# Patient Record
Sex: Male | Born: 1945 | Race: White | Hispanic: No | Marital: Single | Smoking: Former smoker
Health system: Southern US, Community
[De-identification: ages and names within clinical notes are randomized; demographics above are authoritative.]

## PROBLEM LIST (undated history)

## (undated) DIAGNOSIS — J449 Chronic obstructive pulmonary disease, unspecified: Secondary | ICD-10-CM

## (undated) DIAGNOSIS — Z9889 Other specified postprocedural states: Secondary | ICD-10-CM

## (undated) DIAGNOSIS — N183 Chronic kidney disease, stage 3 unspecified: Secondary | ICD-10-CM

## (undated) DIAGNOSIS — M1990 Unspecified osteoarthritis, unspecified site: Secondary | ICD-10-CM

## (undated) DIAGNOSIS — E0590 Thyrotoxicosis, unspecified without thyrotoxic crisis or storm: Secondary | ICD-10-CM

## (undated) DIAGNOSIS — T8859XA Other complications of anesthesia, initial encounter: Secondary | ICD-10-CM

## (undated) DIAGNOSIS — N400 Enlarged prostate without lower urinary tract symptoms: Secondary | ICD-10-CM

## (undated) DIAGNOSIS — K219 Gastro-esophageal reflux disease without esophagitis: Secondary | ICD-10-CM

## (undated) DIAGNOSIS — T7840XA Allergy, unspecified, initial encounter: Secondary | ICD-10-CM

## (undated) DIAGNOSIS — D649 Anemia, unspecified: Secondary | ICD-10-CM

## (undated) DIAGNOSIS — E785 Hyperlipidemia, unspecified: Secondary | ICD-10-CM

## (undated) DIAGNOSIS — R42 Dizziness and giddiness: Secondary | ICD-10-CM

## (undated) DIAGNOSIS — R112 Nausea with vomiting, unspecified: Secondary | ICD-10-CM

## (undated) DIAGNOSIS — Z87442 Personal history of urinary calculi: Secondary | ICD-10-CM

## (undated) DIAGNOSIS — I2510 Atherosclerotic heart disease of native coronary artery without angina pectoris: Secondary | ICD-10-CM

## (undated) DIAGNOSIS — I119 Hypertensive heart disease without heart failure: Secondary | ICD-10-CM

## (undated) DIAGNOSIS — T4145XA Adverse effect of unspecified anesthetic, initial encounter: Secondary | ICD-10-CM

## (undated) DIAGNOSIS — G629 Polyneuropathy, unspecified: Secondary | ICD-10-CM

## (undated) DIAGNOSIS — R011 Cardiac murmur, unspecified: Secondary | ICD-10-CM

## (undated) DIAGNOSIS — Z9289 Personal history of other medical treatment: Secondary | ICD-10-CM

## (undated) DIAGNOSIS — G4733 Obstructive sleep apnea (adult) (pediatric): Secondary | ICD-10-CM

## (undated) DIAGNOSIS — E119 Type 2 diabetes mellitus without complications: Secondary | ICD-10-CM

## (undated) HISTORY — PX: CARDIAC CATHETERIZATION: SHX172

## (undated) HISTORY — PX: CHOLECYSTECTOMY: SHX55

## (undated) HISTORY — PX: COLONOSCOPY: SHX174

## (undated) HISTORY — DX: Allergy, unspecified, initial encounter: T78.40XA

## (undated) HISTORY — DX: Polyneuropathy, unspecified: G62.9

## (undated) HISTORY — PX: OTHER SURGICAL HISTORY: SHX169

## (undated) HISTORY — DX: Type 2 diabetes mellitus without complications: E11.9

## (undated) HISTORY — DX: Anemia, unspecified: D64.9

---

## 1998-03-18 ENCOUNTER — Emergency Department (HOSPITAL_COMMUNITY): Admission: EM | Admit: 1998-03-18 | Discharge: 1998-03-18 | Payer: Self-pay | Admitting: Emergency Medicine

## 1998-05-07 ENCOUNTER — Ambulatory Visit (HOSPITAL_COMMUNITY): Admission: RE | Admit: 1998-05-07 | Discharge: 1998-05-07 | Payer: Self-pay | Admitting: *Deleted

## 1999-11-19 ENCOUNTER — Emergency Department (HOSPITAL_COMMUNITY): Admission: EM | Admit: 1999-11-19 | Discharge: 1999-11-19 | Payer: Self-pay | Admitting: Emergency Medicine

## 1999-11-19 ENCOUNTER — Encounter: Payer: Self-pay | Admitting: Emergency Medicine

## 1999-11-29 ENCOUNTER — Emergency Department (HOSPITAL_COMMUNITY): Admission: EM | Admit: 1999-11-29 | Discharge: 1999-11-29 | Payer: Self-pay | Admitting: Emergency Medicine

## 2000-12-20 ENCOUNTER — Encounter: Payer: Self-pay | Admitting: Family Medicine

## 2000-12-20 ENCOUNTER — Encounter: Admission: RE | Admit: 2000-12-20 | Discharge: 2000-12-20 | Payer: Self-pay | Admitting: Family Medicine

## 2001-01-10 ENCOUNTER — Encounter: Admission: RE | Admit: 2001-01-10 | Discharge: 2001-01-10 | Payer: Self-pay | Admitting: Family Medicine

## 2001-01-10 ENCOUNTER — Encounter: Payer: Self-pay | Admitting: Family Medicine

## 2001-01-14 ENCOUNTER — Encounter: Payer: Self-pay | Admitting: Family Medicine

## 2001-01-14 ENCOUNTER — Encounter: Admission: RE | Admit: 2001-01-14 | Discharge: 2001-01-14 | Payer: Self-pay | Admitting: Family Medicine

## 2001-02-08 ENCOUNTER — Encounter: Payer: Self-pay | Admitting: Family Medicine

## 2001-02-08 ENCOUNTER — Encounter: Admission: RE | Admit: 2001-02-08 | Discharge: 2001-02-08 | Payer: Self-pay | Admitting: Family Medicine

## 2001-05-03 ENCOUNTER — Encounter: Payer: Self-pay | Admitting: Family Medicine

## 2001-05-03 ENCOUNTER — Encounter: Admission: RE | Admit: 2001-05-03 | Discharge: 2001-05-03 | Payer: Self-pay | Admitting: Family Medicine

## 2002-02-22 ENCOUNTER — Encounter: Admission: RE | Admit: 2002-02-22 | Discharge: 2002-02-22 | Payer: Self-pay | Admitting: Family Medicine

## 2002-02-22 ENCOUNTER — Encounter: Payer: Self-pay | Admitting: Family Medicine

## 2003-02-05 ENCOUNTER — Encounter: Payer: Self-pay | Admitting: Neurology

## 2003-02-05 ENCOUNTER — Ambulatory Visit (HOSPITAL_COMMUNITY): Admission: RE | Admit: 2003-02-05 | Discharge: 2003-02-05 | Payer: Self-pay | Admitting: Neurology

## 2003-08-31 ENCOUNTER — Encounter: Admission: RE | Admit: 2003-08-31 | Discharge: 2003-08-31 | Payer: Self-pay | Admitting: Family Medicine

## 2005-02-10 ENCOUNTER — Ambulatory Visit (HOSPITAL_BASED_OUTPATIENT_CLINIC_OR_DEPARTMENT_OTHER): Admission: RE | Admit: 2005-02-10 | Discharge: 2005-02-10 | Payer: Self-pay | Admitting: *Deleted

## 2005-02-15 ENCOUNTER — Ambulatory Visit: Payer: Self-pay | Admitting: Internal Medicine

## 2005-05-07 ENCOUNTER — Emergency Department (HOSPITAL_COMMUNITY): Admission: EM | Admit: 2005-05-07 | Discharge: 2005-05-07 | Payer: Self-pay | Admitting: Emergency Medicine

## 2005-06-22 ENCOUNTER — Ambulatory Visit: Admission: RE | Admit: 2005-06-22 | Discharge: 2005-06-22 | Payer: Self-pay | Admitting: Family Medicine

## 2005-07-03 ENCOUNTER — Ambulatory Visit (HOSPITAL_COMMUNITY): Admission: RE | Admit: 2005-07-03 | Discharge: 2005-07-04 | Payer: Self-pay | Admitting: Surgery

## 2005-07-03 ENCOUNTER — Encounter (INDEPENDENT_AMBULATORY_CARE_PROVIDER_SITE_OTHER): Payer: Self-pay | Admitting: Specialist

## 2009-02-28 ENCOUNTER — Encounter: Admission: RE | Admit: 2009-02-28 | Discharge: 2009-02-28 | Payer: Self-pay | Admitting: Family Medicine

## 2009-08-01 ENCOUNTER — Emergency Department (HOSPITAL_COMMUNITY): Admission: EM | Admit: 2009-08-01 | Discharge: 2009-08-01 | Payer: Self-pay | Admitting: Emergency Medicine

## 2010-08-09 ENCOUNTER — Encounter: Payer: Self-pay | Admitting: Family Medicine

## 2010-10-05 LAB — CBC
HCT: 38.6 % — ABNORMAL LOW (ref 39.0–52.0)
Hemoglobin: 12.7 g/dL — ABNORMAL LOW (ref 13.0–17.0)
MCHC: 33 g/dL (ref 30.0–36.0)
MCV: 86.6 fL (ref 78.0–100.0)
RBC: 4.45 MIL/uL (ref 4.22–5.81)

## 2010-10-05 LAB — BASIC METABOLIC PANEL
CO2: 30 mEq/L (ref 19–32)
Chloride: 98 mEq/L (ref 96–112)
GFR calc Af Amer: >60 mL/min (ref >60)
Potassium: 4 mEq/L (ref 3.5–5.1)
Sodium: 140 mEq/L (ref 135–145)

## 2010-12-05 NOTE — Procedures (Signed)
NAME:  Shane Watson, Shane Watson NO.:  [id number]   MEDICAL RECORD NO.:  [id number]          PATIENT TYPE:  OUT   LOCATION:  SLEEP CENTER                 FACILITY:  [facility]   PHYSICIAN:  Clinton D. Annamaria Boots, M.D. DATE OF BIRTH:  May 09, 1946   DATE OF STUDY:  02/10/2005                              NOCTURNAL POLYSOMNOGRAM   REFERRING PHYSICIAN:  Nelson Chimes, MD.   INDICATION FOR STUDY:  Hypersomnia with sleep apnea.  Epworth Sleepiness  Score 21/24, BMI 39, weight 290 pounds.   SLEEP ARCHITECTURE:  Total sleep time 334 minutes with sleep efficiency 80%.  Stage I was 6%, stage II 82%, stages III and IV absent, REM 13% of total  sleep time.  Sleep latency 15 minutes, REM latency 107 minutes.  Awake after  sleep onset 17 minutes.  Arousal index 18.   RESPIRATORY DATA:  Respiratory disturbance index (RDI, AHI) 22 obstructive  events per hour indicating moderate obstructive sleep apnea/hypopnea  syndrome before CPAP.  This included 29 hypopneas before CPAP.  All events  were recorded while sleeping supine.  REM RDI 20.  CPAP was titrated to 12  CWP, RDI 2.8 per hour, using a medium Comfort Gel mask with heated  humidifier.   OXYGEN DATA:  Very loud snoring with oxygen desaturation to a nadir of 60%  before CPAP.  After CPAP control, oxygen saturation held 95 to 98% on room  air.   CARDIAC DATA:  Sinus bradycardia at 49 to 66 beats per minute.   MOVEMENT/PARASOMNIA:  A total of 209 limb jerks were recorded of which 22  were associated with arousal or awakening for a periodic limb movement with  arousal index of 3.9 per hour which was increased.  Bathroom x 1.   IMPRESSION/RECOMMENDATIONS:  1.  Moderate obstructive sleep apnea/hypopnea, RDI 22 per hour, with loud      snoring and oxygen desaturation to 60%.      1.  Successful CPAP titration 12 CWP, RDI 2.8 per hour, using a medium          Comfort Gel mask with heated          humidifier.  2.  Periodic limb movement with  arousal, 3.9 per hour.      Clinton D. Annamaria Boots, M.D.  Diplomat    CDY/MEDQ  D:  02/15/2005 13:44:47  T:  02/15/2005 18:36:56  Job:  [id number]   cc:   Nelson Chimes, MD  Fax: [phone]

## 2010-12-05 NOTE — Op Note (Signed)
NAME:  Shane Watson, Shane Watson NO.:  [id number]   MEDICAL RECORD NO.:  [id number]          PATIENT TYPE:  OIB   LOCATION:  [place]                         FACILITY:  [facility]   PHYSICIAN:  Imogene Burn. Georgette Dover, M.D. DATE OF BIRTH:  March 18, 1946   DATE OF PROCEDURE:  07/03/2005  DATE OF DISCHARGE:                                 OPERATIVE REPORT   PREOPERATIVE DIAGNOSES:  Symptomatic cholelithiasis.   POSTOPERATIVE DIAGNOSES:  Symptomatic cholelithiasis.   PROCEDURE:  Laparoscopic cholecystectomy.   SURGEON:  Imogene Burn. Georgette Dover, M.D.   ASSISTANT:  Kathrin Penner, M.D.   ANESTHESIA:  General endotracheal.   INDICATIONS FOR PROCEDURE:  The patient is a 65 year old white male with  obesity who presents with several weeks of episodic right upper quadrant  pain radiating through to his back and lower abdomen. CT scan showed  cholelithiasis but no evidence of cholecystitis. The pain was exacerbated by  eating and was associated with some belching and bloating. He was seen in  consultation and cholecystectomy was recommended.   DESCRIPTION OF PROCEDURE:  The patient was brought to the operating room and  placed in supine position on the operating room table. After an adequate  level of general endotracheal anesthesia was obtained, the patient's abdomen  was prepped with Betadine and draped in a sterile fashion. A timeout was  then taken to ensure the proper patient and proper procedure. The patient's  umbilicus was infiltrated with 0.5% Marcaine. A curvilinear incision was  made just above the umbilicus. Dissection was carried down to the fascia  which was grasped with Kocher clamps. The fascia was opened in a vertical  fashion. The peritoneum was entered bluntly. A stay suture of #0 Vicryl was  placed in pursestring fashion and the Hasson cannula was inserted.  Pneumoperitoneum was obtained by insufflating CO2 maintaining a maximum  pressure of 15 mmHg. A 10 mm port was placed in the  subxiphoid position  under direct vision. Two 5 mm ports were placed in the right upper quadrant.  The patient was rotated in reverse Trendelenburg position, rotated slightly  to his left. The gallbladder was visualized. There was some adherent omentum  to the liver and gallbladder. These adhesions were taken down with the  cautery. The gallbladder was grasped, clamped and elevated over the edge of  the liver. The peritoneum at the hilum of the gallbladder was opened with  cautery. The cystic duct was circumferentially dissected. The cystic artery  was also noted and appeared to be branching across in front of the cystic  duct. This small branch was ligated with clips and divided. The cystic duct  was ligated distally with a clip. A small opening was made on the surface of  the cystic duct. A Reddick catheter was brought through a separate stab  incision. Several attempts were made to pass the catheter but there appeared  to be a large valve that was preventing passage of the catheter. We then  switched to a [facility] cholangiogram catheter. Once again we were unable to pass  this. Good bile flow was noted coming retrograde out of  the cystic duct. The  cystic duct was palpated with the dissector and no stones were noted. The  patient's preoperative liver function tests were normal so the decision was  made to abort the cholangiogram. The cystic duct was then ligated proximally  with 3 clips and then divided. The cystic artery was ligated with clips and  divided. A small posterior branch was also ligated with clips and divided.  Cautery was then used to dissect the gallbladder away from the liver bed.  The gallbladder was placed in an EndoCatch sac and then removed through the  umbilical port. The right upper quadrant was then thoroughly irrigated. No  bleeding or bile leak was noted. The ports were removed under direct vision  and pneumoperitoneum was released. The pursestring suture was used to  close  the umbilical fascia. 4-0 Monocryl was used to close the skin in  subcuticular fashion. Steri-Strips and clean dressings were applied. The  patient was extubated and brought to the recovery room in stable condition.      Imogene Burn. Tsuei, M.D.  Electronically Signed     MKT/MEDQ  D:  07/03/2005  T:  07/03/2005  Job:  [id number]

## 2010-12-18 ENCOUNTER — Inpatient Hospital Stay (HOSPITAL_COMMUNITY)
Admission: RE | Admit: 2010-12-18 | Discharge: 2010-12-19 | DRG: 247 | Disposition: A | Discharge status: Home / Self Care | Payer: Medicare Other | Source: Ambulatory Visit | Attending: Cardiology | Admitting: Cardiology

## 2010-12-18 DIAGNOSIS — E785 Hyperlipidemia, unspecified: Secondary | ICD-10-CM | POA: Diagnosis present

## 2010-12-18 DIAGNOSIS — Z7982 Long term (current) use of aspirin: Secondary | ICD-10-CM

## 2010-12-18 DIAGNOSIS — G4733 Obstructive sleep apnea (adult) (pediatric): Secondary | ICD-10-CM | POA: Diagnosis present

## 2010-12-18 DIAGNOSIS — Z7902 Long term (current) use of antithrombotics/antiplatelets: Secondary | ICD-10-CM

## 2010-12-18 DIAGNOSIS — I2510 Atherosclerotic heart disease of native coronary artery without angina pectoris: Principal | ICD-10-CM | POA: Diagnosis present

## 2010-12-18 DIAGNOSIS — I10 Essential (primary) hypertension: Secondary | ICD-10-CM | POA: Diagnosis present

## 2010-12-18 DIAGNOSIS — E876 Hypokalemia: Secondary | ICD-10-CM | POA: Diagnosis present

## 2010-12-18 HISTORY — PX: OTHER SURGICAL HISTORY: SHX169

## 2010-12-18 LAB — POCT ACTIVATED CLOTTING TIME: Activated Clotting Time: 523 seconds

## 2010-12-18 LAB — CARDIAC PANEL(CRET KIN+CKTOT+MB+TROPI)
Relative Index: 8.6 — ABNORMAL HIGH (ref 0.0–2.5)
Troponin I: 3.65 ng/mL (ref <0.30)

## 2010-12-18 LAB — TROPONIN I: Troponin I: <0.3 ng/mL (ref <0.30)

## 2010-12-19 LAB — CBC
HCT: 36.8 % — ABNORMAL LOW (ref 39.0–52.0)
MCHC: 32.6 g/dL (ref 30.0–36.0)
RDW: 13.9 % (ref 11.5–15.5)

## 2010-12-19 LAB — BASIC METABOLIC PANEL
BUN: 12 mg/dL (ref 6–23)
Calcium: 8.4 mg/dL (ref 8.4–10.5)
GFR calc non Af Amer: 55 mL/min — ABNORMAL LOW (ref >60)
Glucose, Bld: 132 mg/dL — ABNORMAL HIGH (ref 70–99)
Sodium: 139 mEq/L (ref 135–145)

## 2010-12-19 NOTE — Cardiovascular Report (Signed)
NAME:  Shane Watson, Shane Watson NO.:  [id number]  MEDICAL RECORD NO.:  [id number]           PATIENT TYPE:  O  LOCATION:  2922                         FACILITY:  [facility]  PHYSICIAN:  Jerline Pain, MD      DATE OF BIRTH:  [birth year]  DATE OF PROCEDURE:  12/18/2010 DATE OF DISCHARGE:                           CARDIAC CATHETERIZATION   PROCEDURES: 1. Radial artery approach left heart catheterization. 2. Left ventriculogram. 3. Selective coronary angiography.  INDICATIONS:  A 65 year old male with morbid obesity, obstructive sleep apnea who underwent nuclear stress test which demonstrated an inferior wall perfusion defect as well as anteroseptal wall reversible change with possible ischemia.  There was hypokinesis along the inferior wall. Echo EF was normal.  PROCEDURE DETAILS:  Informed consent was obtained.  Risk of stroke, heart attack, death, renal impairment, arterial damage, bleeding were explained to the patient at length.  Ample time for questioning. Alternative treatments were discussed.  A 5-French hydrophilic sheath was inserted in to the right radial artery after 1% lidocaine was used for local anesthesia.  Allen test was normal pre and post procedure.  A Wholey wire was used to traverse the aortic arch.  A Judkins right catheter was performed.  A Judkins left 3.5 catheter was used to cannulate the left main artery.  Multiple views with hand injection of Omnipaque were obtained.  Angled pigtail was used to crossing the left ventricle and hemodynamics were performed.  Hand injection was used for left ventriculogram in the RAO position.  Following the procedure, the sheath was upsized to 6-French for percutaneous intervention.  A 200 mcg of nitroglycerin was administered at that point and initial sheath insertion was verapamil 3 mg.  Heparin 4000 units was administered once catheter was around aortic arch.  FINDINGS: 1. Left main artery was widely patent,  gives rise to the LAD and     circumflex.  No angiographically significant disease. 2. LAD.  There is a long area of significant stenosis of up to 90%     just after the first septal branch in the mid LAD.  There is a     diagonal branch that is proximal to this lesion.  Quite significant     with possible thrombus burden within.  There are 2 focal areas of     90% stenosis.  LAD then wraps around the apex. 3. Circumflex artery.  There are 3 obtuse marginal branches, large     diameter vessels.  No angiographically significant disease.  Right     coronary artery is also with mild disease in the proximal region of     20-30%, gives rise to the posterior descending artery.  Left     ventriculogram was performed and was suboptimal due to hand     injection.  Echocardiogram demonstrated normal EF.  Basal inferior     wall abnormality.  IMPRESSIONS: 1. Severe left anterior descending coronary artery stenosis of 90%     long lesion. 2. Suboptimal left ventriculogram, however, echocardiogram     demonstrated normal function.  Left ventricular end-diastolic     pressure 26 mmHg, systolic  pressure 134/15, aortic pressure is     134/80 with a mean of 102.  PLAN:  Findings discussed with him and Dr. Daneen Schick who will perform percutaneous intervention to the LAD.  We will hold him for observation overnight.  He did have an allergy listed to ASPIRIN, questionable reaction, perhaps tingling of his lips, possibility of angioedema, however, he did receive aspirin earlier this morning and did not have any issues with it currently.  We will go ahead and administer Plavix, however.  While he is here during observation, may go ahead and continue with aspirin.     Jerline Pain, MD     MCS/MEDQ  D:  12/18/2010  T:  12/19/2010  Job:  [id number]  cc:   Marchia Bond, M.D.  Electronically Signed by Candee Furbish MD on 12/19/2010 06:45:25 AM

## 2010-12-22 NOTE — Discharge Summary (Signed)
NAME:  Shane Watson, Shane Watson NO.:  [id number]  MEDICAL RECORD NO.:  [id number]           PATIENT TYPE:  I  LOCATION:  [id number]                         FACILITY:  [facility]  PHYSICIAN:  Jerline Pain, MD      DATE OF BIRTH:  July 16, 1946  DATE OF ADMISSION:  12/18/2010 DATE OF DISCHARGE:  12/19/2010                              DISCHARGE SUMMARY   FINAL DIAGNOSES: 1. Coronary artery disease - cardiac catheterization revealed a severe     90% to 95% sequential, proximal/mid left anterior descending     stenosis.  Dr. Daneen Schick placed two drug-eluting stents to this     region, Resolute stent 3.5 mm x 22 mm and a 3.5 mm x 12 mm.  The     diagonal branch was covered and demonstrated 0-1 TIMI flow     following the procedure and he had postprocedural elevation in     cardiac markers.  He did have some chest discomfort following the     procedure which has slowly weaned away.  He has ambulated the     hallway well without difficulty.  He has seen cardiac     rehabilitation and has also ambulated well without difficulty.  I     have monitored him on telemetry and he demonstrated no evidence of     any adverse arrhythmias.  He has done very well.  No chest pain.     After discussion with he and his wife, we all concluded that it was     safe for him to be discharged home.  Of course if he has any     significant symptoms, he knows to seek medical attention     immediately. 2. Obesity - encourage weight loss.  Morbidly obese.  He will be going     to Cardiac Rehab for dietary counseling. 3. Obstructive sleep apnea - CPAP. 4. Hyperlipidemia - in addition to his WelChol which he is actually     taking for loose stools, I will go ahead and place him on     atorvastatin 40 mg once a day.  We will recheck a cholesterol panel     in 2 months. 5. Hypertension - currently well controlled on current medications. 6. Hypokalemia - his potassiums usually ran approximately 3.4-3.5.  I     will  go ahead and give him potassium supplementation 20 mEq to be     taken at home.  FOLLOWUP:  I will obtain followup with him in 1 week with Bennye Alm and myself.  DISCHARGE LABORATORY DATA:  BUN 12, creatinine 1.3.  Cardiac markers postprocedure were CK of 343, MB of 29.5, and troponin of 3.65. Hemoglobin 12.0, hematocrit 36.8, platelet count 110.  In regard to his platelet count, we will continue to monitor with CBC.  His platelet count prior to procedure was 157.  Certainly, bivalirudin may be the culprit for decreased platelets.  We will recheck.  He is showing no signs of bleeding.  DISCHARGE MEDICATIONS: 1. Atorvastatin 40 mg once a day. 2. Aspirin 325 mg once a day. 3. Clopidogrel 75  mg once a day. 4. Isosorbide mononitrate 30 mg once a day. 5. Metoprolol tartrate 12.5 mg twice a day. 6. Nitroglycerin sublingual as needed chest pain. 7. Potassium chloride 20 mEq once a day. 8. Doxazosin 8 mg at bedtime. 9. Fish oil daily. 10.Losartan 50 mg once a day. 11.Multivitamin once a day. 12.Triamterene/hydrochlorothiazide 75/50 mg once a day. 13.Albuterol inhaler p.r.n. 14.WelChol 625 mg 1 tablet three times a day as previously prescribed.  He did have a prior ASPIRIN allergy listed as angioedema; however, he has tolerated his ASPIRIN for the past 2 days very well without any difficulty.  We should remove the ASPIRIN allergy from his medication allergies.  PHYSICAL EXAMINATION ON DISCHARGE:  A 2+ radial pulse.  No ecchymosis. His heart was regular rate and rhythm without any murmurs, rubs, or gallops.  Lungs were clear to auscultation.  Abdomen was obese. Positive bowel sounds.  He was ambulating well.  No significant edema. Once again, both he and his wife were comfortable with discharge and know to seek medical attention if needed.  Thirty five minutes spent on discharge with med reconciliation, patient instruction, review of medical records.     Jerline Pain,  MD     MCS/MEDQ  D:  12/19/2010  T:  12/20/2010  Job:  [id number]  Electronically Signed by Candee Furbish MD on 12/22/2010 06:24:53 AM

## 2011-01-12 ENCOUNTER — Encounter (HOSPITAL_COMMUNITY)
Admission: RE | Admit: 2011-01-12 | Discharge: 2011-01-12 | Disposition: A | Discharge status: Home / Self Care | Payer: Medicare Other | Source: Ambulatory Visit | Attending: Cardiology | Admitting: Cardiology

## 2011-01-12 DIAGNOSIS — E785 Hyperlipidemia, unspecified: Secondary | ICD-10-CM | POA: Insufficient documentation

## 2011-01-12 DIAGNOSIS — I2510 Atherosclerotic heart disease of native coronary artery without angina pectoris: Secondary | ICD-10-CM | POA: Insufficient documentation

## 2011-01-12 DIAGNOSIS — Z9861 Coronary angioplasty status: Secondary | ICD-10-CM | POA: Insufficient documentation

## 2011-01-12 DIAGNOSIS — Z7982 Long term (current) use of aspirin: Secondary | ICD-10-CM | POA: Insufficient documentation

## 2011-01-12 DIAGNOSIS — I10 Essential (primary) hypertension: Secondary | ICD-10-CM | POA: Insufficient documentation

## 2011-01-12 DIAGNOSIS — G4733 Obstructive sleep apnea (adult) (pediatric): Secondary | ICD-10-CM | POA: Insufficient documentation

## 2011-01-12 DIAGNOSIS — Z5189 Encounter for other specified aftercare: Secondary | ICD-10-CM | POA: Insufficient documentation

## 2011-01-12 DIAGNOSIS — Z7902 Long term (current) use of antithrombotics/antiplatelets: Secondary | ICD-10-CM | POA: Insufficient documentation

## 2011-01-14 ENCOUNTER — Other Ambulatory Visit: Payer: Self-pay | Admitting: Cardiology

## 2011-01-14 ENCOUNTER — Encounter (HOSPITAL_COMMUNITY): Payer: Medicare Other

## 2011-01-16 ENCOUNTER — Encounter (HOSPITAL_COMMUNITY): Payer: Medicare Other

## 2011-01-19 ENCOUNTER — Encounter (HOSPITAL_COMMUNITY): Payer: Medicare Other | Attending: Cardiology

## 2011-01-19 DIAGNOSIS — Z7982 Long term (current) use of aspirin: Secondary | ICD-10-CM | POA: Insufficient documentation

## 2011-01-19 DIAGNOSIS — E785 Hyperlipidemia, unspecified: Secondary | ICD-10-CM | POA: Insufficient documentation

## 2011-01-19 DIAGNOSIS — G4733 Obstructive sleep apnea (adult) (pediatric): Secondary | ICD-10-CM | POA: Insufficient documentation

## 2011-01-19 DIAGNOSIS — I10 Essential (primary) hypertension: Secondary | ICD-10-CM | POA: Insufficient documentation

## 2011-01-19 DIAGNOSIS — I2510 Atherosclerotic heart disease of native coronary artery without angina pectoris: Secondary | ICD-10-CM | POA: Insufficient documentation

## 2011-01-19 DIAGNOSIS — Z9861 Coronary angioplasty status: Secondary | ICD-10-CM | POA: Insufficient documentation

## 2011-01-19 DIAGNOSIS — Z7902 Long term (current) use of antithrombotics/antiplatelets: Secondary | ICD-10-CM | POA: Insufficient documentation

## 2011-01-19 DIAGNOSIS — Z5189 Encounter for other specified aftercare: Secondary | ICD-10-CM | POA: Insufficient documentation

## 2011-01-21 ENCOUNTER — Encounter (HOSPITAL_COMMUNITY): Payer: Medicare Other

## 2011-01-22 NOTE — Cardiovascular Report (Signed)
NAME:  Shane Watson, Shane Watson NO.:  [id number]  MEDICAL RECORD NO.:  [id number]           PATIENT TYPE:  O  LOCATION:  2922                         FACILITY:  [facility]  PHYSICIAN:  Belva Crome, M.D.   DATE OF BIRTH:  1946/02/24  DATE OF PROCEDURE:  12/18/2010 DATE OF DISCHARGE:                           CARDIAC CATHETERIZATION   INDICATION FOR THE PROCEDURE:  Abnormal nuclear study with anteroseptal ischemia and exertional dyspnea.  Catheterization today demonstrated severe complex-appearing mid LAD lesion just beyond the first septal perforator and diagonal obstructing flow by up to 98%.  TIMI flow is grade 2-1/2 to 3.  PROCEDURE PERFORMED:  Overlapping mid left anterior descending drug- eluting stents via the right radial.  DESCRIPTION:  Dr. [name] performed diagnostic catheterization via the right radial.  After identifying the lesion, he upgraded the sheath to 6- [place].  We reviewed the digital images in the catheterization laboratory and decided to proceed with PCI.  An XB LAD 3.0 cm 6-French catheter was used as a guide.  We used an Landscape architect to cross the stenosis in the LAD.  We did balloon inflation with a 2.0 x 15 mm long apex balloon.  Overlapping balloon inflations were performed.  We then used the clear way aspiration catheter to perform suction thrombectomy as the lesion appeared to contain bulky thrombus.  We were not able to extract any thrombus.  We then further predilated using a 3.5 x 15 apex balloon inflated to 10 atmospheres in an overlapping fashion.  We then deployed a 3.5 x 22 mm Resolute drug-eluting stent.  Due to severe stent movement with breathing, the 22-mm long stent moved just at the time that the inflation occurred and caused [place] to miss the distal portion of the lesion.  We then used a second 3.5 x 12 mm Resolute stent to cover the remainder of the lesion overlapping it with the initial stent.  14 atmospheres of pressure  were used to deploy each stent.  We then used cinefluoroscopy to properly place and postdilate using a 4.0 x 20 mm long Quantum [place] balloon.  15 atmospheres of pressure was used.  Two inflations were performed in the distal three-fourth of the stent and in the proximal three-fourth of the stent.  Post dilatation, the lesion was completely ablated with 0% stenosis being noted.  A small second diagonal, which was jailed by the stent had TIMI grade 1 flow and was a source of low-grade continued chest pain even at the completion of the case.  Intravenous nitroglycerin at 10 mcg per minute was started, and there was gradual progressive resolution of discomfort even before the patient could be taken to the holding area.  The patient received 600 mg of Plavix and/or IV Cangrelor as part of the [place] trial.  The arterial sheath was removed and a wristband applied. The antithrombotic for the case was a bolus followed by infusion of Angiomax.  Angiomax was discontinued at the completion of the case.  CONCLUSION:  Successful stenting of the mid LAD from 99% to 0% with TIMI grade 3 flow.  A small second diagonal has  reduced flow due to ostial spasm caused by jailing the branch.  This seems to clinically be improving with resolving chest pain.  TIMI grade 3 flow was noted throughout the LAD otherwise.  PLAN:  Per Dr. [name], aspirin and Plavix should be continued for at least 12 months.     Belva Crome, M.D.     HWS/MEDQ  D:  12/18/2010  T:  12/19/2010  Job:  [id number]  cc:   Azalia Bilis, M.D. Jerline Pain, MD  Electronically Signed by Daneen Schick M.D. on 01/22/2011 01:35:39 PM

## 2011-01-23 ENCOUNTER — Encounter (HOSPITAL_COMMUNITY): Payer: Medicare Other

## 2011-01-26 ENCOUNTER — Encounter (HOSPITAL_COMMUNITY): Payer: Medicare Other

## 2011-01-28 ENCOUNTER — Encounter (HOSPITAL_COMMUNITY): Payer: Medicare Other

## 2011-01-30 ENCOUNTER — Encounter (HOSPITAL_COMMUNITY): Payer: Medicare Other

## 2011-02-02 ENCOUNTER — Encounter (HOSPITAL_COMMUNITY): Payer: Medicare Other

## 2011-02-04 ENCOUNTER — Encounter (HOSPITAL_COMMUNITY): Payer: Medicare Other

## 2011-02-06 ENCOUNTER — Encounter (HOSPITAL_COMMUNITY): Payer: Medicare Other

## 2011-02-09 ENCOUNTER — Encounter (HOSPITAL_COMMUNITY): Payer: Medicare Other

## 2011-02-11 ENCOUNTER — Encounter (HOSPITAL_COMMUNITY): Payer: Medicare Other

## 2011-02-13 ENCOUNTER — Encounter (HOSPITAL_COMMUNITY): Payer: Medicare Other

## 2011-02-16 ENCOUNTER — Encounter (HOSPITAL_COMMUNITY): Payer: Medicare Other

## 2011-02-18 ENCOUNTER — Encounter (HOSPITAL_COMMUNITY): Payer: Medicare Other | Attending: Cardiology

## 2011-02-18 DIAGNOSIS — Z7902 Long term (current) use of antithrombotics/antiplatelets: Secondary | ICD-10-CM | POA: Insufficient documentation

## 2011-02-18 DIAGNOSIS — I10 Essential (primary) hypertension: Secondary | ICD-10-CM | POA: Insufficient documentation

## 2011-02-18 DIAGNOSIS — Z5189 Encounter for other specified aftercare: Secondary | ICD-10-CM | POA: Insufficient documentation

## 2011-02-18 DIAGNOSIS — G4733 Obstructive sleep apnea (adult) (pediatric): Secondary | ICD-10-CM | POA: Insufficient documentation

## 2011-02-18 DIAGNOSIS — E785 Hyperlipidemia, unspecified: Secondary | ICD-10-CM | POA: Insufficient documentation

## 2011-02-18 DIAGNOSIS — I2510 Atherosclerotic heart disease of native coronary artery without angina pectoris: Secondary | ICD-10-CM | POA: Insufficient documentation

## 2011-02-18 DIAGNOSIS — Z7982 Long term (current) use of aspirin: Secondary | ICD-10-CM | POA: Insufficient documentation

## 2011-02-18 DIAGNOSIS — Z9861 Coronary angioplasty status: Secondary | ICD-10-CM | POA: Insufficient documentation

## 2011-02-20 ENCOUNTER — Encounter (HOSPITAL_COMMUNITY): Payer: Medicare Other

## 2011-02-23 ENCOUNTER — Encounter (HOSPITAL_COMMUNITY): Payer: Medicare Other

## 2011-02-25 ENCOUNTER — Encounter (HOSPITAL_COMMUNITY): Payer: Medicare Other

## 2011-02-27 ENCOUNTER — Encounter (HOSPITAL_COMMUNITY): Payer: Medicare Other

## 2011-03-02 ENCOUNTER — Encounter (HOSPITAL_COMMUNITY): Payer: Medicare Other

## 2011-03-04 ENCOUNTER — Encounter (HOSPITAL_COMMUNITY): Payer: Medicare Other

## 2011-03-06 ENCOUNTER — Encounter (HOSPITAL_COMMUNITY): Payer: Medicare Other

## 2011-03-09 ENCOUNTER — Encounter (HOSPITAL_COMMUNITY): Payer: Medicare Other

## 2011-03-11 ENCOUNTER — Encounter (HOSPITAL_COMMUNITY): Payer: Medicare Other

## 2011-03-13 ENCOUNTER — Encounter (HOSPITAL_COMMUNITY): Payer: Medicare Other

## 2011-03-16 ENCOUNTER — Encounter (HOSPITAL_COMMUNITY): Payer: Medicare Other

## 2011-03-18 ENCOUNTER — Encounter (HOSPITAL_COMMUNITY): Payer: Medicare Other

## 2011-03-20 ENCOUNTER — Encounter (HOSPITAL_COMMUNITY): Payer: Medicare Other

## 2011-03-23 ENCOUNTER — Encounter (HOSPITAL_COMMUNITY): Payer: Medicare Other

## 2011-03-25 ENCOUNTER — Encounter (HOSPITAL_COMMUNITY): Payer: Medicare Other | Attending: Cardiology

## 2011-03-25 DIAGNOSIS — E785 Hyperlipidemia, unspecified: Secondary | ICD-10-CM | POA: Insufficient documentation

## 2011-03-25 DIAGNOSIS — G4733 Obstructive sleep apnea (adult) (pediatric): Secondary | ICD-10-CM | POA: Insufficient documentation

## 2011-03-25 DIAGNOSIS — Z7982 Long term (current) use of aspirin: Secondary | ICD-10-CM | POA: Insufficient documentation

## 2011-03-25 DIAGNOSIS — I2510 Atherosclerotic heart disease of native coronary artery without angina pectoris: Secondary | ICD-10-CM | POA: Insufficient documentation

## 2011-03-25 DIAGNOSIS — Z5189 Encounter for other specified aftercare: Secondary | ICD-10-CM | POA: Insufficient documentation

## 2011-03-25 DIAGNOSIS — Z9861 Coronary angioplasty status: Secondary | ICD-10-CM | POA: Insufficient documentation

## 2011-03-25 DIAGNOSIS — Z7902 Long term (current) use of antithrombotics/antiplatelets: Secondary | ICD-10-CM | POA: Insufficient documentation

## 2011-03-25 DIAGNOSIS — I10 Essential (primary) hypertension: Secondary | ICD-10-CM | POA: Insufficient documentation

## 2011-03-27 ENCOUNTER — Encounter (HOSPITAL_COMMUNITY): Payer: Medicare Other

## 2011-03-30 ENCOUNTER — Encounter (HOSPITAL_COMMUNITY): Payer: Medicare Other

## 2011-04-01 ENCOUNTER — Encounter (HOSPITAL_COMMUNITY): Payer: Medicare Other

## 2011-04-03 ENCOUNTER — Encounter (HOSPITAL_COMMUNITY): Payer: Medicare Other

## 2011-04-06 ENCOUNTER — Encounter (HOSPITAL_COMMUNITY): Payer: Medicare Other

## 2011-04-08 ENCOUNTER — Encounter (HOSPITAL_COMMUNITY): Payer: Medicare Other

## 2011-04-10 ENCOUNTER — Encounter (HOSPITAL_COMMUNITY): Payer: Medicare Other

## 2011-04-13 ENCOUNTER — Encounter (HOSPITAL_COMMUNITY): Payer: Medicare Other

## 2011-04-15 ENCOUNTER — Encounter (HOSPITAL_COMMUNITY): Payer: Medicare Other

## 2011-04-17 ENCOUNTER — Encounter (HOSPITAL_COMMUNITY): Payer: Medicare Other

## 2011-07-17 ENCOUNTER — Encounter: Payer: Self-pay | Admitting: Emergency Medicine

## 2011-07-17 ENCOUNTER — Emergency Department (INDEPENDENT_AMBULATORY_CARE_PROVIDER_SITE_OTHER)
Admission: EM | Admit: 2011-07-17 | Discharge: 2011-07-17 | Disposition: A | Discharge status: Home / Self Care | Payer: Medicare Other | Source: Home / Self Care | Attending: Family Medicine | Admitting: Family Medicine

## 2011-07-17 DIAGNOSIS — J45901 Unspecified asthma with (acute) exacerbation: Secondary | ICD-10-CM

## 2011-07-17 DIAGNOSIS — J019 Acute sinusitis, unspecified: Secondary | ICD-10-CM

## 2011-07-17 MED ORDER — ALBUTEROL SULFATE (5 MG/ML) 0.5% IN NEBU
5.0000 mg | INHALATION_SOLUTION | Freq: Once | RESPIRATORY_TRACT | Status: AC
  Administered 2011-07-17: 5 mg via RESPIRATORY_TRACT

## 2011-07-17 MED ORDER — ALBUTEROL SULFATE (5 MG/ML) 0.5% IN NEBU
INHALATION_SOLUTION | RESPIRATORY_TRACT | Status: AC
  Filled 2011-07-17: qty 1

## 2011-07-17 MED ORDER — HYDROCOD POLST-CHLORPHEN POLST 10-8 MG/5ML PO LQCR: 5.0000 mL | Freq: Two times a day (BID) | ORAL | Status: DC

## 2011-07-17 MED ORDER — AMOXICILLIN-POT CLAVULANATE 875-125 MG PO TABS: 1.0000 | ORAL_TABLET | Freq: Two times a day (BID) | ORAL | Status: AC

## 2011-07-17 MED ORDER — IPRATROPIUM BROMIDE 0.02 % IN SOLN
0.5000 mg | Freq: Once | RESPIRATORY_TRACT | Status: AC
  Administered 2011-07-17: 0.5 mg via RESPIRATORY_TRACT

## 2011-07-17 NOTE — ED Provider Notes (Signed)
History     CSN: [id number]  Arrival date & time 07/17/11  1026   First MD Initiated Contact with Patient 07/17/11 1057      Chief Complaint  Patient presents with  . Cough    (Consider location/radiation/quality/duration/timing/severity/associated sxs/prior treatment) Patient is a 65 y.o. male presenting with cough. The history is provided by the patient.  Cough This is a new problem. The current episode started more than 1 week ago. The problem has not changed since onset.The cough is productive of sputum. There has been no fever. Associated symptoms include rhinorrhea, myalgias and wheezing. Pertinent negatives include no chills, no sweats and no sore throat. He is not a smoker.    History reviewed. No pertinent past medical history.  History reviewed. No pertinent past surgical history.  History reviewed. No pertinent family history.  History  Substance Use Topics  . Smoking status: Not on file  . Smokeless tobacco: Not on file  . Alcohol Use: Not on file      Review of Systems  Constitutional: Negative for fever and chills.  HENT: Positive for congestion and rhinorrhea. Negative for sore throat.   Respiratory: Positive for cough and wheezing.   Musculoskeletal: Positive for myalgias.    Allergies  Review of patient's allergies indicates no known allergies.  Home Medications   Current Outpatient Rx  Name Route Sig Dispense Refill  . AMOXICILLIN-POT CLAVULANATE 875-125 MG PO TABS Oral Take 1 tablet by mouth 2 (two) times daily. 20 tablet 0  . HYDROCOD POLST-CHLORPHEN POLST 10-8 MG/5ML PO LQCR Oral Take 5 mLs by mouth every 12 (twelve) hours. 115 mL 0    BP 153/69  Pulse 68  Temp(Src) 98.8 F (37.1 C) (Oral)  Resp 20  SpO2 98%  Physical Exam  Nursing note and vitals reviewed. Constitutional: He appears well-developed and well-nourished.  HENT:  Head: Normocephalic.  Right Ear: External ear and ear canal normal. Tympanic membrane is scarred and  retracted.  Left Ear: External ear and ear canal normal. Tympanic membrane is scarred and retracted.  Nose: Mucosal edema and rhinorrhea present.  Mouth/Throat: Oropharynx is clear and moist.  Neck: Normal range of motion. Neck supple.  Cardiovascular: Normal rate, normal heart sounds and intact distal pulses.   Pulmonary/Chest: Effort normal. He has wheezes. He has no rales.  Lymphadenopathy:    He has no cervical adenopathy.  Skin: Skin is warm and dry.    ED Course  Procedures (including critical care time)  Labs Reviewed - No data to display No results found.   1. Sinusitis acute   2. Asthma with exacerbation       MDM   Sx improved after neb       Pauline Good, MD 07/17/11 1151

## 2011-07-17 NOTE — ED Notes (Signed)
Pt c/o cough and congestion that started about 1 week ago. No aches, fever, or sore throat. Pt called PCP about cough and was given Benzonate, pt states it did not help much and is getting progressively worse.

## 2011-09-28 DIAGNOSIS — E291 Testicular hypofunction: Secondary | ICD-10-CM | POA: Diagnosis not present

## 2011-10-02 DIAGNOSIS — I2510 Atherosclerotic heart disease of native coronary artery without angina pectoris: Secondary | ICD-10-CM | POA: Diagnosis not present

## 2011-10-02 DIAGNOSIS — Z0389 Encounter for observation for other suspected diseases and conditions ruled out: Secondary | ICD-10-CM | POA: Diagnosis not present

## 2011-10-27 ENCOUNTER — Ambulatory Visit
Admission: RE | Admit: 2011-10-27 | Discharge: 2011-10-27 | Disposition: A | Discharge status: Home / Self Care | Payer: Medicare Other | Source: Ambulatory Visit | Attending: Family Medicine | Admitting: Family Medicine

## 2011-10-27 ENCOUNTER — Other Ambulatory Visit: Payer: Self-pay | Admitting: Family Medicine

## 2011-10-27 DIAGNOSIS — N201 Calculus of ureter: Secondary | ICD-10-CM | POA: Diagnosis not present

## 2011-10-27 DIAGNOSIS — R109 Unspecified abdominal pain: Secondary | ICD-10-CM | POA: Diagnosis not present

## 2011-10-27 DIAGNOSIS — N200 Calculus of kidney: Secondary | ICD-10-CM | POA: Diagnosis not present

## 2011-10-27 DIAGNOSIS — N1330 Unspecified hydronephrosis: Secondary | ICD-10-CM | POA: Diagnosis not present

## 2011-10-27 DIAGNOSIS — R1030 Lower abdominal pain, unspecified: Secondary | ICD-10-CM

## 2011-10-27 MED ORDER — IOHEXOL 300 MG/ML  SOLN
30.0000 mL | Freq: Once | INTRAMUSCULAR | Status: AC | PRN
  Administered 2011-10-27: 30 mL via ORAL

## 2011-10-27 MED ORDER — IOHEXOL 300 MG/ML  SOLN
125.0000 mL | Freq: Once | INTRAMUSCULAR | Status: AC | PRN
  Administered 2011-10-27: 125 mL via INTRAVENOUS

## 2011-11-12 DIAGNOSIS — I10 Essential (primary) hypertension: Secondary | ICD-10-CM | POA: Diagnosis not present

## 2011-11-12 DIAGNOSIS — M549 Dorsalgia, unspecified: Secondary | ICD-10-CM | POA: Diagnosis not present

## 2011-11-12 DIAGNOSIS — E291 Testicular hypofunction: Secondary | ICD-10-CM | POA: Diagnosis not present

## 2011-11-12 DIAGNOSIS — E785 Hyperlipidemia, unspecified: Secondary | ICD-10-CM | POA: Diagnosis not present

## 2011-11-12 DIAGNOSIS — Z0000 Encounter for general adult medical examination without abnormal findings: Secondary | ICD-10-CM | POA: Diagnosis not present

## 2011-11-12 DIAGNOSIS — L2089 Other atopic dermatitis: Secondary | ICD-10-CM | POA: Diagnosis not present

## 2011-11-12 DIAGNOSIS — R197 Diarrhea, unspecified: Secondary | ICD-10-CM | POA: Diagnosis not present

## 2011-12-15 DIAGNOSIS — E291 Testicular hypofunction: Secondary | ICD-10-CM | POA: Diagnosis not present

## 2012-01-12 DIAGNOSIS — E291 Testicular hypofunction: Secondary | ICD-10-CM | POA: Diagnosis not present

## 2012-02-06 DIAGNOSIS — L03319 Cellulitis of trunk, unspecified: Secondary | ICD-10-CM | POA: Diagnosis not present

## 2012-02-09 DIAGNOSIS — L02219 Cutaneous abscess of trunk, unspecified: Secondary | ICD-10-CM | POA: Diagnosis not present

## 2012-02-09 DIAGNOSIS — E291 Testicular hypofunction: Secondary | ICD-10-CM | POA: Diagnosis not present

## 2012-03-08 DIAGNOSIS — E291 Testicular hypofunction: Secondary | ICD-10-CM | POA: Diagnosis not present

## 2012-03-17 DIAGNOSIS — J209 Acute bronchitis, unspecified: Secondary | ICD-10-CM | POA: Diagnosis not present

## 2012-04-05 DIAGNOSIS — E291 Testicular hypofunction: Secondary | ICD-10-CM | POA: Diagnosis not present

## 2012-04-05 DIAGNOSIS — Z23 Encounter for immunization: Secondary | ICD-10-CM | POA: Diagnosis not present

## 2012-04-12 DIAGNOSIS — E291 Testicular hypofunction: Secondary | ICD-10-CM | POA: Diagnosis not present

## 2012-04-12 DIAGNOSIS — N400 Enlarged prostate without lower urinary tract symptoms: Secondary | ICD-10-CM | POA: Diagnosis not present

## 2012-04-12 DIAGNOSIS — N281 Cyst of kidney, acquired: Secondary | ICD-10-CM | POA: Diagnosis not present

## 2012-04-12 DIAGNOSIS — N200 Calculus of kidney: Secondary | ICD-10-CM | POA: Diagnosis not present

## 2012-04-12 DIAGNOSIS — Z125 Encounter for screening for malignant neoplasm of prostate: Secondary | ICD-10-CM | POA: Diagnosis not present

## 2012-04-14 DIAGNOSIS — Z8249 Family history of ischemic heart disease and other diseases of the circulatory system: Secondary | ICD-10-CM | POA: Diagnosis not present

## 2012-04-14 DIAGNOSIS — E785 Hyperlipidemia, unspecified: Secondary | ICD-10-CM | POA: Diagnosis not present

## 2012-04-14 DIAGNOSIS — I2510 Atherosclerotic heart disease of native coronary artery without angina pectoris: Secondary | ICD-10-CM | POA: Diagnosis not present

## 2012-04-14 DIAGNOSIS — I10 Essential (primary) hypertension: Secondary | ICD-10-CM | POA: Diagnosis not present

## 2012-04-16 DIAGNOSIS — J209 Acute bronchitis, unspecified: Secondary | ICD-10-CM | POA: Diagnosis not present

## 2012-05-05 DIAGNOSIS — E291 Testicular hypofunction: Secondary | ICD-10-CM | POA: Diagnosis not present

## 2012-06-03 DIAGNOSIS — E291 Testicular hypofunction: Secondary | ICD-10-CM | POA: Diagnosis not present

## 2012-07-01 DIAGNOSIS — E291 Testicular hypofunction: Secondary | ICD-10-CM | POA: Diagnosis not present

## 2012-07-01 DIAGNOSIS — E785 Hyperlipidemia, unspecified: Secondary | ICD-10-CM | POA: Diagnosis not present

## 2012-07-01 DIAGNOSIS — I10 Essential (primary) hypertension: Secondary | ICD-10-CM | POA: Diagnosis not present

## 2012-07-11 DIAGNOSIS — E291 Testicular hypofunction: Secondary | ICD-10-CM | POA: Diagnosis not present

## 2012-08-01 DIAGNOSIS — H5200 Hypermetropia, unspecified eye: Secondary | ICD-10-CM | POA: Diagnosis not present

## 2012-08-01 DIAGNOSIS — H04129 Dry eye syndrome of unspecified lacrimal gland: Secondary | ICD-10-CM | POA: Diagnosis not present

## 2012-08-01 DIAGNOSIS — H52229 Regular astigmatism, unspecified eye: Secondary | ICD-10-CM | POA: Diagnosis not present

## 2012-08-11 DIAGNOSIS — E291 Testicular hypofunction: Secondary | ICD-10-CM | POA: Diagnosis not present

## 2012-08-11 DIAGNOSIS — M1990 Unspecified osteoarthritis, unspecified site: Secondary | ICD-10-CM | POA: Diagnosis not present

## 2012-08-25 DIAGNOSIS — J329 Chronic sinusitis, unspecified: Secondary | ICD-10-CM | POA: Diagnosis not present

## 2012-08-25 DIAGNOSIS — R51 Headache: Secondary | ICD-10-CM | POA: Diagnosis not present

## 2012-09-06 DIAGNOSIS — E291 Testicular hypofunction: Secondary | ICD-10-CM | POA: Diagnosis not present

## 2012-09-13 DIAGNOSIS — E291 Testicular hypofunction: Secondary | ICD-10-CM | POA: Diagnosis not present

## 2012-09-27 DIAGNOSIS — E291 Testicular hypofunction: Secondary | ICD-10-CM | POA: Diagnosis not present

## 2012-10-11 ENCOUNTER — Other Ambulatory Visit: Payer: Self-pay | Admitting: Family Medicine

## 2012-10-11 DIAGNOSIS — R911 Solitary pulmonary nodule: Secondary | ICD-10-CM

## 2012-10-12 ENCOUNTER — Ambulatory Visit
Admission: RE | Admit: 2012-10-12 | Discharge: 2012-10-12 | Disposition: A | Discharge status: Home / Self Care | Payer: Medicare Other | Source: Ambulatory Visit | Attending: Family Medicine | Admitting: Family Medicine

## 2012-10-12 DIAGNOSIS — J984 Other disorders of lung: Secondary | ICD-10-CM | POA: Diagnosis not present

## 2012-10-12 DIAGNOSIS — R911 Solitary pulmonary nodule: Secondary | ICD-10-CM

## 2012-10-13 DIAGNOSIS — I10 Essential (primary) hypertension: Secondary | ICD-10-CM | POA: Diagnosis not present

## 2012-10-13 DIAGNOSIS — E785 Hyperlipidemia, unspecified: Secondary | ICD-10-CM | POA: Diagnosis not present

## 2012-10-13 DIAGNOSIS — I2510 Atherosclerotic heart disease of native coronary artery without angina pectoris: Secondary | ICD-10-CM | POA: Diagnosis not present

## 2012-10-14 ENCOUNTER — Ambulatory Visit
Admission: RE | Admit: 2012-10-14 | Discharge: 2012-10-14 | Disposition: A | Discharge status: Home / Self Care | Payer: Medicare Other | Source: Ambulatory Visit | Attending: Family Medicine | Admitting: Family Medicine

## 2012-10-14 ENCOUNTER — Other Ambulatory Visit: Payer: Self-pay | Admitting: Family Medicine

## 2012-10-14 DIAGNOSIS — M549 Dorsalgia, unspecified: Secondary | ICD-10-CM | POA: Diagnosis not present

## 2012-10-14 DIAGNOSIS — M5137 Other intervertebral disc degeneration, lumbosacral region: Secondary | ICD-10-CM | POA: Diagnosis not present

## 2012-10-14 DIAGNOSIS — M47817 Spondylosis without myelopathy or radiculopathy, lumbosacral region: Secondary | ICD-10-CM | POA: Diagnosis not present

## 2012-10-18 DIAGNOSIS — E291 Testicular hypofunction: Secondary | ICD-10-CM | POA: Diagnosis not present

## 2012-11-08 DIAGNOSIS — E291 Testicular hypofunction: Secondary | ICD-10-CM | POA: Diagnosis not present

## 2012-11-29 DIAGNOSIS — E291 Testicular hypofunction: Secondary | ICD-10-CM | POA: Diagnosis not present

## 2012-12-20 DIAGNOSIS — E291 Testicular hypofunction: Secondary | ICD-10-CM | POA: Diagnosis not present

## 2012-12-23 DIAGNOSIS — H109 Unspecified conjunctivitis: Secondary | ICD-10-CM | POA: Diagnosis not present

## 2012-12-23 DIAGNOSIS — R609 Edema, unspecified: Secondary | ICD-10-CM | POA: Diagnosis not present

## 2013-01-10 DIAGNOSIS — I8310 Varicose veins of unspecified lower extremity with inflammation: Secondary | ICD-10-CM | POA: Diagnosis not present

## 2013-01-10 DIAGNOSIS — R609 Edema, unspecified: Secondary | ICD-10-CM | POA: Diagnosis not present

## 2013-01-10 DIAGNOSIS — E291 Testicular hypofunction: Secondary | ICD-10-CM | POA: Diagnosis not present

## 2013-01-10 DIAGNOSIS — H01009 Unspecified blepharitis unspecified eye, unspecified eyelid: Secondary | ICD-10-CM | POA: Diagnosis not present

## 2013-01-31 DIAGNOSIS — E291 Testicular hypofunction: Secondary | ICD-10-CM | POA: Diagnosis not present

## 2013-02-21 DIAGNOSIS — I10 Essential (primary) hypertension: Secondary | ICD-10-CM | POA: Diagnosis not present

## 2013-02-21 DIAGNOSIS — H01009 Unspecified blepharitis unspecified eye, unspecified eyelid: Secondary | ICD-10-CM | POA: Diagnosis not present

## 2013-02-21 DIAGNOSIS — I8310 Varicose veins of unspecified lower extremity with inflammation: Secondary | ICD-10-CM | POA: Diagnosis not present

## 2013-02-21 DIAGNOSIS — E785 Hyperlipidemia, unspecified: Secondary | ICD-10-CM | POA: Diagnosis not present

## 2013-02-21 DIAGNOSIS — G609 Hereditary and idiopathic neuropathy, unspecified: Secondary | ICD-10-CM | POA: Diagnosis not present

## 2013-02-21 DIAGNOSIS — R609 Edema, unspecified: Secondary | ICD-10-CM | POA: Diagnosis not present

## 2013-02-21 DIAGNOSIS — E291 Testicular hypofunction: Secondary | ICD-10-CM | POA: Diagnosis not present

## 2013-03-21 DIAGNOSIS — E291 Testicular hypofunction: Secondary | ICD-10-CM | POA: Diagnosis not present

## 2013-04-05 DIAGNOSIS — H04129 Dry eye syndrome of unspecified lacrimal gland: Secondary | ICD-10-CM | POA: Diagnosis not present

## 2013-04-11 DIAGNOSIS — Z23 Encounter for immunization: Secondary | ICD-10-CM | POA: Diagnosis not present

## 2013-04-11 DIAGNOSIS — E291 Testicular hypofunction: Secondary | ICD-10-CM | POA: Diagnosis not present

## 2013-04-12 DIAGNOSIS — H04129 Dry eye syndrome of unspecified lacrimal gland: Secondary | ICD-10-CM | POA: Diagnosis not present

## 2013-04-13 DIAGNOSIS — G4730 Sleep apnea, unspecified: Secondary | ICD-10-CM | POA: Diagnosis not present

## 2013-04-13 DIAGNOSIS — I10 Essential (primary) hypertension: Secondary | ICD-10-CM | POA: Diagnosis not present

## 2013-04-13 DIAGNOSIS — E785 Hyperlipidemia, unspecified: Secondary | ICD-10-CM | POA: Diagnosis not present

## 2013-04-13 DIAGNOSIS — N281 Cyst of kidney, acquired: Secondary | ICD-10-CM | POA: Diagnosis not present

## 2013-04-13 DIAGNOSIS — N529 Male erectile dysfunction, unspecified: Secondary | ICD-10-CM | POA: Diagnosis not present

## 2013-04-13 DIAGNOSIS — I2510 Atherosclerotic heart disease of native coronary artery without angina pectoris: Secondary | ICD-10-CM | POA: Diagnosis not present

## 2013-04-13 DIAGNOSIS — E291 Testicular hypofunction: Secondary | ICD-10-CM | POA: Diagnosis not present

## 2013-04-13 DIAGNOSIS — N400 Enlarged prostate without lower urinary tract symptoms: Secondary | ICD-10-CM | POA: Diagnosis not present

## 2013-04-13 DIAGNOSIS — Z8249 Family history of ischemic heart disease and other diseases of the circulatory system: Secondary | ICD-10-CM | POA: Diagnosis not present

## 2013-04-26 ENCOUNTER — Telehealth: Payer: Self-pay | Admitting: Cardiology

## 2013-04-26 MED ORDER — NITROGLYCERIN 0.4 MG SL SUBL: 0.4000 mg | SUBLINGUAL_TABLET | SUBLINGUAL | Status: DC | PRN

## 2013-04-26 NOTE — Telephone Encounter (Signed)
New message   Refill nitro at [facility] at [address]

## 2013-04-28 ENCOUNTER — Ambulatory Visit: Payer: Medicare Other

## 2013-04-28 ENCOUNTER — Ambulatory Visit (INDEPENDENT_AMBULATORY_CARE_PROVIDER_SITE_OTHER): Payer: Medicare Other | Admitting: Family Medicine

## 2013-04-28 VITALS — BP 138/64 | HR 83 | Temp 99.0°F | Resp 18 | Ht 70.0 in | Wt 344.6 lb

## 2013-04-28 DIAGNOSIS — R609 Edema, unspecified: Secondary | ICD-10-CM | POA: Diagnosis not present

## 2013-04-28 DIAGNOSIS — R0602 Shortness of breath: Secondary | ICD-10-CM

## 2013-04-28 DIAGNOSIS — R600 Localized edema: Secondary | ICD-10-CM

## 2013-04-28 DIAGNOSIS — R7981 Abnormal blood-gas level: Secondary | ICD-10-CM | POA: Diagnosis not present

## 2013-04-28 LAB — BASIC METABOLIC PANEL
CO2: 39 mEq/L — ABNORMAL HIGH (ref 19–32)
Calcium: 8.8 mg/dL (ref 8.4–10.5)
Creat: 1.51 mg/dL — ABNORMAL HIGH (ref 0.50–1.35)

## 2013-04-28 NOTE — Patient Instructions (Signed)
Increase your lasix to three pills daily 2 in the morning and one in the evening We will call you with the lab results, and advise on next step If you develop chest pain or increased shortness of breath, you are to go to the emergency room for this. We will advise on potassium dose based on the potassium lab done today.    Venous Stasis and Chronic Venous Insufficiency As people age, the veins located in their legs may weaken and stretch. When veins weaken and lose the ability to pump blood effectively, the condition is called chronic venous insufficiency (CVI) or venous stasis. Almost all veins return blood back to the heart. This happens by:  The force of the heart pumping fresh blood pushes blood back to the heart.  Blood flowing to the heart from the force of gravity. In the deep veins of the legs, blood has to fight gravity and flow upstream back to the heart. Here, the leg muscles contract to pump blood back toward the heart. Vein walls are elastic, and many veins have small valves that only allow blood to flow in one direction. When leg muscles contract, they push inward against the elastic vein walls. This squeezes blood upward, opens the valves, and moves blood toward the heart. When leg muscles relax, the vein wall also relaxes and the valves inside the vein close to prevent blood from flowing backward. This method of pumping blood out of the legs is called the venous pump. CAUSES  The venous pump works best while walking and leg muscles are contracting. But when a person sits or stands, blood pressure in leg veins can build. Deep veins are usually able to withstand short periods of inactivity, but long periods of inactivity (and increased pressure) can stretch, weaken, and damage vein walls. High blood pressure can also stretch and damage vein walls. The veins may no longer be able to pump blood back to the heart. Venous hypertension (high blood pressure inside veins) that lasts over time is  a primary cause of CVI. CVI can also be caused by:   Deep vein thrombosis, a condition where a thrombus (blood clot) blocks blood flow in a vein.  Phlebitis, an inflammation of a superficial vein that causes a blood clot to form. Other risk factors for CVI may include:   Heredity.  Obesity.  Pregnancy.  Sedentary lifestyle.  Smoking.  Jobs requiring long periods of standing or sitting in one place.  Age and gender:  Women in their 65's and 48's and men in their 50's are more prone to developing CVI. SYMPTOMS  Symptoms of CVI may include:   Varicose veins.  Ulceration or skin breakdown.  Lipodermatosclerosis, a condition that affects the skin just above the ankle, usually on the inside surface. Over time the skin becomes brown, smooth, tight and often painful. Those with this condition have a high risk of developing skin ulcers.  Reddened or discolored skin on the leg.  Swelling. DIAGNOSIS  Your caregiver can diagnose CVI after performing a careful medical history and physical examination. To confirm the diagnosis, the following tests may also be ordered:   Duplex ultrasound.  Plethysmography (tests blood flow).  Venograms (x-ray using a special dye). TREATMENT The goals of treatment for CVI are to restore a person to an active life and to minimize pain or disability. Typically, CVI does not pose a serious threat to life or limb, and with proper treatment most people with this condition can continue to lead active  lives. In most cases, mild CVI can be treated on an outpatient basis with simple procedures. Treatment methods include:   Elastic compression socks.  Sclerotherapy, a procedure involving an injection of a material that "dissolves" the damaged veins. Other veins in the network of blood vessels take over the function of the damaged veins.  Vein stripping (an older procedure less commonly used).  Laser Ablation surgery.  Valve repair. HOME CARE  INSTRUCTIONS   Elastic compression socks must be worn every day. They can help with symptoms and lower the chances of the problem getting worse, but they do not cure the problem.  Only take over-the-counter or prescription medicines for pain, discomfort, or fever as directed by your caregiver.  Your caregiver will review your other medications with you. SEEK MEDICAL CARE IF:   You are confused about how to take your medications.  There is redness, swelling, or increasing pain in the affected area.  There is a red streak or line that extends up or down from the affected area.  There is a breakdown or loss of skin in the affected area, even if the breakdown is small.  You develop an unexplained oral temperature above 102 F (38.9 C).  There is an injury to the affected area. SEEK IMMEDIATE MEDICAL CARE IF:   There is an injury and open wound to the affected area.  Pain is not adequately relieved with pain medication prescribed or becomes severe.  An oral temperature above 102 F (38.9 C) develops.  The foot/ankle below the affected area becomes suddenly numb or the area feels weak and hard to move. MAKE SURE YOU:   Understand these instructions.  Will watch your condition.  Will get help right away if you are not doing well or get worse. Document Released: 11/09/2006 Document Revised: 09/28/2011 Document Reviewed: 01/17/2007 [facility] Patient Information 2014 [place], [place].

## 2013-04-28 NOTE — Progress Notes (Addendum)
[facility] [address] [place] [address] [phone]  Date:  04/28/2013   Name:  Shane Watson   DOB:  December 24, 1945   MRN:  [id number]  PCP:  Shirline Frees, MD    Chief Complaint: Leg Swelling   History of Present Illness:  Shane Watson is a 67 y.o. very pleasant male patient who presents with the following:  He is here today with "my legs weeping" off an on for over a year.  His PCP "told me it was due to my weight and my fluid, and he gave me lasix."  He also has an rx for compression hose but just never picked them up.    His LE swelling and weeping has been worse since the springtime.    He is taking lasix 20 BID.  He has been taking this much for about one month. He was taking triamterene prior and changed to lasit.  He has not noted much change with this switch.  Fluid will drain out of his legs especially in the afternoon. "I have to put towels under my legs so I don't get it on the furniture.    His cardiologist is Dr. [name]- now with [facility] cardiology  He has OSA.  He has a hard time when he is supine unless he uses his CPAP.   "I can sleep sitting up and it don't bother me."  He uses just one pillow.  He has slept in a recliner the last couple of nights but this was jusst because he was staying at his Gf's house.    He does not use home O2  He notes SOB "when I take the dog out and I have to run."  This is unchanged and not acutely worse.   Denies any CP or palpitations.    There are no active problems to display for this patient.   Past Medical History  Diagnosis Date  . Allergy   . Anemia   . Neuropathy     Past Surgical History  Procedure Laterality Date  . Cholecystectomy      History  Substance Use Topics  . Smoking status: Former Smoker    Quit date: 04/28/1993  . Smokeless tobacco: Not on file  . Alcohol Use: No    Family History  Problem Relation Age of Onset  . Diabetes Mother   . Heart disease Father    . Heart disease Sister   . Diabetes Sister     Allergies  Allergen Reactions  . Sulfa Antibiotics Hives  . Aspirin Swelling  . Penicillins Hives    Medication list has been reviewed and updated.  Current Outpatient Prescriptions on File Prior to Visit  Medication Sig Dispense Refill  . chlorpheniramine-HYDROcodone (TUSSIONEX PENNKINETIC ER) 10-8 MG/5ML LQCR Take 5 mLs by mouth every 12 (twelve) hours.  115 mL  0  . nitroGLYCERIN (NITROSTAT) 0.4 MG SL tablet Place 1 tablet (0.4 mg total) under the tongue every 5 (five) minutes as needed for chest pain.  60 tablet  3   No current facility-administered medications on file prior to visit.    Review of Systems:  As per HPI- otherwise negative.   Physical Examination: Filed Vitals:   04/28/13 1514  BP: 138/64  Pulse: 83  Temp: 99 F (37.2 C)  Resp: 18   Filed Vitals:   04/28/13 1514  Height: 5\' 10"  (1.778 m)  Weight: 344 lb 9.6 oz (156.31 kg)   Body  mass index is 49.45 kg/(m^2). Ideal Body Weight: Weight in (lb) to have BMI = 25: 173.9  GEN: WDWN, NAD, Non-toxic, A & O x 3, morbid obesity HEENT: Atraumatic, Normocephalic. Neck supple. No masses, No LAD. Ears and Nose: No external deformity. CV: RRR, No M/G/R. No JVD. No thrill. No extra heart sounds. PULM: CTA B, no wheezes, crackles, rhonchi. No retractions. No resp. distress. No accessory muscle use. ABD: S, NT, ND, +BS. No rebound. No HSM. EXTR: No c/c.  Bilateral edema to the knees, with some weeping right more than left.  No sign of cellulitis, no redness or heat.  NEURO Normal gait.  PSYCH: Normally interactive. Conversant. Not depressed or anxious appearing.  Calm demeanor.   [facility] reading (PRIMARY) by  Dr. Lorelei Pont. CXR:  Obesity, no effusion or infiltrate  CHEST 2 VIEW  COMPARISON: CHEST x-ray 04/16/2012. Chest CT 10/12/2012.  FINDINGS: Large eventration of the right hemidiaphragm, unchanged compared to prior examinations. Low lung volumes, without  acute consolidative airspace disease. No pleural effusions. Small amount of thickening of the periphery of the horizontal fissure, unchanged, likely to represent mild scarring. No evidence of pulmonary edema. Heart size is normal. Mediastinal contours are otherwise unremarkable. Atherosclerosis in the thoracic aorta.  IMPRESSION: 1. Low lung volumes without radiographic evidence of acute cardiopulmonary disease. 2. Atherosclerosis. 3. 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  Assessment and Plan: Bilateral lower extremity edema - Plan: Basic metabolic panel, DG Chest 2 View  Morbid obesity  Shortness of breath - Plan: Brain natriuretic peptide  Low oxygen saturation  Chronic LE edema.  Suspect due to venous insufficiency, but also consider CHF.  CXR is reassuring, await BNP.  Check BMP for electrolyte and renal status.  He may increase to 60 mg of lasix a day in the meantime.  He is taking K 52meq daily, will adjust as needed.   Encouraged leg elevation and compression stockings.  Counseled him that I agree with his PCP in that weight loss is key in getting rid of this edema.    Low O2 saturation.  At this time he denies any acute SOB, suspect this may be his baseline due to obesity. No tachycardia or acute sx to suggest PE.  May need overnight O2 monitoring and pulmonary referral for CPAP management.  Will follow-up pending his BNP.    Signed Lamar Blinks, MD  Spoke with pt on 10/11: BNP is normal, so we do not suspect that he has CHF.  His creat is a little bit elevated; we will need to keep a close eye on this.  He may take 60 mg of lasix for a few days, but then will need to decrease due to renal function.  K is fine, continue current dose of K-dur. Will refer to pulmonology to evaluate likely chronic hypoxemia.  May need adjustment of his Cpap as well

## 2013-04-30 ENCOUNTER — Encounter: Payer: Self-pay | Admitting: Family Medicine

## 2013-04-30 NOTE — Addendum Note (Signed)
Addended by: Lamar Blinks C on: 04/30/2013 06:17 AM   Modules accepted: Orders

## 2013-05-02 ENCOUNTER — Encounter: Payer: Self-pay | Admitting: Internal Medicine

## 2013-05-02 ENCOUNTER — Ambulatory Visit (INDEPENDENT_AMBULATORY_CARE_PROVIDER_SITE_OTHER): Payer: Medicare Other | Admitting: Internal Medicine

## 2013-05-02 VITALS — BP 134/78 | HR 62 | Temp 98.0°F | Ht 71.0 in | Wt 342.0 lb

## 2013-05-02 DIAGNOSIS — J9610 Chronic respiratory failure, unspecified whether with hypoxia or hypercapnia: Secondary | ICD-10-CM | POA: Diagnosis not present

## 2013-05-02 DIAGNOSIS — G4733 Obstructive sleep apnea (adult) (pediatric): Secondary | ICD-10-CM | POA: Insufficient documentation

## 2013-05-02 DIAGNOSIS — E291 Testicular hypofunction: Secondary | ICD-10-CM | POA: Diagnosis not present

## 2013-05-02 DIAGNOSIS — J449 Chronic obstructive pulmonary disease, unspecified: Secondary | ICD-10-CM

## 2013-05-02 MED ORDER — FLUTICASONE FUROATE-VILANTEROL 100-25 MCG/INH IN AEPB: 1.0000 | INHALATION_SPRAY | Freq: Every morning | RESPIRATORY_TRACT | Status: DC

## 2013-05-02 NOTE — Patient Instructions (Signed)
Breo one puff daily automatically to see if breathing improves  Please see patient coordinator before you leave today  to schedule overnight 02 on cpap.  Please schedule a follow up office visit in 6 weeks, call sooner if needed for pfts.

## 2013-05-02 NOTE — Progress Notes (Signed)
  Subjective:    Patient ID: Shane Watson, male    DOB: 04-Feb-1946   MRN: [id number]  HPI  72 yowm  Quit smoking 1987 at wt = 240   Referred 05/02/2013 by Shane Watson for low 02. 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  Uses alb seems to help some with assoc subj wheeze  No obvious day to day or daytime variabilty or assoc chronic cough or cp or chest tightness,   overt sinus or hb symptoms. No unusual exp hx or h/o childhood pna/ asthma or knowledge of premature birth.  Sleeping ok most nights on cpap without nocturnal  or early am exacerbation  of respiratory  c/o's or need for noct saba. Also denies any obvious fluctuation of symptoms with weather or environmental changes or other aggravating or alleviating factors except as outlined above   Current Medications, Allergies, Complete Past Medical History, Past Surgical History, Family History, and Social History were reviewed in [employer] record.     Review of Systems  Constitutional: Negative for fever and unexpected weight change.  HENT: Positive for postnasal drip and sinus pressure. Negative for congestion, dental problem, ear pain, nosebleeds, rhinorrhea, sneezing, sore throat and trouble swallowing.   Eyes: Positive for redness and itching.  Respiratory: Positive for shortness of breath and wheezing. Negative for cough and chest tightness.   Cardiovascular: Negative for palpitations and leg swelling.  Gastrointestinal: Negative for nausea and vomiting.  Genitourinary: Negative for dysuria.  Musculoskeletal: Negative for joint swelling.  Skin:  Negative for rash.  Neurological: Negative for headaches.  Hematological: Bruises/bleeds easily.  Psychiatric/Behavioral: Negative for dysphoric mood. The patient is not nervous/anxious.        Objective:   Physical Exam Wt Readings from Last 3 Encounters:  05/02/13 342 lb (155.13 kg)  04/28/13 344 lb 9.6 oz (156.31 kg)     HEENT: nl dentition, turbinates, and orophanx. Nl external ear canals without cough reflex   NECK :  without JVD/Nodes/TM/ nl carotid upstrokes bilaterally   LUNGS: no acc muscle use, clear to A and P bilaterally without cough on insp or exp maneuvers   CV:  RRR  no s3 or murmur or increase in P2, bilateral severe pitting edema with chronic venous stasis changes   ABD:  Massively obese soft and nontender with nl excursion in the supine position. No bruits or organomegaly, bowel sounds nl  MS:  warm without deformities, calf tenderness, cyanosis or clubbing  SKIN: warm and dry without lesions    NEURO:  alert, approp, no deficits      04/28/13 cxr 1. Low lung volumes without radiographic evidence of acute  cardiopulmonary disease.  2. Atherosclerosis.  3. 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        Assessment & Plan:

## 2013-05-03 DIAGNOSIS — J9610 Chronic respiratory failure, unspecified whether with hypoxia or hypercapnia: Secondary | ICD-10-CM | POA: Insufficient documentation

## 2013-05-03 DIAGNOSIS — J449 Chronic obstructive pulmonary disease, unspecified: Secondary | ICD-10-CM | POA: Insufficient documentation

## 2013-05-03 DIAGNOSIS — J4489 Other specified chronic obstructive pulmonary disease: Secondary | ICD-10-CM | POA: Insufficient documentation

## 2013-05-03 NOTE — Assessment & Plan Note (Signed)
Suspect this is relatively mild vs obesity but since he does report some benefit from saba reasonable to give trial of Breo pending f/u with pfts  The proper method of use, as well as anticipated side effects, of a metered-dose dry powder inhaler are discussed and demonstrated to the patient. Improved effectiveness after extensive coaching during this visit to a level of approximately  90%   See instructions for specific recommendations which were reviewed directly with the patient who was given a copy with highlighter outlining the key components.

## 2013-05-03 NOTE — Assessment & Plan Note (Signed)
-   05/02/2013   Walked RA x 10 ft stopped due to  sats down to 87%   Most likely due to obesity > copd but for time being needs to be on 02 24/7 and check ono on CPAP based on apparent refractory edema ? Related to cor pulmonale (echo requested)

## 2013-05-04 ENCOUNTER — Telehealth: Payer: Self-pay

## 2013-05-04 NOTE — Telephone Encounter (Signed)
PATIENT CALLED COMPLAIMING THAT HE GOT TOO MANY NITRO PILLS HE ONLY WANT 25 TABS WITH 3REFILLS

## 2013-05-10 DIAGNOSIS — H04129 Dry eye syndrome of unspecified lacrimal gland: Secondary | ICD-10-CM | POA: Diagnosis not present

## 2013-05-11 DIAGNOSIS — G4733 Obstructive sleep apnea (adult) (pediatric): Secondary | ICD-10-CM | POA: Diagnosis not present

## 2013-05-15 ENCOUNTER — Other Ambulatory Visit: Payer: Self-pay | Admitting: Internal Medicine

## 2013-05-15 ENCOUNTER — Encounter: Payer: Self-pay | Admitting: Internal Medicine

## 2013-05-15 DIAGNOSIS — J9610 Chronic respiratory failure, unspecified whether with hypoxia or hypercapnia: Secondary | ICD-10-CM

## 2013-05-16 ENCOUNTER — Telehealth: Payer: Self-pay | Admitting: Internal Medicine

## 2013-05-16 ENCOUNTER — Telehealth: Payer: Self-pay | Admitting: *Deleted

## 2013-05-16 DIAGNOSIS — J9610 Chronic respiratory failure, unspecified whether with hypoxia or hypercapnia: Secondary | ICD-10-CM

## 2013-05-16 NOTE — Telephone Encounter (Signed)
Message copied by Rosana Berger on Tue May 16, 2013  3:21 PM ------      Message from: Christinia Gully B      Created: Tue May 16, 2013 11:58 AM      Regarding: RE: o2 during the day       Yes the order should be for 2lpm 24/7      [place] to order ono ra at night as well if needed to qualify for noct 02      ----- Message -----         From: Rosana Berger, CMA         Sent: 05/16/2013  11:45 AM           To: Tanda Rockers, MD      Subject: Shane Watson: o2 during the day                                                ----- Message -----         From: Catha Gosselin         Sent: 05/16/2013  11:39 AM           To: Rosana Berger, CMA      Subject: o2 during the day                                        Pt was seen by Dr. [name] on 05/02/13 and according to walking sats, looks like he qualified for o2 24/7, however, there never was a referral placed for o2 during the day and the liter flow.       There was only order placed for o2 at night.      Can you check behind me, b/c the patient told [facility] that he needs o2 during the day as well.      Please send order to [facility] for o2 during the day if this is correct.      Thanks,      Shane Watson       ------

## 2013-05-16 NOTE — Telephone Encounter (Signed)
Spoke with pt-- Pt reports receiving phone call from [facility] in regards to starting O2 Pt not aware of this whatsoever-never advised of beginning O2  After looking through chart, order was placed for pt to have repeat ONO on CPAP with O2 2Lpm - 05/15/13 order sent by [place] Pt to call [facility] back in regards to setting up appt for this test.

## 2013-05-17 NOTE — Telephone Encounter (Signed)
I called and spoke with pt. Explained to him that MW does want him tohave O2 24/7. He was in and desaturated when walking in office. MW wants ONO on 2 liters to make sure 2 liters is enough for pt at bedtime. He finally understood. i called [facility] to make them aware pt understands and can call him to get this set up. Nothing further needed

## 2013-05-17 NOTE — Telephone Encounter (Signed)
[name] from [facility] called.  Pt states that he doesn't need O2, that the doctor nor anyone from this office advised pt that he needs O2.  [name] states pt believes the O2 is for only one night.  Asks if we could call & talk to the pt. [name] can be reached at [phone].  [name]

## 2013-05-23 DIAGNOSIS — E291 Testicular hypofunction: Secondary | ICD-10-CM | POA: Diagnosis not present

## 2013-05-29 ENCOUNTER — Encounter: Payer: Self-pay | Admitting: Internal Medicine

## 2013-05-29 DIAGNOSIS — J441 Chronic obstructive pulmonary disease with (acute) exacerbation: Secondary | ICD-10-CM | POA: Diagnosis not present

## 2013-05-29 DIAGNOSIS — H6690 Otitis media, unspecified, unspecified ear: Secondary | ICD-10-CM | POA: Diagnosis not present

## 2013-06-06 ENCOUNTER — Encounter: Payer: Self-pay | Admitting: Internal Medicine

## 2013-06-06 ENCOUNTER — Ambulatory Visit (INDEPENDENT_AMBULATORY_CARE_PROVIDER_SITE_OTHER): Payer: Medicare Other | Admitting: Internal Medicine

## 2013-06-06 VITALS — BP 140/70 | HR 68 | Temp 98.0°F | Ht 70.0 in | Wt 349.0 lb

## 2013-06-06 DIAGNOSIS — J449 Chronic obstructive pulmonary disease, unspecified: Secondary | ICD-10-CM

## 2013-06-06 DIAGNOSIS — J9610 Chronic respiratory failure, unspecified whether with hypoxia or hypercapnia: Secondary | ICD-10-CM | POA: Diagnosis not present

## 2013-06-06 MED ORDER — BUDESONIDE-FORMOTEROL FUMARATE 160-4.5 MCG/ACT IN AERO: INHALATION_SPRAY | RESPIRATORY_TRACT | Status: DC

## 2013-06-06 NOTE — Progress Notes (Signed)
PFT done today. 

## 2013-06-06 NOTE — Progress Notes (Signed)
Subjective:    Patient ID: Shane Watson, male    DOB: Dec 18, 1945   MRN: [id number]    Brief patient profile:  36 yowm  Quit smoking 1987 at wt = 240   Referred 05/02/2013 by [name] for low 02. 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  Uses alb seems to help some with assoc subj wheeze rec Breo one puff daily automatically to see if breathing improves Please see patient coordinator before you leave today  to schedule overnight 02 on cpap.  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 Follow-up    PFT done today.  Discuss O2  Sitting still runs in 90 on no 02 Walking it runs 87 on no 02 breo can't tell difference vs before, no change in need for saba, worse with "bad cold" rx [name] one week prior to OV  > improved back to baseline doe/ saba need  No obvious day to day or daytime variabilty or assoc excess/ purulent sputum cp or chest tightness, subjective wheeze overt sinus or hb symptoms. No unusual exp hx or h/o childhood pna/ asthma or knowledge of premature birth.  Sleeping ok without nocturnal  or early am exacerbation  of respiratory  c/o's or need for noct saba. Also denies any obvious fluctuation of symptoms with weather or environmental changes or other aggravating or alleviating factors except as outlined above   Current Medications, Allergies, Complete Past Medical History, Past Surgical History, Family History, and Social History were reviewed in [employer] record.  ROS  The following are  not active complaints unless bolded sore throat, dysphagia, dental problems, itching, sneezing,  nasal congestion or excess/ purulent secretions, ear ache,   fever, chills, sweats, unintended wt loss, pleuritic or exertional cp, hemoptysis,  orthopnea pnd or leg swelling, presyncope, palpitations, heartburn, abdominal pain, anorexia, nausea, vomiting, diarrhea  or change in bowel or urinary habits, change in stools or urine, dysuria,hematuria,  rash, arthralgias, visual complaints, headache, numbness weakness or ataxia or problems with walking or coordination,  change in mood/affect or memory.                 Objective:   Physical Exam   Wt Readings from Last 3 Encounters:  06/06/13 349 lb (158.305 kg)  05/02/13 342 lb (155.13 kg)  04/28/13 344 lb 9.6 oz (156.31 kg)        HEENT: nl dentition, turbinates, and orophanx. Nl external ear canals without cough reflex   NECK :  without JVD/Nodes/TM/ nl carotid upstrokes bilaterally   LUNGS: no acc muscle use, clear to A and P bilaterally without cough on insp or exp maneuvers   CV:  RRR  no s3 or murmur or increase in P2, bilateral severe pitting edema with chronic venous stasis changes   ABD:  Massively obese soft and nontender with nl excursion in the supine position. No bruits or organomegaly, bowel sounds nl  MS:  warm without deformities, calf tenderness, cyanosis  or clubbing  SKIN: warm and dry without lesions           04/28/13 cxr 1. Low lung volumes without radiographic evidence of acute  cardiopulmonary disease.  2. Atherosclerosis.  3. 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        Assessment & Plan:

## 2013-06-06 NOTE — Patient Instructions (Addendum)
Stop breo and start Symbicort 160 Take 2 puffs first thing in am and then another 2 puffs about 12 hours later.   Only use your albuterol as a rescue medication to be used if you can't catch your breath by resting or doing a relaxed purse lip breathing pattern.  - The less you use it, the better it will work when you need it. - Ok to use up to every 4 hours if you must but call for immediate appointment if use goes up over your usual need - Don't leave home without it !!  (think of it like your spare tire for your car)   Work on inhaler technique:  relax and gently blow all the way out then take a nice smooth deep breath back in, triggering the inhaler at same time you start breathing in.  Hold for up to 5 seconds if you can.  Rinse and gargle with water when done  Always wear the 02 at bedtime and when attempting to exert (keeping the sats above 90%)  Please schedule a follow up visit in 3 months but call sooner if needed

## 2013-06-08 NOTE — Assessment & Plan Note (Addendum)
-   started breo 05/02/13 > d/c 06/06/13 - hfa 75%  06/06/13 - PFT's 06/06/13  FEV1 1.59 (47%) ratio 62 and 13% better p breo am ov test, DLC0 82 corrects to 113%   DDX of  difficult airways managment all start with A and  include Adherence, Ace Inhibitors, Acid Reflux, Active Sinus Disease, Alpha 1 Antitripsin deficiency, Anxiety masquerading as Airways dz,  ABPA,  allergy(esp in young), Aspiration (esp in elderly), Adverse effects of DPI,  Active smokers, plus two Bs  = Bronchiectasis and Beta blocker use..and one C= CHF  Adherence is always the initial "prime suspect" and is a multilayered concern that requires a "trust but verify" approach in every patient - starting with knowing how to use medications, especially inhalers, correctly, keeping up with refills and understanding the fundamental difference between maintenance and prns vs those medications only taken for a very short course and then stopped and not refilled.  -  The proper method of use, as well as anticipated side effects, of a metered-dose inhaler are discussed and demonstrated to the patient. Improved effectiveness after extensive coaching during this visit to a level of approximately  75% so try symbicort 160 2bid  ? advers effect of dpi > doubt it but not helping so far so change to hfa    Each maintenance medication was reviewed in detail including most importantly the difference between maintenance and as needed and under what circumstances the prns are to be used.  Please see instructions for details which were reviewed in writing and the patient given a copy.

## 2013-06-08 NOTE — Assessment & Plan Note (Addendum)
-   05/02/2013   Walked RA x 10 ft stopped due to  sats down to 87%  -  06/07/13 ONO RA/Cpap desat  > 4h at < 89%  rec repeat on 2lpm / cpap  Most likely related to obesity and dysfunction R HD (synergistic effects)   Again advised to use 02 with exertion to help burn fat

## 2013-06-13 DIAGNOSIS — E291 Testicular hypofunction: Secondary | ICD-10-CM | POA: Diagnosis not present

## 2013-06-26 ENCOUNTER — Telehealth: Payer: Self-pay

## 2013-06-26 ENCOUNTER — Other Ambulatory Visit: Payer: Self-pay | Admitting: Cardiology

## 2013-06-26 ENCOUNTER — Telehealth: Payer: Self-pay | Admitting: Cardiology

## 2013-06-26 MED ORDER — POTASSIUM CHLORIDE CRYS ER 20 MEQ PO TBCR: 20.0000 meq | EXTENDED_RELEASE_TABLET | Freq: Every day | ORAL | Status: DC

## 2013-06-26 NOTE — Telephone Encounter (Signed)
New problem       Pt needs his [employer] 90days called in to [place].  He is out.  I may be all of his med.   Pharm. been faxing wrong # .

## 2013-06-29 NOTE — Telephone Encounter (Signed)
refill 

## 2013-07-04 DIAGNOSIS — E291 Testicular hypofunction: Secondary | ICD-10-CM | POA: Diagnosis not present

## 2013-07-21 DIAGNOSIS — J440 Chronic obstructive pulmonary disease with acute lower respiratory infection: Secondary | ICD-10-CM | POA: Diagnosis not present

## 2013-07-26 DIAGNOSIS — E291 Testicular hypofunction: Secondary | ICD-10-CM | POA: Diagnosis not present

## 2013-07-31 ENCOUNTER — Telehealth: Payer: Self-pay | Admitting: Cardiology

## 2013-07-31 ENCOUNTER — Encounter: Payer: Self-pay | Admitting: *Deleted

## 2013-07-31 NOTE — Telephone Encounter (Signed)
New Problem:  Pt is requesting the nurse call him back and help him navigate MyChart.

## 2013-07-31 NOTE — Telephone Encounter (Signed)
Pt was given number to [facility].

## 2013-08-07 ENCOUNTER — Ambulatory Visit
Admission: RE | Admit: 2013-08-07 | Discharge: 2013-08-07 | Disposition: A | Discharge status: Home / Self Care | Payer: Medicare Other | Source: Ambulatory Visit | Attending: Family Medicine | Admitting: Family Medicine

## 2013-08-07 ENCOUNTER — Other Ambulatory Visit: Payer: Self-pay | Admitting: Family Medicine

## 2013-08-07 DIAGNOSIS — J441 Chronic obstructive pulmonary disease with (acute) exacerbation: Secondary | ICD-10-CM | POA: Diagnosis not present

## 2013-08-07 DIAGNOSIS — R05 Cough: Secondary | ICD-10-CM | POA: Diagnosis not present

## 2013-08-07 DIAGNOSIS — R059 Cough, unspecified: Secondary | ICD-10-CM | POA: Diagnosis not present

## 2013-08-10 ENCOUNTER — Encounter: Payer: Self-pay | Admitting: Internal Medicine

## 2013-08-10 ENCOUNTER — Ambulatory Visit (INDEPENDENT_AMBULATORY_CARE_PROVIDER_SITE_OTHER): Payer: Medicare Other | Admitting: Internal Medicine

## 2013-08-10 VITALS — BP 142/68 | HR 80 | Temp 97.8°F | Ht 71.0 in | Wt 352.0 lb

## 2013-08-10 DIAGNOSIS — J449 Chronic obstructive pulmonary disease, unspecified: Secondary | ICD-10-CM | POA: Diagnosis not present

## 2013-08-10 DIAGNOSIS — J9610 Chronic respiratory failure, unspecified whether with hypoxia or hypercapnia: Secondary | ICD-10-CM | POA: Diagnosis not present

## 2013-08-10 DIAGNOSIS — J4489 Other specified chronic obstructive pulmonary disease: Secondary | ICD-10-CM

## 2013-08-10 MED ORDER — PANTOPRAZOLE SODIUM 40 MG PO TBEC
40.0000 mg | DELAYED_RELEASE_TABLET | Freq: Every day | ORAL | Status: DC
Start: 2013-08-10 — End: 2013-11-03

## 2013-08-10 MED ORDER — METHYLPREDNISOLONE ACETATE 80 MG/ML IJ SUSP
120.0000 mg | Freq: Once | INTRAMUSCULAR | Status: AC
  Administered 2013-08-10: 120 mg via INTRAMUSCULAR

## 2013-08-10 MED ORDER — FAMOTIDINE 20 MG PO TABS
ORAL_TABLET | ORAL | Status: DC
Start: 2013-08-10 — End: 2013-11-03

## 2013-08-10 NOTE — Progress Notes (Signed)
Subjective:    Patient ID: Shane Watson, male    DOB: July 08, 1946   MRN: [id number]    Brief patient profile:  53 yowm  Quit smoking 1987 at wt = 240   Referred 05/02/2013 by [name] for low 02. 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    History of Present Illness  05/02/2013 1st [facility] office visit/ [name] cc indolent onset progressive doe x sev years with w/u by [name] with stent seemed to help at that point and no change uses HC sticker to go shopping and leans on cart at HT due to back but  does do some hunting walking x sev hundred feet.  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 Follow-up    PFT done today.  Discuss O2  Sitting still runs in 90s on no 02 Walking it runs 87 on no 02 breo can't tell difference vs before, no change in need for saba, worse with "bad cold" rx [name] one week prior to OV  > improved back to baseline doe/ saba need rec Stop breo and start Symbicort 160 Take 2 puffs first thing in am and then another 2 puffs about 12 hours later.  Only use your albuterol as a rescue medication   Work on inhaler technique:    Always wear the 02 at bedtime and when attempting to exert (keeping the sats above 90%)    08/10/2013 f/u ov/[name] re:  COPD GOLD II / cpap and 02 at hs Chief Complaint  Patient presents with  . Acute Visit    Pt c/o chest congestion, wheezing, prod cough with gray/white mucous X73month.  Now has neb worked a little bit, last used 2 h prior to OV , no longer using gerd rx. Onset of decline was gradual, progressive, cough can be severe/ hacking     No obvious day to day or daytime variabilty or assoc excess/ purulent sputum cp or chest tightness, subjective wheeze overt sinus or hb symptoms. No unusual exp hx or h/o childhood pna/ asthma or knowledge of premature birth.  Sleeping ok without nocturnal  or early am exacerbation  of respiratory  c/o's or need for noct saba. Also denies any obvious fluctuation of symptoms with weather or environmental changes or other aggravating or alleviating factors except as outlined above   Current Medications, Allergies, Complete Past Medical History, Past Surgical History, Family History, and Social History were reviewed in [employer] record.  ROS  The following are not active complaints unless bolded sore throat, dysphagia, dental problems, itching, sneezing,  nasal congestion or excess/ purulent secretions, ear ache,   fever, chills, sweats, unintended wt loss, pleuritic or exertional cp, hemoptysis,  orthopnea pnd or leg swelling, presyncope, palpitations, heartburn, abdominal pain, anorexia, nausea, vomiting, diarrhea  or change in bowel or urinary habits, change in stools or urine, dysuria,hematuria,  rash, arthralgias, visual complaints, headache, numbness weakness or ataxia or problems with walking or coordination,  change in mood/affect or memory.                 Objective:  Physical Exam  08/10/2013      352 Wt Readings from Last 3 Encounters:  06/06/13 349 lb (158.305 kg)  05/02/13 342 lb (155.13 kg)  04/28/13 344 lb 9.6 oz (156.31 kg)      amb massively obese wm with sats 90% ra  HEENT: nl dentition, turbinates, and orophanx. Nl external ear canals without cough reflex   NECK :  without JVD/Nodes/TM/ nl carotid upstrokes bilaterally   LUNGS: no acc muscle use, clear to A and P bilaterally without cough on insp or exp maneuvers   CV:  RRR  no s3 or murmur or increase in P2, wearing TEDS with bilateral severe pitting edema with chronic venous stasis  changes   ABD:  Massively obese soft and nontender with nl excursion in the supine position. No bruits or organomegaly, bowel sounds nl  MS:  warm without deformities, calf tenderness, cyanosis or clubbing  SKIN: warm and dry without lesions           08/07/13 cxr 1. No acute findings.  2. Right upper lobe nodule described on 10/12/2012 is not readily  appreciated         Assessment & Plan:

## 2013-08-10 NOTE — Patient Instructions (Addendum)
Work on inhaler technique:  relax and gently blow all the way out then take a nice smooth deep breath back in, triggering the inhaler at same time you start breathing in.  Hold for up to 5 seconds if you can.  Rinse and gargle with water when done  start spiriva 2 puffs each am  Continue symbicort 160 Take 2 puffs first thing in am and then another 2 puffs about 12 hours later.     Only use your albuterol as a rescue medication to be used if you can't catch your breath by resting or doing a relaxed purse lip breathing pattern.  - The less you use it, the better it will work when you need it. - Ok to use up to 2 puffs  every 4 hours (either puffer or nebulizer) if you must but call for immediate appointment if use goes up over your usual need - Don't leave home without it !!  (think of it like the spare tire for your car)   Pantoprazole (protonix) 40 mg   Take 30-60 min before first meal of the day and Pepcid 20 mg one bedtime until return to office - this is the best way to tell whether stomach acid is contributing to your problem.    GERD (REFLUX)  is an extremely common cause of respiratory symptoms, many times with no significant heartburn at all.    It can be treated with medication, but also with lifestyle changes including avoidance of late meals, excessive alcohol, smoking cessation, and avoid fatty foods, chocolate, peppermint, colas, red wine, and acidic juices such as orange juice.  NO MINT OR MENTHOL PRODUCTS SO NO COUGH DROPS  USE SUGARLESS CANDY INSTEAD (jolley ranchers or Stover's)  NO OIL BASED VITAMINS - use powdered substitutes.        See Tammy NP w/in 2 weeks with all your medications, even over the counter meds, separated in two separate bags, the ones you take no matter what vs the ones you stop once you feel better and take only as needed when you feel you need them.   Tammy  will generate for you a new user friendly medication calendar that will put [place] all on the same  page re: your medication use.     Without this process, it simply isn't possible to assure that we are providing  your outpatient care  with  the attention to detail we feel you deserve.   If we cannot assure that you're getting that kind of care,  then we cannot manage your problem effectively from this clinic.  Once you have seen Tammy and we are sure that we're all on the same page with your medication use she will arrange follow up with me.  Late add if not using hfa effectively change to perforomst/ bud and spiriva dpi/  If still coughing try diovan instead of cozar

## 2013-08-11 NOTE — Assessment & Plan Note (Addendum)
-   started breo 05/02/13 > d/c 06/06/13 and replaced with symbicort  - hfa 75%  06/06/13 - PFT's 06/06/13  FEV1 1.59 (47%) ratio 62 and 13% better p breo am ov test, DLC0 82 corrects to 113%  DDX of  difficult airways managment all start with A and  include Adherence, Ace Inhibitors, Acid Reflux, Active Sinus Disease, Alpha 1 Antitripsin deficiency, Anxiety masquerading as Airways dz,  ABPA,  allergy(esp in young), Aspiration (esp in elderly), Adverse effects of DPI,  Active smokers, plus two Bs  = Bronchiectasis and Beta blocker use..and one C= CHF  Adherence is always the initial "prime suspect" and is a multilayered concern that requires a "trust but verify" approach in every patient - starting with knowing how to use medications, especially inhalers, correctly, keeping up with refills and understanding the fundamental difference between maintenance and prns vs those medications only taken for a very short course and then stopped and not refilled.  - needs med reconciliation/ med calendar > return in 2 weeks - The proper method of use, as well as anticipated side effects, of a metered-dose inhaler are discussed and demonstrated to the patient. Improved effectiveness after extensive coaching during this visit to a level of approximately  50% with baseline of near zero so unless masters this needs neb maint. In meantime can use saba neb up to q 4h  ? Acid (or non-acid) GERD > always difficult to exclude as up to 75% of pts in some series report no assoc GI/ Heartburn symptoms> rec max (24h)  acid suppression and diet restrictions/ reviewed and instructions given in writing  ? CHF> last bnp was only 5 in Oct with similar symptoms so seems unlikely     Each maintenance medication was reviewed in detail including most importantly the difference between maintenance and as needed and under what circumstances the prns are to be used.  Please see instructions for details which were reviewed in writing and  the patient given a copy.

## 2013-08-11 NOTE — Assessment & Plan Note (Addendum)
05/16/13  Patient Saturations on Room Air at Rest = 91% Patient Saturations on [employer] while Ambulating = 87% Patient Saturations on 2 Liters of oxygen while Ambulating = 93%  - 05/02/2013   Walked RA x 10 ft stopped due to  sats down to 87%  -  06/07/13 ONO RA/Cpap desat  > 4h at < 88%  rec repeat on 2lpm / cpap - rec repeat ono 2lpm/cpap  May need change to bipap to help diaphragm given eventration on R and progressive wt gain, in meantime advised to raise [facility]

## 2013-08-15 DIAGNOSIS — E291 Testicular hypofunction: Secondary | ICD-10-CM | POA: Diagnosis not present

## 2013-08-21 ENCOUNTER — Telehealth: Payer: Self-pay | Admitting: Internal Medicine

## 2013-08-21 ENCOUNTER — Encounter: Payer: Self-pay | Admitting: Internal Medicine

## 2013-08-21 NOTE — Telephone Encounter (Signed)
Per MW- ONO on 2LPM and CPAP was normal  Pt should continue this   Spoke with pt and notified of results per Dr. [name]. Pt verbalized understanding and denied any questions.

## 2013-08-25 ENCOUNTER — Encounter: Payer: Self-pay | Admitting: Adult Health

## 2013-08-25 ENCOUNTER — Ambulatory Visit (INDEPENDENT_AMBULATORY_CARE_PROVIDER_SITE_OTHER): Payer: Medicare Other | Admitting: Adult Health

## 2013-08-25 VITALS — BP 128/74 | HR 63 | Temp 97.7°F | Ht 71.5 in | Wt 350.8 lb

## 2013-08-25 DIAGNOSIS — J449 Chronic obstructive pulmonary disease, unspecified: Secondary | ICD-10-CM | POA: Diagnosis not present

## 2013-08-25 DIAGNOSIS — J9610 Chronic respiratory failure, unspecified whether with hypoxia or hypercapnia: Secondary | ICD-10-CM | POA: Diagnosis not present

## 2013-08-25 MED ORDER — TIOTROPIUM BROMIDE MONOHYDRATE 2.5 MCG/ACT IN AERS: 2.0000 | INHALATION_SPRAY | Freq: Every morning | RESPIRATORY_TRACT | Status: DC

## 2013-08-25 NOTE — Addendum Note (Signed)
Addended by: Parke Poisson E on: 08/25/2013 05:33 PM   Modules accepted: Orders

## 2013-08-25 NOTE — Assessment & Plan Note (Signed)
Cont w/ O2 At bedtime  Q/ CPAP and with walking

## 2013-08-25 NOTE — Assessment & Plan Note (Signed)
Improved control on current regimen. Patient's medications were reviewed today and patient education was given. Computerized medication calendar was adjusted/completed   Patient is to follow back up in our office in 2 months and as needed

## 2013-08-25 NOTE — Progress Notes (Signed)
Subjective:    Patient ID: Shane Watson, male    DOB: 11-18-1945   MRN: [id number]    Brief patient profile:  75 yowm  Quit smoking 1987 at wt = 240   Referred 05/02/2013 by [name] for low 02. 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    History of Present Illness  05/02/2013 1st [facility] office visit/ [name] cc indolent onset progressive doe x sev years with w/u by [name] with stent seemed to help at that point and no change uses HC sticker to go shopping and leans on cart at HT due to back but  does do some hunting walking x sev hundred feet.  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 Follow-up    PFT done today.  Discuss O2  Sitting still runs in 90s on no 02 Walking it runs 87 on no 02 breo can't tell difference vs before, no change in need for saba, worse with "bad cold" rx [name] one week prior to OV  > improved back to baseline doe/ saba need rec Stop breo and start Symbicort 160 Take 2 puffs first thing in am and then another 2 puffs about 12 hours later.  Only use your albuterol as a rescue medication   Work on inhaler technique:    Always wear the 02 at bedtime and when attempting to exert (keeping the sats above 90%)    08/10/2013 f/u ov/[name] re:  COPD GOLD II / cpap and 02 at hs Chief Complaint  Patient presents with  . Acute Visit    Pt c/o chest congestion, wheezing, prod cough with gray/white mucous X71month.  Now has neb worked a little bit, last used 2 h prior to OV , no longer using gerd rx. Onset of decline was gradual, progressive, cough can be severe/ hacking   >>start spiriva   08/25/2013 Follow up and Med review  Patient returns for a followup and medication review We reviewed all his medications and organized them into a medication calendar with patient education. Appears that he is taking his medications correctly. Patient was seen last visit and started on Spiriva, which he believes has helped. He has had decreased cough, shortness, of breath and wheezing. Still has some residual symptoms, but much improved since last visit. Denies any hemoptysis, chest pain, orthopnea, PND, or increased leg swelling.    Current Medications, Allergies, Complete Past Medical History, Past Surgical History, Family History, and Social History were reviewed in [employer] record.  ROS  The following are not active complaints unless bolded sore throat, dysphagia, dental problems, itching, sneezing,  nasal congestion or excess/ purulent secretions, ear ache,   fever, chills, sweats, unintended wt loss, pleuritic or exertional cp, hemoptysis,  orthopnea pnd  presyncope, palpitations, heartburn, abdominal pain, anorexia, nausea, vomiting, diarrhea  or change in bowel or urinary habits, change in stools or urine, dysuria,hematuria,  rash, arthralgias, visual complaints, headache, numbness weakness or ataxia or problems with walking or coordination,  change in mood/affect or memory.  Objective:   Physical Exam  08/10/2013      352 > 350 08/25/2013    amb massively obese wm with sats 93%  HEENT: nl dentition, turbinates, and orophanx. Nl external ear canals without cough reflex   NECK :  without JVD/Nodes/TM/ nl carotid upstrokes bilaterally   LUNGS: no acc muscle use, clear to A and P bilaterally without cough on insp or exp maneuvers   CV:  RRR  no s3 or murmur or increase in P2, wearing TEDS with bilateral severe pitting edema with chronic venous stasis changes   ABD:  Massively obese soft and nontender with nl  excursion in the supine position. No bruits or organomegaly, bowel sounds nl  MS:  warm without deformities, calf tenderness, cyanosis or clubbing  SKIN: warm and dry without lesions        08/07/13 cxr 1. No acute findings.  2. Right upper lobe nodule described on 10/12/2012 is not readily  appreciated         Assessment & Plan:

## 2013-08-25 NOTE — Patient Instructions (Signed)
Continue on Symbicort, and Spiriva. Follow med calendar closely and bring to each visit. Follow up Dr. [name] in 2 months and as needed.

## 2013-08-28 NOTE — Addendum Note (Signed)
Addended by: Parke Poisson E on: 08/28/2013 11:03 AM   Modules accepted: Orders, Medications

## 2013-08-31 DIAGNOSIS — H04129 Dry eye syndrome of unspecified lacrimal gland: Secondary | ICD-10-CM | POA: Diagnosis not present

## 2013-08-31 DIAGNOSIS — R252 Cramp and spasm: Secondary | ICD-10-CM | POA: Diagnosis not present

## 2013-09-11 DIAGNOSIS — E291 Testicular hypofunction: Secondary | ICD-10-CM | POA: Diagnosis not present

## 2013-09-28 ENCOUNTER — Other Ambulatory Visit: Payer: Self-pay | Admitting: Family Medicine

## 2013-09-28 DIAGNOSIS — R911 Solitary pulmonary nodule: Secondary | ICD-10-CM

## 2013-09-29 ENCOUNTER — Encounter: Payer: Self-pay | Admitting: Internal Medicine

## 2013-10-02 DIAGNOSIS — E291 Testicular hypofunction: Secondary | ICD-10-CM | POA: Diagnosis not present

## 2013-10-11 ENCOUNTER — Ambulatory Visit (INDEPENDENT_AMBULATORY_CARE_PROVIDER_SITE_OTHER): Payer: Medicare Other | Admitting: Cardiology

## 2013-10-11 ENCOUNTER — Encounter: Payer: Self-pay | Admitting: Cardiology

## 2013-10-11 VITALS — BP 171/81 | HR 67 | Ht 71.5 in | Wt 350.0 lb

## 2013-10-11 DIAGNOSIS — J449 Chronic obstructive pulmonary disease, unspecified: Secondary | ICD-10-CM

## 2013-10-11 DIAGNOSIS — I10 Essential (primary) hypertension: Secondary | ICD-10-CM | POA: Diagnosis not present

## 2013-10-11 DIAGNOSIS — I25810 Atherosclerosis of coronary artery bypass graft(s) without angina pectoris: Secondary | ICD-10-CM | POA: Diagnosis not present

## 2013-10-11 DIAGNOSIS — G4733 Obstructive sleep apnea (adult) (pediatric): Secondary | ICD-10-CM | POA: Diagnosis not present

## 2013-10-11 NOTE — Patient Instructions (Signed)
Your physician recommends that you continue on your current medications as directed. Please refer to the Current Medication list given to you today.  Your physician wants you to follow-up in: 6 months with Dr. Skains. You will receive a reminder letter in the mail two months in advance. If you don't receive a letter, please call our office to schedule the follow-up appointment.  

## 2013-10-11 NOTE — Progress Notes (Signed)
[facility]. [address], [address], [place]  [address] Phone: [phone] Fax:  [phone]  Date:  10/11/2013   ID:  Shane Watson, DOB 1945-07-25, MRN [id number]  PCP:  Shirline Frees, MD   History of Present Illness: Shane Watson is a 68 y.o. male with coronary artery disease with percutaneous intervention DES x2 to the mid and proximal LAD 12/18/10 here for followup.  Saw Dr. [name] for chronic bronchitis. Checked oxygen and prescribed for a while. Now only wears when exerts himself. Sleeps with O2 tank. OSA, CPAP.   Wears compression hose for edema.   Had heart rate of 164 at one point. NTG stopped the palpitations he states. He has not had any episodes since. No chest pain.   Wt Readings from Last 3 Encounters:  10/11/13 350 lb (158.759 kg)  08/25/13 350 lb 12.8 oz (159.122 kg)  08/10/13 352 lb (159.666 kg)     Past Medical History  Diagnosis Date  . Allergy   . Anemia   . Neuropathy     Past Surgical History  Procedure Laterality Date  . Cholecystectomy      Current Outpatient Prescriptions  Medication Sig Dispense Refill  . acetaminophen (TYLENOL) 325 MG tablet Per bottle as needed for pain      . albuterol (PROVENTIL HFA;VENTOLIN HFA) 108 (90 BASE) MCG/ACT inhaler Inhale 2 puffs into the lungs every 4 (four) hours as needed for wheezing or shortness of breath (((PLAN A))).       Marland Kitchen albuterol (PROVENTIL) (5 MG/ML) 0.5% nebulizer solution Take 2.5 mg by nebulization every 4 (four) hours as needed for wheezing or shortness of breath (((PLAN B))).       Marland Kitchen aspirin 81 MG tablet Take 81 mg by mouth daily.      Marland Kitchen atorvastatin (LIPITOR) 40 MG tablet Take 40 mg by mouth at bedtime.       . budesonide-formoterol (SYMBICORT) 160-4.5 MCG/ACT inhaler Take 2 puffs first thing in am and then another 2 puffs about 12 hours later.  1 Inhaler  12  . cetirizine (ZYRTEC) 10 MG tablet Take 10 mg by mouth every morning.       . clopidogrel (PLAVIX) 75 MG tablet Take 75 mg  by mouth daily.      . colesevelam (WELCHOL) 625 MG tablet Take 625 mg by mouth daily with breakfast.       . erythromycin ophthalmic ointment Apply to eye lids daily as needed for eye irritation      . famotidine (PEPCID) 20 MG tablet One at bedtime  30 tablet  2  . furosemide (LASIX) 20 MG tablet Take 20 mg by mouth 2 (two) times daily.      Marland Kitchen gabapentin (NEURONTIN) 100 MG capsule 3 capsules by mouth twice daily      . Guaifenesin (MUCINEX MAXIMUM STRENGTH) 1200 MG TB12 Take 1 tablet by mouth every 12 (twelve) hours as needed (cough/congestion).      Marland Kitchen losartan (COZAAR) 50 MG tablet Take 50 mg by mouth daily.      . meclizine (ANTIVERT) 12.5 MG tablet 2 tabs by mouth every morning      . metoprolol tartrate (LOPRESSOR) 25 MG tablet Take 25 mg by mouth daily.      . Multiple Vitamin (MULTIVITAMIN WITH MINERALS) TABS tablet Take 1 tablet by mouth at bedtime.       . nitroGLYCERIN (NITROSTAT) 0.4 MG SL tablet Place 1 tablet (0.4 mg total) under  the tongue every 5 (five) minutes as needed for chest pain.  60 tablet  3  . OVER THE COUNTER MEDICATION Beclomethasone Cream: apply at bedtime as needed for rash      . pantoprazole (PROTONIX) 40 MG tablet Take 1 tablet (40 mg total) by mouth daily. Take 30-60 min before first meal of the day  30 tablet  2  . potassium chloride SA (K-DUR,KLOR-CON) 20 MEQ tablet Take 1 tablet (20 mEq total) by mouth daily.  30 tablet  6  . Saline (AYR SALINE NASAL DROPS) 0.65 % (SOLN) SOLN Use as needed for nasal congestion      . simethicone (MYLICON) 80 MG chewable tablet Per box as needed for gas      . Tiotropium Bromide Monohydrate (SPIRIVA RESPIMAT) 2.5 MCG/ACT AERS Inhale 2 puffs into the lungs every morning.  4 g  5   No current facility-administered medications for this visit.    Allergies:    Allergies  Allergen Reactions  . Sulfa Antibiotics Hives  . Aspirin Swelling  . Penicillins Hives    Social History:  The patient  reports that he quit smoking  about 20 years ago. His smoking use included Cigarettes. He has a 22.5 pack-year smoking history. He does not have any smokeless tobacco history on file. He reports that he does not drink alcohol or use illicit drugs.   ROS:  Please see the history of present illness.   PHYSICAL EXAM: VS:  BP 171/81  Pulse 67  Ht 5' 11.5" (1.816 m)  Wt 350 lb (158.759 kg)  BMI 48.14 kg/m2 Well nourished, well developed, in no acute distress HEENT: normal thick neck Neck: no JVD Cardiac:  normal S1, S2; RRR; no murmur Lungs:  clear to auscultation bilaterally, no wheezing, rhonchi or rales Abd: soft, nontender, no hepatomegalyMorbidly obese Ext: no edema Skin: warm and dry Neuro: no focal abnormalities noted  EKG:   10/11/13-normal sinus rhythm, 66, no other changes.   Labs: LDL 2013 was 54. Repeating with Dr. Kenton Kingfisher  ASSESSMENT AND PLAN:  1. Coronary artery disease-prior percutaneous intervention. 2. Hypertension-elevated today. Continue current medications. Usually under good control. Monitor.  3. Morbid obesity-strongly encourage weight loss. 4. COPD - Dr. [name]. O2.  5. OSA - CPAP with O2 6. Hyperlipidemia - Lipitor, LDL goal 70. At goal previously. 7. 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 6 month followup  Signed, Candee Furbish, MD [facility]  10/11/2013 2:33 PM

## 2013-10-17 ENCOUNTER — Encounter: Payer: Self-pay | Admitting: Internal Medicine

## 2013-10-17 ENCOUNTER — Ambulatory Visit: Payer: Medicare Other | Admitting: Internal Medicine

## 2013-10-17 VITALS — BP 122/82 | HR 68 | Temp 97.8°F | Wt 351.0 lb

## 2013-10-17 DIAGNOSIS — E785 Hyperlipidemia, unspecified: Secondary | ICD-10-CM | POA: Diagnosis not present

## 2013-10-17 DIAGNOSIS — J449 Chronic obstructive pulmonary disease, unspecified: Secondary | ICD-10-CM

## 2013-10-17 DIAGNOSIS — Z23 Encounter for immunization: Secondary | ICD-10-CM | POA: Diagnosis not present

## 2013-10-17 DIAGNOSIS — I10 Essential (primary) hypertension: Secondary | ICD-10-CM | POA: Diagnosis not present

## 2013-10-17 DIAGNOSIS — G609 Hereditary and idiopathic neuropathy, unspecified: Secondary | ICD-10-CM | POA: Diagnosis not present

## 2013-10-17 DIAGNOSIS — N183 Chronic kidney disease, stage 3 unspecified: Secondary | ICD-10-CM | POA: Diagnosis not present

## 2013-10-17 DIAGNOSIS — I2510 Atherosclerotic heart disease of native coronary artery without angina pectoris: Secondary | ICD-10-CM | POA: Diagnosis not present

## 2013-10-17 DIAGNOSIS — J9610 Chronic respiratory failure, unspecified whether with hypoxia or hypercapnia: Secondary | ICD-10-CM

## 2013-10-17 DIAGNOSIS — E291 Testicular hypofunction: Secondary | ICD-10-CM | POA: Diagnosis not present

## 2013-10-17 NOTE — Assessment & Plan Note (Addendum)
-   started breo 05/02/13 > d/c 06/06/13 and replaced with symbicort   - PFT's 06/06/13  FEV1 1.59 (47%) ratio 62 and 13% better despite  breo am ov test, DLC0 82 corrects to 113%  DDX of  difficult airways managment all start with A and  include Adherence, Ace Inhibitors, Acid Reflux, Active Sinus Disease, Alpha 1 Antitripsin deficiency, Anxiety masquerading as Airways dz,  ABPA,  allergy(esp in young), Aspiration (esp in elderly), Adverse effects of DPI,  Active smokers, plus two Bs  = Bronchiectasis and Beta blocker use..and one C= CHF   Adherence is always the initial "prime suspect" and is a multilayered concern that requires a "trust but verify" approach in every patient - starting with knowing how to use medications, especially inhalers, correctly, keeping up with refills and understanding the fundamental difference between maintenance and prns vs those medications only taken for a very short course and then stopped and not refilled.  The proper method of use, as well as anticipated side effects, of a metered-dose inhaler are discussed and demonstrated to the patient. Improved effectiveness after extensive coaching during this visit to a level of approximately  75% from a baseline of < 50% so needs to keep working on it  ? Chf/ def vol overload > defer to cards    Each maintenance medication was reviewed in detail including most importantly the difference between maintenance and as needed and under what circumstances the prns are to be used. This was done in the context of a medication calendar review which provided the patient with a user-friendly unambiguous mechanism for medication administration and reconciliation and provides an action plan for all active problems. It is critical that this be shown to every doctor  for modification during the office visit if necessary so the patient can use it as a working document.

## 2013-10-17 NOTE — Assessment & Plan Note (Signed)
05/16/13  Patient Saturations on Room Air at Rest = 91% Patient Saturations on [employer] while Ambulating = 87% Patient Saturations on 2 Liters of oxygen while Ambulating = 93%  - 05/02/2013   Walked RA x 10 ft stopped due to  sats down to 87%  -  06/07/13 ONO RA/Cpap desat  > 4h at < 88%  rec repeat on 2lpm / cpap - rec repeat ono 2lpm/cpap  Done 08/17/13 > no desat   rx is 02 2lpm at hs and with exertion   Emphasized need to get in and maintain a neg cal balance consistently   See instructions for specific recommendations which were reviewed directly with the patient who was given a copy with highlighter outlining the key components.

## 2013-10-17 NOTE — Progress Notes (Signed)
Subjective:    Patient ID: Shane Watson, male    DOB: 1946/03/08   MRN: [id number]    Brief patient profile:  19 yowm  Quit smoking 1987 at wt = 240   Referred 05/02/2013 by [name] for low 02. 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 Follow-up    PFT done today.  Discuss O2  Sitting still runs in 90s on no 02 Walking it runs 87 on no 02 breo can't tell difference vs before, no change in need for saba, worse with "bad cold" rx [name] one week prior to OV  > improved back to baseline doe/ saba need rec Stop breo and start Symbicort 160 Take 2 puffs first thing in am and then another 2 puffs about 12 hours later.  Only use your albuterol as a rescue medication   Work on inhaler technique:    Always wear the 02 at bedtime and when attempting to exert (keeping the sats above 90%)    08/10/2013 f/u ov/[name] re:  COPD GOLD II / cpap and 02 at hs Chief Complaint  Patient presents with  . Acute Visit    Pt c/o chest congestion, wheezing, prod cough with gray/white mucous X69month.  Now has neb worked a little bit, last used 2 h prior to OV , no longer using gerd rx. Onset of decline was gradual, progressive,  cough can be severe/ hacking  rec Work on inhaler technique:  start spiriva 2 puffs each am Continue symbicort 160 Take 2 puffs first thing in am and then another 2 puffs about 12 hours later.  Only use your albuterol as a rescue medication   Pantoprazole (protonix) 40 mg   Take 30-60 min before first meal of the day and Pepcid 20 mg one bedtime until return to office - this is the best way to tell whether stomach acid is contributing to your problem.      08/25/2013 Follow up and Med review  Patient returns for a followup and medication review We reviewed all his medications and organized them into a medication calendar with patient education. Appears that he is taking his medications correctly. Patient was seen last visit and started on Spiriva, which he believes has helped. He has had decreased cough, shortness, of breath and wheezing. Still has some residual symptoms, but much improved since last visit. Denies any hemoptysis, chest pain, orthopnea, PND, or increased leg swelling. rec No change rx   10/17/2013 f/u ov/Dorethia Jeanmarie re: GOLD II COPD on maint spriva and symbicort  Chief Complaint  Patient presents with  . COPD    Breathing is improved since last  OV. Reports DOE, coughing with production of white mucus. Denies chest tightness or wheezing.   On 02 can walk ok 2lpm x a mall  But really not very active  Uses saba hfa maybe once a day, never saba neb Most of his cough/ congestion in ams never purulent   No obvious day to day or daytime variabilty or assoc cp or chest tightness, subjective wheeze overt sinus or hb symptoms. No unusual exp hx or h/o childhood pna/ asthma or knowledge of premature birth.  Sleeping ok without nocturnal  or early am exacerbation  of respiratory  c/o's or need for noct saba. Also denies any obvious fluctuation of symptoms with weather or environmental changes or other aggravating or alleviating factors except as outlined above   Current Medications,  Allergies, Complete Past Medical History, Past Surgical History, Family History, and Social History were reviewed in [employer] record.  ROS  The following are not active complaints unless bolded sore throat, dysphagia, dental problems, itching, sneezing,  nasal congestion or excess/ purulent secretions, ear ache,   fever, chills, sweats, unintended wt loss, pleuritic or exertional cp, hemoptysis,  orthopnea pnd or leg swelling, presyncope, palpitations, heartburn, abdominal pain, anorexia, nausea, vomiting, diarrhea  or change in bowel or urinary habits, change in stools or urine, dysuria,hematuria,  rash, arthralgias, visual complaints, headache, numbness weakness or ataxia or problems with walking or coordination,  change in mood/affect or memory.                    Objective:   Physical Exam  08/10/2013      352 > 350 08/25/2013 > 10/17/2013 351    amb massively obese wm with sats 93%  HEENT: nl dentition, turbinates, and orophanx. Nl external ear canals without cough reflex   NECK :  without JVD/Nodes/TM/ nl carotid upstrokes bilaterally   LUNGS: no acc muscle use, clear to A and P bilaterally with trace wheeze mid exp    CV:  RRR  no s3 or murmur or increase in P2, wearing TEDS with bilateral 2+ pitting edema with chronic venous stasis changes   ABD:  Massively obese soft and nontender with nl excursion in the supine position. No bruits or organomegaly, bowel sounds nl  MS:  warm without deformities, calf tenderness, cyanosis or clubbing  SKIN: warm and dry without lesions        08/07/13 cxr 1. No acute findings.  2. Right upper lobe nodule described on 10/12/2012 is not readily  appreciated         Assessment & Plan:

## 2013-10-17 NOTE — Patient Instructions (Addendum)
Work on inhaler technique:  relax and gently blow all the way out then take a nice smooth deep breath back in, triggering the inhaler at same time you start breathing in.  Hold for up to 5 seconds if you can.  Rinse and gargle with water when done   If your mouth or throat starts to bother you,   I suggest you time the inhaler to your dental care and after using the inhaler(s) brush teeth and tongue with a baking soda containing toothpaste and when you rinse this out, gargle with it first to see if this helps your mouth and throat.    Weight control is simply a matter of calorie balance which needs to be tilted in your favor by eating less and exercising more.  To get the most out of exercise, you need to be continuously aware that you are short of breath, but never out of breath, for 30 minutes daily. As you improve, it will actually be easier for you to do the same amount of exercise  in  30 minutes so always push to the level where you are short of breath.  If this does not result in gradual weight reduction then I strongly recommend you see a nutritionist with a food diary x 2 weeks so that we can work out a negative calorie balance which is universally effective in steady weight loss programs.  Think of your calorie balance like you do your bank account where in this case you want the balance to go down so you must take in less calories than you burn up.  It's just that simple:  Hard to do, but easy to understand.  Good luck!   Please schedule a follow up visit in 3 months but call sooner if needed with all inhalers in hand

## 2013-10-20 ENCOUNTER — Ambulatory Visit
Admission: RE | Admit: 2013-10-20 | Discharge: 2013-10-20 | Disposition: A | Discharge status: Home / Self Care | Payer: Medicare Other | Source: Ambulatory Visit | Attending: Family Medicine | Admitting: Family Medicine

## 2013-10-20 DIAGNOSIS — R911 Solitary pulmonary nodule: Secondary | ICD-10-CM

## 2013-10-20 DIAGNOSIS — J984 Other disorders of lung: Secondary | ICD-10-CM | POA: Diagnosis not present

## 2013-10-24 DIAGNOSIS — E291 Testicular hypofunction: Secondary | ICD-10-CM | POA: Diagnosis not present

## 2013-11-01 ENCOUNTER — Encounter: Payer: Self-pay | Admitting: Internal Medicine

## 2013-11-01 DIAGNOSIS — R918 Other nonspecific abnormal finding of lung field: Secondary | ICD-10-CM | POA: Insufficient documentation

## 2013-11-03 ENCOUNTER — Other Ambulatory Visit: Payer: Self-pay | Admitting: Internal Medicine

## 2013-11-09 DIAGNOSIS — H6120 Impacted cerumen, unspecified ear: Secondary | ICD-10-CM | POA: Diagnosis not present

## 2013-11-13 DIAGNOSIS — E291 Testicular hypofunction: Secondary | ICD-10-CM | POA: Diagnosis not present

## 2013-11-28 DIAGNOSIS — E291 Testicular hypofunction: Secondary | ICD-10-CM | POA: Diagnosis not present

## 2013-12-19 DIAGNOSIS — E291 Testicular hypofunction: Secondary | ICD-10-CM | POA: Diagnosis not present

## 2013-12-26 DIAGNOSIS — J449 Chronic obstructive pulmonary disease, unspecified: Secondary | ICD-10-CM | POA: Diagnosis not present

## 2013-12-26 DIAGNOSIS — J069 Acute upper respiratory infection, unspecified: Secondary | ICD-10-CM | POA: Diagnosis not present

## 2013-12-26 DIAGNOSIS — H6690 Otitis media, unspecified, unspecified ear: Secondary | ICD-10-CM | POA: Diagnosis not present

## 2014-01-03 ENCOUNTER — Telehealth: Payer: Self-pay | Admitting: Internal Medicine

## 2014-01-03 MED ORDER — BUDESONIDE-FORMOTEROL FUMARATE 160-4.5 MCG/ACT IN AERO: INHALATION_SPRAY | RESPIRATORY_TRACT | Status: DC

## 2014-01-03 NOTE — Telephone Encounter (Signed)
Form was completed and faxed to [place]  I have made copies and mailed to the pt for his records  Pt aware and nothing further needed

## 2014-01-09 DIAGNOSIS — E291 Testicular hypofunction: Secondary | ICD-10-CM | POA: Diagnosis not present

## 2014-01-15 ENCOUNTER — Ambulatory Visit: Payer: Medicare Other | Admitting: Internal Medicine

## 2014-01-15 ENCOUNTER — Encounter: Payer: Self-pay | Admitting: Internal Medicine

## 2014-01-15 VITALS — BP 150/70 | HR 76 | Temp 98.4°F | Ht 71.0 in | Wt 345.0 lb

## 2014-01-15 DIAGNOSIS — R918 Other nonspecific abnormal finding of lung field: Secondary | ICD-10-CM

## 2014-01-15 DIAGNOSIS — J449 Chronic obstructive pulmonary disease, unspecified: Secondary | ICD-10-CM

## 2014-01-15 NOTE — Patient Instructions (Signed)
Aim for 30 min of exercise on 02 if possible  Call [facility] [phone] if any issues regarding your portable oxygen needs   Consider using  [email or web address] for all your generic medication needs since you are already in the doughnut hole  Please schedule a follow up visit in 4 months but call sooner if needed

## 2014-01-15 NOTE — Assessment & Plan Note (Signed)
05/16/13  Patient Saturations on Room Air at Rest = 91% Patient Saturations on [employer] while Ambulating = 87% Patient Saturations on 2 Liters of oxygen while Ambulating = 93%  - 05/02/2013   Walked RA x 10 ft stopped due to  sats down to 87%  -  06/07/13 ONO RA/Cpap desat  > 4h at < 88%  rec repeat on 2lpm / cpap - rec repeat ono 2lpm/cpap  Done 08/17/13 > no desat   rx is 02 2lpm at hs and with exertion   Adequate control on present rx, reviewed > no change in rx needed

## 2014-01-15 NOTE — Assessment & Plan Note (Signed)
-   started breo 05/02/13 > d/c 06/06/13 and replaced with symbicort   - PFT's 06/06/13  FEV1 1.59 (47%) ratio 62 and 13% better despite  breo am ov test, DLC0 82 corrects to 113%  The proper method of use, as well as anticipated side effects, of a metered-dose inhaler are discussed and demonstrated to the patient. Improved effectiveness after extensive coaching during this visit to a level of approximately  90% and doing well now on spiriva and symbicort with minimal saba need > main challenge now is wt loss.    Each maintenance medication was reviewed in detail including most importantly the difference between maintenance and as needed and under what circumstances the prns are to be used.  Please see instructions for details which were reviewed in writing and the patient given a copy.

## 2014-01-15 NOTE — Assessment & Plan Note (Signed)
.  CT chest 10/20/13 > in tickle for recall 05/03/14

## 2014-01-15 NOTE — Progress Notes (Signed)
Subjective:    Patient ID: Shane Watson, male    DOB: [birth year]   MRN: [id number]    Brief patient profile:  60 yowm  Quit smoking 1987 at wt = 240   Referred 05/02/2013 by [name] for low 02. 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 Follow-up    PFT done today.  Discuss O2  Sitting still runs in 90s on no 02 Walking it runs 87 on no 02 breo can't tell difference vs before, no change in need for saba, worse with "bad cold" rx [name] one week prior to OV  > improved back to baseline doe/ saba need rec Stop breo and start Symbicort 160 Take 2 puffs first thing in am and then another 2 puffs about 12 hours later.  Only use your albuterol as a rescue medication   Work on inhaler technique:    Always wear the 02 at bedtime and when attempting to exert (keeping the sats above 90%)    08/10/2013 f/u ov/[name] re:  COPD GOLD II / cpap and 02 at hs Chief Complaint  Patient presents with  . Acute Visit    Pt c/o chest congestion, wheezing, prod cough with gray/white mucous X45month.  Now has neb worked a little bit, last used 2 h prior to OV , no longer using gerd rx. Onset of decline was gradual, progressive,  cough can be severe/ hacking  rec Work on inhaler technique:  start spiriva 2 puffs each am Continue symbicort 160 Take 2 puffs first thing in am and then another 2 puffs about 12 hours later.  Only use your albuterol as a rescue medication   Pantoprazole (protonix) 40 mg   Take 30-60 min before first meal of the day and Pepcid 20 mg one bedtime until return to office - this is the best way to tell whether stomach acid is contributing to your problem.      08/25/2013 Follow up and Med review  Patient returns for a followup and medication review We reviewed all his medications and organized them into a medication calendar with patient education. Appears that he is taking his medications correctly. Patient was seen last visit and started on Spiriva, which he believes has helped. He has had decreased cough, shortness, of breath and wheezing. Still has some residual symptoms, but much improved since last visit. Denies any hemoptysis, chest pain, orthopnea, PND, or increased leg swelling. rec No change rx   10/17/2013 f/u ov/[name] re: GOLD II COPD on maint spriva and symbicort  Chief Complaint  Patient presents with  . COPD    Breathing is improved since last  OV. Reports DOE, coughing with production of white mucus. Denies chest tightness or wheezing.   On 02 can walk ok 2lpm x a mall  But really not very active  Uses saba hfa maybe once a day, never saba neb Most of his cough/ congestion in ams never purulent  rec Work on inhaler technique/ wt loss    01/15/2014 f/u ov/[name] re: GOLD II copd/ obesity/ on symbicort 160 / spiriva/ Chief Complaint  Patient presents with  . Follow-up    Breathing is overall doing well. He has been using rescue inhaler 1 to 2 x per wk on average and has used neb x 2 in the past month.  overall much improved  No obvious day to day or daytime variabilty or assoc cough cp or chest tightness, subjective wheeze overt sinus or hb symptoms. No unusual exp hx or h/o  childhood pna/ asthma or knowledge of premature birth.  Sleeping ok without nocturnal  or early am exacerbation  of respiratory  c/o's or need for noct saba. Also denies any obvious fluctuation of symptoms with weather or environmental changes or other aggravating or alleviating factors except as outlined above   Current Medications, Allergies, Complete Past Medical History, Past Surgical History, Family History, and Social History were reviewed in [employer] record.  ROS  The following are not active complaints unless bolded sore throat, dysphagia, dental problems, itching, sneezing,  nasal congestion or excess/ purulent secretions, ear ache,   fever, chills, sweats, unintended wt loss, pleuritic or exertional cp, hemoptysis,  orthopnea pnd or leg swelling, presyncope, palpitations, heartburn, abdominal pain, anorexia, nausea, vomiting, diarrhea  or change in bowel or urinary habits, change in stools or urine, dysuria,hematuria,  rash, arthralgias, visual complaints, headache, numbness weakness or ataxia or problems with walking or coordination,  change in mood/affect or memory.                    Objective:   Physical Exam  08/10/2013      352 > 350 08/25/2013 > 10/17/2013 351 > 01/15/2014  345    amb massively obese wm with sats 93%  HEENT: nl dentition, turbinates, and orophanx. Nl external ear canals without cough reflex   NECK :  without JVD/Nodes/TM/ nl carotid upstrokes bilaterally   LUNGS: no acc muscle use, clear to A and P bilaterally with distant bs, no wheeze    CV:  RRR  no s3 or murmur or increase in P2, wearing TEDS with bilateral 2+ pitting edema with chronic venous stasis changes   ABD:  Massively obese soft and nontender with nl excursion in the supine position. No bruits or organomegaly, bowel sounds nl  MS:  warm without deformities, calf tenderness, cyanosis or clubbing  SKIN: warm and dry without lesions        08/07/13 cxr 1. No  acute findings.  2. Right upper lobe nodule described on 10/12/2012 is not readily  appreciated         Assessment & Plan:

## 2014-01-16 ENCOUNTER — Other Ambulatory Visit: Payer: Self-pay | Admitting: Cardiology

## 2014-01-30 ENCOUNTER — Other Ambulatory Visit: Payer: Self-pay | Admitting: Internal Medicine

## 2014-01-30 ENCOUNTER — Telehealth: Payer: Self-pay | Admitting: Internal Medicine

## 2014-01-30 MED ORDER — TIOTROPIUM BROMIDE MONOHYDRATE 2.5 MCG/ACT IN AERS: 2.0000 | INHALATION_SPRAY | Freq: Every morning | RESPIRATORY_TRACT | Status: DC

## 2014-01-30 NOTE — Telephone Encounter (Signed)
Forms found, RX printed for MW to sign, Will fax once done. Pt aware and needed nothing further.

## 2014-02-01 DIAGNOSIS — E291 Testicular hypofunction: Secondary | ICD-10-CM | POA: Diagnosis not present

## 2014-02-15 ENCOUNTER — Other Ambulatory Visit: Payer: Self-pay | Admitting: Internal Medicine

## 2014-02-20 ENCOUNTER — Inpatient Hospital Stay (HOSPITAL_COMMUNITY)
Admission: EM | Admit: 2014-02-20 | Discharge: 2014-02-23 | DRG: 871 | Disposition: A | Discharge status: Home Health | Payer: Medicare Other | Attending: Family Medicine | Admitting: Family Medicine

## 2014-02-20 ENCOUNTER — Ambulatory Visit (INDEPENDENT_AMBULATORY_CARE_PROVIDER_SITE_OTHER): Payer: Medicare Other | Admitting: Internal Medicine

## 2014-02-20 ENCOUNTER — Encounter (HOSPITAL_COMMUNITY): Payer: Self-pay | Admitting: Emergency Medicine

## 2014-02-20 ENCOUNTER — Emergency Department (HOSPITAL_COMMUNITY): Payer: Medicare Other

## 2014-02-20 ENCOUNTER — Encounter: Payer: Self-pay | Admitting: Internal Medicine

## 2014-02-20 VITALS — BP 103/66 | HR 106 | Temp 98.8°F | Resp 16

## 2014-02-20 DIAGNOSIS — Z7982 Long term (current) use of aspirin: Secondary | ICD-10-CM

## 2014-02-20 DIAGNOSIS — J441 Chronic obstructive pulmonary disease with (acute) exacerbation: Secondary | ICD-10-CM | POA: Diagnosis not present

## 2014-02-20 DIAGNOSIS — J9819 Other pulmonary collapse: Secondary | ICD-10-CM | POA: Diagnosis not present

## 2014-02-20 DIAGNOSIS — L03119 Cellulitis of unspecified part of limb: Secondary | ICD-10-CM | POA: Diagnosis not present

## 2014-02-20 DIAGNOSIS — R0602 Shortness of breath: Secondary | ICD-10-CM

## 2014-02-20 DIAGNOSIS — R42 Dizziness and giddiness: Secondary | ICD-10-CM | POA: Diagnosis not present

## 2014-02-20 DIAGNOSIS — R5381 Other malaise: Secondary | ICD-10-CM | POA: Diagnosis not present

## 2014-02-20 DIAGNOSIS — Z9861 Coronary angioplasty status: Secondary | ICD-10-CM | POA: Diagnosis not present

## 2014-02-20 DIAGNOSIS — I129 Hypertensive chronic kidney disease with stage 1 through stage 4 chronic kidney disease, or unspecified chronic kidney disease: Secondary | ICD-10-CM | POA: Diagnosis present

## 2014-02-20 DIAGNOSIS — N184 Chronic kidney disease, stage 4 (severe): Secondary | ICD-10-CM | POA: Diagnosis present

## 2014-02-20 DIAGNOSIS — R5383 Other fatigue: Secondary | ICD-10-CM | POA: Diagnosis not present

## 2014-02-20 DIAGNOSIS — Z833 Family history of diabetes mellitus: Secondary | ICD-10-CM | POA: Diagnosis not present

## 2014-02-20 DIAGNOSIS — I25810 Atherosclerosis of coronary artery bypass graft(s) without angina pectoris: Secondary | ICD-10-CM | POA: Diagnosis present

## 2014-02-20 DIAGNOSIS — Z8249 Family history of ischemic heart disease and other diseases of the circulatory system: Secondary | ICD-10-CM

## 2014-02-20 DIAGNOSIS — L02419 Cutaneous abscess of limb, unspecified: Secondary | ICD-10-CM

## 2014-02-20 DIAGNOSIS — R918 Other nonspecific abnormal finding of lung field: Secondary | ICD-10-CM

## 2014-02-20 DIAGNOSIS — D72829 Elevated white blood cell count, unspecified: Secondary | ICD-10-CM | POA: Diagnosis present

## 2014-02-20 DIAGNOSIS — J9600 Acute respiratory failure, unspecified whether with hypoxia or hypercapnia: Secondary | ICD-10-CM | POA: Diagnosis present

## 2014-02-20 DIAGNOSIS — L03116 Cellulitis of left lower limb: Secondary | ICD-10-CM | POA: Diagnosis present

## 2014-02-20 DIAGNOSIS — E785 Hyperlipidemia, unspecified: Secondary | ICD-10-CM | POA: Diagnosis present

## 2014-02-20 DIAGNOSIS — Z6841 Body Mass Index (BMI) 40.0 and over, adult: Secondary | ICD-10-CM | POA: Diagnosis not present

## 2014-02-20 DIAGNOSIS — D696 Thrombocytopenia, unspecified: Secondary | ICD-10-CM | POA: Diagnosis present

## 2014-02-20 DIAGNOSIS — J4489 Other specified chronic obstructive pulmonary disease: Secondary | ICD-10-CM | POA: Diagnosis present

## 2014-02-20 DIAGNOSIS — J449 Chronic obstructive pulmonary disease, unspecified: Secondary | ICD-10-CM

## 2014-02-20 DIAGNOSIS — A419 Sepsis, unspecified organism: Secondary | ICD-10-CM | POA: Diagnosis present

## 2014-02-20 DIAGNOSIS — R509 Fever, unspecified: Secondary | ICD-10-CM | POA: Diagnosis not present

## 2014-02-20 DIAGNOSIS — R404 Transient alteration of awareness: Secondary | ICD-10-CM | POA: Diagnosis not present

## 2014-02-20 DIAGNOSIS — R7881 Bacteremia: Secondary | ICD-10-CM

## 2014-02-20 DIAGNOSIS — I10 Essential (primary) hypertension: Secondary | ICD-10-CM | POA: Diagnosis present

## 2014-02-20 DIAGNOSIS — Z79899 Other long term (current) drug therapy: Secondary | ICD-10-CM | POA: Diagnosis not present

## 2014-02-20 DIAGNOSIS — G4733 Obstructive sleep apnea (adult) (pediatric): Secondary | ICD-10-CM

## 2014-02-20 DIAGNOSIS — I959 Hypotension, unspecified: Secondary | ICD-10-CM | POA: Diagnosis not present

## 2014-02-20 DIAGNOSIS — L0390 Cellulitis, unspecified: Secondary | ICD-10-CM | POA: Diagnosis present

## 2014-02-20 DIAGNOSIS — Z87891 Personal history of nicotine dependence: Secondary | ICD-10-CM

## 2014-02-20 DIAGNOSIS — J9601 Acute respiratory failure with hypoxia: Secondary | ICD-10-CM | POA: Diagnosis present

## 2014-02-20 HISTORY — DX: Hyperlipidemia, unspecified: E78.5

## 2014-02-20 HISTORY — DX: Chronic obstructive pulmonary disease, unspecified: J44.9

## 2014-02-20 HISTORY — DX: Thyrotoxicosis, unspecified without thyrotoxic crisis or storm: E05.90

## 2014-02-20 HISTORY — DX: Dizziness and giddiness: R42

## 2014-02-20 LAB — POCT CBC
GRANULOCYTE PERCENT: 94.1 % — AB (ref 37–80)
HEMATOCRIT: 44.5 % (ref 43.5–53.7)
HEMOGLOBIN: 14 g/dL — AB (ref 14.1–18.1)
Lymph, poc: 0.6 (ref 0.6–3.4)
MCH, POC: 27.7 pg (ref 27–31.2)
MCHC: 31.5 g/dL — AB (ref 31.8–35.4)
MCV: 87.9 fL (ref 80–97)
MID (cbc): 0.4 (ref 0–0.9)
MPV: 5.7 fL (ref 0–99.8)
POC GRANULOCYTE: 16.4 — AB (ref 2–6.9)
POC LYMPH PERCENT: 3.7 %L — AB (ref 10–50)
POC MID %: 2.2 %M (ref 0–12)
Platelet Count, POC: 138 10*3/uL — AB (ref 142–424)
RBC: 5.06 M/uL (ref 4.69–6.13)
RDW, POC: 16.6 %
WBC: 17.4 10*3/uL — AB (ref 4.6–10.2)

## 2014-02-20 LAB — URINE MICROSCOPIC-ADD ON

## 2014-02-20 LAB — BASIC METABOLIC PANEL
ANION GAP: 11 (ref 5–15)
BUN: 22 mg/dL (ref 6–23)
CHLORIDE: 95 meq/L — AB (ref 96–112)
CO2: 29 mEq/L (ref 19–32)
Calcium: 8.9 mg/dL (ref 8.4–10.5)
Creatinine, Ser: 2.53 mg/dL — ABNORMAL HIGH (ref 0.50–1.35)
GFR calc Af Amer: 28 mL/min — ABNORMAL LOW (ref >90)
GFR, EST NON AFRICAN AMERICAN: 25 mL/min — AB (ref >90)
GLUCOSE: 173 mg/dL — AB (ref 70–99)
POTASSIUM: 5 meq/L (ref 3.7–5.3)
SODIUM: 135 meq/L — AB (ref 137–147)

## 2014-02-20 LAB — URINALYSIS, ROUTINE W REFLEX MICROSCOPIC
Glucose, UA: NEGATIVE mg/dL
HGB URINE DIPSTICK: NEGATIVE
Ketones, ur: NEGATIVE mg/dL
Nitrite: NEGATIVE
PROTEIN: 100 mg/dL — AB
Specific Gravity, Urine: 1.027 (ref 1.005–1.030)
Urobilinogen, UA: 1 mg/dL (ref 0.0–1.0)
pH: 5 (ref 5.0–8.0)

## 2014-02-20 LAB — CBC WITH DIFFERENTIAL/PLATELET
BASOS ABS: 0 10*3/uL (ref 0.0–0.1)
Basophils Relative: 0 % (ref 0–1)
Eosinophils Absolute: 0 10*3/uL (ref 0.0–0.7)
Eosinophils Relative: 0 % (ref 0–5)
HCT: 43.9 % (ref 39.0–52.0)
Hemoglobin: 14.1 g/dL (ref 13.0–17.0)
Lymphocytes Relative: 3 % — ABNORMAL LOW (ref 12–46)
Lymphs Abs: 0.5 10*3/uL — ABNORMAL LOW (ref 0.7–4.0)
MCH: 28.7 pg (ref 26.0–34.0)
MCHC: 32.1 g/dL (ref 30.0–36.0)
MCV: 89.4 fL (ref 78.0–100.0)
MONO ABS: 1.1 10*3/uL — AB (ref 0.1–1.0)
MONOS PCT: 6 % (ref 3–12)
NEUTROS ABS: 16.2 10*3/uL — AB (ref 1.7–7.7)
Neutrophils Relative %: 91 % — ABNORMAL HIGH (ref 43–77)
Platelets: 101 10*3/uL — ABNORMAL LOW (ref 150–400)
RBC: 4.91 MIL/uL (ref 4.22–5.81)
RDW: 15 % (ref 11.5–15.5)
WBC: 17.9 10*3/uL — ABNORMAL HIGH (ref 4.0–10.5)

## 2014-02-20 LAB — I-STAT CG4 LACTIC ACID, ED: Lactic Acid, Venous: 2.88 mmol/L — ABNORMAL HIGH (ref 0.5–2.2)

## 2014-02-20 LAB — GLUCOSE, POCT (MANUAL RESULT ENTRY): POC GLUCOSE: 248 mg/dL — AB (ref 70–99)

## 2014-02-20 MED ORDER — TIZANIDINE HCL 4 MG PO TABS
4.0000 mg | ORAL_TABLET | Freq: Two times a day (BID) | ORAL | Status: DC
  Administered 2014-02-20 – 2014-02-23 (×6): 4 mg via ORAL
  Filled 2014-02-20 (×7): qty 1

## 2014-02-20 MED ORDER — VANCOMYCIN HCL 10 G IV SOLR: 2000.0000 mg | INTRAVENOUS | Status: DC

## 2014-02-20 MED ORDER — SODIUM CHLORIDE 0.9 % IV BOLUS (SEPSIS)
500.0000 mL | Freq: Once | INTRAVENOUS | Status: AC
  Administered 2014-02-20: 500 mL via INTRAVENOUS

## 2014-02-20 MED ORDER — ONDANSETRON HCL 4 MG/2ML IJ SOLN: 4.0000 mg | Freq: Four times a day (QID) | INTRAMUSCULAR | Status: DC | PRN

## 2014-02-20 MED ORDER — SODIUM CHLORIDE 0.9 % IJ SOLN
3.0000 mL | Freq: Two times a day (BID) | INTRAMUSCULAR | Status: DC
  Administered 2014-02-20 – 2014-02-23 (×5): 3 mL via INTRAVENOUS

## 2014-02-20 MED ORDER — GABAPENTIN 300 MG PO CAPS
300.0000 mg | ORAL_CAPSULE | Freq: Two times a day (BID) | ORAL | Status: DC
  Administered 2014-02-20 – 2014-02-23 (×6): 300 mg via ORAL
  Filled 2014-02-20 (×7): qty 1

## 2014-02-20 MED ORDER — OXYCODONE HCL 5 MG PO TABS: 5.0000 mg | ORAL_TABLET | ORAL | Status: DC | PRN

## 2014-02-20 MED ORDER — ALBUTEROL SULFATE (2.5 MG/3ML) 0.083% IN NEBU: 2.5000 mg | INHALATION_SOLUTION | RESPIRATORY_TRACT | Status: DC | PRN

## 2014-02-20 MED ORDER — PANTOPRAZOLE SODIUM 40 MG PO TBEC
40.0000 mg | DELAYED_RELEASE_TABLET | Freq: Every day | ORAL | Status: DC
  Administered 2014-02-21 – 2014-02-23 (×3): 40 mg via ORAL
  Filled 2014-02-20 (×3): qty 1

## 2014-02-20 MED ORDER — SODIUM CHLORIDE 0.9 % IV SOLN
INTRAVENOUS | Status: AC
  Administered 2014-02-20 – 2014-02-21 (×2): via INTRAVENOUS

## 2014-02-20 MED ORDER — COLESEVELAM HCL 625 MG PO TABS
625.0000 mg | ORAL_TABLET | Freq: Every day | ORAL | Status: DC
  Administered 2014-02-21 – 2014-02-23 (×3): 625 mg via ORAL
  Filled 2014-02-20 (×4): qty 1

## 2014-02-20 MED ORDER — MAGNESIUM OXIDE 400 (241.3 MG) MG PO TABS
400.0000 mg | ORAL_TABLET | Freq: Every day | ORAL | Status: DC
  Administered 2014-02-21 – 2014-02-23 (×3): 400 mg via ORAL
  Filled 2014-02-20 (×3): qty 1

## 2014-02-20 MED ORDER — TIOTROPIUM BROMIDE MONOHYDRATE 2.5 MCG/ACT IN AERS: 2.0000 | INHALATION_SPRAY | Freq: Every morning | RESPIRATORY_TRACT | Status: DC

## 2014-02-20 MED ORDER — ENOXAPARIN SODIUM 40 MG/0.4ML ~~LOC~~ SOLN
40.0000 mg | SUBCUTANEOUS | Status: DC
Start: 2014-02-20 — End: 2014-02-20
  Administered 2014-02-20: 40 mg via SUBCUTANEOUS
  Filled 2014-02-20: qty 0.4

## 2014-02-20 MED ORDER — SODIUM CHLORIDE 0.9 % IV SOLN
Freq: Once | INTRAVENOUS | Status: AC
  Administered 2014-02-20: 17:00:00 via INTRAVENOUS

## 2014-02-20 MED ORDER — NITROGLYCERIN 0.4 MG SL SUBL: 0.4000 mg | SUBLINGUAL_TABLET | SUBLINGUAL | Status: DC | PRN

## 2014-02-20 MED ORDER — LORATADINE 10 MG PO TABS
10.0000 mg | ORAL_TABLET | Freq: Every day | ORAL | Status: DC
  Administered 2014-02-21 – 2014-02-23 (×3): 10 mg via ORAL
  Filled 2014-02-20 (×3): qty 1

## 2014-02-20 MED ORDER — ACETAMINOPHEN 325 MG PO TABS
325.0000 mg | ORAL_TABLET | ORAL | Status: DC | PRN
  Administered 2014-02-20 – 2014-02-23 (×4): 325 mg via ORAL
  Filled 2014-02-20 (×4): qty 1

## 2014-02-20 MED ORDER — HYDROMORPHONE HCL PF 1 MG/ML IJ SOLN: 1.0000 mg | INTRAMUSCULAR | Status: DC | PRN

## 2014-02-20 MED ORDER — ACETAMINOPHEN 325 MG PO TABS
650.0000 mg | ORAL_TABLET | Freq: Once | ORAL | Status: AC
  Administered 2014-02-20: 650 mg via ORAL
  Filled 2014-02-20: qty 2

## 2014-02-20 MED ORDER — GUAIFENESIN ER 600 MG PO TB12
1200.0000 mg | ORAL_TABLET | Freq: Two times a day (BID) | ORAL | Status: DC
  Administered 2014-02-20 – 2014-02-23 (×6): 1200 mg via ORAL
  Filled 2014-02-20 (×7): qty 2

## 2014-02-20 MED ORDER — VANCOMYCIN HCL 10 G IV SOLR
2500.0000 mg | INTRAVENOUS | Status: AC
  Administered 2014-02-20: 2500 mg via INTRAVENOUS
  Filled 2014-02-20: qty 2500

## 2014-02-20 MED ORDER — ATORVASTATIN CALCIUM 40 MG PO TABS
40.0000 mg | ORAL_TABLET | Freq: Every day | ORAL | Status: DC
  Administered 2014-02-20 – 2014-02-22 (×3): 40 mg via ORAL
  Filled 2014-02-20 (×4): qty 1

## 2014-02-20 MED ORDER — BUDESONIDE-FORMOTEROL FUMARATE 160-4.5 MCG/ACT IN AERO
2.0000 | INHALATION_SPRAY | Freq: Two times a day (BID) | RESPIRATORY_TRACT | Status: DC
  Administered 2014-02-20 – 2014-02-23 (×5): 2 via RESPIRATORY_TRACT
  Filled 2014-02-20 (×2): qty 6

## 2014-02-20 MED ORDER — ASPIRIN 81 MG PO CHEW
81.0000 mg | CHEWABLE_TABLET | Freq: Every day | ORAL | Status: DC
  Administered 2014-02-21 – 2014-02-23 (×3): 81 mg via ORAL
  Filled 2014-02-20 (×3): qty 1

## 2014-02-20 MED ORDER — POTASSIUM CHLORIDE CRYS ER 20 MEQ PO TBCR
20.0000 meq | EXTENDED_RELEASE_TABLET | Freq: Every day | ORAL | Status: DC
  Administered 2014-02-21 – 2014-02-23 (×3): 20 meq via ORAL
  Filled 2014-02-20 (×3): qty 1

## 2014-02-20 MED ORDER — ONDANSETRON HCL 4 MG PO TABS: 4.0000 mg | ORAL_TABLET | Freq: Four times a day (QID) | ORAL | Status: DC | PRN

## 2014-02-20 MED ORDER — ALBUTEROL SULFATE HFA 108 (90 BASE) MCG/ACT IN AERS: 2.0000 | INHALATION_SPRAY | RESPIRATORY_TRACT | Status: DC | PRN

## 2014-02-20 MED ORDER — TIOTROPIUM BROMIDE MONOHYDRATE 18 MCG IN CAPS
18.0000 ug | ORAL_CAPSULE | Freq: Every day | RESPIRATORY_TRACT | Status: DC
  Administered 2014-02-22 – 2014-02-23 (×2): 18 ug via RESPIRATORY_TRACT
  Filled 2014-02-20 (×2): qty 5

## 2014-02-20 MED ORDER — SIMETHICONE 80 MG PO CHEW
80.0000 mg | CHEWABLE_TABLET | Freq: Four times a day (QID) | ORAL | Status: DC | PRN
  Filled 2014-02-20: qty 1

## 2014-02-20 NOTE — ED Notes (Signed)
Bed: [id number] Expected date:  Expected time:  Means of arrival:  Comments: EMS-weakness

## 2014-02-20 NOTE — ED Provider Notes (Addendum)
CSN: [id number]     Arrival date & time 02/20/14  1521 History   First MD Initiated Contact with Patient 02/20/14 1535     Chief Complaint  Patient presents with  . Shortness of Breath  . Weakness  . Dizziness      HPI  Pt states that at 03:00 he awakened with "rattling shakes and  Chills". He got up and got an extra 2 blankets. Took 650 mg of  Tylenol. He felt like he was stumbling around, dizzy.  States more of a lightheadedness than vertigo. However, he did take meclizine at 05:00.  05:00.  07:00  He got up and took his am regular medicines. Hard to get off of couch 2/2 weakness after that.  Went to [place] and transferred here for further eval and care  Given 500cc ns en route .   On my exam pt is lucid, BP 106/.  100% Sats on RA.  C/O leg pain.  Overall, states that he feels "better than before".    Past Medical History  Diagnosis Date  . Allergy   . Anemia   . Neuropathy   . COPD (chronic obstructive pulmonary disease)   . Hyperthyroidism   . Hyperlipidemia   . Vertigo    Past Surgical History  Procedure Laterality Date  . Cholecystectomy    . 2 heart stents    . Right knee surgery      "scraped it out"   Family History  Problem Relation Age of Onset  . Diabetes Mother   . Heart disease Father   . Diabetes Father   . Heart disease Sister   . Diabetes Sister    History  Substance Use Topics  . Smoking status: Former Smoker -- 1.50 packs/day for 15 years    Types: Cigarettes    Quit date: 04/29/1987  . Smokeless tobacco: Never Used  . Alcohol Use: No    Review of Systems  Constitutional: Positive for fever, chills, diaphoresis and fatigue. Negative for appetite change.  HENT: Negative for mouth sores, sore throat and trouble swallowing.   Eyes: Negative for visual disturbance.  Respiratory: Negative for cough, chest tightness, shortness of breath and wheezing.   Cardiovascular: Negative for chest pain.  Gastrointestinal: Negative for nausea, vomiting,  abdominal pain, diarrhea and abdominal distention.  Endocrine: Negative for polydipsia, polyphagia and polyuria.  Genitourinary: Negative for dysuria, frequency and hematuria.  Musculoskeletal: Negative for gait problem.  Skin: Positive for color change and rash. Negative for pallor.  Neurological: Positive for dizziness, tremors and weakness. Negative for syncope, light-headedness and headaches.  Hematological: Does not bruise/bleed easily.  Psychiatric/Behavioral: Negative for behavioral problems and confusion.      Allergies  Sulfa antibiotics; Aspirin; and Penicillins  Home Medications   Prior to Admission medications   Medication Sig Start Date End Date Taking? Authorizing Provider  acetaminophen (TYLENOL) 325 MG tablet Take 325 mg by mouth every 4 (four) hours as needed for moderate pain. Per bottle as needed for pain   Yes Historical Provider, MD  albuterol (PROVENTIL HFA;VENTOLIN HFA) 108 (90 BASE) MCG/ACT inhaler Inhale 2 puffs into the lungs every 4 (four) hours as needed for wheezing or shortness of breath (((PLAN A))).    Yes Historical Provider, MD  albuterol (PROVENTIL) (5 MG/ML) 0.5% nebulizer solution Take 2.5 mg by nebulization every 4 (four) hours as needed for wheezing or shortness of breath (((PLAN B))).    Yes Historical Provider, MD  aspirin 81 MG tablet Take 81  mg by mouth daily.   Yes Historical Provider, MD  atorvastatin (LIPITOR) 40 MG tablet Take 40 mg by mouth at bedtime.    Yes Historical Provider, MD  budesonide-formoterol (SYMBICORT) 160-4.5 MCG/ACT inhaler Take 2 puffs first thing in am and then another 2 puffs about 12 hours later. 01/03/14  Yes Tanda Rockers, MD  cetirizine (ZYRTEC) 10 MG tablet Take 10 mg by mouth every morning.    Yes Historical Provider, MD  colesevelam (WELCHOL) 625 MG tablet Take 625 mg by mouth daily with breakfast.    Yes Historical Provider, MD  furosemide (LASIX) 20 MG tablet Take 40 mg by mouth daily.    Yes Historical Provider,  MD  gabapentin (NEURONTIN) 100 MG capsule 3 capsules by mouth twice daily   Yes Historical Provider, MD  Guaifenesin (MUCINEX MAXIMUM STRENGTH) 1200 MG TB12 Take 1 tablet by mouth 2 (two) times daily.   Yes Historical Provider, MD  losartan (COZAAR) 50 MG tablet Take 50 mg by mouth daily.   Yes Historical Provider, MD  Magnesium 400 MG CAPS Take 1 capsule by mouth daily.   Yes Historical Provider, MD  meclizine (ANTIVERT) 12.5 MG tablet 2 tabs by mouth every morning   Yes Historical Provider, MD  Multiple Vitamin (MULTIVITAMIN WITH MINERALS) TABS tablet Take 1 tablet by mouth at bedtime.    Yes Historical Provider, MD  nitroGLYCERIN (NITROSTAT) 0.4 MG SL tablet Place 1 tablet (0.4 mg total) under the tongue every 5 (five) minutes as needed for chest pain. 04/26/13  Yes Candee Furbish, MD  OVER THE COUNTER MEDICATION Beclomethasone Cream: apply at bedtime as needed for rash   Yes Historical Provider, MD  pantoprazole (PROTONIX) 40 MG tablet TAKE 1 TABLET BY MOUTH DAILY 30-60 MINUTES PRIOR TO FIRST MEAL OF THE DAY 02/15/14  Yes Tanda Rockers, MD  potassium chloride SA (K-DUR,KLOR-CON) 20 MEQ tablet TAKE 1 TABLET BY MOUTH DAILY 01/16/14  Yes Candee Furbish, MD  Saline (AYR SALINE NASAL DROPS) 0.65 % (SOLN) SOLN Use as needed for nasal congestion   Yes Historical Provider, MD  simethicone (MYLICON) 80 MG chewable tablet Chew 80 mg by mouth once as needed. Per box as needed for gas   Yes Historical Provider, MD  Tiotropium Bromide Monohydrate (SPIRIVA RESPIMAT) 2.5 MCG/ACT AERS Inhale 2 puffs into the lungs every morning. 01/30/14  Yes Tanda Rockers, MD  tiZANidine (ZANAFLEX) 4 MG tablet Take 4 mg by mouth 2 (two) times daily.    Yes Historical Provider, MD   BP 119/45  Pulse 95  Temp(Src) 98 F (36.7 C) (Oral)  Resp 23  Ht 5' 11.5" (1.816 m)  Wt 327 lb (148.326 kg)  BMI 44.98 kg/m2  SpO2 89% Physical Exam  Constitutional: He is oriented to person, place, and time. He appears well-developed and  well-nourished. No distress.  Morbid obesity.  Awake alert and oriented.    HENT:  Head: Normocephalic.  Eyes: Conjunctivae are normal. Pupils are equal, round, and reactive to light. No scleral icterus.  Neck: Normal range of motion. Neck supple. No thyromegaly present.  Cardiovascular: Normal rate and regular rhythm.  Exam reveals no gallop and no friction rub.   No murmur heard. Pulmonary/Chest: Effort normal and breath sounds normal. No respiratory distress. He has no wheezes. He has no rales.  Abdominal: Soft. Bowel sounds are normal. He exhibits no distension. There is no tenderness. There is no rebound.  Musculoskeletal: Normal range of motion.  Neurological: He is alert and oriented to  person, place, and time.  Skin: Skin is warm and dry. No rash noted.     Psychiatric: He has a normal mood and affect. His behavior is normal.    ED Course  Procedures (including critical care time) Labs Review Labs Reviewed  BASIC METABOLIC PANEL - Abnormal; Notable for the following:    Sodium 135 (*)    Chloride 95 (*)    Glucose, Bld 173 (*)    Creatinine, Ser 2.53 (*)    GFR calc non Af Amer 25 (*)    GFR calc Af Amer 28 (*)    All other components within normal limits  CBC WITH DIFFERENTIAL - Abnormal; Notable for the following:    WBC 17.9 (*)    Platelets 101 (*)    Neutrophils Relative % 91 (*)    Neutro Abs 16.2 (*)    Lymphocytes Relative 3 (*)    Lymphs Abs 0.5 (*)    Monocytes Absolute 1.1 (*)    All other components within normal limits  URINALYSIS, ROUTINE W REFLEX MICROSCOPIC - Abnormal; Notable for the following:    Color, Urine ORANGE (*)    APPearance CLOUDY (*)    Bilirubin Urine SMALL (*)    Protein, ur 100 (*)    Leukocytes, UA TRACE (*)    All other components within normal limits  URINE MICROSCOPIC-ADD ON - Abnormal; Notable for the following:    Casts HYALINE CASTS (*)    All other components within normal limits  I-STAT CG4 LACTIC ACID, ED - Abnormal;  Notable for the following:    Lactic Acid, Venous 2.88 (*)    All other components within normal limits  CULTURE, BLOOD (ROUTINE X 2)  CULTURE, BLOOD (ROUTINE X 2)  URINE CULTURE  BASIC METABOLIC PANEL    Imaging Review Dg Chest Port 1 View  02/20/2014   CLINICAL DATA:  Shortness breath and weakness.  EXAM: PORTABLE CHEST - 1 VIEW  COMPARISON:  CT chest 10/20/2013.  Two-view chest x-ray 08/07/2013.  FINDINGS: The heart size is exaggerated by low lung volumes. Mild pulmonary vascular congestion is evident. Bibasilar airspace disease likely reflects atelectasis. Mild prominence of the minor fissure is stable. The visualized soft tissues and bony thorax are unremarkable.  IMPRESSION: 1. Mild pulmonary vascular congestion is new. 2. Low lung volumes with mild bibasilar atelectasis.   Electronically Signed   By: Lawrence Santiago M.D.   On: 02/20/2014 16:59     EKG Interpretation None      MDM   Final diagnoses:  Cellulitis of left lower extremity  Sepsis, due to unspecified organism    Patient with initial episode of hypotension. This is improved with IV fluids. Remains awake alert. Febrile with cellulitis left lower leg. Shakes and chills earlier this morning. Concerned about bacteremia from a cellulitis. Cultures obtained. Given IV vancomycin. Plan will be admission. I have placed a call to the hospitalist.    Tanna Furry, MD 02/20/14 1839  Tanna Furry, MD 02/20/14 [phone]

## 2014-02-20 NOTE — H&P (Addendum)
[facility] History and Physical  Shane Watson ZRA:[id number] DOB: 16-Apr-1946 DOA: 02/20/2014  Referring physician: ED physician PCP: Shirline Frees, MD   Chief Complaint: fevers, chills  HPI:  68 year old male with past medical history of hypertension, morbid obesity, dyslipidemia, CAD, COPD who presented initially to [facility] for evaluation for dizziness and shortness of breath. He was found to have left leg cellulitis and has had a fever of 102.6 F so he was sent to [facility] for further evaluation and treatment. At this time patient feels much better but still feels weak. No significant shortness of breath. No chest pain, palpitations. No abdominal pain, nausea or vomiting. Vitals signs on admission were as follows: BP 81/41, HR 106, RR 26, T max 100.2 F and oxygen saturation of 89% on room air. Blood work showed WBC count of 17.4, platelets of 101, creatinine of 2.53. CXR new mild pulmonary vascular congestion.  Assessment and Plan:  Principal Problem:   Cellulitis of left lower extremity / Sepsis / Leukocytosis  Sepsis criteria met with T 100.2 F (T max 102.6 F), hypotension, HR 106, RR 26 and WBC count 17.4. In addition, there is an evidence of an infection (LLE cellulitis)  Start empiric vancomycin   Follow up blood culture results  Active Problems:   COPD GOLD II with reversibilty /  COPD exacerbation / Acute respiratory failure with hypoxia  Respiratory status stable at this time  Pt is saturating 94% on 2 L [place] oxygen support  Continue symbicort 2 puff BID. spiriva daily and  Albuterol every 4 hours PRN shortness of breath or wheezing   Continue mucinex BID  COPD gold alert order set in place    Pulmonary vascular congestion  Clinically stable  Pt with soft BP and dry   Hold Lasix temporarily and hydrate for 24 hours   CAD (coronary artery disease) of artery bypass graft  Stable, no complaints of chest pain  Continue aspirin and statin therapy     Dyslipidemia  Continue atorvastatin and Wellchol   Hypertension  Antihypertensives on hold due to soft BP   Chronic kidney disease, stage IV  Creatinine in 2014 was 1.5. On this admission, creatinine was 2.53. Possibly due to acute infection and sepsis  Continue IV fluids and temporarily hold losartan and lasix   Follow up renal function in am    DVT prophylaxis  SCD' sbialterally while in hospital due to mild thrombocytopenia   Radiological Exams on Admission: Dg Chest Port 1 View 02/20/2014  IMPRESSION: 1. Mild pulmonary vascular congestion is new. 2. Low lung volumes with mild bibasilar atelectasis.     Code Status: Full Family Communication: Pt at bedside Disposition Plan: Admit for further evaluation     Review of Systems:  Constitutional: positive for fever, chills no malaise/fatigue. Negative for diaphoresis.  HENT: Negative for hearing loss, ear pain, nosebleeds, congestion, sore throat, neck pain, tinnitus and ear discharge.   Eyes: Negative for blurred vision, double vision, photophobia, pain, discharge and redness.  Respiratory: Negative for cough, hemoptysis, sputum production, wheezing and stridor.   Cardiovascular: Negative for chest pain, palpitations, orthopnea, claudication and leg swelling.  Gastrointestinal: Negative for nausea, vomiting and abdominal pain.  Genitourinary: Negative for dysuria, urgency, frequency, hematuria and flank pain.  Musculoskeletal: Negative for myalgias, back pain, joint pain and falls.  Skin: left leg redness; skin otherwise normal. Neurological: positive for weakness.  Endo/Heme/Allergies: Negative for environmental allergies and polydipsia. Does not bruise/bleed easily.  Psychiatric/Behavioral: Negative for suicidal  ideas. The patient is not nervous/anxious.      Past Medical History  Diagnosis Date  . Allergy   . Anemia   . Neuropathy   . COPD (chronic obstructive pulmonary disease)   . Hyperthyroidism   . Hyperlipidemia    . Vertigo     Past Surgical History  Procedure Laterality Date  . Cholecystectomy    . 2 heart stents    . Right knee surgery      "scraped it out"    Social History:  reports that he quit smoking about 26 years ago. His smoking use included Cigarettes. He has a 22.5 pack-year smoking history. He has never used smokeless tobacco. He reports that he does not drink alcohol or use illicit drugs.  Allergies  Allergen Reactions  . Sulfa Antibiotics Hives  . Aspirin Swelling  . Penicillins Hives    Family History  Problem Relation Age of Onset  . Diabetes Mother   . Heart disease Father   . Diabetes Father   . Heart disease Sister   . Diabetes Sister     Prior to Admission medications   Medication Sig Start Date End Date Taking? Authorizing Provider  acetaminophen (TYLENOL) 325 MG tablet Take 325 mg by mouth every 4 (four) hours as needed for moderate pain. Per bottle as needed for pain   Yes Historical Provider, MD  albuterol (PROVENTIL HFA;VENTOLIN HFA) 108 (90 BASE) MCG/ACT inhaler Inhale 2 puffs into the lungs every 4 (four) hours as needed for wheezing or shortness of breath (((PLAN A))).    Yes Historical Provider, MD  albuterol (PROVENTIL) (5 MG/ML) 0.5% nebulizer solution Take 2.5 mg by nebulization every 4 (four) hours as needed for wheezing or shortness of breath (((PLAN B))).    Yes Historical Provider, MD  aspirin 81 MG tablet Take 81 mg by mouth daily.   Yes Historical Provider, MD  atorvastatin (LIPITOR) 40 MG tablet Take 40 mg by mouth at bedtime.    Yes Historical Provider, MD  budesonide-formoterol (SYMBICORT) 160-4.5 MCG/ACT inhaler Take 2 puffs first thing in am and then another 2 puffs about 12 hours later. 01/03/14  Yes Tanda Rockers, MD  cetirizine (ZYRTEC) 10 MG tablet Take 10 mg by mouth every morning.    Yes Historical Provider, MD  colesevelam (WELCHOL) 625 MG tablet Take 625 mg by mouth daily with breakfast.    Yes Historical Provider, MD  furosemide  (LASIX) 20 MG tablet Take 40 mg by mouth daily.    Yes Historical Provider, MD  gabapentin (NEURONTIN) 100 MG capsule 3 capsules by mouth twice daily   Yes Historical Provider, MD  Guaifenesin (MUCINEX MAXIMUM STRENGTH) 1200 MG TB12 Take 1 tablet by mouth 2 (two) times daily.   Yes Historical Provider, MD  losartan (COZAAR) 50 MG tablet Take 50 mg by mouth daily.   Yes Historical Provider, MD  Magnesium 400 MG CAPS Take 1 capsule by mouth daily.   Yes Historical Provider, MD  meclizine (ANTIVERT) 12.5 MG tablet 2 tabs by mouth every morning   Yes Historical Provider, MD  Multiple Vitamin (MULTIVITAMIN WITH MINERALS) TABS tablet Take 1 tablet by mouth at bedtime.    Yes Historical Provider, MD  nitroGLYCERIN (NITROSTAT) 0.4 MG SL tablet Place 1 tablet (0.4 mg total) under the tongue every 5 (five) minutes as needed for chest pain. 04/26/13  Yes Candee Furbish, MD  OVER THE COUNTER MEDICATION Beclomethasone Cream: apply at bedtime as needed for rash   Yes Historical Provider,  MD  pantoprazole (PROTONIX) 40 MG tablet TAKE 1 TABLET BY MOUTH DAILY 30-60 MINUTES PRIOR TO FIRST MEAL OF THE DAY 02/15/14  Yes Tanda Rockers, MD  potassium chloride SA (K-DUR,KLOR-CON) 20 MEQ tablet TAKE 1 TABLET BY MOUTH DAILY 01/16/14  Yes Candee Furbish, MD  Saline (AYR SALINE NASAL DROPS) 0.65 % (SOLN) SOLN Use as needed for nasal congestion   Yes Historical Provider, MD  simethicone (MYLICON) 80 MG chewable tablet Chew 80 mg by mouth once as needed. Per box as needed for gas   Yes Historical Provider, MD  Tiotropium Bromide Monohydrate (SPIRIVA RESPIMAT) 2.5 MCG/ACT AERS Inhale 2 puffs into the lungs every morning. 01/30/14  Yes Tanda Rockers, MD  tiZANidine (ZANAFLEX) 4 MG tablet Take 4 mg by mouth 2 (two) times daily.    Yes Historical Provider, MD    Physical Exam: Filed Vitals:   02/20/14 1717 02/20/14 1730 02/20/14 1900 02/20/14 1930  BP: 103/46 95/40 119/45 119/54  Pulse: 89 95 95 94  Temp:      TempSrc:      Resp: 24  23    Height:      Weight:      SpO2: 98% 96% 89% 91%    Physical Exam  Constitutional: Appears well-developed and well-nourished. No distress.  HENT: Normocephalic. External right and left ear normal. Oropharynx is clear and moist.  Eyes: Conjunctivae and EOM are normal. PERRLA, no scleral icterus.  Neck: Normal ROM. Neck supple. No JVD. No tracheal deviation. No thyromegaly.  CVS: RRR, S1/S2 +, no murmurs, no gallops, no carotid bruit.  Pulmonary: Effort and breath sounds normal, no stridor, rhonchi, diminished breath sounds at bases  Abdominal: Soft. BS +,  no distension, tenderness, rebound or guarding.  Musculoskeletal: Normal range of motion. LLE edema and erythema, warmth to touch Lymphadenopathy: No lymphadenopathy noted, cervical, inguinal. Neuro: Alert. Normal reflexes, muscle tone coordination. No cranial nerve deficit. Skin: left lower extremity redness and TTP, warmth; pulses palpable bilaterally .  Psychiatric: Normal mood and affect. Behavior, judgment, thought content normal.   Labs on Admission:  Basic Metabolic Panel:  Recent Labs Lab 02/20/14 1642  NA 135*  K 5.0  CL 95*  CO2 29  GLUCOSE 173*  BUN 22  CREATININE 2.53*  CALCIUM 8.9   CBC:  Recent Labs Lab 02/20/14 1436 02/20/14 1642  WBC 17.4* 17.9*  NEUTROABS  --  16.2*  HGB 14.0* 14.1  HCT 44.5 43.9  MCV 87.9 89.4  PLT  --  101*    MAGICK-MYERS, Rebecca Eaton, MD  [facility] Pager [phone]  If 7PM-7AM, please contact night-coverage [email or web address] Password [email or web address] 02/20/2014, 8:15 PM

## 2014-02-20 NOTE — Progress Notes (Addendum)
ANTIBIOTIC CONSULT NOTE - INITIAL  Pharmacy Consult for Vancomycin Indication: Cellulitis  Allergies  Allergen Reactions  . Sulfa Antibiotics Hives  . Aspirin Swelling  . Penicillins Hives    Patient Measurements:   Adjusted Body Weight:   Vital Signs: Temp: 98 F (36.7 C) (08/04 1543) Temp src: Oral (08/04 1543) BP: 92/33 mmHg (08/04 1540) Pulse Rate: 93 (08/04 1540) Intake/Output from previous day:   Intake/Output from this shift:    Labs:  Recent Labs  02/20/14 1436  WBC 17.4*  HGB 14.0*   The CrCl is unknown because both a height and weight (above a minimum accepted value) are required for this calculation. No results found for this basename: VANCOTROUGH, VANCOPEAK, VANCORANDOM, GENTTROUGH, GENTPEAK, GENTRANDOM, TOBRATROUGH, TOBRAPEAK, TOBRARND, AMIKACINPEAK, AMIKACINTROU, AMIKACIN,  in the last 72 hours   Microbiology: No results found for this or any previous visit (from the past 720 hour(s)).  Medical History: Past Medical History  Diagnosis Date  . Allergy   . Anemia   . Neuropathy     Assessment: 44 yoM presented to urgent care with dizziness and SOB, transported to [facility] for symptoms along with cellulitis of LLE, fever, and leukocytosis.  Pharmacy consulted to start Vancomycin for cellulitis. Noted allergies to penicillins and sulfa. PMHx: COPD, CAD, morbid obesity, hx lower extremity cellulitis  8/4 >> Vancomycin >>  Tmax: 98.8 WBCs: 17.4K Renal: BMET ordered, not collected yet  8/4 blood: ordered 8/4 urine: ordered  Goal of Therapy:  Vancomycin trough level 10-15 mcg/ml  Plan:  Vancomycin 2500mg  IV x 1, will follow-up updated weight and renal function for further doses   Sheleen Conchas E 02/20/2014,4:12 PM  ADDENDUM: BMET processed, SCr 2.5, normalized CrCl 28 (42 CG).  Will use obese nomogram for dosing.  Start maintenance dose Vancomycin 2g IV q 48h (next dose 8/6).  F/u renal fxn and adjust accordingly.    Ralene Bathe, PharmD,  BCPS 02/20/2014, 5:29 PM  Pager: [phone]

## 2014-02-20 NOTE — Progress Notes (Signed)
   Subjective:    Patient ID: Shane Watson, male    DOB: Oct 10, 1945, 68 y.o.   MRN: [id number]  HPI Emergency.  Dizzy, sob, unstable gait. Not sure whats wrong.Has copd, cad, morbid obesity and hx of cellulitis of legs.   Review of Systems     Objective:   Physical Exam  Constitutional: He is oriented to person, place, and time. He appears well-nourished. He appears distressed.  HENT:  Head: Normocephalic.  Right Ear: External ear normal.  Left Ear: External ear normal.  Nose: Nose normal.  Mouth/Throat: Oropharynx is clear and moist.  Eyes: EOM are normal. Pupils are equal, round, and reactive to light.  Neck: Normal range of motion. Neck supple.  Cardiovascular: Regular rhythm and normal heart sounds.  Tachycardia present.  Exam reveals no gallop.   Pulmonary/Chest: He is in respiratory distress. He has no wheezes. He exhibits no tenderness.  Abdominal: Soft. There is no tenderness.  Musculoskeletal: He exhibits edema and tenderness.  Neurological: He is alert and oriented to person, place, and time. No cranial nerve deficit. He exhibits normal muscle tone. Coordination abnormal.  Skin: Skin is intact. Rash noted. Rash is not vesicular. There is erythema.     Painful, swollen erythema left lower leg  Psychiatric: He has a normal mood and affect.   Results for orders placed in visit on 02/20/14  POCT CBC      Result Value Ref Range   WBC 17.4 (*) 4.6 - 10.2 K/uL   Lymph, poc 0.6  0.6 - 3.4   POC LYMPH PERCENT 3.7 (*) 10 - 50 %L   MID (cbc) 0.4  0 - 0.9   POC MID % 2.2  0 - 12 %M   POC Granulocyte 16.4 (*) 2 - 6.9   Granulocyte percent 94.1 (*) 37 - 80 %G   RBC 5.06  4.69 - 6.13 M/uL   Hemoglobin 14.0 (*) 14.1 - 18.1 g/dL   HCT, POC 44.5  43.5 - 53.7 %   MCV 87.9  80 - 97 fL   MCH, POC 27.7  27 - 31.2 pg   MCHC 31.5 (*) 31.8 - 35.4 g/dL   RDW, POC 16.6     Platelet Count, POC 138 (*) 142 - 424 K/uL   MPV 5.7  0 - 99.8 fL   Unable to collect urine  Allergic to  penicillin cannot give rocephin.       Assessment & Plan:  Cellulitis leg/probable bacteremia/LBP Dizzy/SOB/COPD  EMTs to [facility]  Admit with celulitis/Bcteremia

## 2014-02-20 NOTE — ED Notes (Signed)
Per EMS- Patient was at [facility] today with SOB, weakness, dizziness, and cellulitis of the left lower extremity. UC reported a fever -102.6. Patient given 500 ml NS.

## 2014-02-20 NOTE — ED Notes (Signed)
Pt ambulatory to restroom. Unsteady gait and states he is dizzy

## 2014-02-20 NOTE — ED Notes (Signed)
Aware that urine sample is needed. Unable at this time

## 2014-02-20 NOTE — Patient Instructions (Signed)
Bacteremia Bacteremia occurs when bacteria get in your blood. Normal blood does not usually have bacteria. Bacteremia is one way infections can spread from one part of the body to another. CAUSES   Causes may include anything that allows bacteria to get into the body. Examples are:  Catheters.  Intravenous (IV) access tubes.  Cuts or scrapes of the skin.  Temporary bacteremia may occur during dental procedures, while brushing your teeth, or during a bowel movement. This rarely causes any symptoms or medical problems.  Bacteria may also get in the bloodstream as a complication of a bacterial infection elsewhere. This includes infected wounds and bacterial infections of the:  Lungs (pneumonia).  Kidneys (pyelonephritis).  Intestines (enteritis, colitis).  Organs in the abdomen (appendicitis, cholecystitis, diverticulitis). SYMPTOMS  The body is usually able to clear small numbers of bacteria out of the blood quickly. Brief bacteremia usually does not cause problems.   Problems can occur if the bacteria start to grow in number or spread to other parts of the body. If the bacteria start growing, you may develop:  Chills.  Fever.  Nausea.  Vomiting.  Sweating.  Lightheadedness and low blood pressure.  Pain.  If bacteria start to grow in the linings around the brain, it is called meningitis. This can cause severe headaches, many other problems, and even death.  If bacteria start to grow in a joint, it causes arthritis with painful joints. If bacteria start to grow in a bone, it is called osteomyelitis.  Bacteria from the blood can also cause sores (abscesses) in many organs, such as the muscle, liver, spleen, lungs, brain, and kidneys. DIAGNOSIS   This condition is diagnosed by cultures of the blood.  Cultures may also be taken from other parts of the body that are thought to be causing the bacteremia. A small piece of tissue, fluid, or other product of the body is  sampled. The sample is then put on a growth plate to see if any bacteria grows.  Other lab tests may be done and the results may be abnormal. TREATMENT  Treatment requires a stay in the hospital. You will be given antibiotic medicine through an IV access tube. PREVENTION  People with an increased risk of developing bacteremia or complications may be given antibiotics before certain procedures. Examples are:  A person with a heart murmur or artificial heart valve, before having his or her teeth cleaned.  Before having a surgical or other invasive procedure.  Before having a bowel procedure. Document Released: 04/19/2006 Document Revised: 09/28/2011 Document Reviewed: 01/29/2011 [facility] Patient Information 2015 [place], [place]. This information is not intended to replace advice given to you by your health care provider. Make sure you discuss any questions you have with your health care provider. Cellulitis Cellulitis is an infection of the skin and the tissue beneath it. The infected area is usually red and tender. Cellulitis occurs most often in the arms and lower legs.  CAUSES  Cellulitis is caused by bacteria that enter the skin through cracks or cuts in the skin. The most common types of bacteria that cause cellulitis are staphylococci and streptococci. SIGNS AND SYMPTOMS   Redness and warmth.  Swelling.  Tenderness or pain.  Fever. DIAGNOSIS  Your health care provider can usually determine what is wrong based on a physical exam. Blood tests may also be done. TREATMENT  Treatment usually involves taking an antibiotic medicine. HOME CARE INSTRUCTIONS   Take your antibiotic medicine as directed by your health care provider. [employer]  the antibiotic even if you start to feel better.  Keep the infected arm or leg elevated to reduce swelling.  Apply a warm cloth to the affected area up to 4 times per day to relieve pain.  Take medicines only as directed by your health care  provider.  Keep all follow-up visits as directed by your health care provider. SEEK MEDICAL CARE IF:   You notice red streaks coming from the infected area.  Your red area gets larger or turns dark in color.  Your bone or joint underneath the infected area becomes painful after the skin has healed.  Your infection returns in the same area or another area.  You notice a swollen bump in the infected area.  You develop new symptoms.  You have a fever. SEEK IMMEDIATE MEDICAL CARE IF:   You feel very sleepy.  You develop vomiting or diarrhea.  You have a general ill feeling (malaise) with muscle aches and pains. MAKE SURE YOU:   Understand these instructions.  Will watch your condition.  Will get help right away if you are not doing well or get worse. Document Released: 04/15/2005 Document Revised: 11/20/2013 Document Reviewed: 09/21/2011 [facility] Patient Information 2015 [place], [place]. This information is not intended to replace advice given to you by your health care provider. Make sure you discuss any questions you have with your health care provider.

## 2014-02-21 LAB — HEMOGLOBIN A1C
HEMOGLOBIN A1C: 6.7 % — AB (ref <5.7)
Mean Plasma Glucose: 146 mg/dL — ABNORMAL HIGH (ref <117)

## 2014-02-21 LAB — CBC
HCT: 39.5 % (ref 39.0–52.0)
Hemoglobin: 12.6 g/dL — ABNORMAL LOW (ref 13.0–17.0)
MCH: 28 pg (ref 26.0–34.0)
MCHC: 31.9 g/dL (ref 30.0–36.0)
MCV: 87.8 fL (ref 78.0–100.0)
PLATELETS: 101 10*3/uL — AB (ref 150–400)
RBC: 4.5 MIL/uL (ref 4.22–5.81)
RDW: 15.4 % (ref 11.5–15.5)
WBC: 13.9 10*3/uL — AB (ref 4.0–10.5)

## 2014-02-21 LAB — URINE CULTURE
Colony Count: NO GROWTH
Culture: NO GROWTH

## 2014-02-21 LAB — BASIC METABOLIC PANEL
Anion gap: 10 (ref 5–15)
BUN: 31 mg/dL — ABNORMAL HIGH (ref 6–23)
CO2: 27 mEq/L (ref 19–32)
Calcium: 8.4 mg/dL (ref 8.4–10.5)
Chloride: 98 mEq/L (ref 96–112)
Creatinine, Ser: 3.05 mg/dL — ABNORMAL HIGH (ref 0.50–1.35)
GFR, EST AFRICAN AMERICAN: 23 mL/min — AB (ref >90)
GFR, EST NON AFRICAN AMERICAN: 20 mL/min — AB (ref >90)
Glucose, Bld: 153 mg/dL — ABNORMAL HIGH (ref 70–99)
Potassium: 4.7 mEq/L (ref 3.7–5.3)
SODIUM: 135 meq/L — AB (ref 137–147)

## 2014-02-21 MED ORDER — SODIUM CHLORIDE 0.9 % IV BOLUS (SEPSIS)
250.0000 mL | Freq: Once | INTRAVENOUS | Status: AC
  Administered 2014-02-21: 250 mL via INTRAVENOUS

## 2014-02-21 NOTE — Progress Notes (Signed)
[facility] PROGRESS NOTE  Shane Watson [id number] DOB: 1946-01-06 DOA: 02/20/2014 PCP: Shirline Frees, MD  Assessment/Plan:  Principal Problem:   Cellulitis of left lower extremity - Continue current antibiotic regimen - monitor cbc's  Active Problems:     CAD (coronary artery disease) of artery bypass graft - aspirin and lipitor on board - no chest pain reported to me by patient and family    COPD - compensated currently - continue current regimen    Dyslipidemia - stable, pt on lipitor   CKD (chronic kidney disease) stage 4, GFR 15-29 ml/min - continue to monitor serum creatinine levels   Code Status: full Family Communication: none at bedside Disposition Plan:    Consultants:  None  Antibiotics:  Vancomycin  HPI/Subjective: No new complaints reported  Objective: Filed Vitals:   02/21/14 1344  BP: 128/52  Pulse: 82  Temp: 98.2 F (36.8 C)  Resp: 20    Intake/Output Summary (Last 24 hours) at 02/21/14 1809 Last data filed at 02/21/14 1700  Gross per 24 hour  Intake 1468.33 ml  Output   1050 ml  Net 418.33 ml   Filed Weights   02/20/14 1613 02/20/14 2016 02/21/14 0520  Weight: 148.326 kg (327 lb) 159.031 kg (350 lb 9.6 oz) 159.848 kg (352 lb 6.4 oz)    Exam:   General:  Pt in nad  Cardiovascular: rrr, no mrg  Respiratory: no wheezes, [place] in place, no increased wob  Abdomen: obese, soft, Nd  Musculoskeletal: no cyanosis   Data Reviewed: Basic Metabolic Panel:  Recent Labs Lab 02/20/14 1642 02/21/14 0422  NA 135* 135*  K 5.0 4.7  CL 95* 98  CO2 29 27  GLUCOSE 173* 153*  BUN 22 31*  CREATININE 2.53* 3.05*  CALCIUM 8.9 8.4   Liver Function Tests: No results found for this basename: AST, ALT, ALKPHOS, BILITOT, PROT, ALBUMIN,  in the last 168 hours No results found for this basename: LIPASE, AMYLASE,  in the last 168 hours No results found for this basename: AMMONIA,  in the last 168 hours CBC:  Recent  Labs Lab 02/20/14 1436 02/20/14 1642 02/21/14 0422  WBC 17.4* 17.9* 13.9*  NEUTROABS  --  16.2*  --   HGB 14.0* 14.1 12.6*  HCT 44.5 43.9 39.5  MCV 87.9 89.4 87.8  PLT  --  101* 101*   Cardiac Enzymes: No results found for this basename: CKTOTAL, CKMB, CKMBINDEX, TROPONINI,  in the last 168 hours BNP (last 3 results) No results found for this basename: PROBNP,  in the last 8760 hours CBG: No results found for this basename: GLUCAP,  in the last 168 hours  Recent Results (from the past 240 hour(s))  CULTURE, BLOOD (ROUTINE X 2)     Status: None   Collection Time    02/20/14  4:38 PM      Result Value Ref Range Status   Specimen Description BLOOD LEFT ANTECUBITAL   Final   Special Requests BOTTLES DRAWN AEROBIC AND ANAEROBIC 5 CC    Final   Culture  Setup Time     Final   Value: 02/20/2014 18:51     Performed at [facility]   Culture     Final   Value:        BLOOD CULTURE RECEIVED NO GROWTH TO DATE CULTURE WILL BE HELD FOR 5 DAYS BEFORE ISSUING A FINAL NEGATIVE REPORT     Performed at [facility]   Report Status PENDING  Incomplete  CULTURE, BLOOD (ROUTINE X 2)     Status: None   Collection Time    02/20/14  6:30 PM      Result Value Ref Range Status   Specimen Description BLOOD LEFT HAND   Final   Special Requests BOTTLES DRAWN AEROBIC ONLY 6CC   Final   Culture  Setup Time     Final   Value: 02/20/2014 22:21     Performed at [facility]   Culture     Final   Value:        BLOOD CULTURE RECEIVED NO GROWTH TO DATE CULTURE WILL BE HELD FOR 5 DAYS BEFORE ISSUING A FINAL NEGATIVE REPORT     Performed at [facility]   Report Status PENDING   Incomplete     Studies: Dg Chest Port 1 View  02/20/2014   CLINICAL DATA:  Shortness breath and weakness.  EXAM: PORTABLE CHEST - 1 VIEW  COMPARISON:  CT chest 10/20/2013.  Two-view chest x-ray 08/07/2013.  FINDINGS: The heart size is exaggerated by low lung volumes. Mild pulmonary vascular  congestion is evident. Bibasilar airspace disease likely reflects atelectasis. Mild prominence of the minor fissure is stable. The visualized soft tissues and bony thorax are unremarkable.  IMPRESSION: 1. Mild pulmonary vascular congestion is new. 2. Low lung volumes with mild bibasilar atelectasis.   Electronically Signed   By: Lawrence Santiago M.D.   On: 02/20/2014 16:59    Scheduled Meds: . aspirin  81 mg Oral Daily  . atorvastatin  40 mg Oral QHS  . budesonide-formoterol  2 puff Inhalation BID  . colesevelam  625 mg Oral Q breakfast  . gabapentin  300 mg Oral BID  . guaiFENesin  1,200 mg Oral BID  . loratadine  10 mg Oral Daily  . magnesium oxide  400 mg Oral Daily  . pantoprazole  40 mg Oral Daily  . potassium chloride SA  20 mEq Oral Daily  . sodium chloride  3 mL Intravenous Q12H  . tiotropium  18 mcg Inhalation Daily  . tiZANidine  4 mg Oral BID  . [START ON 02/22/2014] vancomycin  2,000 mg Intravenous Q48H   Continuous Infusions:    Time spent: > 35 minutes    Velvet Bathe  [facility] Pager [phone] If 7PM-7AM, please contact night-coverage at [email or web address], password [facility] 02/21/2014, 6:09 PM  LOS: 1 day

## 2014-02-21 NOTE — Progress Notes (Signed)
INITIAL NUTRITION ASSESSMENT  DOCUMENTATION CODES Per approved criteria  -Morbid Obesity   INTERVENTION: -Provided pt with heart healthy and weight management education and handouts from the Academy of Nutrition and Dietetics -Consider modifying to Heart Healthy diet to assist with compliance -Will continue to monitor   NUTRITION DIAGNOSIS: Food and Knowledge Related Deficits related to heart healthy diet recommendation/weight management as evidenced by diet recall, BMI > 40.0, hx of CAD/HTN/obesity.   Goal: Pt to meet >/= 90% of their estimated nutrition needs    Monitor:  Diet education needs, skin integrity, total protein/energy intake  Reason for Assessment: Consult to Assess  69 y.o. male  Admitting Dx: Cellulitis of left lower extremity  ASSESSMENT: 68 year old male with past medical history of hypertension, morbid obesity, dyslipidemia, CAD, COPD who presented initially to [facility] for evaluation for dizziness and shortness of breath. He was found to have left leg cellulitis and has had a fever of 102.6 F so he was sent to [facility] for further evaluation and treatment. At this time patient feels much better but still feels weak  -Pt denied changes in appetite pta. Diet recall indicates pt consumes pop tarts or peanut butter crackers for breakfast, and consumes lunch and dinner at restaurants. Has friend that also brings him meals. Does not cook at home -Had lost 6-8 lbs recently; however gained it back. Pt unsure if weight gain was fluid related. Usual body weight around 336-340 lbs -Encouraged pt to incorporate more heart healthy meals at home. Discussed possible heart healthy lunches to incorporate and reviewed possible fruit/vegetable snack ideas to assist in weight management -Provided pt with "Heart Healthy Nutrition Therapy", "Heart healthy Shopping Tips", and "Weight Loss Tips" handouts from the Academy of Nutrition and Dietetics. Reviewed and summarized info with patient,  who verbalized understanding. Was in agreement to set weekly goal to promote compliance -Currently on regular diet. Pt with good appetite, consuming > 75% of meals. Consider modifying to Heart Healthy diet to promote diet recommendations -Hx of CKD4, phos/K WNL.    Height: Ht Readings from Last 1 Encounters:  02/20/14 5' 11.5" (1.816 m)    Weight: Wt Readings from Last 1 Encounters:  02/21/14 352 lb 6.4 oz (159.848 kg)    Ideal Body Weight: 178 lbs  % Ideal Body Weight: 198%  Wt Readings from Last 10 Encounters:  02/21/14 352 lb 6.4 oz (159.848 kg)  01/15/14 345 lb (156.491 kg)  10/17/13 351 lb (159.213 kg)  10/11/13 350 lb (158.759 kg)  08/25/13 350 lb 12.8 oz (159.122 kg)  08/10/13 352 lb (159.666 kg)  06/06/13 349 lb (158.305 kg)  05/02/13 342 lb (155.13 kg)  04/28/13 344 lb 9.6 oz (156.31 kg)    Usual Body Weight: 336-340 lbs  % Usual Body Weight: 104%  BMI:  Body mass index is 48.47 kg/(m^2). Obesity III/Morbid obesity  Estimated Nutritional Needs: Kcal:2000-2200 Protein: 120-135 gram Fluid: per MD  Skin: left leg cellulitis, +1 RLE edema, +2 LLE edema  Diet Order: General  EDUCATION NEEDS: -Education needs addressed   Intake/Output Summary (Last 24 hours) at 02/21/14 1041 Last data filed at 02/21/14 0901  Gross per 24 hour  Intake 748.33 ml  Output    350 ml  Net 398.33 ml    Last BM: 8/03   Labs:   Recent Labs Lab 02/20/14 1642 02/21/14 0422  NA 135* 135*  K 5.0 4.7  CL 95* 98  CO2 29 27  BUN 22 31*  CREATININE 2.53* 3.05*  CALCIUM 8.9 8.4  GLUCOSE 173* 153*    CBG (last 3)  No results found for this basename: GLUCAP,  in the last 72 hours  Scheduled Meds: . aspirin  81 mg Oral Daily  . atorvastatin  40 mg Oral QHS  . budesonide-formoterol  2 puff Inhalation BID  . colesevelam  625 mg Oral Q breakfast  . gabapentin  300 mg Oral BID  . guaiFENesin  1,200 mg Oral BID  . loratadine  10 mg Oral Daily  . magnesium oxide  400 mg  Oral Daily  . pantoprazole  40 mg Oral Daily  . potassium chloride SA  20 mEq Oral Daily  . sodium chloride  3 mL Intravenous Q12H  . tiotropium  18 mcg Inhalation Daily  . tiZANidine  4 mg Oral BID  . [START ON 02/22/2014] vancomycin  2,000 mg Intravenous Q48H    Continuous Infusions:   Past Medical History  Diagnosis Date  . Allergy   . Anemia   . Neuropathy   . COPD (chronic obstructive pulmonary disease)   . Hyperthyroidism   . Hyperlipidemia   . Vertigo     Past Surgical History  Procedure Laterality Date  . Cholecystectomy    . 2 heart stents    . Right knee surgery      "scraped it out"    [place] LDN Clinical Dietitian [id number]

## 2014-02-21 NOTE — Progress Notes (Signed)
Noted BP 90/47. Patient still c/o feeling dizzy when getting OOB. On call NP paged and made aware. New order received: NS 250 ml bolus.   02/21/14 0520  Vitals  Temp 98.1 F (36.7 C)  Temp src Oral  BP ! 90/47 mmHg  BP Location Left arm  BP Method Automatic  Patient Position (if appropriate) Lying  Pulse Rate 78  Pulse Rate Source Dinamap  Resp 20  Oxygen Therapy  SpO2 94 %  O2 Device Nasal cannula  O2 Flow Rate (L/min) 2 L/min

## 2014-02-21 NOTE — Evaluation (Addendum)
Physical Therapy Evaluation Patient Details Name: Shane Watson MRN: [id number] DOB: 10-19-1945 Today's Date: 02/21/2014   History of Present Illness  This 68 y.o. male admitted with sepsis due to cellulitis Lt. LE;  Pt with PMH that includes COPD; pulmonary vascular congestion; CAD; s/p CABG; CKD; HTN; vertigo  Clinical Impression  Pt will benefit from PT to address deficits below; would benefit from RW but can not use it in his apt. Will follow this venue    Follow Up Recommendations Home health PT    Equipment Recommendations   (may benefit from RW but pt may decline d/t unable to use one in his apt)    Recommendations for Other Services       Precautions / Restrictions Precautions Precautions: Fall      Mobility  Bed Mobility                  Transfers Overall transfer level: Needs assistance Equipment used: Rolling walker (2 wheeled) Transfers: Sit to/from Stand Sit to Stand: Min guard Stand pivot transfers: Min guard       General transfer comment: cues for safety and hand placement  Ambulation/Gait Ambulation/Gait assistance: Min guard;Min assist;+2 safety/equipment Ambulation Distance (Feet): 200 Feet Assistive device: Rolling walker (2 wheeled) Gait Pattern/deviations: Step-through pattern   Gait velocity interpretation: at or above normal speed for age/gender General Gait Details: cues occaionally for RW safety, one standing rest d/t DOE  Stairs            Wheelchair Mobility    Modified Rankin (Stroke Patients Only)       Balance Overall balance assessment: Needs assistance Sitting-balance support: Feet supported Sitting balance-Leahy Scale: Good     Standing balance support: During functional activity Standing balance-Leahy Scale: Fair                               Pertinent Vitals/Pain Sats >93% but incr DOE, O2 provided at L and pt maintained sats at >96% HR 100-106 No c/o pain other than LLE "soreness"     Home Living Family/patient expects to be discharged to:: Private residence Living Arrangements: Alone   Type of Home: [place]: Grab bars - toilet;Grab bars - tub/shower;Other (comment)      Prior Function Level of Independence: Independent;Independent with assistive device(s)         Comments: Pt reports he does not cook, he either goes out, or he goes to a friend's. He reports he drives and wears 02 intermittently - he states he wears it when he walks      Hand Dominance   Dominant Hand: Right    Extremity/Trunk Assessment   Upper Extremity Assessment: Defer to OT evaluation           Lower Extremity Assessment: Overall WFL for tasks assessed;LLE deficits/detail   LLE Deficits / Details: edematous and sore, AROM WFL, strengthe grossly WFL for activities tested  Cervical / Trunk Assessment: Normal  Communication   Communication: No difficulties  Cognition Arousal/Alertness: Awake/alert Behavior During Therapy: WFL for tasks assessed/performed Overall Cognitive Status: Within Functional Limits for tasks assessed                      General Comments      Exercises        Assessment/Plan    PT Assessment Patient needs continued PT services  PT  Diagnosis Difficulty walking   PT Problem List Decreased activity tolerance;Decreased balance;Decreased mobility;Decreased knowledge of use of DME  PT Treatment Interventions DME instruction;Gait training;Functional mobility training;Patient/family education;Therapeutic activities;Therapeutic exercise   PT Goals (Current goals can be found in the Care Plan section) Acute Rehab PT Goals Patient Stated Goal: to get better PT Goal Formulation: With patient Time For Goal Achievement: 02/28/14 Potential to Achieve Goals: Good    Frequency Min 3X/week   Barriers to discharge        Co-evaluation               End of Session Equipment Utilized During Treatment: Gait  belt;Oxygen Activity Tolerance: Patient tolerated treatment well Patient left: in chair;with call bell/phone within reach;with chair alarm set Nurse Communication: Mobility status         Time: [id number] PT Time Calculation (min): 27 min   Charges:   PT Evaluation $Initial PT Evaluation Tier I: 1 Procedure PT Treatments $Gait Training: 23-37 mins   PT G Codes:          Alphonse Asbridge March 06, 2014, 1:34 PM

## 2014-02-21 NOTE — Progress Notes (Signed)
Placed pt. On cpap. As per order. Pt. Is tolerating well at this time.

## 2014-02-21 NOTE — Care Management Note (Addendum)
    Page 1 of 1   02/22/2014     4:38:05 PM CARE MANAGEMENT NOTE 02/22/2014  Patient:  Shane Watson, Shane Watson   Account Number:  [id number]  Date Initiated:  02/21/2014  Documentation initiated by:  [facility]  Subjective/Objective Assessment:   68 Y/O M ADMITTED W/L LWR EXTREMITY CELLULITIS.[id number].     Action/Plan:   FROM HOME ALONE.   Anticipated DC Date:  02/23/2014   Anticipated DC Plan:  [place]  CM consult      Choice offered to / List presented to:  C-1 Patient           Status of service:  In process, will continue to follow Medicare Important Message given?   (If response is "NO", the following Medicare IM given date fields will be blank) Date Medicare IM given:   Medicare IM given by:   Date Additional Medicare IM given:   Additional Medicare IM given by:    Discharge Disposition:    Per UR Regulation:  Reviewed for med. necessity/level of care/duration of stay  If discussed at [place] of Stay Meetings, dates discussed:    Comments:  02/22/14 Lamonica Trueba RN,BSN NCM [id number] SPOKE TO PATIENT AT LENGTH ABOUT D/C PLANS-HE IS UNSURE IF HE NEEDS HHC,STATES "THERE IS ALOT OF FRAUD OUT THERE & HE DOESN'T WANT TO WASTE ANYONES TIME SINCE HE DOESN'T LIKE TO STAY IN THE HOUSE FOR LONG PERIODS OF TIME."HE IS NOT SURE IF HE NEEDS A RW SINCE HE CAN GET ONE FROM THE COMMUNITY FOR FREE, & GIVE IT BACK ONCE HE DOESN'T NEED IT ANYMORE.EXPLAINED THAT WE CAN ONLY RECOMMEND THE SERVICES BASED ON THE MEDICAL NEED PER THE MD ORDER, & IF HE CLEARLY KNOWS HE WILL NOT BE HOMEBOUND, THEN HE WOULD NOT QUALIFY FOR HHC.PATIENT THEN SAID WELL HE MAY STAY IN THE HOUSE FOR 1ST 2 DAYS.PATIENT VOICED UNDERSTANDING OF WHAT I EXPLAINED ABOUT HHC.[facility] CHOSEN IF ORDERED FOR HHC-KRISTEN REP AWARE & FOLLOWING.  02/21/14 Teletha Petrea RN,BSN NCM [phone] PT/OT-HH,(RW-BUT PATIENT STATES UNABLE TO USE IN SMALL APT).PROVIDE [facility] AGENCY LIST.COPD GOLD.AWAIT FINAL  HHRN-DISEASE MGMNT/PT/OT ORDER.

## 2014-02-21 NOTE — Evaluation (Signed)
Occupational Therapy Evaluation Patient Details Name: Shane Watson MRN: [id number] DOB: [birth year] Today's Date: 02/21/2014    History of Present Illness This 68 y.o. male admitted with sepsis due to cellulitis Lt. LE;  Pt with PMH that includes COPD; pulmonary vascular congestion; CAD; s/p CABG; CKD; HTN; vertigo   Clinical Impression   Pt admitted with above. He demonstrates the below listed deficits and will benefit from continued OT to maximize safety and independence with BADLs.  Pt presents to OT with generalized weakness and Lt. LE edema and pain that increases when he flexes his knee thus limiting his ability to access Lt foot for LB ADLs.  Overall he requires mod A for LB ADLs.  Anticipate he will be able to return to modified independence once edema and pain decreases.  He has no assistance at home upon discharge and may benefit from use of AE.       Follow Up Recommendations  Home health OT;Supervision - Intermittent    Equipment Recommendations  None recommended by OT    Recommendations for Other Services       Precautions / Restrictions Precautions Precautions: Fall      Mobility Bed Mobility                  Transfers Overall transfer level: Needs assistance Equipment used: Rolling walker (2 wheeled) Transfers: Sit to/from [facility] Sit to Stand: Min guard Stand pivot transfers: Min guard            Balance Overall balance assessment: Needs assistance Sitting-balance support: Feet supported Sitting balance-Leahy Scale: Good     Standing balance support: During functional activity Standing balance-Leahy Scale: Fair                              ADL Overall ADL's : Needs assistance/impaired Eating/Feeding: Independent;Sitting   Grooming: Wash/dry hands;Wash/dry face;Oral care;Brushing hair;Min guard   Upper Body Bathing: Set up;Sitting   Lower Body Bathing: Moderate assistance;Sit to/from stand Lower Body  Bathing Details (indicate cue type and reason): unable to access Lt. LE Upper Body Dressing : Set up;Sitting   Lower Body Dressing: Moderate assistance;Sit to/from stand Lower Body Dressing Details (indicate cue type and reason): Pt unable to access Lt. LE due to pain Toilet Transfer: Min guard;Ambulation;RW;BSC   Toileting- Clothing Manipulation and Hygiene: Minimal assistance;Sit to/from stand       Functional mobility during ADLs: Min guard;Rolling walker General ADL Comments: Pt reports he wears compressive stockings at home.  Lt. LE red and edematous with increase pain with knee flexion      Vision                     Perception     Praxis      Pertinent Vitals/Pain See vitals flow sheet.     Hand Dominance Right   Extremity/Trunk Assessment Upper Extremity Assessment Upper Extremity Assessment: Overall WFL for tasks assessed   Lower Extremity Assessment Lower Extremity Assessment: Defer to PT evaluation   Cervical / Trunk Assessment Cervical / Trunk Assessment: Normal   Communication Communication Communication: No difficulties   Cognition Arousal/Alertness: Awake/alert Behavior During Therapy: WFL for tasks assessed/performed Overall Cognitive Status: Within Functional Limits for tasks assessed                     General Comments       Exercises  Shoulder Instructions      Home Living Family/patient expects to be discharged to:: Private residence Living Arrangements: Alone   Type of Home: Apartment             Bathroom Shower/Tub: Tub/shower unit;Curtain Shower/tub characteristics: Architectural technologist: Handicapped height     Home Equipment: Grab bars - toilet;Grab bars - tub/shower;Other (comment) (Home 02)          Prior Functioning/Environment Level of Independence: Independent;Independent with assistive device(s)        Comments: Pt reports he does not cook, he either goes out, or he goes to a  friend's. He reports he drives and wears 02 intermittently - he states he wears it when he walks     OT Diagnosis: Generalized weakness;Acute pain   OT Problem List: Decreased strength;Decreased activity tolerance;Impaired balance (sitting and/or standing);Decreased knowledge of use of DME or AE;Obesity;Pain   OT Treatment/Interventions: Self-care/ADL training;DME and/or AE instruction;Therapeutic activities;Patient/family education;Balance training    OT Goals(Current goals can be found in the care plan section) Acute Rehab OT Goals Patient Stated Goal: to get better OT Goal Formulation: With patient Time For Goal Achievement: 03/07/14 Potential to Achieve Goals: Good ADL Goals Pt Will Perform Grooming: with modified independence;standing Pt Will Perform Lower Body Bathing: with modified independence;sit to/from stand Pt Will Perform Lower Body Dressing: with modified independence;sit to/from stand Pt Will Transfer to Toilet: with modified independence;ambulating;regular height toilet;grab bars Pt Will Perform Toileting - Clothing Manipulation and hygiene: with modified independence;sit to/from stand Pt Will Perform Tub/Shower Transfer: Tub transfer;with modified independence;ambulating;rolling walker  OT Frequency: Min 2X/week   Barriers to D/C: Decreased caregiver support          Co-evaluation              End of Session Equipment Utilized During Treatment: Rolling walker;Oxygen Nurse Communication: Mobility status  Activity Tolerance: Patient tolerated treatment well Patient left: in chair;with call bell/phone within reach;with chair alarm set   Time: [id number] OT Time Calculation (min): 18 min Charges:  OT General Charges $OT Visit: 1 Procedure OT Evaluation $Initial OT Evaluation Tier I: 1 Procedure OT Treatments $Self Care/Home Management : 8-22 mins G-Codes:    Zykiria Bruening M 18-Mar-2014, 12:50 PM

## 2014-02-22 LAB — CBC
HCT: 39.2 % (ref 39.0–52.0)
Hemoglobin: 12.8 g/dL — ABNORMAL LOW (ref 13.0–17.0)
MCH: 28.6 pg (ref 26.0–34.0)
MCHC: 32.7 g/dL (ref 30.0–36.0)
MCV: 87.7 fL (ref 78.0–100.0)
PLATELETS: 102 10*3/uL — AB (ref 150–400)
RBC: 4.47 MIL/uL (ref 4.22–5.81)
RDW: 15.6 % — ABNORMAL HIGH (ref 11.5–15.5)
WBC: 12.1 10*3/uL — ABNORMAL HIGH (ref 4.0–10.5)

## 2014-02-22 LAB — BASIC METABOLIC PANEL
Anion gap: 8 (ref 5–15)
BUN: 29 mg/dL — AB (ref 6–23)
CO2: 28 mEq/L (ref 19–32)
Calcium: 8.5 mg/dL (ref 8.4–10.5)
Chloride: 100 mEq/L (ref 96–112)
Creatinine, Ser: 2.21 mg/dL — ABNORMAL HIGH (ref 0.50–1.35)
GFR calc Af Amer: 33 mL/min — ABNORMAL LOW (ref >90)
GFR calc non Af Amer: 29 mL/min — ABNORMAL LOW (ref >90)
GLUCOSE: 189 mg/dL — AB (ref 70–99)
Potassium: 4.7 mEq/L (ref 3.7–5.3)
Sodium: 136 mEq/L — ABNORMAL LOW (ref 137–147)

## 2014-02-22 MED ORDER — VANCOMYCIN HCL 10 G IV SOLR
2000.0000 mg | INTRAVENOUS | Status: DC
  Administered 2014-02-22: 2000 mg via INTRAVENOUS
  Filled 2014-02-22: qty 2000

## 2014-02-22 MED ORDER — DOXAZOSIN MESYLATE 8 MG PO TABS
8.0000 mg | ORAL_TABLET | Freq: Every day | ORAL | Status: DC
  Administered 2014-02-22 – 2014-02-23 (×2): 8 mg via ORAL
  Filled 2014-02-22 (×2): qty 1

## 2014-02-22 MED ORDER — DOXYCYCLINE HYCLATE 100 MG PO TABS
100.0000 mg | ORAL_TABLET | Freq: Two times a day (BID) | ORAL | Status: DC
  Administered 2014-02-22 – 2014-02-23 (×3): 100 mg via ORAL
  Filled 2014-02-22 (×5): qty 1

## 2014-02-22 NOTE — Progress Notes (Signed)
[facility] PROGRESS NOTE  AMADOR [place] [id number] DOB: [birth year] DOA: 02/20/2014 PCP: Shirline Frees, MD  Assessment/Plan:  Principal Problem:   Cellulitis of left lower extremity - Change patient from vancomycin IV to Doxycycline - monitor cbc's  Active Problems:     CAD (coronary artery disease) of artery bypass graft - aspirin and lipitor on board - no chest pain reported to me by patient and family    COPD - compensated currently - continue current regimen    Dyslipidemia - stable, pt on lipitor    CKD (chronic kidney disease) stage 4, GFR 15-29 ml/min - continue to monitor serum creatinine levels - currently trending down and suspect continued improvement once vancomycin discontinued.   Code Status: full Family Communication: discussed with pt and family in room Disposition Plan: Pending continued improvement in condition.   Consultants:  None  Antibiotics:  Vancomycin  HPI/Subjective: No new complaints reported, main concern was about time of administration of home copd maintenance regimen  Objective: Filed Vitals:   02/22/14 1256  BP: 141/47  Pulse: 65  Temp: 97.8 F (36.6 C)  Resp: 16    Intake/Output Summary (Last 24 hours) at 02/22/14 1454 Last data filed at 02/22/14 1257  Gross per 24 hour  Intake    960 ml  Output   1725 ml  Net   -765 ml   Filed Weights   02/20/14 2016 02/21/14 0520 02/22/14 0459  Weight: 159.031 kg (350 lb 9.6 oz) 159.848 kg (352 lb 6.4 oz) 160.664 kg (354 lb 3.2 oz)    Exam:   General:  Pt in nad, alert and awake  Cardiovascular: rrr, no mrg  Respiratory: no wheezes, [place] in place, no increased wob  Abdomen: obese, soft, Nd  Musculoskeletal: no cyanosis or clubbing  Skin: LLE cellulitis affecting leg  Data Reviewed: Basic Metabolic Panel:  Recent Labs Lab 02/20/14 1642 02/21/14 0422 02/22/14 0010  NA 135* 135* 136*  K 5.0 4.7 4.7  CL 95* 98 100  CO2 29 27 28   GLUCOSE 173* 153*  189*  BUN 22 31* 29*  CREATININE 2.53* 3.05* 2.21*  CALCIUM 8.9 8.4 8.5   Liver Function Tests: No results found for this basename: AST, ALT, ALKPHOS, BILITOT, PROT, ALBUMIN,  in the last 168 hours No results found for this basename: LIPASE, AMYLASE,  in the last 168 hours No results found for this basename: AMMONIA,  in the last 168 hours CBC:  Recent Labs Lab 02/20/14 1436 02/20/14 1642 02/21/14 0422 02/22/14 0010  WBC 17.4* 17.9* 13.9* 12.1*  NEUTROABS  --  16.2*  --   --   HGB 14.0* 14.1 12.6* 12.8*  HCT 44.5 43.9 39.5 39.2  MCV 87.9 89.4 87.8 87.7  PLT  --  101* 101* 102*   Cardiac Enzymes: No results found for this basename: CKTOTAL, CKMB, CKMBINDEX, TROPONINI,  in the last 168 hours BNP (last 3 results) No results found for this basename: PROBNP,  in the last 8760 hours CBG: No results found for this basename: GLUCAP,  in the last 168 hours  Recent Results (from the past 240 hour(s))  CULTURE, BLOOD (ROUTINE X 2)     Status: None   Collection Time    02/20/14  4:38 PM      Result Value Ref Range Status   Specimen Description BLOOD LEFT ANTECUBITAL   Final   Special Requests BOTTLES DRAWN AEROBIC AND ANAEROBIC 5 CC    Final   Culture  Setup Time  Final   Value: 02/20/2014 18:51     Performed at [facility]   Culture     Final   Value:        BLOOD CULTURE RECEIVED NO GROWTH TO DATE CULTURE WILL BE HELD FOR 5 DAYS BEFORE ISSUING A FINAL NEGATIVE REPORT     Performed at [facility]   Report Status PENDING   Incomplete  URINE CULTURE     Status: None   Collection Time    02/20/14  6:09 PM      Result Value Ref Range Status   Specimen Description URINE, CLEAN CATCH   Final   Special Requests NONE   Final   Culture  Setup Time     Final   Value: 02/20/2014 21:47     Performed at [place]     Final   Value: NO GROWTH     Performed at [facility]   Culture     Final   Value: NO GROWTH     Performed at  [facility]   Report Status 02/21/2014 FINAL   Final  CULTURE, BLOOD (ROUTINE X 2)     Status: None   Collection Time    02/20/14  6:30 PM      Result Value Ref Range Status   Specimen Description BLOOD LEFT HAND   Final   Special Requests BOTTLES DRAWN AEROBIC ONLY 6CC   Final   Culture  Setup Time     Final   Value: 02/20/2014 22:21     Performed at [facility]   Culture     Final   Value:        BLOOD CULTURE RECEIVED NO GROWTH TO DATE CULTURE WILL BE HELD FOR 5 DAYS BEFORE ISSUING A FINAL NEGATIVE REPORT     Performed at [facility]   Report Status PENDING   Incomplete     Studies: Dg Chest Port 1 View  02/20/2014   CLINICAL DATA:  Shortness breath and weakness.  EXAM: PORTABLE CHEST - 1 VIEW  COMPARISON:  CT chest 10/20/2013.  Two-view chest x-ray 08/07/2013.  FINDINGS: The heart size is exaggerated by low lung volumes. Mild pulmonary vascular congestion is evident. Bibasilar airspace disease likely reflects atelectasis. Mild prominence of the minor fissure is stable. The visualized soft tissues and bony thorax are unremarkable.  IMPRESSION: 1. Mild pulmonary vascular congestion is new. 2. Low lung volumes with mild bibasilar atelectasis.   Electronically Signed   By: Lawrence Santiago M.D.   On: 02/20/2014 16:59    Scheduled Meds: . aspirin  81 mg Oral Daily  . atorvastatin  40 mg Oral QHS  . budesonide-formoterol  2 puff Inhalation BID  . colesevelam  625 mg Oral Q breakfast  . doxycycline  100 mg Oral Q12H  . gabapentin  300 mg Oral BID  . guaiFENesin  1,200 mg Oral BID  . loratadine  10 mg Oral Daily  . magnesium oxide  400 mg Oral Daily  . pantoprazole  40 mg Oral Daily  . potassium chloride SA  20 mEq Oral Daily  . sodium chloride  3 mL Intravenous Q12H  . tiotropium  18 mcg Inhalation Daily  . tiZANidine  4 mg Oral BID   Continuous Infusions:    Time spent: > 35 minutes    Velvet Bathe  [facility] Pager [phone] If 7PM-7AM,  please contact night-coverage at [email or web address], password [facility] 02/22/2014, 2:54  PM  LOS: 2 days

## 2014-02-22 NOTE — Progress Notes (Signed)
Physical Therapy Treatment Patient Details Name: Shane Watson MRN: [id number] DOB: 1945-08-22 Today's Date: 2014-03-15    History of Present Illness This 68 y.o. male admitted with sepsis due to cellulitis Lt. LE;  Pt with PMH that includes COPD; pulmonary vascular congestion; CAD; s/p CABG; CKD; HTN; vertigo    PT Comments    Pt progressing well, able to bear more wt on LLE today, less dependent on RW and use of UEs; bil LEs continue to be edematous;  Follow Up Recommendations  Home health PT     Equipment Recommendations  None recommended by PT    Recommendations for Other Services       Precautions / Restrictions Precautions Precautions: Fall Restrictions Weight Bearing Restrictions: No    Mobility  Bed Mobility Overal bed mobility: Modified Independent                Transfers Overall transfer level: Needs assistance Equipment used: Rolling walker (2 wheeled) Transfers: Sit to/from Stand Sit to Stand: Supervision         General transfer comment: cues for safety and hand placement  Ambulation/Gait Ambulation/Gait assistance: Supervision;Min guard Ambulation Distance (Feet): 260 Feet Assistive device: Rolling walker (2 wheeled);None (pt carrying walker toward end of trip) Gait Pattern/deviations: Step-through pattern;Wide base of support     General Gait Details: cues for RW technique, pt able to bear more wt on LLE, less dependent on RW   Stairs            Wheelchair Mobility    Modified Rankin (Stroke Patients Only)       Balance                                    Cognition Arousal/Alertness: Awake/alert Behavior During Therapy: WFL for tasks assessed/performed Overall Cognitive Status: Within Functional Limits for tasks assessed                      Exercises      General Comments        Pertinent Vitals/Pain Pain Assessment: 0-10 Pain Score: 3  Pain Location: LLE Pain Descriptors /  Indicators: Constant Pain Intervention(s): Monitored during session;Other (comment) (elevated LLE)    Home Living                      Prior Function            PT Goals (current goals can now be found in the care plan section) Acute Rehab PT Goals Patient Stated Goal: to get better Time For Goal Achievement: 02/28/14 Potential to Achieve Goals: Good Progress towards PT goals: Progressing toward goals    Frequency  Min 3X/week    PT Plan Current plan remains appropriate    Co-evaluation             End of Session Equipment Utilized During Treatment: Gait belt;Oxygen Activity Tolerance: Patient tolerated treatment well Patient left: in chair;with call bell/phone within reach;with chair alarm set;with family/visitor present     Time: [id number] PT Time Calculation (min): 19 min  Charges:  $Gait Training: 8-22 mins                    G Codes:      Tanyah Debruyne 15-Mar-2014, 3:28 PM

## 2014-02-22 NOTE — Progress Notes (Signed)
Occupational Therapy Treatment Patient Details Name: Shane Watson MRN: [id number] DOB: 05/02/1946 Today's Date: 02/22/2014    History of present illness This 68 y.o. male admitted with sepsis due to cellulitis Lt. LE;  Pt with PMH that includes COPD; pulmonary vascular congestion; CAD; s/p CABG; CKD; HTN; vertigo   OT comments  Pt up to the bathroom for grooming and toileting today. Educated on purse lip breathing technique with activity.  Follow Up Recommendations  Home health OT;Supervision - Intermittent    Equipment Recommendations  None recommended by OT    Recommendations for Other Services      Precautions / Restrictions Precautions Precautions: Fall Restrictions Weight Bearing Restrictions: No       Mobility Bed Mobility Overal bed mobility: Modified Independent                Transfers Overall transfer level: Needs assistance Equipment used: Rolling walker (2 wheeled) Transfers: Sit to/from Stand Sit to Stand: Min guard         General transfer comment: cues for safety and hand placement    Balance                                   ADL       Grooming: Supervision/safety;Standing;Oral care (shaving)                   Toilet Transfer: Min guard;RW;Ambulation;Comfort height toilet;Grab bars             General ADL Comments: Pt states he has a grab bar outside of tub but not in tub. He has comfort height commode at home with bar next to also. He has a metal type sock aid that stretches sock to slide foot into sock but he has to reach and pull up sock. educated on plastic sock aid with rope handles but feel his L LE skin integrity would currently be a contraindication for using the sock aid right now as the plastic could rub LE. Pt verbalizes understanding but states he appreciates the information for future options. Pt used walker into bathroom but did not use it to return from bathroom back to bed. Educated on purse lip  breathing technique as pt with 2/4 dyspnea with activity.       Vision                     Perception     Praxis      Cognition   Behavior During Therapy: [facility] for tasks assessed/performed Overall Cognitive Status: Within Functional Limits for tasks assessed                       Extremity/Trunk Assessment               Exercises     Shoulder Instructions       General Comments      Pertinent Vitals/ Pain       5/10 L LE; reposition, elevate. O2 sats on RA while grooming 92%, replaced O2 after shaving activity.  Home Living                                          Prior Functioning/Environment              Frequency  Min 2X/week     Progress Toward Goals  OT Goals(current goals can now be found in the care plan section)  Progress towards OT goals: Progressing toward goals     Plan Discharge plan remains appropriate    Co-evaluation                 End of Session Equipment Utilized During Treatment: Rolling walker;Oxygen   Activity Tolerance Patient tolerated treatment well   Patient Left in bed;with call bell/phone within reach;with bed alarm set   Nurse Communication          Time: [id number] OT Time Calculation (min): 41 min  Charges: OT General Charges $OT Visit: 1 Procedure OT Treatments $Self Care/Home Management : 23-37 mins $Therapeutic Activity: 8-22 mins  Jules Schick [id number] 02/22/2014, 12:17 PM

## 2014-02-22 NOTE — Progress Notes (Signed)
ANTIBIOTIC CONSULT NOTE - Follow-up  Pharmacy Consult for Vancomycin Indication: Cellulitis  Allergies  Allergen Reactions  . Sulfa Antibiotics Hives  . Aspirin Swelling    Takes aspirin.  Marland Kitchen Penicillins Hives    Patient Measurements: Height: 5' 11.5" (181.6 cm) Weight: 354 lb 3.2 oz (160.664 kg) IBW/kg (Calculated) : 76.45 Adjusted Body Weight:   Vital Signs: Temp: 98.7 F (37.1 C) (08/05 2131) Temp src: Oral (08/05 2131) BP: 120/47 mmHg (08/06 0459) Pulse Rate: 72 (08/06 0459) Intake/Output from previous day: 08/05 0701 - 08/06 0700 In: 720 [P.O.:720] Out: 1575 [Urine:1575] Intake/Output from this shift:    Labs:  Recent Labs  02/20/14 1642 02/21/14 0422 02/22/14 0010  WBC 17.9* 13.9* 12.1*  HGB 14.1 12.6* 12.8*  PLT 101* 101* 102*  CREATININE 2.53* 3.05* 2.21*   Estimated Creatinine Clearance: 49.9 ml/min (by C-G formula based on Cr of 2.21). No results found for this basename: VANCOTROUGH, VANCOPEAK, VANCORANDOM, [place], GENTPEAK, GENTRANDOM, TOBRATROUGH, TOBRAPEAK, TOBRARND, AMIKACINPEAK, AMIKACINTROU, AMIKACIN,  in the last 72 hours   Microbiology: Recent Results (from the past 720 hour(s))  CULTURE, BLOOD (ROUTINE X 2)     Status: None   Collection Time    02/20/14  4:38 PM      Result Value Ref Range Status   Specimen Description BLOOD LEFT ANTECUBITAL   Final   Special Requests BOTTLES DRAWN AEROBIC AND ANAEROBIC 5 CC    Final   Culture  Setup Time     Final   Value: 02/20/2014 18:51     Performed at [facility]   Culture     Final   Value:        BLOOD CULTURE RECEIVED NO GROWTH TO DATE CULTURE WILL BE HELD FOR 5 DAYS BEFORE ISSUING A FINAL NEGATIVE REPORT     Performed at [facility]   Report Status PENDING   Incomplete  URINE CULTURE     Status: None   Collection Time    02/20/14  6:09 PM      Result Value Ref Range Status   Specimen Description URINE, CLEAN CATCH   Final   Special Requests NONE   Final   Culture   Setup Time     Final   Value: 02/20/2014 21:47     Performed at [place]     Final   Value: NO GROWTH     Performed at [facility]   Culture     Final   Value: NO GROWTH     Performed at [facility]   Report Status 02/21/2014 FINAL   Final  CULTURE, BLOOD (ROUTINE X 2)     Status: None   Collection Time    02/20/14  6:30 PM      Result Value Ref Range Status   Specimen Description BLOOD LEFT HAND   Final   Special Requests BOTTLES DRAWN AEROBIC ONLY [place]   Final   Culture  Setup Time     Final   Value: 02/20/2014 22:21     Performed at [facility]   Culture     Final   Value:        BLOOD CULTURE RECEIVED NO GROWTH TO DATE CULTURE WILL BE HELD FOR 5 DAYS BEFORE ISSUING A FINAL NEGATIVE REPORT     Performed at [facility]   Report Status PENDING   Incomplete    Medical History: Past Medical History  Diagnosis Date  .  Allergy   . Anemia   . Neuropathy   . COPD (chronic obstructive pulmonary disease)   . Hyperthyroidism   . Hyperlipidemia   . Vertigo     Assessment: 79 yoM presented to urgent care with dizziness and SOB, transported to [facility] for symptoms along with cellulitis of LLE, fever, and leukocytosis.  Pharmacy consulted to start Vancomycin for cellulitis. Noted allergies to penicillins and sulfa. PMHx: COPD, CAD, morbid obesity, hx lower extremity cellulitis  8/4 >> Vancomycin >>  Tmax: afebrile WBCs: 12.1K trending down Renal: SCr 2.21 trending down, CrCl 32 (N) LA: 2.88 (8/4)  8/4 blood: ngtd 8/4 urine: ng F  Goal of Therapy:  Vancomycin trough level 10-15 mcg/ml  Plan:  Adjust dosing to Vancomycin 2g IV Q24H for improved renal function F/u drug levels at steady state Follow renal function  Kizzie Furnish, PharmD Pager: [phone] 02/22/2014 8:42 AM

## 2014-02-23 ENCOUNTER — Telehealth: Payer: Self-pay | Admitting: Cardiology

## 2014-02-23 LAB — BASIC METABOLIC PANEL
ANION GAP: 8 (ref 5–15)
BUN: 21 mg/dL (ref 6–23)
CALCIUM: 8.8 mg/dL (ref 8.4–10.5)
CO2: 27 meq/L (ref 19–32)
Chloride: 102 mEq/L (ref 96–112)
Creatinine, Ser: 1.64 mg/dL — ABNORMAL HIGH (ref 0.50–1.35)
GFR, EST AFRICAN AMERICAN: 48 mL/min — AB (ref >90)
GFR, EST NON AFRICAN AMERICAN: 41 mL/min — AB (ref >90)
Glucose, Bld: 213 mg/dL — ABNORMAL HIGH (ref 70–99)
Potassium: 4.8 mEq/L (ref 3.7–5.3)
SODIUM: 137 meq/L (ref 137–147)

## 2014-02-23 LAB — CBC
HCT: 38.4 % — ABNORMAL LOW (ref 39.0–52.0)
Hemoglobin: 12.3 g/dL — ABNORMAL LOW (ref 13.0–17.0)
MCH: 28.2 pg (ref 26.0–34.0)
MCHC: 32 g/dL (ref 30.0–36.0)
MCV: 88.1 fL (ref 78.0–100.0)
PLATELETS: 105 10*3/uL — AB (ref 150–400)
RBC: 4.36 MIL/uL (ref 4.22–5.81)
RDW: 15.4 % (ref 11.5–15.5)
WBC: 9.8 10*3/uL (ref 4.0–10.5)

## 2014-02-23 MED ORDER — OXYCODONE HCL 5 MG PO TABS: 5.0000 mg | ORAL_TABLET | ORAL | Status: DC | PRN

## 2014-02-23 MED ORDER — AMLODIPINE BESYLATE 10 MG PO TABS
10.0000 mg | ORAL_TABLET | Freq: Every day | ORAL | Status: DC
  Administered 2014-02-23: 10 mg via ORAL
  Filled 2014-02-23: qty 1

## 2014-02-23 MED ORDER — DOXYCYCLINE HYCLATE 100 MG PO TABS: 100.0000 mg | ORAL_TABLET | Freq: Two times a day (BID) | ORAL | Status: DC

## 2014-02-23 MED ORDER — AMLODIPINE BESYLATE 10 MG PO TABS: 10.0000 mg | ORAL_TABLET | Freq: Every day | ORAL | Status: DC

## 2014-02-23 NOTE — Progress Notes (Signed)
CARE MANAGEMENT NOTE 02/23/2014  Patient:  MAKSON, FUSILLO   Account Number:  [id number]  Date Initiated:  02/21/2014  Documentation initiated by:  [facility]  Subjective/Objective Assessment:   68 Y/O M ADMITTED W/L LWR EXTREMITY CELLULITIS.[id number].     Action/Plan:   FROM HOME ALONE.   Anticipated DC Date:  02/24/2014   Anticipated DC Plan:  [place]  CM consult      Choice offered to / List presented to:  C-1 Patient           Status of service:  In process, will continue to follow Medicare Important Message given?  NA - LOS <3 / Initial given by admissions (If response is "NO", the following Medicare IM given date fields will be blank) Date Medicare IM given:  02/23/2014 Medicare IM given by:  [facility] Date Additional Medicare IM given:   Additional Medicare IM given by:    Discharge Disposition:    Per UR Regulation:  Reviewed for med. necessity/level of care/duration of stay  If discussed at [place] of Stay Meetings, dates discussed:    Comments:  02/23/14 Aylene Acoff RN,BSN NCM [phone] NOTED PT-STILL RECOMMENDING HHPT.INFORMED PATIENT AGAIN ABOUT RECOMMENDATIONS.PATIENT STATES HE WILL STAY W/HIS FRIEND @ D/C ADDRESS:[name] [address] [place] [id number].[id number].MD UPDATED ABOUT PATIENT CONCERNS ABOUT WHETHER OR NOT HE NEEDS HHC.TC [facility] SPOKE TO MARIE TO INFORM ABOUT ADDRESS.  02/22/14 Imre Vecchione RN,BSN NCM [phone] SPOKE TO PATIENT AT LENGTH ABOUT D/C PLANS-HE IS UNSURE IF HE NEEDS HHC,STATES "THERE IS ALOT OF FRAUD OUT THERE & HE DOESN'T WANT TO WASTE ANYONES TIME SINCE HE DOESN'T LIKE TO STAY IN THE HOUSE FOR LONG PERIODS OF TIME."HE IS NOT SURE IF HE NEEDS A RW SINCE HE CAN GET ONE FROM THE COMMUNITY FOR FREE, & GIVE IT BACK ONCE HE DOESN'T NEED IT ANYMORE.EXPLAINED THAT WE CAN ONLY RECOMMEND THE SERVICES BASED ON THE MEDICAL NEED PER THE MD ORDER, & IF HE CLEARLY KNOWS HE WILL NOT BE  HOMEBOUND, THEN HE WOULD NOT QUALIFY FOR HHC.PATIENT THEN SAID WELL HE MAY STAY IN THE HOUSE FOR 1ST 2 DAYS.PATIENT VOICED UNDERSTANDING OF WHAT I EXPLAINED ABOUT HHC.[facility] CHOSEN IF ORDERED FOR HHC-KRISTEN REP AWARE & FOLLOWING.  02/21/14 Kilani Joffe RN,UNABLE TO USE IN SMALL APT).PROVIDE [facility] AGENCY LIST.COPD GOLD.AWAIT FINAL HHRN-DISEASE MGMNT/PT/OT ORDER.

## 2014-02-23 NOTE — Progress Notes (Signed)
Occupational Therapy Treatment Patient Details Name: Shane Watson MRN: [id number] DOB: [birth year] Today's Date: 02/23/2014    History of present illness This 68 y.o. male admitted with sepsis due to cellulitis Lt. LE;  Pt with PMH that includes COPD; pulmonary vascular congestion; CAD; s/p CABG; CKD; HTN; vertigo   OT comments  Discussed at length AE options for LB dressing and safety concerns with L LE skin integrity. Pt aware of options and importance of protecting skin. Will benefit from continued OT services to progress safety and independence with self care tasks.   Follow Up Recommendations  Home health OT;Supervision - Intermittent    Equipment Recommendations  None recommended by OT    Recommendations for Other Services      Precautions / Restrictions Precautions Precautions: Fall Restrictions Weight Bearing Restrictions: No       Mobility Bed Mobility                  Transfers                      Balance                                   ADL                       Lower Body Dressing: Min guard;Sitting/lateral leans Lower Body Dressing Details (indicate cue type and reason): min guard to doff L sock but with increased effort. unable to don sock over toes. Able to use reacher to start sock over toes to midfoot. Then reached down and pulled sock up over heel with min guard and effort.                General ADL Comments: Discussed at length LB dressing as pt's skin is fragile and concerns about  pt using his "sock harness' that he owns made of metal due to his skin issues. Also feel the plastic sock aid could possibly be a risk for his skin. Discussed use of reacher to only start sock over toes ONLY and not try to continue pulling up with reacher due to risk of jabbing his skin with the reacher. Pt did well with using reacher to start sock over toes only to midfoot and then reached down with hand to pull sock up. Min  guard and effort. He states he could dress on his lower couch with may be easier than higher recliner here. He is contemplating the idea of going to a friend's house and stay for a bit so he will have help if he needs it. THe friend has a walk in shower whereas he has a tub. Discussed safety with not stepping over a tub right now if he cant pick his L LE up high enough to attempt step over. HE will likely still need assist if he needs to wear TEDS. Discussed other uses of reacher to retrieve objects and adjust covers, etc. HE is able to reacher far enough to start shorts over LEs without AE.       Vision                     Perception     Praxis      Cognition   Behavior During Therapy: [facility] for tasks assessed/performed Overall Cognitive Status: Within Functional Limits for tasks assessed  Extremity/Trunk Assessment               Exercises     Shoulder Instructions       General Comments      Pertinent Vitals/ Pain       Pain Assessment: 0-10 Pain Score: 3  Pain Location: L LE Pain Descriptors / Indicators: Constant Pain Intervention(s): Monitored during session;Repositioned  Home Living                                          Prior Functioning/Environment              Frequency Min 2X/week     Progress Toward Goals  OT Goals(current goals can now be found in the care plan section)  Progress towards OT goals: Progressing toward goals     Plan Discharge plan remains appropriate    Co-evaluation                 End of Session Equipment Utilized During Treatment: Oxygen   Activity Tolerance Patient tolerated treatment well   Patient Left in chair;with call bell/phone within reach;with chair alarm set   Nurse Communication          Time: [id number] OT Time Calculation (min): 34 min  Charges: OT General Charges $OT Visit: 1 Procedure OT Treatments $Self Care/Home Management : 23-37  mins  Jules Schick [id number] 02/23/2014, 11:24 AM

## 2014-02-23 NOTE — Progress Notes (Signed)
CSW received consult for COPD Gold Protocol, though patient does not meet qualifications (only 1 admission within the past 6 months).   CSW signing off.   Sirenia Whitis, LCSW [facility] Clinical Social Worker cell #: [phone]    

## 2014-02-23 NOTE — Discharge Summary (Signed)
Physician Discharge Summary  Shane Watson [id number] DOB: Dec 17, 1945 DOA: 02/20/2014  PCP: Shirline Frees, MD  Admit date: 02/20/2014 Discharge date: 02/23/2014  Time spent: > 35 minutes  Recommendations for Outpatient Follow-up:  1. Please reassess S creatinine on f/u 2. Monitor cellulitis and decide whether or not patient will require a more prolonged antibiotic course  Discharge Diagnoses:  Principal Problem:   Cellulitis of left lower extremity Active Problems:   COPD  GOLD II with reversibilty    CAD (coronary artery disease) of artery bypass graft   Leukocytosis, unspecified   COPD exacerbation   Acute respiratory failure with hypoxia   Dyslipidemia   CKD (chronic kidney disease) stage 4, GFR 15-29 ml/min   Discharge Condition: stable  Diet recommendation: Regular diet  Filed Weights   02/21/14 0520 02/22/14 0459 02/23/14 0556  Weight: 159.848 kg (352 lb 6.4 oz) 160.664 kg (354 lb 3.2 oz) 160.755 kg (354 lb 6.4 oz)    History of present illness:  From original history of present illness 68 year old male with past medical history of hypertension, morbid obesity, dyslipidemia, CAD, COPD who presented initially to [facility] for evaluation for dizziness and shortness of breath. He was found to have left leg cellulitis and has had a fever of 102.6 F so he was sent to [facility] for further evaluation and treatment   Hospital Course:  Principal Problem:  Cellulitis of left lower extremity  - Changed patient from vancomycin IV to Doxycycline , and tolerating doxycycline well. - CBCs continue to trend down  Active Problems:  CAD (coronary artery disease) of artery bypass graft  - aspirin and lipitor on board  - no chest pain reported to me by patient and family   COPD  - compensated currently  - continue current regimen   Dyslipidemia  - stable, pt on lipitor   CKD (chronic kidney disease) stage 4, GFR 15-29 ml/min  - continue to monitor serum creatinine levels  -  currently trending down and suspect continued improvement once vancomycin discontinued.    Procedures:  none  Consultations:  None  Discharge Exam: Filed Vitals:   02/23/14 1313  BP: 179/77  Pulse: 72  Temp: 97.9 F (36.6 C)  Resp: 22    General: pt in nad, alert and awake Cardiovascular: rrr, no mrg Respiratory: cta bl, no wheezes  Discharge Instructions You were cared for by a hospitalist during your hospital stay. If you have any questions about your discharge medications or the care you received while you were in the hospital after you are discharged, you can call the unit and asked to speak with the hospitalist on call if the hospitalist that took care of you is not available. Once you are discharged, your primary care physician will handle any further medical issues. Please note that NO REFILLS for any discharge medications will be authorized once you are discharged, as it is imperative that you return to your primary care physician (or establish a relationship with a primary care physician if you do not have one) for your aftercare needs so that they can reassess your need for medications and monitor your lab values.  Discharge Instructions   Call MD for:  persistant nausea and vomiting    Complete by:  As directed      Call MD for:  severe uncontrolled pain    Complete by:  As directed      Call MD for:  temperature >100.4    Complete by:  As  directed      Diet - low sodium heart healthy    Complete by:  As directed      Increase activity slowly    Complete by:  As directed             Medication List    STOP taking these medications       losartan 50 MG tablet  Commonly known as:  COZAAR      TAKE these medications       acetaminophen 325 MG tablet  Commonly known as:  TYLENOL  Take 325 mg by mouth every 4 (four) hours as needed for moderate pain. Per bottle as needed for pain     albuterol 108 (90 BASE) MCG/ACT inhaler  Commonly known as:  PROVENTIL  HFA;VENTOLIN HFA  Inhale 2 puffs into the lungs every 4 (four) hours as needed for wheezing or shortness of breath (((PLAN A))).     albuterol (5 MG/ML) 0.5% nebulizer solution  Commonly known as:  PROVENTIL  Take 2.5 mg by nebulization every 4 (four) hours as needed for wheezing or shortness of breath (((PLAN B))).     amLODipine 10 MG tablet  Commonly known as:  NORVASC  Take 1 tablet (10 mg total) by mouth daily.     aspirin 81 MG tablet  Take 81 mg by mouth daily.     atorvastatin 40 MG tablet  Commonly known as:  LIPITOR  Take 40 mg by mouth at bedtime.     AYR SALINE NASAL DROPS 0.65 % (SOLN) Soln  Generic drug:  Saline  Use as needed for nasal congestion     budesonide-formoterol 160-4.5 MCG/ACT inhaler  Commonly known as:  SYMBICORT  Take 2 puffs first thing in am and then another 2 puffs about 12 hours later.     cetirizine 10 MG tablet  Commonly known as:  ZYRTEC  Take 10 mg by mouth every morning.     colesevelam 625 MG tablet  Commonly known as:  WELCHOL  Take 625 mg by mouth daily with breakfast.     doxazosin 8 MG tablet  Commonly known as:  CARDURA  Take 1 tablet by mouth daily.     doxycycline 100 MG tablet  Commonly known as:  VIBRA-TABS  Take 1 tablet (100 mg total) by mouth every 12 (twelve) hours.     furosemide 20 MG tablet  Commonly known as:  LASIX  Take 40 mg by mouth daily.     gabapentin 100 MG capsule  Commonly known as:  NEURONTIN  3 capsules by mouth twice daily     Magnesium 400 MG Caps  Take 1 capsule by mouth daily.     meclizine 12.5 MG tablet  Commonly known as:  ANTIVERT  2 tabs by mouth every morning     MUCINEX MAXIMUM STRENGTH 1200 MG Tb12  Generic drug:  Guaifenesin  Take 1 tablet by mouth 2 (two) times daily.     multivitamin with minerals Tabs tablet  Take 1 tablet by mouth at bedtime.     nitroGLYCERIN 0.4 MG SL tablet  Commonly known as:  NITROSTAT  Place 1 tablet (0.4 mg total) under the tongue every 5  (five) minutes as needed for chest pain.     OVER THE COUNTER MEDICATION  Beclomethasone Cream: apply at bedtime as needed for rash     oxyCODONE 5 MG immediate release tablet  Commonly known as:  Oxy IR/ROXICODONE  Take 1 tablet (5 mg total)  by mouth every 4 (four) hours as needed for moderate pain.     pantoprazole 40 MG tablet  Commonly known as:  PROTONIX  Take 40 mg by mouth daily.     potassium chloride SA 20 MEQ tablet  Commonly known as:  K-DUR,KLOR-CON  Take 20 mEq by mouth daily.     simethicone 80 MG chewable tablet  Commonly known as:  MYLICON  Chew 80 mg by mouth once as needed. Per box as needed for gas     Tiotropium Bromide Monohydrate 2.5 MCG/ACT Aers  Commonly known as:  SPIRIVA RESPIMAT  Inhale 2 puffs into the lungs every morning.     tiZANidine 4 MG tablet  Commonly known as:  ZANAFLEX  Take 4 mg by mouth 2 (two) times daily.       Allergies  Allergen Reactions  . Sulfa Antibiotics Hives  . Aspirin Swelling    Takes aspirin.  Marland Kitchen Penicillins Hives      The results of significant diagnostics from this hospitalization (including imaging, microbiology, ancillary and laboratory) are listed below for reference.    Significant Diagnostic Studies: Dg Chest Port 1 View  02/20/2014   CLINICAL DATA:  Shortness breath and weakness.  EXAM: PORTABLE CHEST - 1 VIEW  COMPARISON:  CT chest 10/20/2013.  Two-view chest x-ray 08/07/2013.  FINDINGS: The heart size is exaggerated by low lung volumes. Mild pulmonary vascular congestion is evident. Bibasilar airspace disease likely reflects atelectasis. Mild prominence of the minor fissure is stable. The visualized soft tissues and bony thorax are unremarkable.  IMPRESSION: 1. Mild pulmonary vascular congestion is new. 2. Low lung volumes with mild bibasilar atelectasis.   Electronically Signed   By: Lawrence Santiago M.D.   On: 02/20/2014 16:59    Microbiology: Recent Results (from the past 240 hour(s))  CULTURE, BLOOD  (ROUTINE X 2)     Status: None   Collection Time    02/20/14  4:38 PM      Result Value Ref Range Status   Specimen Description BLOOD LEFT ANTECUBITAL   Final   Special Requests BOTTLES DRAWN AEROBIC AND ANAEROBIC 5 CC    Final   Culture  Setup Time     Final   Value: 02/20/2014 18:51     Performed at [facility]   Culture     Final   Value:        BLOOD CULTURE RECEIVED NO GROWTH TO DATE CULTURE WILL BE HELD FOR 5 DAYS BEFORE ISSUING A FINAL NEGATIVE REPORT     Performed at [facility]   Report Status PENDING   Incomplete  URINE CULTURE     Status: None   Collection Time    02/20/14  6:09 PM      Result Value Ref Range Status   Specimen Description URINE, CLEAN CATCH   Final   Special Requests NONE   Final   Culture  Setup Time     Final   Value: 02/20/2014 21:47     Performed at [facility]     Final   Value: NO GROWTH     Performed at [facility]   Culture     Final   Value: NO GROWTH     Performed at [facility]   Report Status 02/21/2014 FINAL   Final  CULTURE, BLOOD (ROUTINE X 2)     Status: None   Collection Time    02/20/14  6:30 PM  Result Value Ref Range Status   Specimen Description BLOOD LEFT HAND   Final   Special Requests BOTTLES DRAWN AEROBIC ONLY 6CC   Final   Culture  Setup Time     Final   Value: 02/20/2014 22:21     Performed at [facility]   Culture     Final   Value:        BLOOD CULTURE RECEIVED NO GROWTH TO DATE CULTURE WILL BE HELD FOR 5 DAYS BEFORE ISSUING A FINAL NEGATIVE REPORT     Performed at [facility]   Report Status PENDING   Incomplete     Labs: Basic Metabolic Panel:  Recent Labs Lab 02/20/14 1642 02/21/14 0422 02/22/14 0010 02/23/14 0016  NA 135* 135* 136* 137  K 5.0 4.7 4.7 4.8  CL 95* 98 100 102  CO2 29 27 28 27   GLUCOSE 173* 153* 189* 213*  BUN 22 31* 29* 21  CREATININE 2.53* 3.05* 2.21* 1.64*  CALCIUM 8.9 8.4 8.5 8.8   Liver Function  Tests: No results found for this basename: AST, ALT, ALKPHOS, BILITOT, PROT, ALBUMIN,  in the last 168 hours No results found for this basename: LIPASE, AMYLASE,  in the last 168 hours No results found for this basename: AMMONIA,  in the last 168 hours CBC:  Recent Labs Lab 02/20/14 1436 02/20/14 1642 02/21/14 0422 02/22/14 0010 02/23/14 0016  WBC 17.4* 17.9* 13.9* 12.1* 9.8  NEUTROABS  --  16.2*  --   --   --   HGB 14.0* 14.1 12.6* 12.8* 12.3*  HCT 44.5 43.9 39.5 39.2 38.4*  MCV 87.9 89.4 87.8 87.7 88.1  PLT  --  101* 101* 102* 105*   Cardiac Enzymes: No results found for this basename: CKTOTAL, CKMB, CKMBINDEX, TROPONINI,  in the last 168 hours BNP: BNP (last 3 results) No results found for this basename: PROBNP,  in the last 8760 hours CBG: No results found for this basename: GLUCAP,  in the last 168 hours     Signed:  Velvet Bathe  [facility] 02/23/2014, 4:38 PM

## 2014-02-23 NOTE — Progress Notes (Signed)
Inpatient Diabetes Program Recommendations  AACE/ADA: New Consensus Statement on Inpatient Glycemic Control (2013)  Target Ranges:  Prepandial:   less than 140 mg/dL      Peak postprandial:   less than 180 mg/dL (1-2 hours)      Critically ill patients:  140 - 180 mg/dL   Reason for Visit: Hyperglycemia  Diabetes history: None Outpatient Diabetes medications: *N/A Current orders for Inpatient glycemic control: None  HgbA1C elevated 6.7% indicative of DM.  Inpatient Diabetes Program Recommendations Correction (SSI): Add Novolog moderate tidwc and hs HgbA1C: 6.7% - undiagnosed DM Diet: CHO mod med  Note: If DM diagnosis confirmed, will order Living Well With Diabetes book and assist pt in viewing diabetes videos on pt ed channel. Would benefit from OP Diabetes Education and will need close f/u by PCP to manage DM.  Will continue to follow. Thank you. Lorenda Peck, RD, LDN, CDE Inpatient Diabetes Coordinator [phone]

## 2014-02-23 NOTE — Telephone Encounter (Signed)
New message    Patient calling from the hospital  .      phone # @ cone [phone] -    Wants to know can he stop losartan 50 mg

## 2014-02-23 NOTE — Progress Notes (Signed)
[facility]  Referral received for potential need for Home Health services at discharge. While meeting with the patient he stated that he is still driving and likes to get out of the house and do things. I explained to the patient if he is not homebound, or if he does not require assistive device to leave his home...etc.  he does not meet MCR criteria for Home Health visits.   If patient discharges after hours, please call [phone].   Lurlean Leyden 02/23/2014, 10:42 AM

## 2014-02-23 NOTE — Telephone Encounter (Signed)
Line busy X 2.  Pt should address with MD at hospital.  Will continue to attempt to contact him.

## 2014-02-23 NOTE — Progress Notes (Signed)
Physical Therapy Treatment and Discharge from acute PT Patient Details Name: Shane Watson MRN: [id number] DOB: 01/11/1946 Today's Date: 02/23/2014    History of Present Illness This 68 y.o. male admitted with sepsis due to cellulitis Lt. LE;  Pt with PMH that includes COPD; pulmonary vascular congestion; CAD; s/p CABG; CKD; HTN; vertigo    PT Comments    Pt progressing well with mobility and has met PT goals.  Pt did not use RW today and steady.  Pt does have access to RW if he feels he needs one later.  Pt also educated on exercises he can perform to tolerance.  Since pt has met goals and education completed, will d/c from acute PT.  Recommend pt f/u with HHPT upon d/c.   Follow Up Recommendations  Home health PT     Equipment Recommendations  None recommended by PT    Recommendations for Other Services       Precautions / Restrictions Precautions Precautions: Fall Restrictions Weight Bearing Restrictions: No    Mobility  Bed Mobility                  Transfers Overall transfer level: Modified independent   Transfers: Sit to/from Stand              Ambulation/Gait Ambulation/Gait assistance: Supervision Ambulation Distance (Feet): 400 Feet Assistive device: None Gait Pattern/deviations: Step-through pattern;Wide base of support     General Gait Details: pushed IV pole for 200 however no UE support last 200 feet and pt steady without LOB, only reports mild dyspnea   Stairs            Wheelchair Mobility    Modified Rankin (Stroke Patients Only)       Balance                                    Cognition Arousal/Alertness: Awake/alert Behavior During Therapy: WFL for tasks assessed/performed Overall Cognitive Status: Within Functional Limits for tasks assessed                      Exercises General Exercises - Lower Extremity Ankle Circles/Pumps: AROM;Both;15 reps Quad Sets: AROM;Both;10 reps Long Arc Quad:  AROM;Both;10 reps;Seated Heel Slides: AROM;Both;5 reps Hip ABduction/ADduction: AROM;Standing;Both;10 reps (with BIL UE support) Hip Flexion/Marching: AROM;Both;10 reps;Standing (with BIL UE support)    General Comments        Pertinent Vitals/Pain Pain Assessment: 0-10 Pain Score: 3  Pain Location: L lower leg Pain Descriptors / Indicators: Constant Pain Intervention(s): Repositioned Pt remained on 2L O2  and SpO2 at lowest was 95% Pt educated to breathe during exercises and activity and take rest breaks as needed.    Home Living                      Prior Function            PT Goals (current goals can now be found in the care plan section) Progress towards PT goals: Goals met/education completed, patient discharged from PT    Frequency  Min 3X/week    PT Plan Other (comment) (d/c from acute PT)    Co-evaluation             End of Session Equipment Utilized During Treatment: Oxygen Activity Tolerance: Patient tolerated treatment well Patient left: in chair;with call bell/phone within reach;with family/visitor present  Time: 0928-1000 PT Time Calculation (min): 32 min  Charges:  $Gait Training: 8-22 mins $Therapeutic Exercise: 8-22 mins                    G Codes:      Shane Watson,KATHrine E Mar 09, 2014, 11:36 AM Carmelia Bake, PT, DPT 03/09/14 Pager: [phone]

## 2014-02-23 NOTE — Progress Notes (Signed)
Patient declines home health needs.

## 2014-02-23 NOTE — Progress Notes (Signed)
CARE MANAGEMENT NOTE 02/23/2014  Patient:  Shane Watson, Shane Watson   Account Number:  [id number]  Date Initiated:  02/21/2014  Documentation initiated by:  [facility]  Subjective/Objective Assessment:   68 Y/O M ADMITTED W/L LWR EXTREMITY CELLULITIS.[id number].     Action/Plan:   FROM HOME ALONE.   Anticipated DC Date:  02/24/2014   Anticipated DC Plan:  [address]  CM consult      [facility] Choice  HOME HEALTH   Choice offered to / List presented to:  C-1 Patient        [place] arranged  [place] PT      [place].   Status of service:  Completed, signed off Medicare Important Message given?  YES (If response is "NO", the following Medicare IM given date fields will be blank) Date Medicare IM given:  02/23/2014 Medicare IM given by:  [facility] Date Additional Medicare IM given:   Additional Medicare IM given by:    Discharge Disposition:  [place]  Per UR Regulation:  Reviewed for med. necessity/level of care/duration of stay  If discussed at [place] of Stay Meetings, dates discussed:    Comments:  02/23/14 KATHY MAHABIR RN,BSN NCM [phone] NOTED PT-STILL RECOMMENDING HHPT.INFORMED PATIENT AGAIN ABOUT RECOMMENDATIONS.PATIENT STATES HE WILL STAY W/HIS FRIEND @ D/C ADDRESS:Shane Watson [address] [place] [address].[phone] [id number].MD UPDATED ABOUT PATIENT CONCERNS ABOUT WHETHER OR NOT HE NEEDS HHC.TC [facility] SPOKE TO MARIE TO INFORM ABOUT ADDRESS.  02/22/14 KATHY MAHABIR RN,BSN NCM [phone] SPOKE TO PATIENT AT LENGTH ABOUT D/C PLANS-HE IS UNSURE IF HE NEEDS HHC,STATES "THERE IS ALOT OF FRAUD OUT THERE & HE DOESN'T WANT TO WASTE ANYONES TIME SINCE HE DOESN'T LIKE TO STAY IN THE HOUSE FOR LONG PERIODS OF TIME."HE IS NOT SURE IF HE NEEDS A RW SINCE HE CAN GET ONE FROM THE COMMUNITY FOR FREE, & GIVE IT BACK ONCE HE DOESN'T NEED IT ANYMORE.EXPLAINED THAT WE CAN ONLY RECOMMEND THE SERVICES BASED ON THE  MEDICAL NEED PER THE MD ORDER, & IF HE CLEARLY KNOWS HE WILL NOT BE HOMEBOUND, THEN HE WOULD NOT QUALIFY FOR HHC.PATIENT THEN SAID WELL HE MAY STAY IN THE HOUSE FOR 1ST 2 DAYS.PATIENT VOICED UNDERSTANDING OF WHAT I EXPLAINED ABOUT HHC.[facility] CHOSEN IF ORDERED FOR HHC-KRISTEN REP AWARE & FOLLOWING.  02/21/14 KATHY MAHABIR RN,BSN NCM [phone] PT/OT-HH,(RW-BUT PATIENT STATES UNABLE TO USE IN SMALL APT).PROVIDE [facility] AGENCY LIST.COPD GOLD.AWAIT FINAL HHRN-DISEASE MGMNT/PT/OT ORDER.

## 2014-02-26 DIAGNOSIS — I10 Essential (primary) hypertension: Secondary | ICD-10-CM | POA: Diagnosis not present

## 2014-02-26 DIAGNOSIS — N179 Acute kidney failure, unspecified: Secondary | ICD-10-CM | POA: Diagnosis not present

## 2014-02-26 DIAGNOSIS — L03119 Cellulitis of unspecified part of limb: Secondary | ICD-10-CM | POA: Diagnosis not present

## 2014-02-26 DIAGNOSIS — R609 Edema, unspecified: Secondary | ICD-10-CM | POA: Diagnosis not present

## 2014-02-26 DIAGNOSIS — L02419 Cutaneous abscess of limb, unspecified: Secondary | ICD-10-CM | POA: Diagnosis not present

## 2014-02-26 LAB — CULTURE, BLOOD (ROUTINE X 2)
CULTURE: NO GROWTH
Culture: NO GROWTH

## 2014-02-26 NOTE — Telephone Encounter (Signed)
Medication changes that were necessary were made while pt was in the hospital

## 2014-03-02 DIAGNOSIS — L03119 Cellulitis of unspecified part of limb: Secondary | ICD-10-CM | POA: Diagnosis not present

## 2014-03-02 DIAGNOSIS — E291 Testicular hypofunction: Secondary | ICD-10-CM | POA: Diagnosis not present

## 2014-03-02 DIAGNOSIS — L02419 Cutaneous abscess of limb, unspecified: Secondary | ICD-10-CM | POA: Diagnosis not present

## 2014-03-02 DIAGNOSIS — B356 Tinea cruris: Secondary | ICD-10-CM | POA: Diagnosis not present

## 2014-03-02 DIAGNOSIS — R609 Edema, unspecified: Secondary | ICD-10-CM | POA: Diagnosis not present

## 2014-03-02 DIAGNOSIS — N183 Chronic kidney disease, stage 3 unspecified: Secondary | ICD-10-CM | POA: Diagnosis not present

## 2014-03-04 ENCOUNTER — Other Ambulatory Visit: Payer: Self-pay | Admitting: Internal Medicine

## 2014-03-07 ENCOUNTER — Other Ambulatory Visit: Payer: Self-pay | Admitting: Internal Medicine

## 2014-03-12 DIAGNOSIS — L02419 Cutaneous abscess of limb, unspecified: Secondary | ICD-10-CM | POA: Diagnosis not present

## 2014-03-12 DIAGNOSIS — L03119 Cellulitis of unspecified part of limb: Secondary | ICD-10-CM | POA: Diagnosis not present

## 2014-03-14 DIAGNOSIS — I10 Essential (primary) hypertension: Secondary | ICD-10-CM | POA: Diagnosis not present

## 2014-03-14 DIAGNOSIS — L02419 Cutaneous abscess of limb, unspecified: Secondary | ICD-10-CM | POA: Diagnosis not present

## 2014-03-14 DIAGNOSIS — R609 Edema, unspecified: Secondary | ICD-10-CM | POA: Diagnosis not present

## 2014-03-14 DIAGNOSIS — N183 Chronic kidney disease, stage 3 unspecified: Secondary | ICD-10-CM | POA: Diagnosis not present

## 2014-03-14 DIAGNOSIS — L03119 Cellulitis of unspecified part of limb: Secondary | ICD-10-CM | POA: Diagnosis not present

## 2014-03-21 ENCOUNTER — Other Ambulatory Visit: Payer: Self-pay | Admitting: Cardiology

## 2014-03-21 DIAGNOSIS — L02419 Cutaneous abscess of limb, unspecified: Secondary | ICD-10-CM | POA: Diagnosis not present

## 2014-03-21 DIAGNOSIS — N183 Chronic kidney disease, stage 3 unspecified: Secondary | ICD-10-CM | POA: Diagnosis not present

## 2014-03-21 DIAGNOSIS — L03119 Cellulitis of unspecified part of limb: Secondary | ICD-10-CM | POA: Diagnosis not present

## 2014-03-21 DIAGNOSIS — I10 Essential (primary) hypertension: Secondary | ICD-10-CM | POA: Diagnosis not present

## 2014-03-27 ENCOUNTER — Telehealth: Payer: Self-pay | Admitting: Internal Medicine

## 2014-03-27 DIAGNOSIS — J449 Chronic obstructive pulmonary disease, unspecified: Secondary | ICD-10-CM

## 2014-03-27 NOTE — Telephone Encounter (Signed)
Called spoke with aware order placed. Will forward to PCC's.

## 2014-03-27 NOTE — Telephone Encounter (Signed)
Called spoke with pt. He reports he requested a small POC back in July. He has not heard anything. No order is in EPIC.  Please advise MW if okay to do so? thanks

## 2014-03-27 NOTE — Telephone Encounter (Signed)
Ok to place order for ambulatory 02 titration and POC if eligble.

## 2014-04-05 ENCOUNTER — Other Ambulatory Visit: Payer: Self-pay | Admitting: Internal Medicine

## 2014-04-10 ENCOUNTER — Encounter: Payer: Self-pay | Admitting: Internal Medicine

## 2014-04-11 ENCOUNTER — Telehealth: Payer: Self-pay | Admitting: Internal Medicine

## 2014-04-11 MED ORDER — BUDESONIDE-FORMOTEROL FUMARATE 160-4.5 MCG/ACT IN AERO: INHALATION_SPRAY | RESPIRATORY_TRACT | Status: DC

## 2014-04-11 NOTE — Telephone Encounter (Signed)
Called pt. He is coming by this afternoon to fill out his PA for symbicort. We have W2 on file from when he applied in June. We just need to do another form and send in medicare card with W-2. Pt will ask for me when he gets here to have him fill out form.

## 2014-04-11 NOTE — Telephone Encounter (Signed)
Magda Paganini has already faxed this. Nothing further needed

## 2014-04-11 NOTE — Telephone Encounter (Signed)
Ok thanks 

## 2014-04-11 NOTE — Telephone Encounter (Signed)
Papework done and placed in MW look at for signature. Will forward to leslie to f/u on

## 2014-04-12 DIAGNOSIS — Z23 Encounter for immunization: Secondary | ICD-10-CM | POA: Diagnosis not present

## 2014-04-12 DIAGNOSIS — L259 Unspecified contact dermatitis, unspecified cause: Secondary | ICD-10-CM | POA: Diagnosis not present

## 2014-04-12 DIAGNOSIS — E291 Testicular hypofunction: Secondary | ICD-10-CM | POA: Diagnosis not present

## 2014-04-18 ENCOUNTER — Ambulatory Visit (INDEPENDENT_AMBULATORY_CARE_PROVIDER_SITE_OTHER): Payer: Medicare Other | Admitting: Cardiology

## 2014-04-18 ENCOUNTER — Encounter: Payer: Self-pay | Admitting: Cardiology

## 2014-04-18 VITALS — BP 136/78 | HR 87 | Ht 71.5 in | Wt 341.0 lb

## 2014-04-18 DIAGNOSIS — N184 Chronic kidney disease, stage 4 (severe): Secondary | ICD-10-CM

## 2014-04-18 DIAGNOSIS — G4733 Obstructive sleep apnea (adult) (pediatric): Secondary | ICD-10-CM | POA: Diagnosis not present

## 2014-04-18 DIAGNOSIS — I25810 Atherosclerosis of coronary artery bypass graft(s) without angina pectoris: Secondary | ICD-10-CM | POA: Diagnosis not present

## 2014-04-18 DIAGNOSIS — J449 Chronic obstructive pulmonary disease, unspecified: Secondary | ICD-10-CM

## 2014-04-18 NOTE — Progress Notes (Signed)
Shane Watson. [address] [place], [place]  [address] Phone: [phone] Fax:  [phone]  Date:  04/18/2014   ID:  Shane Watson, DOB [birth year], MRN [id number]  PCP:  Shirline Frees, MD   History of Present Illness: Shane Watson is a 68 y.o. male with coronary artery disease with percutaneous intervention DES x2 to the mid and proximal LAD 12/18/10 here for followup.  Recent cellulitis. Brief hospital stay, urgent care followup, injection and antibiotic orally. Doing much better.  Saw Dr. [name] for chronic bronchitis. Checked oxygen and prescribed for a while. Now only wears when exerts himself. OSA, CPAP.   Wears compression hose for edema.   Denies any chest pain, syncope, fevers other than associated fevers with cellulitis recently.  Wt Readings from Last 3 Encounters:  04/18/14 341 lb (154.677 kg)  02/23/14 354 lb 6.4 oz (160.755 kg)  01/15/14 345 lb (156.491 kg)     Past Medical History  Diagnosis Date  . Allergy   . Anemia   . Neuropathy   . COPD (chronic obstructive pulmonary disease)   . Hyperthyroidism   . Hyperlipidemia   . Vertigo     Past Surgical History  Procedure Laterality Date  . Cholecystectomy    . 2 heart stents    . Right knee surgery      "scraped it out"    Current Outpatient Prescriptions  Medication Sig Dispense Refill  . acetaminophen (TYLENOL) 325 MG tablet Take 325 mg by mouth every 4 (four) hours as needed for moderate pain. Per bottle as needed for pain      . albuterol (PROVENTIL HFA;VENTOLIN HFA) 108 (90 BASE) MCG/ACT inhaler Inhale 2 puffs into the lungs every 4 (four) hours as needed for wheezing or shortness of breath (((PLAN A))).       Marland Kitchen albuterol (PROVENTIL) (5 MG/ML) 0.5% nebulizer solution Take 2.5 mg by nebulization every 4 (four) hours as needed for wheezing or shortness of breath (((PLAN B))).       Marland Kitchen aspirin 81 MG tablet Take 81 mg by mouth daily.      Marland Kitchen atorvastatin (LIPITOR) 40 MG tablet Take 40 mg by  mouth at bedtime.       . budesonide-formoterol (SYMBICORT) 160-4.5 MCG/ACT inhaler Take 2 puffs first thing in am and then another 2 puffs about 12 hours later.  3 Inhaler  3  . cetirizine (ZYRTEC) 10 MG tablet Take 10 mg by mouth every morning.       . colesevelam (WELCHOL) 625 MG tablet Take 625 mg by mouth daily with breakfast.       . doxazosin (CARDURA) 8 MG tablet Take 1 tablet by mouth daily.      . famotidine (PEPCID) 20 MG tablet TAKE 1 TABLET BY MOUTH AT BEDTIME  30 tablet  5  . furosemide (LASIX) 20 MG tablet Take 40 mg by mouth daily.       Marland Kitchen gabapentin (NEURONTIN) 100 MG capsule 3 capsules by mouth twice daily      . Guaifenesin (MUCINEX MAXIMUM STRENGTH) 1200 MG TB12 Take 1 tablet by mouth 2 (two) times daily.      Marland Kitchen ketoconazole (NIZORAL) 2 % cream       . losartan (COZAAR) 50 MG tablet       . Magnesium 400 MG CAPS Take 1 capsule by mouth daily.      . meclizine (ANTIVERT) 12.5 MG tablet 2 tabs by mouth every morning      .  Multiple Vitamin (MULTIVITAMIN WITH MINERALS) TABS tablet Take 1 tablet by mouth at bedtime.       . nitroGLYCERIN (NITROSTAT) 0.4 MG SL tablet Place 1 tablet (0.4 mg total) under the tongue every 5 (five) minutes as needed for chest pain.  60 tablet  3  . OVER THE COUNTER MEDICATION Beclomethasone Cream: apply at bedtime as needed for rash      . pantoprazole (PROTONIX) 40 MG tablet Take 40 mg by mouth daily.      . potassium chloride SA (K-DUR,KLOR-CON) 20 MEQ tablet Take 20 mEq by mouth daily.      Marland Kitchen Propylene Glycol (SYSTANE BALANCE) 0.6 % SOLN Apply to eye.      . Saline (AYR SALINE NASAL DROPS) 0.65 % (SOLN) SOLN Use as needed for nasal congestion      . simethicone (MYLICON) 80 MG chewable tablet Chew 80 mg by mouth once as needed. Per box as needed for gas      . Tiotropium Bromide Monohydrate (SPIRIVA RESPIMAT) 2.5 MCG/ACT AERS Inhale 2 puffs into the lungs every morning.  3 Inhaler  3  . tiZANidine (ZANAFLEX) 4 MG tablet Take 4 mg by mouth 2 (two)  times daily.       Marland Kitchen triamcinolone cream (KENALOG) 0.1 %        No current facility-administered medications for this visit.    Allergies:    Allergies  Allergen Reactions  . Sulfa Antibiotics Hives  . Aspirin Swelling    Takes aspirin.  Marland Kitchen Penicillins Hives    Social History:  The patient  reports that he quit smoking about 26 years ago. His smoking use included Cigarettes. He has a 22.5 pack-year smoking history. He has never used smokeless tobacco. He reports that he does not drink alcohol or use illicit drugs.   ROS:  Please see the history of present illness.   PHYSICAL EXAM: VS:  BP 136/78  Pulse 87  Ht 5' 11.5" (1.816 m)  Wt 341 lb (154.677 kg)  BMI 46.90 kg/m2 Well nourished, well developed, in no acute distress HEENT: normal thick neck Neck: no JVD Cardiac:  normal S1, S2; RRR; no murmur Lungs:  clear to auscultation bilaterally, no wheezing, rhonchi or rales Abd: soft, nontender, no hepatomegalyMorbidly obese Ext: no edema Skin: warm and dry Neuro: no focal abnormalities noted  EKG:   10/11/13-normal sinus rhythm, 66, no other changes.   Labs: LDL 2013 was 54. Repeating with Dr. Kenton Kingfisher  ASSESSMENT AND PLAN:  1. Coronary artery disease-prior percutaneous intervention. Stable no angina. 2. Hypertension-well controlled today. Continue current medications.  Monitor. I'm also fine with him off of the amlodipine. His blood pressure seems to be better. 3. Morbid obesity-strongly encourage weight loss. 4. COPD - Dr. [name]. O2.  5. OSA - CPAP with O2 6. Hyperlipidemia - Lipitor, LDL goal 70. At goal previously. 7. Chronic Kidney disease stage 3-creatinine 1.5. Previously thought to be stage IV in the hospital setting. 8. 6 month followup  Signed, Candee Furbish, MD [facility]  04/18/2014 2:28 PM

## 2014-04-18 NOTE — Patient Instructions (Signed)
The current medical regimen is effective;  continue present plan and medications.  Follow up in 6 months with Dr. Skains.  You will receive a letter in the mail 2 months before you are due.  Please call us when you receive this letter to schedule your follow up appointment.  

## 2014-04-22 ENCOUNTER — Other Ambulatory Visit: Payer: Self-pay | Admitting: Cardiology

## 2014-05-03 DIAGNOSIS — E291 Testicular hypofunction: Secondary | ICD-10-CM | POA: Diagnosis not present

## 2014-05-08 ENCOUNTER — Ambulatory Visit (INDEPENDENT_AMBULATORY_CARE_PROVIDER_SITE_OTHER): Payer: Medicare Other | Admitting: Internal Medicine

## 2014-05-08 ENCOUNTER — Encounter: Payer: Self-pay | Admitting: Internal Medicine

## 2014-05-08 VITALS — BP 124/74 | HR 70 | Temp 97.9°F | Ht 71.0 in | Wt 345.0 lb

## 2014-05-08 DIAGNOSIS — I25810 Atherosclerosis of coronary artery bypass graft(s) without angina pectoris: Secondary | ICD-10-CM | POA: Diagnosis not present

## 2014-05-08 DIAGNOSIS — J9611 Chronic respiratory failure with hypoxia: Secondary | ICD-10-CM | POA: Diagnosis not present

## 2014-05-08 DIAGNOSIS — J9612 Chronic respiratory failure with hypercapnia: Secondary | ICD-10-CM | POA: Insufficient documentation

## 2014-05-08 DIAGNOSIS — J449 Chronic obstructive pulmonary disease, unspecified: Secondary | ICD-10-CM

## 2014-05-08 DIAGNOSIS — R918 Other nonspecific abnormal finding of lung field: Secondary | ICD-10-CM

## 2014-05-08 NOTE — Assessment & Plan Note (Signed)
rx 02 2lpm with cpap and with exertion, reviewed

## 2014-05-08 NOTE — Assessment & Plan Note (Signed)
.  CT chest 10/20/13 > in tickle for recall 05/03/14   CT chest ordered

## 2014-05-08 NOTE — Patient Instructions (Addendum)
Please see patient coordinator before you leave today  to schedule CT chest no contrast and I will call you with results  Please schedule a follow up visit in 3 months but call sooner if needed to see Tammy NP for new med calendar

## 2014-05-08 NOTE — Assessment & Plan Note (Signed)
-   started breo 05/02/13 > d/c 06/06/13 and replaced with symbicort   - PFT's 06/06/13  FEV1 1.59 (47%) ratio 62 and 13% better despite  breo am ov test, DLC0 82 corrects to 113% - med calendar .08/25/2013  - 01/15/2014 p extensive coaching HFA effectiveness =    90%   Adequate control on present  > no change in rx needed    Each maintenance medication was reviewed in detail including most importantly the difference between maintenance and as needed and under what circumstances the prns are to be used. This was done in the context of a medication calendar review which provided the patient with a user-friendly unambiguous mechanism for medication administration and reconciliation and provides an action plan for all active problems. It is critical that this be shown to every doctor  for modification during the office visit if necessary so the patient can use it as a working document.

## 2014-05-08 NOTE — Progress Notes (Signed)
Subjective:    Patient ID: Shane Watson, male    DOB: [birth year]   MRN: [id number]    Brief patient profile:  43 yowm  Quit smoking 1987 at wt = 240   Referred 05/02/2013 by [name] for low 02. 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  Uses alb seems to help some with assoc subj wheeze rec Breo one puff daily automatically to see if breathing improves Please see patient coordinator before you leave today  to schedule overnight 02 sat on cpap> rx 2lpm     05/08/2014 f/u ov/Shane Watson re: GOLD II copd/ morbid obesity / keeping up with med calendar better/ on spriva/symbicort  Chief Complaint  Patient presents with  . Follow-up    Pt states that his breathing is doing well. No new co's today.  Using rescue inhaler about once per month and neb about every 2 wks.      Not limited by breathing from desired activities  But by back, sev blocks on 2lpm sats low 90s by self monitor  No obvious day to day or daytime variabilty or assoc cough cp or chest tightness, subjective wheeze overt sinus or hb symptoms. No unusual exp hx or h/o childhood pna/ asthma or knowledge of premature birth.  Sleeping ok without nocturnal  or early am exacerbation  of respiratory  c/o's or need for noct saba. Also denies any obvious fluctuation of symptoms with weather or environmental changes or other aggravating or alleviating factors except as outlined above   Current Medications, Allergies, Complete Past Medical History, Past Surgical History, Family History, and  Social History were reviewed in [employer] record.  ROS  The following are not active complaints unless bolded sore throat, dysphagia, dental problems, itching, sneezing,  nasal congestion or excess/ purulent secretions, ear ache,   fever, chills, sweats, unintended wt loss, pleuritic or exertional cp, hemoptysis,  orthopnea pnd or leg swelling, presyncope, palpitations, heartburn, abdominal pain, anorexia, nausea, vomiting, diarrhea  or change in bowel or urinary habits, change in stools or urine, dysuria,hematuria,  rash, arthralgias, visual complaints, headache, numbness weakness or ataxia or problems with walking or coordination,  change in mood/affect or memory.                    Objective:   Physical Exam  08/10/2013      352 > 350 08/25/2013 > 10/17/2013 351 > 01/15/2014  345 > 05/08/2014 345    amb massively obese wm with sats 93%  HEENT: nl dentition, turbinates, and orophanx. Nl external ear canals without cough reflex   NECK :  without JVD/Nodes/TM/ nl carotid upstrokes bilaterally   LUNGS: no acc muscle use, clear to A and P bilaterally with distant bs, no wheeze    CV:  RRR  no s3 or murmur or increase in P2, wearing TEDS with bilateral 2+ pitting edema with chronic venous stasis changes   ABD:  Massively obese soft and nontender with nl excursion  in the supine position. No bruits or organomegaly, bowel sounds nl  MS:  warm without deformities, calf tenderness, cyanosis or clubbing  SKIN: warm and dry without lesions        08/07/13 cxr 1. No acute findings.  2. Right upper lobe nodule described on 10/12/2012 is not readily  appreciated         Assessment & Plan:

## 2014-05-09 DIAGNOSIS — N400 Enlarged prostate without lower urinary tract symptoms: Secondary | ICD-10-CM | POA: Diagnosis not present

## 2014-05-09 DIAGNOSIS — N5201 Erectile dysfunction due to arterial insufficiency: Secondary | ICD-10-CM | POA: Diagnosis not present

## 2014-05-09 DIAGNOSIS — E291 Testicular hypofunction: Secondary | ICD-10-CM | POA: Diagnosis not present

## 2014-05-11 ENCOUNTER — Ambulatory Visit (INDEPENDENT_AMBULATORY_CARE_PROVIDER_SITE_OTHER)
Admission: RE | Admit: 2014-05-11 | Discharge: 2014-05-11 | Disposition: A | Discharge status: Home / Self Care | Payer: Medicare Other | Source: Ambulatory Visit | Attending: Internal Medicine | Admitting: Internal Medicine

## 2014-05-11 DIAGNOSIS — R918 Other nonspecific abnormal finding of lung field: Secondary | ICD-10-CM

## 2014-05-13 ENCOUNTER — Encounter: Payer: Self-pay | Admitting: Internal Medicine

## 2014-05-14 NOTE — Progress Notes (Signed)
Quick Note:  Spoke with pt and notified of results per Dr. [name]. Pt verbalized understanding and denied any questions.  ______ 

## 2014-05-17 ENCOUNTER — Ambulatory Visit (HOSPITAL_COMMUNITY)
Admission: RE | Admit: 2014-05-17 | Discharge: 2014-05-17 | Disposition: A | Discharge status: Home / Self Care | Payer: Medicare Other | Source: Ambulatory Visit | Attending: Family Medicine | Admitting: Family Medicine

## 2014-05-17 ENCOUNTER — Other Ambulatory Visit (HOSPITAL_COMMUNITY): Payer: Self-pay | Admitting: Family Medicine

## 2014-05-17 DIAGNOSIS — M7989 Other specified soft tissue disorders: Secondary | ICD-10-CM | POA: Diagnosis not present

## 2014-05-17 DIAGNOSIS — M79661 Pain in right lower leg: Secondary | ICD-10-CM

## 2014-05-17 DIAGNOSIS — I8310 Varicose veins of unspecified lower extremity with inflammation: Secondary | ICD-10-CM | POA: Diagnosis not present

## 2014-05-17 DIAGNOSIS — M79609 Pain in unspecified limb: Secondary | ICD-10-CM | POA: Diagnosis not present

## 2014-05-17 DIAGNOSIS — L03115 Cellulitis of right lower limb: Secondary | ICD-10-CM | POA: Diagnosis not present

## 2014-05-17 NOTE — Progress Notes (Signed)
*  Preliminary Results* Right lower extremity venous duplex completed. Right lower extremity is negative for deep vein thrombosis. There is no evidence of right Baker's cyst.  05/17/2014 3:52 PM  Maudry Mayhew, RVT, RDCS, RDMS

## 2014-05-18 DIAGNOSIS — K219 Gastro-esophageal reflux disease without esophagitis: Secondary | ICD-10-CM | POA: Diagnosis not present

## 2014-05-18 DIAGNOSIS — G4730 Sleep apnea, unspecified: Secondary | ICD-10-CM | POA: Diagnosis not present

## 2014-05-18 DIAGNOSIS — N400 Enlarged prostate without lower urinary tract symptoms: Secondary | ICD-10-CM | POA: Diagnosis not present

## 2014-05-18 DIAGNOSIS — G609 Hereditary and idiopathic neuropathy, unspecified: Secondary | ICD-10-CM | POA: Diagnosis not present

## 2014-05-18 DIAGNOSIS — E291 Testicular hypofunction: Secondary | ICD-10-CM | POA: Diagnosis not present

## 2014-05-18 DIAGNOSIS — I10 Essential (primary) hypertension: Secondary | ICD-10-CM | POA: Diagnosis not present

## 2014-05-18 DIAGNOSIS — I872 Venous insufficiency (chronic) (peripheral): Secondary | ICD-10-CM | POA: Diagnosis not present

## 2014-05-18 DIAGNOSIS — L03115 Cellulitis of right lower limb: Secondary | ICD-10-CM | POA: Diagnosis not present

## 2014-05-24 DIAGNOSIS — E291 Testicular hypofunction: Secondary | ICD-10-CM | POA: Diagnosis not present

## 2014-06-06 DIAGNOSIS — J0110 Acute frontal sinusitis, unspecified: Secondary | ICD-10-CM | POA: Diagnosis not present

## 2014-06-16 ENCOUNTER — Encounter: Payer: Self-pay | Admitting: *Deleted

## 2014-06-19 DIAGNOSIS — J209 Acute bronchitis, unspecified: Secondary | ICD-10-CM | POA: Diagnosis not present

## 2014-06-19 DIAGNOSIS — J449 Chronic obstructive pulmonary disease, unspecified: Secondary | ICD-10-CM | POA: Diagnosis not present

## 2014-06-25 DIAGNOSIS — E291 Testicular hypofunction: Secondary | ICD-10-CM | POA: Diagnosis not present

## 2014-06-25 DIAGNOSIS — J449 Chronic obstructive pulmonary disease, unspecified: Secondary | ICD-10-CM | POA: Diagnosis not present

## 2014-06-25 DIAGNOSIS — N183 Chronic kidney disease, stage 3 (moderate): Secondary | ICD-10-CM | POA: Diagnosis not present

## 2014-06-26 ENCOUNTER — Other Ambulatory Visit: Payer: Self-pay | Admitting: *Deleted

## 2014-06-26 MED ORDER — POTASSIUM CHLORIDE CRYS ER 20 MEQ PO TBCR: 20.0000 meq | EXTENDED_RELEASE_TABLET | Freq: Every day | ORAL | Status: DC

## 2014-06-27 ENCOUNTER — Other Ambulatory Visit: Payer: Self-pay | Admitting: *Deleted

## 2014-06-27 MED ORDER — PANTOPRAZOLE SODIUM 40 MG PO TBEC: 40.0000 mg | DELAYED_RELEASE_TABLET | Freq: Every day | ORAL | Status: DC

## 2014-06-27 MED ORDER — FAMOTIDINE 20 MG PO TABS
20.0000 mg | ORAL_TABLET | Freq: Every day | ORAL | Status: DC
Start: 2014-06-27 — End: 2014-10-04

## 2014-07-05 ENCOUNTER — Telehealth: Payer: Self-pay | Admitting: Cardiology

## 2014-07-05 MED ORDER — LOSARTAN POTASSIUM 50 MG PO TABS: 50.0000 mg | ORAL_TABLET | Freq: Every day | ORAL | Status: DC

## 2014-07-05 MED ORDER — COLESEVELAM HCL 625 MG PO TABS: 625.0000 mg | ORAL_TABLET | Freq: Every day | ORAL | Status: DC

## 2014-07-05 MED ORDER — DOXAZOSIN MESYLATE 8 MG PO TABS: 8.0000 mg | ORAL_TABLET | Freq: Every day | ORAL | Status: DC

## 2014-07-05 MED ORDER — ATORVASTATIN CALCIUM 40 MG PO TABS
40.0000 mg | ORAL_TABLET | Freq: Every day | ORAL | Status: DC
Start: 2014-07-05 — End: 2015-01-15

## 2014-07-05 MED ORDER — FUROSEMIDE 20 MG PO TABS: 40.0000 mg | ORAL_TABLET | Freq: Every day | ORAL | Status: DC

## 2014-07-05 NOTE — Telephone Encounter (Signed)
RXs sent in electronically to [facility]

## 2014-07-05 NOTE — Telephone Encounter (Signed)
New message        Pt would like his prescriptions to be sent to [facility]   pt is not currently set up with [facility]   [phone] is the fax number for [facility]

## 2014-07-20 ENCOUNTER — Other Ambulatory Visit: Payer: Self-pay | Admitting: Cardiology

## 2014-07-24 DIAGNOSIS — L03115 Cellulitis of right lower limb: Secondary | ICD-10-CM | POA: Diagnosis not present

## 2014-07-24 DIAGNOSIS — E291 Testicular hypofunction: Secondary | ICD-10-CM | POA: Diagnosis not present

## 2014-07-24 DIAGNOSIS — R0989 Other specified symptoms and signs involving the circulatory and respiratory systems: Secondary | ICD-10-CM | POA: Diagnosis not present

## 2014-07-26 ENCOUNTER — Other Ambulatory Visit: Payer: Self-pay | Admitting: Family Medicine

## 2014-07-26 DIAGNOSIS — R0989 Other specified symptoms and signs involving the circulatory and respiratory systems: Secondary | ICD-10-CM

## 2014-07-27 DIAGNOSIS — L03115 Cellulitis of right lower limb: Secondary | ICD-10-CM | POA: Diagnosis not present

## 2014-07-31 ENCOUNTER — Other Ambulatory Visit: Payer: Medicare Other

## 2014-08-03 ENCOUNTER — Ambulatory Visit
Admission: RE | Admit: 2014-08-03 | Discharge: 2014-08-03 | Disposition: A | Discharge status: Home / Self Care | Payer: Medicare Other | Source: Ambulatory Visit | Attending: Family Medicine | Admitting: Family Medicine

## 2014-08-03 DIAGNOSIS — R0989 Other specified symptoms and signs involving the circulatory and respiratory systems: Secondary | ICD-10-CM

## 2014-08-03 DIAGNOSIS — I739 Peripheral vascular disease, unspecified: Secondary | ICD-10-CM | POA: Diagnosis not present

## 2014-08-06 ENCOUNTER — Encounter: Payer: Self-pay | Admitting: Internal Medicine

## 2014-08-06 ENCOUNTER — Ambulatory Visit (INDEPENDENT_AMBULATORY_CARE_PROVIDER_SITE_OTHER): Payer: Medicare Other | Admitting: Internal Medicine

## 2014-08-06 VITALS — BP 140/62 | HR 76 | Ht 71.0 in | Wt 354.0 lb

## 2014-08-06 DIAGNOSIS — J449 Chronic obstructive pulmonary disease, unspecified: Secondary | ICD-10-CM | POA: Diagnosis not present

## 2014-08-06 DIAGNOSIS — J9611 Chronic respiratory failure with hypoxia: Secondary | ICD-10-CM | POA: Diagnosis not present

## 2014-08-06 MED ORDER — BUDESONIDE-FORMOTEROL FUMARATE 160-4.5 MCG/ACT IN AERO: INHALATION_SPRAY | RESPIRATORY_TRACT | Status: DC

## 2014-08-06 NOTE — Patient Instructions (Signed)
See calendar for specific medication instructions and bring it back for each and every office visit for every healthcare provider you see.  Without it,  you may not receive the best quality medical care that we feel you deserve. ° °You will note that the calendar groups together  your maintenance  medications that are timed at particular times of the day.  Think of this as your checklist for what your doctor has instructed you to do until your next evaluation to see what benefit  there is  to staying on a consistent group of medications intended to keep you well.  The other group at the bottom is entirely up to you to use as you see fit  for specific symptoms that may arise between visits that require you to treat them on an as needed basis.  Think of this as your action plan or "what if" list.  ° °Separating the top medications from the bottom group is fundamental to providing you adequate care going forward.   ° ° °Please schedule a follow up visit in 6 months but call sooner if needed  ° ° ° ° ° ° ° °

## 2014-08-06 NOTE — Progress Notes (Signed)
Subjective:    Patient ID: Shane Watson, male    DOB: [birth year]   MRN: [id number]    Brief patient profile:  13 yowm  Quit smoking 1987 at wt = 240   Referred 05/02/2013 by [name] for low 02. 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  Uses alb seems to help some with assoc subj wheeze rec Breo one puff daily automatically to see if breathing improves Please see patient coordinator before you leave today  to schedule overnight 02 sat on cpap> rx 2lpm     08/06/2014 f/u ov/Caid Radin [id number] II copd/ morbid obesity/ keeping up with calendar and maint on symb/spiriva  Chief Complaint  Patient presents with  . Follow-up    Pt states that his breathing has been overall doing well. He had some minimal wheezing this am and used neb and this helped. He is using albuterol inhaler once per wk on average and rarely needs neb.   Not limited by breathing from desired activities      No obvious day to day or daytime variabilty or assoc cough or  cp or chest tightness, subjective wheeze overt sinus or hb symptoms. No unusual exp hx or h/o childhood pna/ asthma or knowledge of premature birth.  Sleeping ok without nocturnal  or early am exacerbation  of respiratory  c/o's or need for noct saba. Also denies any obvious fluctuation of symptoms with weather or environmental changes or other aggravating or alleviating factors except as outlined above   Current Medications, Allergies, Complete Past Medical History, Past Surgical History, Family History, and  Social History were reviewed in [employer] record.  ROS  The following are not active complaints unless bolded sore throat, dysphagia, dental problems, itching, sneezing,  nasal congestion or excess/ purulent secretions, ear ache,   fever, chills, sweats, unintended wt loss, pleuritic or exertional cp, hemoptysis,  orthopnea pnd or leg swelling, presyncope, palpitations, heartburn, abdominal pain, anorexia, nausea, vomiting, diarrhea  or change in bowel or urinary habits, change in stools or urine, dysuria,hematuria,  rash, arthralgias, visual complaints, headache, numbness weakness or ataxia or problems with walking or coordination,  change in mood/affect or memory.                    Objective:   Physical Exam  08/10/2013      352 > 350 08/25/2013 > 10/17/2013 351 > 01/15/2014  345 > 05/08/2014 345 > 08/06/2014  354    amb massively obese wm with sats 93%  HEENT: nl dentition, turbinates, and orophanx. Nl external ear canals without cough reflex   NECK :  without JVD/Nodes/TM/ nl carotid upstrokes bilaterally   LUNGS: no acc muscle use, clear to A and P bilaterally with distant bs, no wheeze    CV:  RRR  no s3 or murmur or increase in P2, wearing TEDS with bilateral 2+ pitting edema with chronic venous stasis changes   ABD:  Massively obese soft and nontender with  nl excursion in the supine position. No bruits or organomegaly, bowel sounds nl  MS:  warm without deformities, calf tenderness, cyanosis or clubbing  SKIN: warm and dry without lesions        08/07/13 cxr 1. No acute findings.  2. Right upper lobe nodule described on 10/12/2012 is not readily  appreciated         Assessment & Plan:

## 2014-08-08 ENCOUNTER — Encounter: Payer: Self-pay | Admitting: Internal Medicine

## 2014-08-08 NOTE — Assessment & Plan Note (Signed)
-   started breo 05/02/13 > d/c 06/06/13 and replaced with symbicort   - PFT's 06/06/13  FEV1 1.59 (47%) ratio 62 and 13% better despite  breo am ov test, DLC0 82 corrects to 113% - med calendar .08/25/2013  - 01/15/2014 p extensive coaching HFA effectiveness =    90%    I had an extended discussion with the patient reviewing all relevant studies completed to date and  lasting 15 to 20 minutes of a 25 minute visit on the following ongoing concerns:  1) Moderately severe copd which would be much better tolerated at lower wt/ discussed in detail 2) Each maintenance medication was reviewed in detail including most importantly the difference between maintenance and as needed and under what circumstances the prns are to be used. This was done in the context of a medication calendar review which provided the patient with a user-friendly unambiguous mechanism for medication administration and reconciliation and provides an action plan for all active problems. It is critical that this be shown to every doctor  for modification during the office visit if necessary so the patient can use it as a working document.

## 2014-08-08 NOTE — Assessment & Plan Note (Signed)
On 02 at hs and with exertion   Adequate control on present rx, reviewed > no change in rx needed

## 2014-08-14 DIAGNOSIS — E291 Testicular hypofunction: Secondary | ICD-10-CM | POA: Diagnosis not present

## 2014-09-04 DIAGNOSIS — E291 Testicular hypofunction: Secondary | ICD-10-CM | POA: Diagnosis not present

## 2014-09-10 DIAGNOSIS — J441 Chronic obstructive pulmonary disease with (acute) exacerbation: Secondary | ICD-10-CM | POA: Diagnosis not present

## 2014-09-10 DIAGNOSIS — S8012XA Contusion of left lower leg, initial encounter: Secondary | ICD-10-CM | POA: Diagnosis not present

## 2014-09-14 DIAGNOSIS — L03116 Cellulitis of left lower limb: Secondary | ICD-10-CM | POA: Diagnosis not present

## 2014-09-22 DIAGNOSIS — R509 Fever, unspecified: Secondary | ICD-10-CM | POA: Diagnosis not present

## 2014-09-22 DIAGNOSIS — B349 Viral infection, unspecified: Secondary | ICD-10-CM | POA: Diagnosis not present

## 2014-09-24 DIAGNOSIS — J449 Chronic obstructive pulmonary disease, unspecified: Secondary | ICD-10-CM | POA: Diagnosis not present

## 2014-09-24 DIAGNOSIS — E291 Testicular hypofunction: Secondary | ICD-10-CM | POA: Diagnosis not present

## 2014-09-24 DIAGNOSIS — I872 Venous insufficiency (chronic) (peripheral): Secondary | ICD-10-CM | POA: Diagnosis not present

## 2014-10-04 ENCOUNTER — Other Ambulatory Visit: Payer: Self-pay | Admitting: Cardiology

## 2014-10-04 ENCOUNTER — Other Ambulatory Visit: Payer: Self-pay | Admitting: Internal Medicine

## 2014-10-09 ENCOUNTER — Encounter: Payer: Self-pay | Admitting: Cardiology

## 2014-10-09 ENCOUNTER — Ambulatory Visit (INDEPENDENT_AMBULATORY_CARE_PROVIDER_SITE_OTHER): Payer: Medicare Other | Admitting: Cardiology

## 2014-10-09 VITALS — BP 132/60 | HR 88 | Ht 71.0 in | Wt 353.0 lb

## 2014-10-09 DIAGNOSIS — N183 Chronic kidney disease, stage 3 (moderate): Secondary | ICD-10-CM

## 2014-10-09 DIAGNOSIS — G4733 Obstructive sleep apnea (adult) (pediatric): Secondary | ICD-10-CM

## 2014-10-09 DIAGNOSIS — I2510 Atherosclerotic heart disease of native coronary artery without angina pectoris: Secondary | ICD-10-CM | POA: Insufficient documentation

## 2014-10-09 DIAGNOSIS — N189 Chronic kidney disease, unspecified: Secondary | ICD-10-CM | POA: Insufficient documentation

## 2014-10-09 DIAGNOSIS — I2583 Coronary atherosclerosis due to lipid rich plaque: Principal | ICD-10-CM

## 2014-10-09 NOTE — Patient Instructions (Signed)
The current medical regimen is effective;  continue present plan and medications.  Follow up in 6 months with Dr. Skains.  You will receive a letter in the mail 2 months before you are due.  Please call us when you receive this letter to schedule your follow up appointment.  Thank you for choosing  [facility]!!     

## 2014-10-09 NOTE — Progress Notes (Signed)
[place]. [address] [place], [place]  [address] Phone: [phone] Fax:  [phone]  Date:  10/09/2014   ID:  Shane Watson, DOB [birth year], MRN [id number]  PCP:  Shirline Frees, MD   History of Present Illness: Shane Watson is a 69 y.o. male with coronary artery disease with percutaneous intervention DES x2 to the mid and proximal LAD 12/18/10 here for followup.  Recent cellulitis. Brief hospital stay, urgent care followup, injection and antibiotic orally. Doing much better.  Saw Dr. [name] for chronic bronchitis. Checked oxygen and prescribed for a while. Now only wears when exerts himself. OSA, CPAP.   Wears compression hose for edema.   Denies syncope, fevers other than associated fevers with cellulitis recently.  Feels some burning in chest in the middle of the night that resolves with water. Takes famotidine  Took abx for LE. Rocephin. Keflex. Lower extremity cellulitis.  Wt Readings from Last 3 Encounters:  10/09/14 353 lb (160.12 kg)  08/06/14 354 lb (160.573 kg)  05/08/14 345 lb (156.491 kg)     Past Medical History  Diagnosis Date  . Allergy   . Anemia   . Neuropathy   . COPD (chronic obstructive pulmonary disease)   . Hyperthyroidism   . Hyperlipidemia   . Vertigo     Past Surgical History  Procedure Laterality Date  . Cholecystectomy    . 2 heart stents    . Right knee surgery      "scraped it out"    Current Outpatient Prescriptions  Medication Sig Dispense Refill  . acetaminophen (TYLENOL) 325 MG tablet Take 325 mg by mouth every 4 (four) hours as needed for moderate pain. Per bottle as needed for pain    . albuterol (PROVENTIL HFA;VENTOLIN HFA) 108 (90 BASE) MCG/ACT inhaler Inhale 2 puffs into the lungs every 4 (four) hours as needed for wheezing or shortness of breath (((PLAN A))).     Marland Kitchen albuterol (PROVENTIL) (5 MG/ML) 0.5% nebulizer solution Take 2.5 mg by nebulization every 4 (four) hours as needed for wheezing or shortness  of breath (((PLAN B))).     Marland Kitchen aspirin 81 MG tablet Take 81 mg by mouth daily.    Marland Kitchen atorvastatin (LIPITOR) 40 MG tablet Take 1 tablet (40 mg total) by mouth at bedtime. 90 tablet 1  . budesonide-formoterol (SYMBICORT) 160-4.5 MCG/ACT inhaler Take 2 puffs first thing in am and then another 2 puffs about 12 hours later. 1 Inhaler 11  . cetirizine (ZYRTEC) 10 MG tablet Take 10 mg by mouth every morning.     . colesevelam (WELCHOL) 625 MG tablet Take 1 tablet (625 mg total) by mouth daily with breakfast. 90 tablet 1  . doxazosin (CARDURA) 8 MG tablet Take 1 tablet (8 mg total) by mouth daily. 90 tablet 1  . famotidine (PEPCID) 20 MG tablet Take 1 tablet by mouth at  bedtime 90 tablet 1  . furosemide (LASIX) 20 MG tablet Take 2 tablets by mouth  daily 180 tablet 0  . gabapentin (NEURONTIN) 100 MG capsule 3 capsules by mouth twice daily    . losartan (COZAAR) 50 MG tablet Take 1 tablet (50 mg total) by mouth daily. 90 tablet 1  . meclizine (ANTIVERT) 12.5 MG tablet 2 tabs by mouth every morning    . Multiple Vitamin (MULTIVITAMIN WITH MINERALS) TABS tablet Take 1 tablet by mouth at bedtime.     . nitroGLYCERIN (NITROSTAT) 0.4 MG SL tablet Place 1 tablet (0.4  mg total) under the tongue every 5 (five) minutes as needed for chest pain. 60 tablet 3  . pantoprazole (PROTONIX) 40 MG tablet Take 1 tablet (40 mg total) by mouth daily. 90 tablet 0  . potassium chloride SA (K-DUR,KLOR-CON) 20 MEQ tablet Take 1 tablet by mouth  daily 90 tablet 0  . Tiotropium Bromide Monohydrate (SPIRIVA RESPIMAT) 2.5 MCG/ACT AERS Inhale 2 puffs into the lungs every morning. 3 Inhaler 3   No current facility-administered medications for this visit.    Allergies:    Allergies  Allergen Reactions  . Sulfa Antibiotics Hives  . Aspirin Swelling    Takes aspirin.  Marland Kitchen Penicillins Hives    Social History:  The patient  reports that he quit smoking about 27 years ago. His smoking use included Cigarettes. He has a 22.5 pack-year  smoking history. He has never used smokeless tobacco. He reports that he does not drink alcohol or use illicit drugs.   ROS:  Please see the history of present illness.   PHYSICAL EXAM: VS:  BP 132/60 mmHg  Pulse 88  Ht 5\' 11"  (1.803 m)  Wt 353 lb (160.12 kg)  BMI 49.26 kg/m2 Well nourished, well developed, in no acute distress HEENT: normal thick neck Neck: no JVD Cardiac:  normal S1, S2; RRR; no murmur Lungs:  clear to auscultation bilaterally, no wheezing, rhonchi or rales Abd: soft, nontender, no hepatomegalyMorbidly obese Ext: 1+ edema, compression hose.  Skin: warm and dry Neuro: no focal abnormalities noted  EKG:   10/11/13-normal sinus rhythm, 66, no other changes.   Labs: LDL 2013 was 54. Repeating with Dr. Kenton Kingfisher  ASSESSMENT AND PLAN:  1. Coronary artery disease-prior percutaneous intervention. Stable no angina. 2. Hypertension-well controlled today. Continue current medications.  Monitor. 3. Morbid obesity-strongly encourage weight loss. 4. COPD - Dr. [name]. O2.  5. OSA - CPAP with O2 6. Hyperlipidemia - Lipitor, LDL goal 70. At goal previously. 7. Chronic Kidney disease stage 3-creatinine 1.5. Previously thought to be stage IV in the hospital setting. 8. 6 month followup  Signed, Candee Furbish, MD [facility]  10/09/2014 2:43 PM

## 2014-10-15 DIAGNOSIS — E291 Testicular hypofunction: Secondary | ICD-10-CM | POA: Diagnosis not present

## 2014-10-16 ENCOUNTER — Other Ambulatory Visit: Payer: Self-pay | Admitting: Internal Medicine

## 2014-10-18 ENCOUNTER — Other Ambulatory Visit: Payer: Self-pay | Admitting: Internal Medicine

## 2014-10-18 DIAGNOSIS — R918 Other nonspecific abnormal finding of lung field: Secondary | ICD-10-CM

## 2014-10-19 IMAGING — CT CT CHEST W/O CM
3 of 10 series · 15 of 31 positions shown, 17 images · non-contrast
Comparison: CT CHEST W/O CM dated 10/12/2012; CT ABD/PELVIS W CM
dated 10/27/2011

CLINICAL DATA: Followup lung nodule. Chest pain, cough and
shortness of breath.

EXAM:
CT CHEST WITHOUT CONTRAST
TECHNIQUE: Multidetector CT imaging of the chest was performed following the
standard protocol without IV contrast.

[Series 602: sagittal body · sagittal · 0.86mm/px · 5 of 177 slices shown, 7 images (1 of 3)]
[im 30/177  mediastinal]
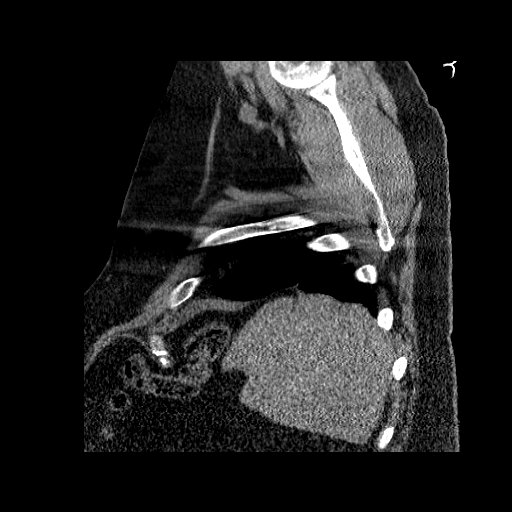
[im 30/177  lung]
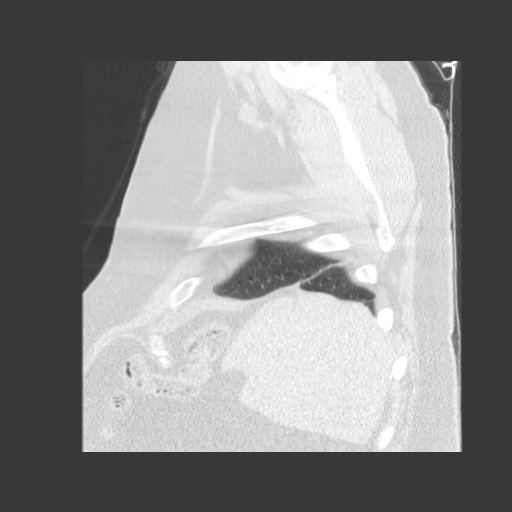
[im 59/177  lung]
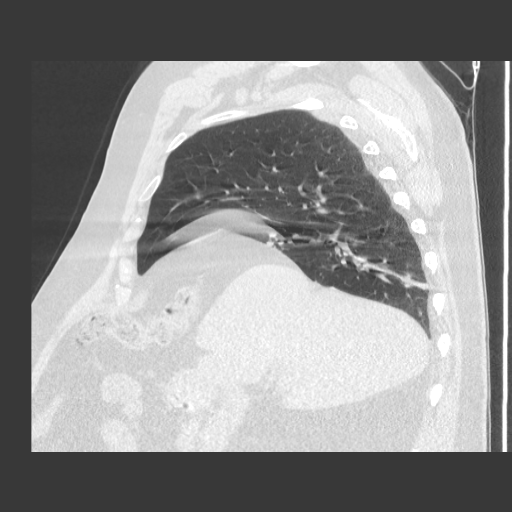
[im 89/177  lung]
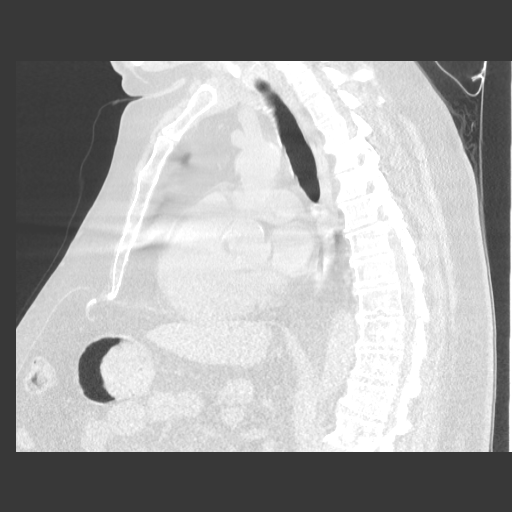
[im 118/177  lung]
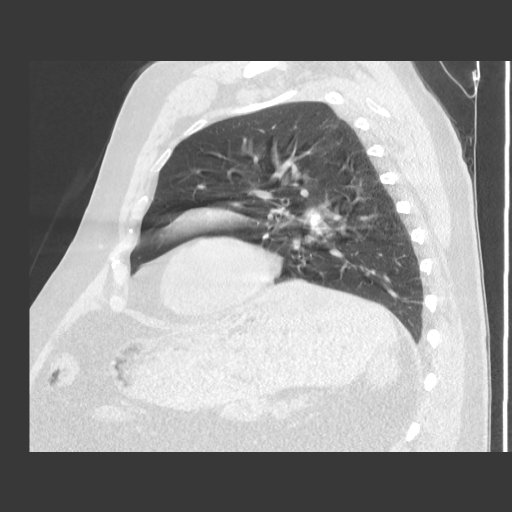
[im 147/177  mediastinal]
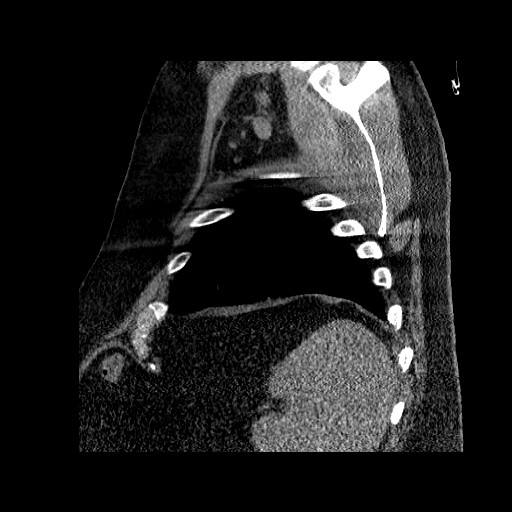
[im 147/177  lung]
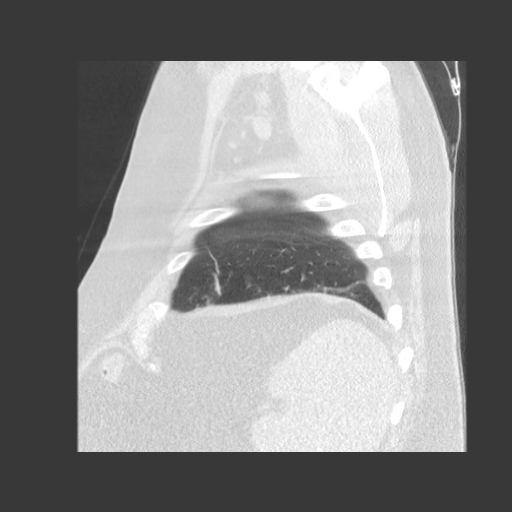

[Series 605: sagittal body · sagittal · 0.86mm/px · 5 of 177 slices shown (2 of 3)]
[im 30/177  mediastinal]
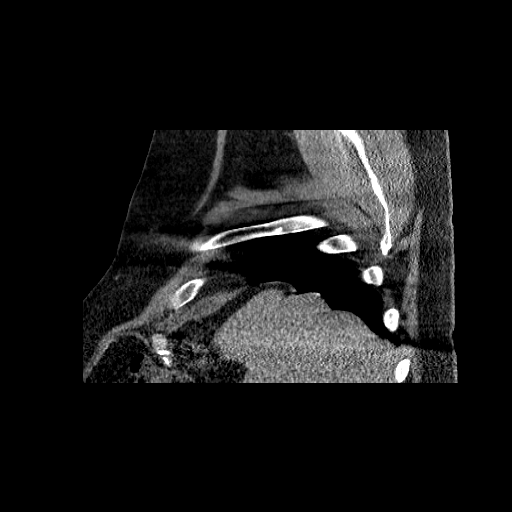
[im 59/177  mediastinal]
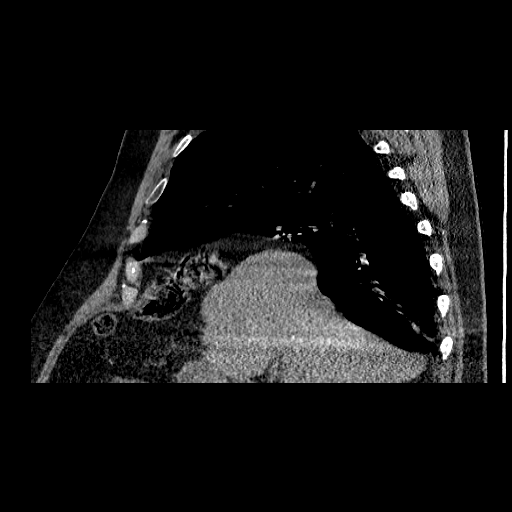
[im 89/177  mediastinal]
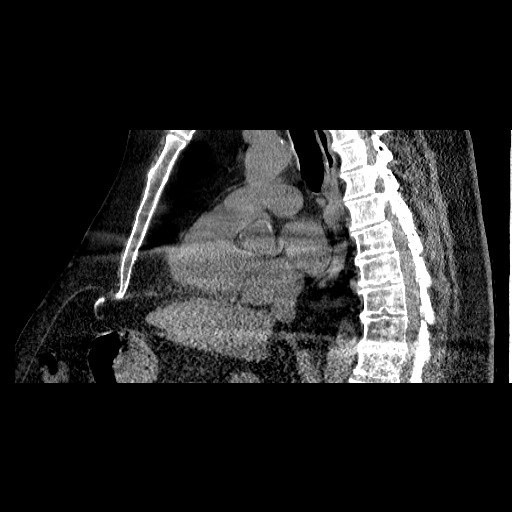
[im 118/177  mediastinal]
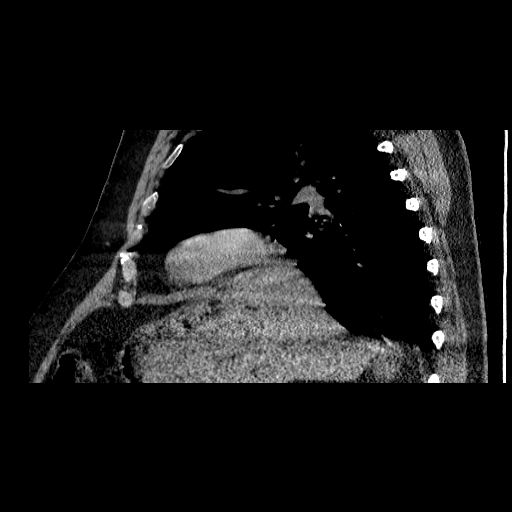
[im 147/177  mediastinal]
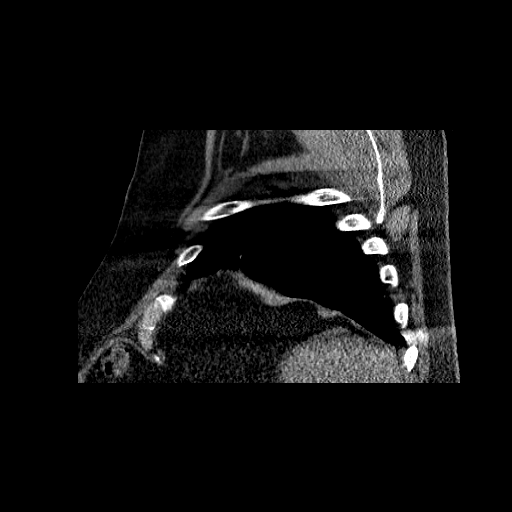

[Series 608: sagittal body · sagittal · 0.86mm/px · 5 of 177 slices shown (3 of 3)]
[im 30/177  mediastinal]
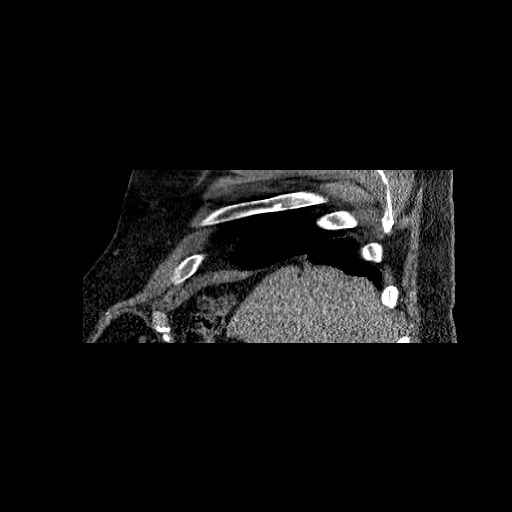
[im 59/177  mediastinal]
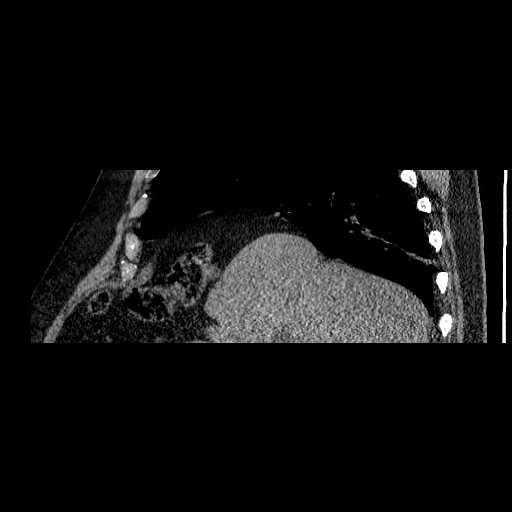
[im 89/177  mediastinal]
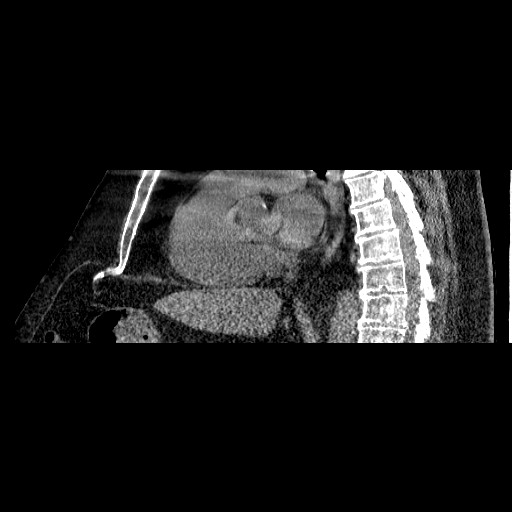
[im 118/177  mediastinal]
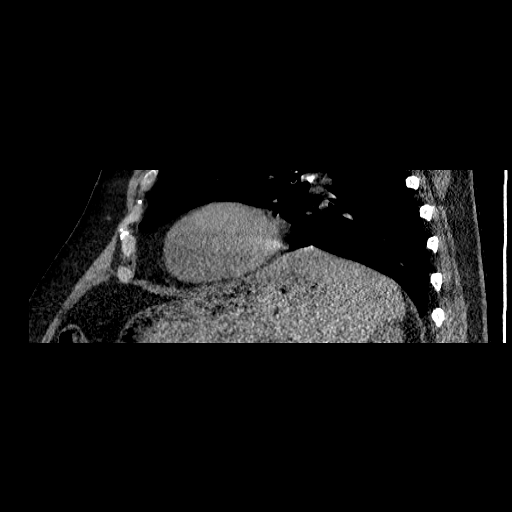
[im 147/177  mediastinal]
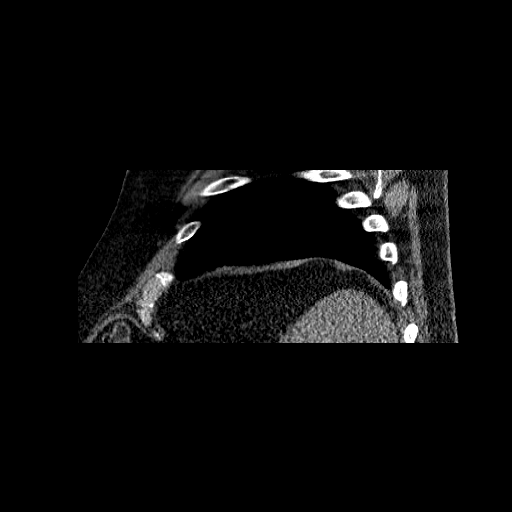

[15 of 31 positions shown; findings below may reference images not displayed]

FINDINGS: Mediastinal lymph nodes are not enlarged by CT size criteria. Hilar
regions are difficult to definitively evaluate without IV contrast
but appear grossly unremarkable. No axillary adenopathy. Coronary
artery calcification. Heart size normal. No pericardial effusion.

Image quality is degraded by motion. There are 2 new pulmonary
nodules in the right lower lobe, measuring 8 mm (series 10, image
10). Subpleural 8 mm right upper lobe nodule (series 7, images 9) is
unchanged. 4 mm left upper lobe nodule (series 7, image 18) is
unchanged. No pleural fluid. Airway is unremarkable.

Incidental imaging of the upper abdomen shows the liver to be
grossly unremarkable. Cholecystectomy. Visualized portions of the
adrenal glands, spleen, pancreas, stomach and bowel are grossly
unremarkable. No worrisome lytic or sclerotic lesions. Degenerative
changes are seen in the spine.
IMPRESSION: 1. Image quality is degraded by motion. Right lower lobe nodules
appear new and measure up to 8 mm. If the patient is at high risk
for bronchogenic carcinoma, follow-up chest CT at 3-3months is
recommended. If the patient is at low risk for bronchogenic
carcinoma, follow-up chest CT at 6-12 months is recommended. This
recommendation follows the consensus statement: Guidelines for
Management of Small Pulmonary Nodules Detected on CT Scans: A
Statement from the [HOSPITAL] as published in Radiology
7667; [DATE].
2. Upper lobe pulmonary nodules appear stable. Continued attention
on followup exams is warranted.
3. Coronary artery calcification.

## 2014-10-26 DIAGNOSIS — L03116 Cellulitis of left lower limb: Secondary | ICD-10-CM | POA: Diagnosis not present

## 2014-11-05 DIAGNOSIS — E291 Testicular hypofunction: Secondary | ICD-10-CM | POA: Diagnosis not present

## 2014-11-12 ENCOUNTER — Inpatient Hospital Stay: Admission: RE | Admit: 2014-11-12 | Payer: Medicare Other | Source: Ambulatory Visit

## 2014-11-21 ENCOUNTER — Ambulatory Visit (INDEPENDENT_AMBULATORY_CARE_PROVIDER_SITE_OTHER)
Admission: RE | Admit: 2014-11-21 | Discharge: 2014-11-21 | Disposition: A | Discharge status: Home / Self Care | Payer: Medicare Other | Source: Ambulatory Visit | Attending: Internal Medicine | Admitting: Internal Medicine

## 2014-11-21 DIAGNOSIS — R918 Other nonspecific abnormal finding of lung field: Secondary | ICD-10-CM | POA: Diagnosis not present

## 2014-11-21 NOTE — Progress Notes (Signed)
Quick Note:  Dr Roxan Hockey following ______

## 2014-11-26 DIAGNOSIS — E291 Testicular hypofunction: Secondary | ICD-10-CM | POA: Diagnosis not present

## 2014-12-10 ENCOUNTER — Other Ambulatory Visit: Payer: Self-pay | Admitting: Adult Health

## 2014-12-10 NOTE — Telephone Encounter (Signed)
spiriva will be refilled Pt needs July 2016 follow up appt.

## 2014-12-19 DIAGNOSIS — E291 Testicular hypofunction: Secondary | ICD-10-CM | POA: Diagnosis not present

## 2014-12-26 ENCOUNTER — Telehealth: Payer: Self-pay | Admitting: Internal Medicine

## 2014-12-26 NOTE — Telephone Encounter (Signed)
Pt aware PA forms left for pick up. Nothing further needed

## 2014-12-31 ENCOUNTER — Telehealth: Payer: Self-pay | Admitting: Internal Medicine

## 2014-12-31 MED ORDER — BUDESONIDE-FORMOTEROL FUMARATE 160-4.5 MCG/ACT IN AERO: INHALATION_SPRAY | RESPIRATORY_TRACT | Status: DC

## 2014-12-31 MED ORDER — TIOTROPIUM BROMIDE MONOHYDRATE 2.5 MCG/ACT IN AERS: 2.0000 | INHALATION_SPRAY | Freq: Every morning | RESPIRATORY_TRACT | Status: DC

## 2014-12-31 NOTE — Telephone Encounter (Signed)
Forms completed and placed in MW's lookat to be signed along with 3 month supply rxs

## 2015-01-01 DIAGNOSIS — E291 Testicular hypofunction: Secondary | ICD-10-CM | POA: Diagnosis not present

## 2015-01-01 DIAGNOSIS — N183 Chronic kidney disease, stage 3 (moderate): Secondary | ICD-10-CM | POA: Diagnosis not present

## 2015-01-01 DIAGNOSIS — E785 Hyperlipidemia, unspecified: Secondary | ICD-10-CM | POA: Diagnosis not present

## 2015-01-01 DIAGNOSIS — J449 Chronic obstructive pulmonary disease, unspecified: Secondary | ICD-10-CM | POA: Diagnosis not present

## 2015-01-01 DIAGNOSIS — I10 Essential (primary) hypertension: Secondary | ICD-10-CM | POA: Diagnosis not present

## 2015-01-01 DIAGNOSIS — I872 Venous insufficiency (chronic) (peripheral): Secondary | ICD-10-CM | POA: Diagnosis not present

## 2015-01-02 NOTE — Telephone Encounter (Signed)
Forms mailed  Pt aware

## 2015-01-02 NOTE — Telephone Encounter (Signed)
Did MW complete these forms and give them back to you? Thanks!

## 2015-01-07 DIAGNOSIS — E291 Testicular hypofunction: Secondary | ICD-10-CM | POA: Diagnosis not present

## 2015-01-10 ENCOUNTER — Encounter: Payer: Self-pay | Admitting: Internal Medicine

## 2015-01-11 ENCOUNTER — Other Ambulatory Visit: Payer: Self-pay | Admitting: Cardiology

## 2015-01-11 ENCOUNTER — Other Ambulatory Visit: Payer: Self-pay

## 2015-01-11 MED ORDER — POTASSIUM CHLORIDE CRYS ER 20 MEQ PO TBCR: EXTENDED_RELEASE_TABLET | ORAL | Status: DC

## 2015-01-11 MED ORDER — TIOTROPIUM BROMIDE MONOHYDRATE 2.5 MCG/ACT IN AERS: 2.0000 | INHALATION_SPRAY | Freq: Every morning | RESPIRATORY_TRACT | Status: DC

## 2015-01-14 ENCOUNTER — Other Ambulatory Visit: Payer: Self-pay

## 2015-01-15 ENCOUNTER — Other Ambulatory Visit: Payer: Self-pay | Admitting: Cardiology

## 2015-01-17 NOTE — Telephone Encounter (Signed)
Per note 6.27.16

## 2015-01-29 DIAGNOSIS — E291 Testicular hypofunction: Secondary | ICD-10-CM | POA: Diagnosis not present

## 2015-02-02 DIAGNOSIS — I10 Essential (primary) hypertension: Secondary | ICD-10-CM | POA: Diagnosis not present

## 2015-02-02 DIAGNOSIS — J441 Chronic obstructive pulmonary disease with (acute) exacerbation: Secondary | ICD-10-CM | POA: Diagnosis not present

## 2015-02-04 ENCOUNTER — Encounter: Payer: Self-pay | Admitting: Internal Medicine

## 2015-02-04 ENCOUNTER — Ambulatory Visit (INDEPENDENT_AMBULATORY_CARE_PROVIDER_SITE_OTHER)
Admission: RE | Admit: 2015-02-04 | Discharge: 2015-02-04 | Disposition: A | Discharge status: Home / Self Care | Payer: Medicare Other | Source: Ambulatory Visit | Attending: Internal Medicine | Admitting: Internal Medicine

## 2015-02-04 ENCOUNTER — Other Ambulatory Visit (INDEPENDENT_AMBULATORY_CARE_PROVIDER_SITE_OTHER): Payer: Medicare Other

## 2015-02-04 ENCOUNTER — Ambulatory Visit (INDEPENDENT_AMBULATORY_CARE_PROVIDER_SITE_OTHER): Payer: Medicare Other | Admitting: Internal Medicine

## 2015-02-04 VITALS — BP 150/92 | HR 71 | Ht 70.0 in | Wt 354.0 lb

## 2015-02-04 DIAGNOSIS — J449 Chronic obstructive pulmonary disease, unspecified: Secondary | ICD-10-CM

## 2015-02-04 DIAGNOSIS — R0600 Dyspnea, unspecified: Secondary | ICD-10-CM

## 2015-02-04 DIAGNOSIS — R0602 Shortness of breath: Secondary | ICD-10-CM | POA: Diagnosis not present

## 2015-02-04 DIAGNOSIS — J9612 Chronic respiratory failure with hypercapnia: Secondary | ICD-10-CM

## 2015-02-04 DIAGNOSIS — I2510 Atherosclerotic heart disease of native coronary artery without angina pectoris: Secondary | ICD-10-CM | POA: Diagnosis not present

## 2015-02-04 DIAGNOSIS — R05 Cough: Secondary | ICD-10-CM | POA: Diagnosis not present

## 2015-02-04 LAB — BASIC METABOLIC PANEL
BUN: 16 mg/dL (ref 6–23)
CO2: 34 meq/L — AB (ref 19–32)
CREATININE: 1.42 mg/dL (ref 0.40–1.50)
Calcium: 9.1 mg/dL (ref 8.4–10.5)
Chloride: 101 mEq/L (ref 96–112)
GFR: 52.41 mL/min — ABNORMAL LOW (ref >60.00)
Glucose, Bld: 201 mg/dL — ABNORMAL HIGH (ref 70–99)
Potassium: 4.2 mEq/L (ref 3.5–5.1)
Sodium: 143 mEq/L (ref 135–145)

## 2015-02-04 LAB — CBC WITH DIFFERENTIAL/PLATELET
Basophils Absolute: 0 10*3/uL (ref 0.0–0.1)
Basophils Relative: 0.3 % (ref 0.0–3.0)
EOS PCT: 0.6 % (ref 0.0–5.0)
Eosinophils Absolute: 0 10*3/uL (ref 0.0–0.7)
HCT: 43.3 % (ref 39.0–52.0)
HEMOGLOBIN: 14 g/dL (ref 13.0–17.0)
Lymphocytes Relative: 9.8 % — ABNORMAL LOW (ref 12.0–46.0)
Lymphs Abs: 0.7 10*3/uL (ref 0.7–4.0)
MCHC: 32.3 g/dL (ref 30.0–36.0)
MCV: 84.3 fl (ref 78.0–100.0)
MONO ABS: 0.3 10*3/uL (ref 0.1–1.0)
Monocytes Relative: 4.5 % (ref 3.0–12.0)
NEUTROS ABS: 6.3 10*3/uL (ref 1.4–7.7)
Neutrophils Relative %: 84.8 % — ABNORMAL HIGH (ref 43.0–77.0)
PLATELETS: 165 10*3/uL (ref 150.0–400.0)
RBC: 5.14 Mil/uL (ref 4.22–5.81)
RDW: 17.1 % — AB (ref 11.5–15.5)
WBC: 7.4 10*3/uL (ref 4.0–10.5)

## 2015-02-04 LAB — TSH: TSH: 0.82 u[IU]/mL (ref 0.35–4.50)

## 2015-02-04 LAB — BRAIN NATRIURETIC PEPTIDE: Pro B Natriuretic peptide (BNP): 50 pg/mL (ref 0.0–100.0)

## 2015-02-04 MED ORDER — FLUTTER DEVI: Status: AC

## 2015-02-04 NOTE — Patient Instructions (Signed)
Work on Engineer, technical sales technique:  relax and gently blow all the way out then take a nice smooth deep breath back in, triggering the inhaler at same time you start breathing in.  Hold for up to 5 seconds if you can.  Rinse and gargle with water when done  Please remember to go to the lab and x-ray department downstairs for your tests - we will call you with the results when they are available.    Please schedule a follow up office visit in 3 weeks, sooner if needed

## 2015-02-04 NOTE — Progress Notes (Signed)
Quick Note:  Spoke with pt and notified of results per Dr. [name]. Pt verbalized understanding and denied any questions.  ______ 

## 2015-02-04 NOTE — Progress Notes (Signed)
Subjective:    Patient ID: Shane Watson, male    DOB: 20-Jan-1946   MRN: [id number]    Brief patient profile:  68 yowm  Quit smoking 1987 at wt = 240   Referred 05/02/2013 by Shane Watson for low 02. 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  Uses alb seems to help some with assoc subj wheeze rec Breo one puff daily automatically to see if breathing improves Please see patient coordinator before you leave today  to schedule overnight 02 sat on cpap> rx 2lpm     08/06/2014 f/u ov/Shane Watson Shane Watson Watson copd/ morbid obesity/ keeping up with calendar and maint on symb/spiriva  Chief Complaint  Patient presents with  . Follow-up    Pt states that his breathing has been overall doing well. He had some minimal wheezing this am and used neb and this helped. He is using albuterol inhaler once per wk on average and rarely needs neb.   Not limited by breathing from desired activities   rec No change   02/04/2015 f/u ov/Shane Watson re: Morbid obesity/ Shane Watson on sym/spirva respimat no longer using med calendar/ action plan Chief Complaint  Patient presents with  . Follow-up    Pt c/o increased SOB and cough for the past wk "getting better" with taking zpack and pred.      Baseline sob did not change until 10 days  prior to OV  With cough / congestion with dark mucus > rx mucinex  Seen 02/02/15 rec change mucinex dm to mucinex and zpak prednisone 20 x 5 days  Did not use hfa before moving to neb saba as per prev action plan     No obvious day to day or  daytime variabilty or assoc cp or chest tightness, subjective wheeze overt sinus or hb symptoms. No unusual exp hx or h/o childhood pna/ asthma or knowledge of premature birth.  Sleeping ok without nocturnal  or early am exacerbation  of respiratory  c/o's or need for noct saba. Also denies any obvious fluctuation of symptoms with weather or environmental changes or other aggravating or alleviating factors except as outlined above   Current Medications, Allergies, Complete Past Medical History, Past Surgical History, Family History, and Social History were reviewed in Shane Watson.  ROS  The following are not active complaints unless bolded sore throat, dysphagia, dental problems, itching, sneezing,  nasal congestion or excess/ purulent secretions, ear ache,   fever, chills, sweats, unintended wt loss, pleuritic or exertional cp, hemoptysis,  orthopnea pnd or leg swelling sym bilaterally , presyncope, palpitations, heartburn, abdominal pain, anorexia, nausea, vomiting, diarrhea  or change in bowel or urinary habits, change in stools or urine, dysuria,hematuria,  rash, arthralgias, visual complaints, headache, numbness weakness or ataxia or problems with walking or coordination,  change in mood/affect or memory.           Objective:   Physical Exam  08/10/2013      352 > 350 08/25/2013 >  10/17/2013 351 > 01/15/2014  345 > 05/08/2014 345 > 08/06/2014  354 > 02/04/2015 354    amb massively obese wm/ vital signs reviewed note HBP  HEENT: nl dentition, turbinates, and orophanx. Nl external ear canals without cough reflex   NECK :  without JVD/Nodes/TM/ nl carotid upstrokes bilaterally   LUNGS: no acc muscle use, clear to A and P bilaterally with distant bs, no wheeze    CV:  RRR  no s3 or murmur or increase in P2, wearing TEDS with bilateral  Still 2+ pitting edema with chronic venous stasis changes   ABD:  Massively obese soft and nontender with nl excursion in the  supine position. No bruits or organomegaly, bowel sounds nl  MS:  warm without deformities, calf tenderness, cyanosis or clubbing  SKIN: warm and dry without lesions           I personally reviewed images and agree with radiology impression as follows:  CXR:   02/04/15 Bilateral Watson hypoinflation with chronically increased bibasilar lung markings. There is no alveolar pneumonia nor evidence of CHF   Labs ordered/ reviewed:     Chemistry      Component Value Date/Time   NA 143 02/04/2015 1124   K 4.2 02/04/2015 1124   CL 101 02/04/2015 1124   CO2 34* 02/04/2015 1124   BUN 16 02/04/2015 1124   CREATININE 1.42 02/04/2015 1124   CREATININE 1.51* 04/28/2013 1601      Component Value Date/Time   CALCIUM 9.1 02/04/2015 1124       Lab Results  Component Value Date   WBC 7.4 02/04/2015   HGB 14.0 02/04/2015   HCT 43.3 02/04/2015   MCV 84.3 02/04/2015   PLT 165.0 02/04/2015        Lab Results  Component Value Date   TSH 0.82 02/04/2015     Lab Results  Component Value Date   PROBNP 50.0 02/04/2015                 Assessment & Plan:

## 2015-02-05 NOTE — Progress Notes (Signed)
Quick Note:  Spoke with pt and notified of results per Dr. [name]. Pt verbalized understanding and denied any questions.  ______ 

## 2015-02-07 ENCOUNTER — Other Ambulatory Visit: Payer: Self-pay | Admitting: Cardiology

## 2015-02-11 ENCOUNTER — Encounter: Payer: Self-pay | Admitting: Internal Medicine

## 2015-02-11 NOTE — Assessment & Plan Note (Signed)
Body mass index is 50.79    Lab Results  Component Value Date   TSH 0.82 02/04/2015     Contributing to gerd tendency/ doe/reviewed need  achieve and maintain neg calorie balance > defer f/u primary care including intermittently monitoring thyroid status

## 2015-02-11 NOTE — Assessment & Plan Note (Addendum)
-   HCO3 33 02/04/15   On 02 at 2lpm hs and with exertion   Adequate control on present rx, reviewed > no change in rx needed for now but may need to convert over to bipap instead of cpap eventually if doesn't get wt turned around

## 2015-02-11 NOTE — Assessment & Plan Note (Addendum)
-   started breo 05/02/13 > d/c 06/06/13 and replaced with symbicort   - PFT's 06/06/13  FEV1 1.59 (47%) ratio 62 and 13% better despite  breo am ov test, DLC0 82 corrects to 113% - med calendar .08/25/2013   DDX of  difficult airways management all start with A and  include Adherence, Ace Inhibitors, Acid Reflux, Active Sinus Disease, Alpha 1 Antitripsin deficiency, Anxiety masquerading as Airways dz,  ABPA,  allergy(esp in young), Aspiration (esp in elderly), Adverse effects of meds,  Active smokers, A bunch of PE's (a small clot burden can't cause this syndrome unless there is already severe underlying pulm or vascular dz with poor reserve) plus two Bs  = Bronchiectasis and Beta blocker use..and one C= CHF  Adherence is always the initial "prime suspect" and is a multilayered concern that requires a "trust but verify" approach in every patient - starting with knowing how to use medications, especially inhalers, correctly, keeping up with refills and understanding the fundamental difference between maintenance and prns vs those medications only taken for a very short course and then stopped and not refilled.  - no longer using med calendar - The proper method of use, as well as anticipated side effects, of a metered-dose inhaler are discussed and demonstrated to the patient. Improved effectiveness after extensive coaching during this visit to a level of approximately  75% from a baseline of < 50% so def room for improvement    ? Acid (or non-acid) GERD > always difficult to exclude as up to 75% of pts in some series report no assoc GI/ Heartburn symptoms> rec continue max (24h)  acid suppression and diet restrictions/ reviewed     ? Allergy / asthma component > complete prednisone  ? chf >  Excluded with BNP << 100   I had an extended discussion with the patient reviewing all relevant studies completed to date and  lasting 15 to 20 minutes of a 25 minute visit    Each maintenance medication was  reviewed in detail including most importantly the difference between maintenance and prns and under what circumstances the prns are to be triggered using an action plan format that is not reflected in the computer generated alphabetically organized AVS.    Please see instructions for details which were reviewed in writing and the patient given a copy highlighting the part that I personally wrote and discussed at today's ov.

## 2015-02-11 NOTE — Assessment & Plan Note (Signed)
Likely multifactorial but mostly related to obesity which has not been corrected over the longterm.

## 2015-02-19 DIAGNOSIS — E291 Testicular hypofunction: Secondary | ICD-10-CM | POA: Diagnosis not present

## 2015-02-25 ENCOUNTER — Ambulatory Visit (INDEPENDENT_AMBULATORY_CARE_PROVIDER_SITE_OTHER): Payer: Medicare Other | Admitting: Internal Medicine

## 2015-02-25 ENCOUNTER — Encounter: Payer: Self-pay | Admitting: Internal Medicine

## 2015-02-25 VITALS — BP 132/64 | HR 82 | Ht 71.0 in | Wt 353.0 lb

## 2015-02-25 DIAGNOSIS — J9612 Chronic respiratory failure with hypercapnia: Secondary | ICD-10-CM

## 2015-02-25 DIAGNOSIS — J449 Chronic obstructive pulmonary disease, unspecified: Secondary | ICD-10-CM

## 2015-02-25 NOTE — Progress Notes (Signed)
Subjective:    Patient ID: Shane Watson, male    DOB: 29-Mar-1946   MRN: [id number]    Brief patient profile:  29 yowm  Quit smoking 1987 at wt = 240   Referred 05/02/2013 by [name] for low 02. 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  Uses alb seems to help some with assoc subj wheeze rec Breo one puff daily automatically to see if breathing improves Please see patient coordinator before you leave today  to schedule overnight 02 sat on cpap> rx 2lpm      02/04/2015 f/u ov/[name] re: Morbid obesity/ GOLD II on sym/spirva respimat no longer using med calendar/ action plan Chief Complaint  Patient presents with  . Follow-up    Pt c/o increased SOB and cough for the past wk "getting better" with taking zpack and pred.      Baseline sob did not change until 10 days  prior to OV  With cough / congestion with dark mucus > rx mucinex  Seen 02/02/15 rec change mucinex dm to mucinex and zpak prednisone 20 x 5 days  Did not use hfa before moving to neb saba as per prev action plan  rec Work on perfecting  inhaler technique     02/25/2015 f/u ov/[name] re: COPD II/ morbid obesity  Chief Complaint  Patient presents with  . Follow-up    Pt states breathing has improved. He is using albuterol 1 x per wk on average.     Not limited by breathing from desired activities  But by back/legs - does not alter rx with lasix and has significant edema on lasix 40 mg daily   No obvious day to day or daytime variabilty or assoc cough cp or chest tightness,  subjective wheeze overt sinus or hb symptoms. No unusual exp hx or h/o childhood pna/ asthma or knowledge of premature birth.  Sleeping ok without nocturnal  or early am exacerbation  of respiratory  c/o's or need for noct saba. Also denies any obvious fluctuation of symptoms with weather or environmental changes or other aggravating or alleviating factors except as outlined above   Current Medications, Allergies, Complete Past Medical History, Past Surgical History, Family History, and Social History were reviewed in [employer] record.  ROS  The following are not active complaints unless bolded sore throat, dysphagia, dental problems, itching, sneezing,  nasal congestion or excess/ purulent secretions, ear ache,   fever, chills, sweats, unintended wt loss, pleuritic or exertional cp, hemoptysis,  orthopnea pnd or leg swelling sym bilaterally , presyncope, palpitations, heartburn, abdominal pain, anorexia, nausea, vomiting, diarrhea  or change in bowel or urinary habits, change in stools or urine, dysuria,hematuria,  rash, arthralgias, visual complaints, headache, numbness weakness or ataxia or problems with walking or coordination,  change in mood/affect or memory.           Objective:   Physical Exam  08/10/2013      352 > 350 08/25/2013 > 10/17/2013 351 >  01/15/2014  345 > 05/08/2014 345 > 08/06/2014  354 > 02/04/2015 354 > 02/25/2015  354    amb massively obese wm/ vital signs reviewed    HEENT: nl dentition, turbinates, and orophanx. Nl external ear canals without cough reflex   NECK :  without JVD/Nodes/TM/ nl carotid upstrokes bilaterally   LUNGS: no acc muscle use, clear to A and P bilaterally with distant bs, no wheeze    CV:  RRR  no s3 or murmur or increase in P2, wearing TEDS with bilateral  Still 2+ pitting edema with chronic venous stasis changes   ABD:  Massively obese soft and nontender with nl excursion in the supine position. No bruits or organomegaly,  bowel sounds nl  MS:  warm without deformities, calf tenderness, cyanosis or clubbing  SKIN: warm and dry without lesions           I personally reviewed images and agree with radiology impression as follows:  CXR:   02/04/15 Bilateral pulmonary hypoinflation with chronically increased bibasilar lung markings. There is no alveolar pneumonia nor evidence of CHF   Labs  reviewed:     Chemistry      Component Value Date/Time   NA 143 02/04/2015 1124   K 4.2 02/04/2015 1124   CL 101 02/04/2015 1124   CO2 34* 02/04/2015 1124   BUN 16 02/04/2015 1124   CREATININE 1.42 02/04/2015 1124   CREATININE 1.51* 04/28/2013 1601      Component Value Date/Time   CALCIUM 9.1 02/04/2015 1124       Lab Results  Component Value Date   WBC 7.4 02/04/2015   HGB 14.0 02/04/2015   HCT 43.3 02/04/2015   MCV 84.3 02/04/2015   PLT 165.0 02/04/2015        Lab Results  Component Value Date   TSH 0.82 02/04/2015     Lab Results  Component Value Date   PROBNP 50.0 02/04/2015              Assessment & Plan:

## 2015-02-25 NOTE — Assessment & Plan Note (Signed)
-   HCO3 33 02/04/15  - 02/25/2015 to desat's walking   rec as of 02/25/2015 02 2lpm with cpap only at bedtime

## 2015-02-25 NOTE — Patient Instructions (Signed)
Work on maintaining perfect  inhaler technique:  relax and gently blow all the way out then take a nice smooth deep breath back in, triggering the inhaler at same time you start breathing in.  Hold for up to 5 seconds if you can.  Rinse and gargle with water when done  Please see patient coordinator before you leave today  to schedule referral to rehab - you may need updated pfts we can schedule if needed to qualify for rehab.    See calendar for specific medication instructions and bring it back for each and every office visit for every healthcare provider you see.  Without it,  you may not receive the best quality medical care that we feel you deserve.  You will note that the calendar groups together  your maintenance  medications that are timed at particular times of the day.  Think of this as your checklist for what your doctor has instructed you to do until your next evaluation to see what benefit  there is  to staying on a consistent group of medications intended to keep you well.  The other group at the bottom is entirely up to you to use as you see fit  for specific symptoms that may arise between visits that require you to treat them on an as needed basis.  Think of this as your action plan or "what if" list.   Separating the top medications from the bottom group is fundamental to providing you adequate care going forward.     Please schedule a follow up visit in 3 months but call sooner if needed

## 2015-02-25 NOTE — Assessment & Plan Note (Addendum)
-   started breo 05/02/13 > d/c 06/06/13 and replaced with symbicort   - PFT's 06/06/13  FEV1 1.59 (47%) ratio 62 and 13% better despite  breo am ov test, DLC0 82 corrects to 113% - med calendar .08/25/2013  - 02/04/2015 p extensive coaching HFA effectiveness =  75% - flutter valve added 02/04/15  - 02/25/2015  Walked RA x 3 laps @ 185 ft each stopped due to End of study, nl pace, no desat  / min sob/ some hip and leg pain   02/25/2015 p extensive coaching HFA effectiveness =    90%   I had an extended final summary discussion with the patient reviewing all relevant studies completed to date and  lasting 15 to 20 minutes of a 25 minute visit on the following issues:    1)   May benefit from rehab > referred    2)  Each maintenance medication was reviewed in detail including most importantly the difference between maintenance and as needed and under what circumstances the prns are to be used. This was done in the context of a medication calendar review which provided the patient with a user-friendly unambiguous mechanism for medication administration and reconciliation and provides an action plan for all active problems. It is critical that this be shown to every doctor  for modification during the office visit if necessary so the patient can use it as a working document.      See instructions for specific recommendations which were reviewed directly with the patient who was given a copy with highlighter outlining the key components.

## 2015-02-25 NOTE — Assessment & Plan Note (Signed)
Body mass index is 49.26 kg/(m^2).  Lab Results  Component Value Date   TSH 0.82 02/04/2015     Contributing to gerd tendency/ doe/reviewed need  achieve and maintain neg calorie balance > defer f/u primary care including intermittently monitoring thyroid status

## 2015-03-03 ENCOUNTER — Encounter: Payer: Self-pay | Admitting: Internal Medicine

## 2015-03-12 ENCOUNTER — Other Ambulatory Visit: Payer: Self-pay | Admitting: Internal Medicine

## 2015-03-12 DIAGNOSIS — G4733 Obstructive sleep apnea (adult) (pediatric): Secondary | ICD-10-CM

## 2015-03-14 DIAGNOSIS — E291 Testicular hypofunction: Secondary | ICD-10-CM | POA: Diagnosis not present

## 2015-03-27 ENCOUNTER — Encounter: Payer: Self-pay | Admitting: Internal Medicine

## 2015-04-01 ENCOUNTER — Telehealth (HOSPITAL_COMMUNITY): Payer: Self-pay

## 2015-04-02 DIAGNOSIS — E291 Testicular hypofunction: Secondary | ICD-10-CM | POA: Diagnosis not present

## 2015-04-05 ENCOUNTER — Encounter (HOSPITAL_COMMUNITY)
Admission: RE | Admit: 2015-04-05 | Discharge: 2015-04-05 | Disposition: A | Discharge status: Home / Self Care | Payer: Medicare Other | Source: Ambulatory Visit | Attending: Internal Medicine | Admitting: Internal Medicine

## 2015-04-05 ENCOUNTER — Encounter (HOSPITAL_COMMUNITY): Payer: Self-pay

## 2015-04-05 VITALS — BP 164/67 | HR 66 | Resp 18 | Ht 70.5 in | Wt 353.2 lb

## 2015-04-05 DIAGNOSIS — G4730 Sleep apnea, unspecified: Secondary | ICD-10-CM | POA: Insufficient documentation

## 2015-04-05 DIAGNOSIS — J449 Chronic obstructive pulmonary disease, unspecified: Secondary | ICD-10-CM | POA: Insufficient documentation

## 2015-04-05 HISTORY — DX: Cardiac murmur, unspecified: R01.1

## 2015-04-05 HISTORY — DX: Atherosclerotic heart disease of native coronary artery without angina pectoris: I25.10

## 2015-04-05 NOTE — Progress Notes (Signed)
Shane Watson 69 y.o. male Pulmonary Rehab Orientation Note Patient arrived today in [facility] for orientation to Pulmonary Rehab. He was transported from [employer] via wheel chair. He does carry a portable oxygen concentrator, but states he only wears it at 2 lpm if he is going to exert himself. He did not feel he needed to wear it today as he was transported to his appointment. Per pt, he uses oxygen continuously at night along with his cpap, also at 2 lpm. Color good, skin warm and dry. Moderate pitting edema noted to lower legs; compression socks present. Patient is oriented to time and place. Psychosocial assessment reveals no barriers to participation in pulmonary rehab at this time. PHQ2 score of 1. Patient's medical history and medications reviewed. Heart rate is normal, S1S2 noted.  Mild-moderate expiratory wheezing heard diffusely throughout both lungs. Patient denies breathing difficulties, O2 saturation on RA 94%. Patient stated he carries his "rescue inhaler" with him at all times, but instructed by pulmonologist to use only when having difficulty breathing. Grip strength equal, strong. Distal pulses palpable. Patient reports he does take medications as prescribed. Patient states he follows a Regular diet. Encouraged patient to switch back to a heart healthy diet and discussed alternatives to eating out every meal. Patient will have a nutrition consult while in the program to discuss diet and dieting goals. The patient reports no specific efforts to gain or lose weight. BMI 49.5. Patient's weight will be monitored closely. Demonstration and practice of PLB using pulse oximeter. Patient able to return demonstration satisfactorily. Safety and hand hygiene in the exercise area reviewed with patient. Patient voices understanding of the information reviewed. Department expectations discussed with patient and achievable goals were set. The patient shows enthusiasm about attending the  program and we look forward to working with this nice gentleman. The patient is scheduled for a 6 min walk test on Tuesday 9/20 at 3:45 and to begin exercise on Thursday 9/22 in the 1030 class.   45 minutes was spent on a variety of activities such as assessment of the patient, obtaining baseline data including height, weight, BMI, and grip strength, verifying medical history, allergies, and current medications, and teaching patient strategies for performing tasks with less respiratory effort with emphasis on pursed lip breathing.

## 2015-04-08 ENCOUNTER — Encounter: Payer: Self-pay | Admitting: Cardiology

## 2015-04-08 ENCOUNTER — Ambulatory Visit (INDEPENDENT_AMBULATORY_CARE_PROVIDER_SITE_OTHER): Payer: Medicare Other | Admitting: Cardiology

## 2015-04-08 VITALS — BP 160/86 | HR 85 | Ht 71.0 in | Wt 323.8 lb

## 2015-04-08 DIAGNOSIS — G4733 Obstructive sleep apnea (adult) (pediatric): Secondary | ICD-10-CM

## 2015-04-08 DIAGNOSIS — I2583 Coronary atherosclerosis due to lipid rich plaque: Principal | ICD-10-CM

## 2015-04-08 DIAGNOSIS — I2510 Atherosclerotic heart disease of native coronary artery without angina pectoris: Secondary | ICD-10-CM | POA: Diagnosis not present

## 2015-04-08 MED ORDER — NITROGLYCERIN 0.4 MG SL SUBL: 0.4000 mg | SUBLINGUAL_TABLET | SUBLINGUAL | Status: DC | PRN

## 2015-04-08 MED ORDER — DILTIAZEM HCL 60 MG PO TABS: 60.0000 mg | ORAL_TABLET | Freq: Four times a day (QID) | ORAL | Status: DC | PRN

## 2015-04-08 NOTE — Patient Instructions (Signed)
Medication Instructions:  You may take Diltiazem 60 mg once every 6 hours as needed. Continue all other medications as listed.  Follow-Up: Follow up in 6 months with Dr. [name].  You will receive a letter in the mail 2 months before you are due.  Please call us when you receive this letter to schedule your follow up appointment.  Thank you for choosing [facility]!!

## 2015-04-08 NOTE — Progress Notes (Signed)
[facility] [address], [address], [place]  [address] Phone: [phone] Fax:  [phone]  Date:  04/08/2015   ID:  CORNELIS EADIE, DOB 01-Jan-1946, MRN [id number]  PCP:  Shirline Frees, MD   History of Present Illness: Shane Watson is a 69 y.o. male with coronary artery disease with percutaneous intervention DES x2 to the mid and proximal LAD 12/18/10 here for followup.  Prior cellulitis. Brief hospital stay, urgent care followup, injection and antibiotic orally. Doing much better.  Saw Dr. [name] for chronic bronchitis. Checked oxygen and prescribed for a while. Now only wears when exerts himself. OSA, CPAP.   Wears compression hose for edema.   Denies syncope, fevers other than associated fevers with cellulitis recently.  Previously, feels some burning in chest in the middle of the night that resolves with water. Takes famotidine   One night  palpitations HR 150 ? 180 bpm sweating, HR on watch. also on pulse oximeter. Lasted approximately 30 minutes. He took nitroglycerin, this subsided.   Wt Readings from Last 3 Encounters:  04/08/15 323 lb 12.8 oz (146.875 kg)  04/05/15 353 lb 2.8 oz (160.2 kg)  02/25/15 353 lb (160.12 kg)     Past Medical History  Diagnosis Date  . Allergy   . Anemia   . Neuropathy   . COPD (chronic obstructive pulmonary disease)   . Hyperthyroidism   . Hyperlipidemia   . Vertigo   . Chronic kidney disease   . Coronary artery disease   . Heart murmur     Past Surgical History  Procedure Laterality Date  . Cholecystectomy    . 2 heart stents    . Right knee surgery      "scraped it out"    Current Outpatient Prescriptions  Medication Sig Dispense Refill  . acetaminophen (TYLENOL) 325 MG tablet Take 325 mg by mouth every 4 (four) hours as needed for moderate pain. Per bottle as needed for pain    . albuterol (PROVENTIL HFA;VENTOLIN HFA) 108 (90 BASE) MCG/ACT inhaler Inhale 2 puffs into the lungs every 4 (four) hours as needed  for wheezing or shortness of breath (((PLAN A))).     Marland Kitchen albuterol (PROVENTIL) (5 MG/ML) 0.5% nebulizer solution Take 2.5 mg by nebulization every 4 (four) hours as needed for wheezing or shortness of breath (((PLAN B))).     Marland Kitchen aspirin 81 MG tablet Take 81 mg by mouth daily.    Marland Kitchen atorvastatin (LIPITOR) 40 MG tablet Take 1 tablet by mouth at  bedtime 90 tablet 1  . budesonide-formoterol (SYMBICORT) 160-4.5 MCG/ACT inhaler Take 2 puffs first thing in am and then another 2 puffs about 12 hours later. 3 Inhaler 3  . cetirizine (ZYRTEC) 10 MG tablet Take 10 mg by mouth every morning.     . colesevelam (WELCHOL) 625 MG tablet Take 1 tablet (625 mg total) by mouth daily with breakfast. 90 tablet 1  . doxazosin (CARDURA) 8 MG tablet Take 1 tablet by mouth  daily 90 tablet 1  . famotidine (PEPCID) 20 MG tablet Take 1 tablet by mouth at  bedtime 90 tablet 1  . furosemide (LASIX) 20 MG tablet Take 2 tablets by mouth  daily 180 tablet 0  . gabapentin (NEURONTIN) 100 MG capsule 3 capsules by mouth twice daily    . guaiFENesin (MUCINEX) 600 MG 12 hr tablet Take 600 mg by mouth 2 (two) times daily as needed.    Marland Kitchen losartan (COZAAR) 50 MG  tablet Take 1 tablet by mouth  daily 90 tablet 3  . meclizine (ANTIVERT) 12.5 MG tablet 2 tabs by mouth every morning    . Multiple Vitamin (MULTIVITAMIN WITH MINERALS) TABS tablet Take 1 tablet by mouth at bedtime.     . nitroGLYCERIN (NITROSTAT) 0.4 MG SL tablet Place 1 tablet (0.4 mg total) under the tongue every 5 (five) minutes as needed for chest pain. 60 tablet 3  . pantoprazole (PROTONIX) 40 MG tablet Take 1 tablet by mouth  daily 90 tablet 1  . potassium chloride SA (K-DUR,KLOR-CON) 20 MEQ tablet Take 1 tablet by mouth  daily 90 tablet 3  . Respiratory Therapy Supplies (FLUTTER) DEVI Use as directed 1 each 0  . SPIRIVA RESPIMAT 2.5 MCG/ACT AERS INHALE 2 PUFFS INTO THE LUNGS EVERY MORNING 4 g 1   No current facility-administered medications for this visit.     Allergies:    Allergies  Allergen Reactions  . Sulfa Antibiotics Hives  . Aspirin Swelling    Takes aspirin.  Marland Kitchen Penicillins Hives    Social History:  The patient  reports that he quit smoking about 27 years ago. His smoking use included Cigarettes. He has a 22.5 pack-year smoking history. He has never used smokeless tobacco. He reports that he does not drink alcohol or use illicit drugs.   ROS:  Please see the history of present illness.   PHYSICAL EXAM: VS:  BP 160/86 mmHg  Pulse 85  Ht 5\' 11"  (1.803 m)  Wt 323 lb 12.8 oz (146.875 kg)  BMI 45.18 kg/m2 Well nourished, well developed, in no acute distress HEENT: normal thick neck Neck: no JVD Cardiac:  normal S1, S2; RRR; no murmur Lungs:  clear to auscultation bilaterally, no wheezing, rhonchi or rales Abd: soft, nontender, no hepatomegalyMorbidly obese Ext: 1+ edema, compression hose.  Skin: warm and dry Neuro: no focal abnormalities noted  EKG:   Today 04/08/15-sinus rhythm, 85, no other abnormalities personally viewed-prior 10/11/13-normal sinus rhythm, 66, no other changes.    Labs: LDL 2013 was 54. Repeating with Dr. Kenton Kingfisher  ASSESSMENT AND PLAN:  1. Coronary artery disease-prior percutaneous intervention. Stable no angina. 2. Hypertension-well controlled today. Continue current medications.  Monitor. 3. Morbid obesity-strongly encourage weight loss.this will help with a multitude of his medical issues.  4. COPD - Dr. [name]. O2. Pulm rehab. 5. OSA - CPAP with O2 6. Hyperlipidemia - Lipitor, LDL goal 70. At goal previously. 7. Chronic Kidney disease stage 3-creatinine 1.5.  8. Palpitations-I prescribed to him diltiazem 60 mg short acting every 6 hours as needed. if this continues to occur, we will place an event monitor. Given his morbid obesity and other conditions, this could be manifestation of atrial flutter or perhaps fibrillation.  9. 6 month followup  Signed, Candee Furbish, MD [facility]  04/08/2015 2:48 PM

## 2015-04-09 ENCOUNTER — Encounter (HOSPITAL_COMMUNITY)
Admission: RE | Admit: 2015-04-09 | Discharge: 2015-04-09 | Disposition: A | Discharge status: Home / Self Care | Payer: Medicare Other | Source: Ambulatory Visit | Attending: Internal Medicine | Admitting: Internal Medicine

## 2015-04-09 DIAGNOSIS — G4730 Sleep apnea, unspecified: Secondary | ICD-10-CM | POA: Diagnosis not present

## 2015-04-09 DIAGNOSIS — J449 Chronic obstructive pulmonary disease, unspecified: Secondary | ICD-10-CM | POA: Diagnosis not present

## 2015-04-09 NOTE — Progress Notes (Signed)
Nickolis completed a Six-Minute Walk Test on 04/09/15 . Alistar walked 1088 feet with 1 break lasting 20 seconds.  The patient's lowest oxygen saturation was 88 %, highest heart rate was 120 bpm , and highest blood pressure was 180/68. The patient was on 2 liters of oxygen with a nasal cannula. Patient stated that back and hip pain hindered their walk test.

## 2015-04-11 ENCOUNTER — Encounter (HOSPITAL_COMMUNITY)
Admission: RE | Admit: 2015-04-11 | Discharge: 2015-04-11 | Disposition: A | Discharge status: Home / Self Care | Payer: Medicare Other | Source: Ambulatory Visit | Attending: Internal Medicine | Admitting: Internal Medicine

## 2015-04-11 DIAGNOSIS — J449 Chronic obstructive pulmonary disease, unspecified: Secondary | ICD-10-CM | POA: Diagnosis not present

## 2015-04-11 DIAGNOSIS — G4730 Sleep apnea, unspecified: Secondary | ICD-10-CM | POA: Diagnosis not present

## 2015-04-11 NOTE — Progress Notes (Signed)
Today, Tashi exercised at [facility] [facility]. Service time was from 1030 to 1230.  The patient exercised by performing aerobic, strengthening, and stretching exercises. Oxygen saturation, heart rate, blood pressure, rate of perceived exertion, and shortness of breath were all monitored before, during, and after exercise. Henri presented with no problems at today's exercise session.He attended signs and symptoms class today.  The patient did not have an increase in workload intensity during today's exercise session.  Pre-exercise vitals: . Weight kg: 162.2 . Liters of O2: RA . SpO2: 90 . HR: 69 . BP: 134/64 . CBG: NA  Exercise vitals: . Highest heartrate:  82 . Lowest oxygen saturation: 93 . Highest blood pressure: 140/74 . Liters of 02: 2  Post-exercise vitals: . SpO2: 95 . HR: 70 . BP: 122/60 . Liters of O2: 2 . CBG: NA Dr. Brand Males, Medical Director Dr. Ree Kida is immediately available during today's Pulmonary Rehab session for SOLO WILCOCK on 04/11/2015  at 1030 class time.  Marland Kitchen

## 2015-04-16 ENCOUNTER — Encounter (HOSPITAL_COMMUNITY)
Admission: RE | Admit: 2015-04-16 | Discharge: 2015-04-16 | Disposition: A | Discharge status: Home / Self Care | Payer: Medicare Other | Source: Ambulatory Visit | Attending: Internal Medicine | Admitting: Internal Medicine

## 2015-04-16 DIAGNOSIS — G4730 Sleep apnea, unspecified: Secondary | ICD-10-CM | POA: Diagnosis not present

## 2015-04-16 DIAGNOSIS — J449 Chronic obstructive pulmonary disease, unspecified: Secondary | ICD-10-CM | POA: Diagnosis not present

## 2015-04-16 NOTE — Progress Notes (Signed)
Today, Shane Watson exercised at [facility]. Service time was from 10:30am to 12:15pm.  The patient exercised by performing aerobic, strengthening, and stretching exercises. Oxygen saturation, heart rate, blood pressure, rate of perceived exertion, and shortness of breath were all monitored before, during, and after exercise. [name] presented with no problems at today's exercise session.  The patient did not have an increase in workload intensity during today's exercise session.  Pre-exercise vitals: . Weight kg: 161.3 . Liters of O2: ra . SpO2: 91 . HR: 71 . BP: 140/80 . CBG: na  Exercise vitals: . Highest heartrate:  115 . Lowest oxygen saturation: 92 . Highest blood pressure: 166/84 . Liters of 02: 2  Post-exercise vitals: . SpO2: 94 . HR: 76 . BP: 130/66 . Liters of O2: 2 . CBG: na  Dr. Brand Males, Medical Director Dr. Ree Kida is immediately available during today's Pulmonary Rehab session for Alyson Locket on 04/16/15 at 10:30am class time.

## 2015-04-18 ENCOUNTER — Encounter (HOSPITAL_COMMUNITY)
Admission: RE | Admit: 2015-04-18 | Discharge: 2015-04-18 | Disposition: A | Discharge status: Home / Self Care | Payer: Medicare Other | Source: Ambulatory Visit | Attending: Internal Medicine | Admitting: Internal Medicine

## 2015-04-18 DIAGNOSIS — J449 Chronic obstructive pulmonary disease, unspecified: Secondary | ICD-10-CM | POA: Diagnosis not present

## 2015-04-18 DIAGNOSIS — G4730 Sleep apnea, unspecified: Secondary | ICD-10-CM | POA: Diagnosis not present

## 2015-04-18 NOTE — Progress Notes (Signed)
Today, Shane Watson exercised at [facility]. Service time was from 10:30am to 12:40pm.  The patient exercised by performing aerobic, strengthening, and stretching exercises. Oxygen saturation, heart rate, blood pressure, rate of perceived exertion, and shortness of breath were all monitored before, during, and after exercise. Shane Watson presented with no problems at today's exercise session. The patient attended education today with Shane Watson on Anatomy and Physiology.  The patient did not have an increase in workload intensity during today's exercise session.  Pre-exercise vitals: . Weight kg: 160.3 . Liters of O2: a . SpO2: 94 . HR: 82 . BP: 130/70 . CBG: na  Exercise vitals: . Highest heartrate:  81 . Lowest oxygen saturation: 93 . Highest blood pressure: 146/66 . Liters of 02: 2  Post-exercise vitals: . SpO2: 91 . HR: 71 . BP: 150/74 . Liters of O2: 2 . CBG: na  Shane Watson, Shane Watson Shane Watson is immediately available during today's Pulmonary Rehab session for Shane Watson on 04/18/15 at 10:30am class time.

## 2015-04-19 ENCOUNTER — Other Ambulatory Visit: Payer: Self-pay | Admitting: Cardiology

## 2015-04-19 ENCOUNTER — Other Ambulatory Visit: Payer: Self-pay | Admitting: Internal Medicine

## 2015-04-23 ENCOUNTER — Encounter (HOSPITAL_COMMUNITY)
Admission: RE | Admit: 2015-04-23 | Discharge: 2015-04-23 | Disposition: A | Discharge status: Home / Self Care | Payer: Medicare Other | Source: Ambulatory Visit | Attending: Internal Medicine | Admitting: Internal Medicine

## 2015-04-23 DIAGNOSIS — E291 Testicular hypofunction: Secondary | ICD-10-CM | POA: Diagnosis not present

## 2015-04-23 DIAGNOSIS — G4730 Sleep apnea, unspecified: Secondary | ICD-10-CM | POA: Insufficient documentation

## 2015-04-23 DIAGNOSIS — J449 Chronic obstructive pulmonary disease, unspecified: Secondary | ICD-10-CM | POA: Insufficient documentation

## 2015-04-23 NOTE — Progress Notes (Signed)
Today, Shane Watson exercised at [facility]. Service time was from 1030 to 1220.  The patient exercised by performing aerobic, strengthening, and stretching exercises. Oxygen saturation, heart rate, blood pressure, rate of perceived exertion, and shortness of breath were all monitored before, during, and after exercise. Shane Watson presented with no problems at today's exercise session.  The patient did  have an increase in workload intensity during today's exercise session.  Pre-exercise vitals: . Weight kg: 162.5 . Liters of O2: 2 . SpO2: 95 . HR: 67 . BP: 120/58 . CBG: na  Exercise vitals: . Highest heartrate:  98 . Lowest oxygen saturation: 86 which increased to 92 . Highest blood pressure: 154/82 . Liters of 02: 2  Post-exercise vitals: . SpO2: 95 . HR: 67 . BP: 124/60 . Liters of O2: 2 . CBG: na Dr. Brand Males, Medical Director Dr. Allyson Sabal is immediately available during today's Pulmonary Rehab session for Shane Watson on 04/23/2015  at 1030 class time  .

## 2015-04-25 ENCOUNTER — Encounter (HOSPITAL_COMMUNITY)
Admission: RE | Admit: 2015-04-25 | Discharge: 2015-04-25 | Disposition: A | Discharge status: Home / Self Care | Payer: Medicare Other | Source: Ambulatory Visit | Attending: Internal Medicine | Admitting: Internal Medicine

## 2015-04-25 DIAGNOSIS — G4730 Sleep apnea, unspecified: Secondary | ICD-10-CM | POA: Diagnosis not present

## 2015-04-25 DIAGNOSIS — J449 Chronic obstructive pulmonary disease, unspecified: Secondary | ICD-10-CM | POA: Diagnosis not present

## 2015-04-25 NOTE — Progress Notes (Signed)
Today, Shane Watson exercised at [facility]. Service time was from 1030 to 1230.  The patient exercised by performing aerobic, strengthening, and stretching exercises. Oxygen saturation, heart rate, blood pressure, rate of perceived exertion, and shortness of breath were all monitored before, during, and after exercise. [name] presented with no problems at today's exercise session.  Patient attended Nutrition for the Pulmonary Patient class today.  The patient did not have an increase in workload intensity during today's exercise session.  Pre-exercise vitals: . Weight kg: 162.6 . Liters of O2: 2 . SpO2: 93 . HR: 69 . BP: 152/70 . CBG: na  Exercise vitals: . Highest heartrate:  97 . Lowest oxygen saturation: 93 . Highest blood pressure: 158/60 . Liters of 02: 2  Post-exercise vitals: . SpO2: 93 . HR: 70 . BP: 140/60 . Liters of O2: 2 . CBG: na Dr. Brand Males, Medical Director Dr. Allyson Sabal is immediately available during today's Pulmonary Rehab session for Shane Watson on 04/25/2015  at 1030 class time  .

## 2015-04-30 ENCOUNTER — Encounter (HOSPITAL_COMMUNITY)
Admission: RE | Admit: 2015-04-30 | Discharge: 2015-04-30 | Disposition: A | Discharge status: Home / Self Care | Payer: Medicare Other | Source: Ambulatory Visit | Attending: Internal Medicine | Admitting: Internal Medicine

## 2015-04-30 DIAGNOSIS — G4730 Sleep apnea, unspecified: Secondary | ICD-10-CM | POA: Diagnosis not present

## 2015-04-30 DIAGNOSIS — J449 Chronic obstructive pulmonary disease, unspecified: Secondary | ICD-10-CM | POA: Diagnosis not present

## 2015-04-30 NOTE — Progress Notes (Signed)
Today, Turhan exercised at [facility]. Service time was from 10:30am to 12:00pm.  The patient exercised by performing aerobic, strengthening, and stretching exercises. Oxygen saturation, heart rate, blood pressure, rate of perceived exertion, and shortness of breath were all monitored before, during, and after exercise. Shane Watson presented with no problems at today's exercise session.  The patient did have an increase in workload intensity during today's exercise session.  Pre-exercise vitals: . Weight kg: 160.4 . Liters of O2: 2 . SpO2: 93 . HR: 70 . BP: 144/60 . CBG: na  Exercise vitals: . Highest heartrate:  93 . Lowest oxygen saturation: 93 . Highest blood pressure: 160/80 . Liters of 02: 2  Post-exercise vitals: . SpO2: 94 . HR: 70 . BP: 138/62 . Liters of O2: 2 . CBG: na  Dr. Brand Males, Medical Director Dr. Marily Memos is immediately available during today's Pulmonary Rehab session for Alyson Locket on 04/30/15 at 10:30am class time

## 2015-05-01 ENCOUNTER — Other Ambulatory Visit: Payer: Self-pay | Admitting: Internal Medicine

## 2015-05-01 ENCOUNTER — Telehealth: Payer: Self-pay | Admitting: Internal Medicine

## 2015-05-01 NOTE — Telephone Encounter (Signed)
Spoke with pt, advised that we have no samples of pepcid but a symbicort sample (from MW's bin) has been placed up front for him.  Nothing further needed.

## 2015-05-02 ENCOUNTER — Encounter (HOSPITAL_COMMUNITY): Payer: Medicare Other

## 2015-05-07 ENCOUNTER — Encounter (HOSPITAL_COMMUNITY)
Admission: RE | Admit: 2015-05-07 | Discharge: 2015-05-07 | Disposition: A | Discharge status: Home / Self Care | Payer: Medicare Other | Source: Ambulatory Visit | Attending: Internal Medicine | Admitting: Internal Medicine

## 2015-05-07 DIAGNOSIS — G4730 Sleep apnea, unspecified: Secondary | ICD-10-CM | POA: Diagnosis not present

## 2015-05-07 DIAGNOSIS — J449 Chronic obstructive pulmonary disease, unspecified: Secondary | ICD-10-CM | POA: Diagnosis not present

## 2015-05-07 NOTE — Progress Notes (Signed)
Today, [name] exercised at [employer] [facility]. Service time was from 1030 to 1205.  The patient exercised by performing aerobic, strengthening, and stretching exercises. Oxygen saturation, heart rate, blood pressure, rate of perceived exertion, and shortness of breath were all monitored before, during, and after exercise. Shane Watson presented with no problems at today's exercise session.  The patient did not have an increase in workload intensity during today's exercise session.  Pre-exercise vitals: . Weight kg: 158.8 . Liters of O2: 2 . SpO2: 92 . HR: 66 . BP: 122/70 . CBG:   Exercise vitals: . Highest heartrate:  95 . Lowest oxygen saturation: 96 . Highest blood pressure: 138/70 . Liters of 02: 2  Post-exercise vitals: . SpO2: 94 . HR: 76 . BP: 120/64 . Liters of O2: 2 . CBG: NA Dr. Brand Males, Medical Director Dr. Tamala Julian is immediately available during today's Pulmonary Rehab session for Shane Watson on 05/07/2015  at 1030 class time  .

## 2015-05-09 ENCOUNTER — Encounter (HOSPITAL_COMMUNITY)
Admission: RE | Admit: 2015-05-09 | Discharge: 2015-05-09 | Disposition: A | Discharge status: Home / Self Care | Payer: Medicare Other | Source: Ambulatory Visit | Attending: Internal Medicine | Admitting: Internal Medicine

## 2015-05-09 DIAGNOSIS — J449 Chronic obstructive pulmonary disease, unspecified: Secondary | ICD-10-CM | POA: Diagnosis not present

## 2015-05-09 DIAGNOSIS — G4730 Sleep apnea, unspecified: Secondary | ICD-10-CM | POA: Diagnosis not present

## 2015-05-09 NOTE — Progress Notes (Signed)
Today, Shane Watson exercised at [employer] [facility]. Service time was from 10:30am to 12:30pm.  The patient exercised by performing aerobic, strengthening, and stretching exercises. Oxygen saturation, heart rate, blood pressure, rate of perceived exertion, and shortness of breath were all monitored before, during, and after exercise. Shane Watson presented with no problems at today's exercise session. The patient attended education today on Advanced Directives with Jeanella Craze.  The patient did not have an increase in workload intensity during today's exercise session.  Pre-exercise vitals: . Weight kg: 159.2 . Liters of O2: 2 . SpO2: 92 . HR: 80 . BP: 128/60 . CBG: na  Exercise vitals: . Highest heartrate:  107 . Lowest oxygen saturation: 91 . Highest blood pressure: 132/80 . Liters of 02: 2  Post-exercise vitals: . SpO2: 94 . HR: 84 . BP: 120/74 . Liters of O2: 2 . CBG: na  Dr. Brand Males, Medical Director Dr. Wynelle Cleveland is immediately available during today's Pulmonary Rehab session for Shane Watson on 05/09/15 at 10:30am class time.

## 2015-05-14 ENCOUNTER — Encounter (HOSPITAL_COMMUNITY): Payer: Medicare Other

## 2015-05-14 DIAGNOSIS — Z23 Encounter for immunization: Secondary | ICD-10-CM | POA: Diagnosis not present

## 2015-05-14 DIAGNOSIS — E291 Testicular hypofunction: Secondary | ICD-10-CM | POA: Diagnosis not present

## 2015-05-16 ENCOUNTER — Encounter: Payer: Self-pay | Admitting: Internal Medicine

## 2015-05-16 ENCOUNTER — Encounter (HOSPITAL_COMMUNITY): Payer: Medicare Other

## 2015-05-17 ENCOUNTER — Encounter: Payer: Self-pay | Admitting: Internal Medicine

## 2015-05-17 ENCOUNTER — Ambulatory Visit (INDEPENDENT_AMBULATORY_CARE_PROVIDER_SITE_OTHER): Payer: Medicare Other | Admitting: Internal Medicine

## 2015-05-17 VITALS — BP 122/64 | HR 90 | Temp 98.0°F | Ht 71.5 in | Wt 351.0 lb

## 2015-05-17 DIAGNOSIS — J449 Chronic obstructive pulmonary disease, unspecified: Secondary | ICD-10-CM

## 2015-05-17 DIAGNOSIS — J441 Chronic obstructive pulmonary disease with (acute) exacerbation: Secondary | ICD-10-CM | POA: Diagnosis not present

## 2015-05-17 DIAGNOSIS — J9612 Chronic respiratory failure with hypercapnia: Secondary | ICD-10-CM

## 2015-05-17 DIAGNOSIS — I2510 Atherosclerotic heart disease of native coronary artery without angina pectoris: Secondary | ICD-10-CM

## 2015-05-17 MED ORDER — DOXYCYCLINE HYCLATE 100 MG PO TABS: 100.0000 mg | ORAL_TABLET | Freq: Two times a day (BID) | ORAL | Status: DC

## 2015-05-17 MED ORDER — PREDNISONE 10 MG PO TABS: ORAL_TABLET | ORAL | Status: DC

## 2015-05-17 NOTE — Telephone Encounter (Signed)
I have called pt. appt scheduled for today

## 2015-05-17 NOTE — Patient Instructions (Signed)
Prednisone 10 mg take  4 each am x 2 days,   2 each am x 2 days,  1 each am x 2 days and stop   Doxycycline 100 mg Take 30  min before your first and last meals of the day with glass of water   See calendar for specific medication instructions and bring it back for each and every office visit for every healthcare provider you see.  Without it,  you may not receive the best quality medical care that we feel you deserve.  You will note that the calendar groups together  your maintenance  medications that are timed at particular times of the day.  Think of this as your checklist for what your doctor has instructed you to do until your next evaluation to see what benefit  there is  to staying on a consistent group of medications intended to keep you well.  The other group at the bottom is entirely up to you to use as you see fit  for specific symptoms that may arise between visits that require you to treat them on an as needed basis.  Think of this as your action plan or "what if" list.   Separating the top medications from the bottom group is fundamental to providing you adequate care going forward.

## 2015-05-17 NOTE — Progress Notes (Signed)
Subjective:    Patient ID: Shane Watson, male    DOB: 06/28/1946   MRN: [id number]    Brief patient profile:  26 yowm  Quit smoking 1987 at wt = 240   Referred 05/02/2013 by [name] for low 02. 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  Uses alb seems to help some with assoc subj wheeze rec Breo one puff daily automatically to see if breathing improves Please see patient coordinator before you leave today  to schedule overnight 02 sat on cpap> rx 2lpm      02/04/2015 f/u ov/[name] re: Morbid obesity/ GOLD II on sym/spirva respimat no longer using med calendar/ action plan Chief Complaint  Patient presents with  . Follow-up    Pt c/o increased SOB and cough for the past wk "getting better" with taking zpack and pred.      Baseline sob did not change until 10 days  prior to OV  With cough / congestion with dark mucus > rx mucinex  Seen 02/02/15 rec change mucinex dm to mucinex and zpak prednisone 20 x 5 days  Did not use hfa before moving to neb saba as per prev action plan  rec Work on perfecting  inhaler technique     02/25/2015 f/u ov/[name] re: COPD II/ morbid obesity  Chief Complaint  Patient presents with  . Follow-up    Pt states breathing has improved. He is using albuterol 1 x per wk on average.   rec Work on maintaining perfect  inhaler technique:  Please see patient coordinator before you leave today  to schedule referral to rehab - you may need updated pfts we can schedule if needed to qualify for rehab.   Not limited by  breathing from desired activities  But by back/legs - does not alter rx with lasix and has significant edema on lasix 40 mg daily      05/17/2015 acute extended ov/Spiro Ausborn re: aecopd /GOLD II/ MO Chief Complaint  Patient presents with  . Acute Visit    Pt c/o increased cough for the past wk- prod with grey in the am and light yellow throughout the day. Cough seems worse at night. He has also been wheezing and feeling more SOB.     At acute onset of cough x one week prior to OV  started taking mucinex and deslym, not mucinex dm but otherwise following action plan on med calendar  cough prod thick yellow mucus  Comfortable at rest and has not needed neb saba yet  No obvious day to day or daytime variabilty or assoc cp or chest tightness, subjective wheeze overt sinus or hb symptoms. No unusual exp hx or h/o childhood pna/ asthma or knowledge of premature birth.  Sleeping ok without nocturnal  or early am exacerbation  of respiratory  c/o's or need for noct saba. Also denies any obvious fluctuation of symptoms with weather or environmental changes or other aggravating or alleviating factors except as outlined above   Current  Medications, Allergies, Complete Past Medical History, Past Surgical History, Family History, and Social History were reviewed in [employer] record.  ROS  The following are not active complaints unless bolded sore throat, dysphagia, dental problems, itching, sneezing,  nasal congestion or excess/ purulent secretions, ear ache,   fever, chills, sweats, unintended wt loss, pleuritic or exertional cp, hemoptysis,  orthopnea pnd or leg swelling sym bilaterally , presyncope, palpitations, heartburn, abdominal pain, anorexia, nausea, vomiting, diarrhea  or change in bowel or urinary habits, change in stools or urine, dysuria,hematuria,  rash, arthralgias, visual complaints, headache, numbness weakness or ataxia or problems with walking or coordination,  change  in mood/affect or memory.           Objective:   Physical Exam  08/10/2013      352 > 350 08/25/2013 > 10/17/2013 351 > 01/15/2014  345 > 05/08/2014 345 > 08/06/2014  354 > 02/04/2015 354 > 02/25/2015  354 > 05/17/2015 351   amb massively obese wm/ vital signs reviewed    HEENT: nl dentition, turbinates, and orophanx. Nl external ear canals without cough reflex   NECK :  without JVD/Nodes/TM/ nl carotid upstrokes bilaterally   LUNGS: no acc muscle use, clear to A and P bilaterally with distant bs junky insp and exp rhonchi bilaterally     CV:  RRR  no s3 or murmur or increase in P2, wearing TEDS with bilateral  Still 2+ pitting edema with chronic venous stasis changes   ABD:  Massively obese soft and nontender with nl excursion in the supine position. No bruits or organomegaly, bowel sounds nl  MS:  warm without deformities, calf tenderness, cyanosis or clubbing  SKIN: warm and dry without lesions           I personally reviewed images and agree with radiology impression as follows:  CXR:   02/04/15 Bilateral pulmonary hypoinflation with chronically increased bibasilar lung markings. There is no alveolar pneumonia nor evidence of CHF       Assessment & Plan:   Outpatient Encounter Prescriptions as of 05/17/2015  Medication Sig  . acetaminophen (TYLENOL) 325 MG tablet Take 325 mg by mouth every 4 (four) hours as needed for moderate pain. Per bottle as needed for pain  . albuterol (PROVENTIL HFA;VENTOLIN HFA) 108 (90 BASE) MCG/ACT inhaler Inhale 2 puffs into the lungs every 4 (four) hours as needed for wheezing or shortness of breath (((PLAN A))).   Marland Kitchen albuterol (PROVENTIL) (5 MG/ML) 0.5% nebulizer solution Take 2.5 mg by nebulization every 4 (four) hours as needed for wheezing or shortness of breath (((PLAN B))).   Marland Kitchen aspirin 81 MG tablet Take 81 mg by mouth daily.  Marland Kitchen atorvastatin (LIPITOR) 40 MG tablet Take 1 tablet by mouth at  bedtime  . budesonide-formoterol (SYMBICORT)  160-4.5 MCG/ACT inhaler Take 2 puffs first thing in am and then another 2 puffs about 12 hours later.  . cetirizine (ZYRTEC) 10 MG tablet Take 10 mg by mouth every morning.   . colesevelam (WELCHOL) 625 MG tablet Take 1 tablet (625 mg total) by mouth daily with breakfast.  . Dextromethorphan-Guaifenesin (MUCINEX DM MAXIMUM STRENGTH) 60-1200 MG TB12 Take 1,200 mg by mouth 2 (two) times daily.  Marland Kitchen diltiazem (CARDIZEM) 60 MG tablet Take 1 tablet (60 mg total) by mouth 4 (four) times daily as needed.  . doxazosin (CARDURA) 8 MG tablet Take 1 tablet by mouth  daily  . famotidine (PEPCID) 20 MG tablet Take 1 tablet by mouth at  bedtime  . furosemide (LASIX) 20 MG tablet Take 2 tablets by mouth  daily  . gabapentin (NEURONTIN) 100 MG capsule 3 capsules by mouth twice daily  . guaiFENesin (MUCINEX) 600 MG 12 hr tablet Take 600 mg by mouth 2 (two) times daily as needed.  Marland Kitchen losartan (COZAAR) 50 MG tablet Take 1 tablet by mouth  daily  . meclizine (ANTIVERT) 12.5 MG tablet 2 tabs by mouth every morning  . Multiple Vitamin (MULTIVITAMIN WITH MINERALS) TABS tablet Take 1 tablet by mouth at bedtime.   . nitroGLYCERIN (NITROSTAT) 0.4 MG SL tablet Place 1 tablet (0.4 mg total) under the tongue every 5 (five) minutes as needed for chest pain.  . pantoprazole (PROTONIX) 40 MG tablet Take 1 tablet by mouth  daily  . potassium chloride SA (K-DUR,KLOR-CON) 20 MEQ tablet Take 1 tablet by mouth  daily  . Respiratory Therapy Supplies (FLUTTER) DEVI Use as directed  . SPIRIVA RESPIMAT 2.5 MCG/ACT AERS INHALE 2 PUFFS INTO THE LUNGS EVERY MORNING  . doxycycline (VIBRA-TABS) 100 MG tablet Take 1 tablet (100 mg total) by mouth 2 (two) times daily.  . predniSONE (DELTASONE) 10 MG tablet Take  4 each am x 2 days,   2 each am x 2 days,  1 each am x 2 days and stop   No facility-administered encounter medications on file as of 05/17/2015.

## 2015-05-18 ENCOUNTER — Encounter: Payer: Self-pay | Admitting: Internal Medicine

## 2015-05-18 NOTE — Assessment & Plan Note (Signed)
-   started breo 05/02/13 > d/c 06/06/13 and replaced with symbicort   - PFT's 06/06/13  FEV1 1.59 (47%) ratio 62 and 13% better despite  breo am ov test, DLC0 82 corrects to 113% - med calendar .08/25/2013  - 02/04/2015 p extensive coaching HFA effectiveness =  75% - flutter valve added 02/04/15  - 02/25/2015  Walked RA x 3 laps @ 185 ft each stopped due to End of study, nl pace, no desat  / min sob/ some hip and leg pain  - referred to rehab 02/25/2015   The proper method of use, as well as anticipated side effects, of a metered-dose inhaler are discussed and demonstrated to the patient. Improved effectiveness after extensive coaching during this visit to a level of approximately  90%   He is managing this exacerbation very well and not even needing at this point nebulized forms of albuterol because he so good at using the [facility]. I recommended a short course of prednisone and doxy x 10 days as his mucus is quite purulent but he has no signs of pna  I had an extended discussion with the patient reviewing all relevant studies completed to date and  lasting 15 to 20 minutes of a 25 minute visit    Each maintenance medication was reviewed in detail including most importantly the difference between maintenance and prns and under what circumstances the prns are to be triggered using an action plan format that is not reflected in the computer generated alphabetically organized AVS but trather by a customized med calendar that reflects the AVS meds with confirmed 100% correlation.   Please see instructions for details which were reviewed in writing and the patient given a copy highlighting the part that I personally wrote and discussed at today's ov.

## 2015-05-18 NOTE — Assessment & Plan Note (Addendum)
Chronic rx appears adequate on symbicort and spiriva respimat.

## 2015-05-18 NOTE — Assessment & Plan Note (Signed)
-   HCO3 33 02/04/15  - 02/25/2015 to desat's walking   rec as of 02/25/2015 02 2lpm with cpap only at bedtime

## 2015-05-18 NOTE — Assessment & Plan Note (Signed)
Body mass index is 48.28  Trending down slightly in rehab now  Lab Results  Component Value Date   TSH 0.82 02/04/2015     Contributing to gerd tendency/ doe/reviewed the need and the process to achieve and maintain neg calorie balance > defer f/u primary care including intermittently monitoring thyroid status

## 2015-05-21 ENCOUNTER — Encounter (HOSPITAL_COMMUNITY)
Admission: RE | Admit: 2015-05-21 | Discharge: 2015-05-21 | Disposition: A | Discharge status: Home / Self Care | Payer: Medicare Other | Source: Ambulatory Visit | Attending: Internal Medicine | Admitting: Internal Medicine

## 2015-05-21 DIAGNOSIS — G4730 Sleep apnea, unspecified: Secondary | ICD-10-CM | POA: Diagnosis not present

## 2015-05-21 DIAGNOSIS — J449 Chronic obstructive pulmonary disease, unspecified: Secondary | ICD-10-CM | POA: Diagnosis not present

## 2015-05-21 NOTE — Progress Notes (Signed)
Today, Shane Watson exercised at [employer]. Service time was from 1030 to 1215.  The patient exercised by performing aerobic, strengthening, and stretching exercises. Oxygen saturation, heart rate, blood pressure, rate of perceived exertion, and shortness of breath were all monitored before, during, and after exercise. Jaevion presented with no problems at today's exercise session. He was found to be more hypertensive than normal during exercise. He states he is taking a prednisone dose pack and an antibiotic for a respiratory flare up.  The patient did not have an increase in workload intensity during today's exercise session.  Pre-exercise vitals: . Weight kg: 158.1 . Liters of O2: 2L . SpO2: 94 . HR: 68 . BP: 120/60 . CBG: na  Exercise vitals: . Highest heartrate:  110 . Lowest oxygen saturation: 92 . Highest blood pressure: 180/80 . Liters of 02: 2L  Post-exercise vitals: . SpO2: 95 . HR: 70 . BP: 114/66 . Liters of O2: 2L . CBG: na  Dr. Brand Males, Medical Director Dr. Marily Memos is immediately available during today's Pulmonary Rehab session for KEDAR EISLEY on 05/21/2015 at 1030 class time

## 2015-05-23 ENCOUNTER — Encounter (HOSPITAL_COMMUNITY)
Admission: RE | Admit: 2015-05-23 | Discharge: 2015-05-23 | Disposition: A | Discharge status: Home / Self Care | Payer: Medicare Other | Source: Ambulatory Visit | Attending: Internal Medicine | Admitting: Internal Medicine

## 2015-05-23 DIAGNOSIS — G4730 Sleep apnea, unspecified: Secondary | ICD-10-CM | POA: Diagnosis not present

## 2015-05-23 DIAGNOSIS — J449 Chronic obstructive pulmonary disease, unspecified: Secondary | ICD-10-CM | POA: Diagnosis not present

## 2015-05-23 NOTE — Progress Notes (Signed)
Today, Shane Watson exercised at [employer]. Service time was from 1030 to 1230.  The patient exercised by performing aerobic, strengthening, and stretching exercises. Oxygen saturation, heart rate, blood pressure, rate of perceived exertion, and shortness of breath were all monitored before, during, and after exercise. Shane Watson presented with no problems at today's exercise session. Shane Watson also attended an education session on [employer].  The patient did not have an increase in workload intensity during today's exercise session.  Pre-exercise vitals: . Weight kg: 158.2 . Liters of O2: 2L . SpO2: 94 . HR: 84 . BP: 142/62 . CBG: na  Exercise vitals: . Highest heartrate:  99 . Lowest oxygen saturation: 91 . Highest blood pressure: 162/82 . Liters of 02: 2L  Post-exercise vitals: . SpO2: 93 . HR: 73 . BP: 114/60 . Liters of O2: 2L . CBG: na  Dr. Brand Males, Medical Director Dr. Marily Memos is immediately available during today's Pulmonary Rehab session for Shane Watson on 05/23/2015 at 1030 class time

## 2015-05-27 ENCOUNTER — Ambulatory Visit (INDEPENDENT_AMBULATORY_CARE_PROVIDER_SITE_OTHER): Payer: Medicare Other | Admitting: Internal Medicine

## 2015-05-27 ENCOUNTER — Encounter: Payer: Self-pay | Admitting: Internal Medicine

## 2015-05-27 ENCOUNTER — Ambulatory Visit: Payer: Medicare Other | Admitting: Internal Medicine

## 2015-05-27 VITALS — BP 132/60 | HR 81 | Ht 71.0 in | Wt 349.0 lb

## 2015-05-27 DIAGNOSIS — J449 Chronic obstructive pulmonary disease, unspecified: Secondary | ICD-10-CM | POA: Diagnosis not present

## 2015-05-27 DIAGNOSIS — J9612 Chronic respiratory failure with hypercapnia: Secondary | ICD-10-CM

## 2015-05-27 DIAGNOSIS — I2510 Atherosclerotic heart disease of native coronary artery without angina pectoris: Secondary | ICD-10-CM

## 2015-05-27 NOTE — Patient Instructions (Addendum)
See calendar for specific medication instructions and bring it back for each and every office visit for every healthcare provider you see.  Without it,  you may not receive the best quality medical care that we feel you deserve.  You will note that the calendar groups together  your maintenance  medications that are timed at particular times of the day.  Think of this as your checklist for what your doctor has instructed you to do until your next evaluation to see what benefit  there is  to staying on a consistent group of medications intended to keep you well.  The other group at the bottom is entirely up to you to use as you see fit  for specific symptoms that may arise between visits that require you to treat them on an as needed basis.  Think of this as your action plan or "what if" list.   Separating the top medications from the bottom group is fundamental to providing you adequate care going forward.    Use the flutter valve as much as possible  Please schedule a follow up visit in 3 months but call sooner if needed

## 2015-05-27 NOTE — Assessment & Plan Note (Signed)
Body mass index is 48.7 - trending back up slightly   Lab Results  Component Value Date   TSH 0.82 02/04/2015     Contributing to gerd tendency/ doe/reviewed the need and the process to achieve and maintain neg calorie balance > defer f/u primary care including intermittently monitoring thyroid status

## 2015-05-27 NOTE — Progress Notes (Signed)
Subjective:    Patient ID: Shane Watson, male    DOB: 1945/12/09   MRN: [id number]    Brief patient profile:  26 yowm  Quit smoking 1987 at wt = 240   Referred 05/02/2013 by Shane Watson for low 02. 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  Uses alb seems to help some with assoc subj wheeze rec Breo one puff daily automatically to see if breathing improves Please see patient coordinator before you leave today  to schedule overnight 02 sat on cpap> rx 2lpm      02/04/2015 f/u ov/Shane Watson re: Morbid obesity/ GOLD II on sym/spirva respimat no longer using med calendar/ action plan Chief Complaint  Patient presents with  . Follow-up    Pt c/o increased SOB and cough for the past wk "getting better" with taking zpack and pred.     Baseline sob did not change until 10 days  prior to OV  With cough / congestion with dark mucus > rx mucinex  Seen 02/02/15 rec change mucinex dm to mucinex and zpak prednisone 20 x 5 days  Did not use hfa before moving to neb saba as per prev action plan  rec Work on perfecting  inhaler technique      05/17/2015 acute extended ov/Shane Watson re: aecopd /GOLD II/ MO Chief Complaint  Patient presents with  . Acute Visit    Pt c/o increased cough for the past wk- prod with grey in the am and light yellow throughout the day. Cough seems worse at night. He has also been wheezing and feeling more SOB.   At acute onset of cough x one week prior to OV  started taking mucinex and deslym, not mucinex dm but otherwise following  action plan on med calendar  cough prod thick yellow mucus  Comfortable at rest and has not needed neb saba yet rec Prednisone 10 mg take  4 each am x 2 days,   2 each am x 2 days,  1 each am x 2 days and stop  Doxycycline 100 mg Take 30  min before your first and last meals of the day with glass of water  See calendar for specific medication instructions and bring it back for each and every office visit   05/27/2015  f/u ov/Shane Watson re:  COPD II f.u plus MO  Chief Complaint  Patient presents with  . Follow-up    Pt states cough is much improved- sputum back to clear and his breathing is back to his normal baseline. No new co's today.   doe = MMRC2 = can't walk a nl pace on a flat grade s sob/ some am congestion /rattling and has flutter but not using       No obvious day to day or daytime variabilty or assoc cp or chest tightness, subjective wheeze overt sinus or hb symptoms. No unusual exp hx or h/o childhood pna/ asthma or knowledge of premature birth.  Sleeping ok without nocturnal  or early am  exacerbation  of respiratory  c/o's or need for noct saba. Also denies any obvious fluctuation of symptoms with weather or environmental changes or other aggravating or alleviating factors except as outlined above   Current Medications, Allergies, Complete Past Medical History, Past Surgical History, Family History, and Social History were reviewed in [employer] record.  ROS  The following are not active complaints unless bolded sore throat, dysphagia, dental problems, itching, sneezing,  nasal congestion or excess/ purulent secretions, ear ache,   fever, chills, sweats, unintended wt loss, pleuritic or exertional cp, hemoptysis,  orthopnea pnd or leg swelling sym bilaterally , presyncope, palpitations, heartburn, abdominal pain, anorexia, nausea, vomiting, diarrhea  or change in bowel or urinary habits, change in stools or urine, dysuria,hematuria,  rash, arthralgias, visual  complaints, headache, numbness weakness or ataxia or problems with walking or coordination,  change in mood/affect or memory.           Objective:   Physical Exam  08/10/2013      352 > 350 08/25/2013 > 10/17/2013 351 > 01/15/2014  345 > 05/08/2014 345 > 08/06/2014  354 > 02/04/2015 354 > 02/25/2015  354 > 05/17/2015 351 > 05/27/2015  349    amb massively obese wm/ vital signs reviewed    HEENT: nl dentition, turbinates, and orophanx. Nl external ear canals without cough reflex   NECK :  without JVD/Nodes/TM/ nl carotid upstrokes bilaterally   LUNGS: no acc muscle use, clear to A and P bilaterally though bs are distant   CV:  RRR  no s3 or murmur or increase in P2, wearing TEDS with bilateral  Still 2+ pitting edema with chronic venous stasis changes   ABD:  Massively obese soft and nontender with nl excursion in the supine position. No bruits or organomegaly, bowel sounds nl  MS:  warm without deformities, calf tenderness, cyanosis or clubbing  SKIN: warm and dry without lesions           I personally reviewed images and agree with radiology impression as follows:  CXR:   02/04/15 Bilateral pulmonary hypoinflation with chronically increased bibasilar lung markings. There is no alveolar pneumonia nor evidence of CHF       Assessment & Plan:

## 2015-05-27 NOTE — Assessment & Plan Note (Addendum)
-   started breo 05/02/13 > d/c 06/06/13 and replaced with symbicort   - PFT's 06/06/13  FEV1 1.59 (47%) ratio 62 and 13% better despite  breo am ov test, DLC0 82 corrects to 113% - med calendar .08/25/2013  - 02/04/2015 p extensive coaching HFA effectiveness =  75% - flutter valve added 02/04/15  - 02/25/2015  Walked RA x 3 laps @ 185 ft each stopped due to End of study, nl pace, no desat  / min sob/ some hip and leg pain  - referred to rehab 02/25/2015    Improved p recent exac though not keeping up med calendar as rec and not using flutter valve when he should   I had an extended discussion with the patient reviewing all relevant studies completed to date and  lasting 15 to 20 minutes of a 25 minute visit    Each maintenance medication was reviewed in detail including most importantly the difference between maintenance and prns and under what circumstances the prns are to be triggered using an action plan format that is not reflected in the computer generated alphabetically organized AVS but trather by a customized med calendar that reflects the AVS meds with confirmed 100% correlation.   Please see instructions for details which were reviewed in writing and the patient given a copy highlighting the part that I personally wrote and discussed at today's ov.

## 2015-05-27 NOTE — Assessment & Plan Note (Signed)
-   HCO3 33 02/04/15  - 02/25/2015 no desat's walking   rec as of 05/27/2015  02 2lpm with cpap only at bedtime

## 2015-05-28 ENCOUNTER — Encounter (HOSPITAL_COMMUNITY)
Admission: RE | Admit: 2015-05-28 | Discharge: 2015-05-28 | Disposition: A | Discharge status: Home / Self Care | Payer: Medicare Other | Source: Ambulatory Visit | Attending: Internal Medicine | Admitting: Internal Medicine

## 2015-05-28 DIAGNOSIS — J449 Chronic obstructive pulmonary disease, unspecified: Secondary | ICD-10-CM | POA: Diagnosis not present

## 2015-05-28 DIAGNOSIS — G4730 Sleep apnea, unspecified: Secondary | ICD-10-CM | POA: Diagnosis not present

## 2015-05-28 NOTE — Progress Notes (Signed)
Today, Yousuf exercised at [employer]. Service time was from 1030 to 1205.  The patient exercised by performing aerobic, strengthening, and stretching exercises. Oxygen saturation, heart rate, blood pressure, rate of perceived exertion, and shortness of breath were all monitored before, during, and after exercise. Ludovic presented with no problems at today's exercise session.  The patient did not have an increase in workload intensity during today's exercise session.  Pre-exercise vitals: . Weight kg: 158.5 . Liters of O2: 2 . SpO2: 94 . HR: 73 . BP: 148/68 . CBG: NA  Exercise vitals: . Highest heartrate:  102 . Lowest oxygen saturation: 93 . Highest blood pressure: 124/60 . Liters of 02: 2  Post-exercise vitals: . SpO2: 95 . HR: 83 . BP: 104/62 . Liters of O2: 2 . CBG: NA Dr. Brand Males, Medical Director Dr. Ree Kida is immediately available during today's Pulmonary Rehab session for Alyson Locket on 05/28/2015  at 1030 class time.  Marland Kitchen

## 2015-05-30 ENCOUNTER — Encounter (HOSPITAL_COMMUNITY)
Admission: RE | Admit: 2015-05-30 | Discharge: 2015-05-30 | Disposition: A | Discharge status: Home / Self Care | Payer: Medicare Other | Source: Ambulatory Visit | Attending: Internal Medicine | Admitting: Internal Medicine

## 2015-05-30 DIAGNOSIS — J449 Chronic obstructive pulmonary disease, unspecified: Secondary | ICD-10-CM | POA: Diagnosis not present

## 2015-05-30 DIAGNOSIS — G4730 Sleep apnea, unspecified: Secondary | ICD-10-CM | POA: Diagnosis not present

## 2015-05-30 NOTE — Progress Notes (Signed)
Today, Shane Watson exercised at [employer] [facility]. Service time was from 10:30am to 12:05pm.  The patient exercised by performing aerobic, strengthening, and stretching exercises. Oxygen saturation, heart rate, blood pressure, rate of perceived exertion, and shortness of breath were all monitored before, during, and after exercise. Shane Watson presented with no problems at today's exercise session. Patient attended education today on COPD.   The patient did have an increase in workload intensity during today's exercise session.  Pre-exercise vitals: . Weight kg: 159.0 . Liters of O2: 2 . SpO2: 93 . HR: 80 . BP: 144/80 . CBG: na  Exercise vitals: . Highest heartrate:  106 . Lowest oxygen saturation: 91 . Highest blood pressure: 164/80 . Liters of 02: 2  Post-exercise vitals: . SpO2: 94 . HR: 82 . BP: 128/80 . Liters of O2: 2 . CBG: na  Dr. Brand Males, Medical Director Dr. Marily Memos is immediately available during today's Pulmonary Rehab session for Alyson Locket on 05/30/15 at 10:30am class time

## 2015-06-04 ENCOUNTER — Encounter (HOSPITAL_COMMUNITY)
Admission: RE | Admit: 2015-06-04 | Discharge: 2015-06-04 | Disposition: A | Discharge status: Home / Self Care | Payer: Medicare Other | Source: Ambulatory Visit | Attending: Internal Medicine | Admitting: Internal Medicine

## 2015-06-04 DIAGNOSIS — E291 Testicular hypofunction: Secondary | ICD-10-CM | POA: Diagnosis not present

## 2015-06-04 DIAGNOSIS — G4730 Sleep apnea, unspecified: Secondary | ICD-10-CM | POA: Diagnosis not present

## 2015-06-04 DIAGNOSIS — J449 Chronic obstructive pulmonary disease, unspecified: Secondary | ICD-10-CM | POA: Diagnosis not present

## 2015-06-04 NOTE — Progress Notes (Signed)
Today, [name] exercised at [employer] [facility]. Service time was from 1030 to 1200.  The patient exercised by performing aerobic, strengthening, and stretching exercises. Oxygen saturation, heart rate, blood pressure, rate of perceived exertion, and shortness of breath were all monitored before, during, and after exercise. Shane Watson presented with no problems at today's exercise session.  The patient did not have an increase in workload intensity during today's exercise session.  Pre-exercise vitals: . Weight kg: 156.6 . Liters of O2: 2L . SpO2: 91 . HR: 78 . BP: 142/78 . CBG: na  Exercise vitals: . Highest heartrate:  106 . Lowest oxygen saturation: 93 . Highest blood pressure: 104/70 . Liters of 02: ra  Post-exercise vitals: . SpO2: 94 . HR: 82 . BP: 124/64 . Liters of O2: 2L . CBG: na  Dr. Brand Males, Medical Director Dr. Marily Memos is immediately available during today's Pulmonary Rehab session for Shane Watson on 06/04/2015 at 1030 class time

## 2015-06-06 ENCOUNTER — Encounter (HOSPITAL_COMMUNITY)
Admission: RE | Admit: 2015-06-06 | Discharge: 2015-06-06 | Disposition: A | Discharge status: Home / Self Care | Payer: Medicare Other | Source: Ambulatory Visit | Attending: Internal Medicine | Admitting: Internal Medicine

## 2015-06-06 DIAGNOSIS — J449 Chronic obstructive pulmonary disease, unspecified: Secondary | ICD-10-CM | POA: Diagnosis not present

## 2015-06-06 DIAGNOSIS — G4730 Sleep apnea, unspecified: Secondary | ICD-10-CM | POA: Diagnosis not present

## 2015-06-06 NOTE — Progress Notes (Signed)
Shane Watson 69 y.o. male Nutrition Note Spoke with pt. Pt is obese.  There are some ways the pt can make his eating habits healthier. Pt's Rate Your Plate results reviewed with pt. Pt eats out frequently "at home cooking style restaurants" and therefore can not avoid salty foodThe role of sodium in lung disease reviewed with pt. Pt's A1c 15 months ago noted and discussed with pt. Pt reports his A1c was re-checked in April and "the doctor said it was ok." Pt aware of the need to monitor A1c with his PCP due to strong family h/o DM. Pt expressed understanding of the information reviewed.   Lab Results  Component Value Date   HGBA1C 6.7* 02/20/2014   Nutrition Diagnosis ? Excessive sodium intake related to over consumption of processed food as evidenced by frequent consumption of convenience food/ canned vegetables and eating out frequently. ? Food-and nutrition-related knowledge deficit related to lack of exposure to information as related to diagnosis of pulmonary disease ? Obesity related to excessive energy intake as evidenced by a BMI of 45.2 ?  Nutrition Rx/Est. Daily Nutrition Needs for: ? wt loss 2200-2700 Kcal  120-145 gm protein   1500 mg or less sodium  Nutrition Intervention ? Pt's individual nutrition plan and goals reviewed with pt. ? Benefits of adopting healthy eating habits discussed when pt's Rate Your Plate reviewed. ? Pt to attend the Nutrition and Lung Disease class ? Continual client-centered nutrition education by RD, as part of interdisciplinary care. Goal(s) 1. Identify food quantities necessary to achieve wt loss of  -2# per week to a goal wt of 136.3-144.5 kg (300-318 lb) at graduation from pulmonary rehab. 2. Describe the benefit of including fruits, vegetables, whole grains, and low-fat dairy products in a healthy meal plan. Monitor and Evaluate progress toward nutrition goal with team.   Derek Mound, M.Ed, RD, LDN, CDE 05-06-2015 12:05 PM

## 2015-06-06 NOTE — Progress Notes (Signed)
Today, Shane Watson exercised at [facility] [facility]. Service time was from 1030 to 1215.  The patient exercised by performing aerobic, strengthening, and stretching exercises. Oxygen saturation, heart rate, blood pressure, rate of perceived exertion, and shortness of breath were all monitored before, during, and after exercise. Cruze presented with no problems at today's exercise session. Verley also attended an education session on exercise for the pulmonary patient.  The patient did not have an increase in workload intensity during today's exercise session.  Pre-exercise vitals: . Weight kg: 155.7 . Liters of O2: 2L . SpO2: 94 . HR: 74 . BP: 144/70 . CBG: na  Exercise vitals: . Highest heartrate:  80 . Lowest oxygen saturation: 96 . Highest blood pressure: 142/66 . Liters of 02: 2L  Post-exercise vitals: . SpO2: 90 . HR: 74 . BP: 114/72 . Liters of O2: 2L . CBG: na  Dr. Brand Males, Medical Director Dr. Cruzita Lederer is immediately available during today's Pulmonary Rehab session for ALEXENDER SEELBACH on 2020-07-1414 at 1030 class time.

## 2015-06-11 ENCOUNTER — Encounter (HOSPITAL_COMMUNITY)
Admission: RE | Admit: 2015-06-11 | Discharge: 2015-06-11 | Disposition: A | Discharge status: Home / Self Care | Payer: Medicare Other | Source: Ambulatory Visit | Attending: Internal Medicine | Admitting: Internal Medicine

## 2015-06-11 DIAGNOSIS — H2513 Age-related nuclear cataract, bilateral: Secondary | ICD-10-CM | POA: Diagnosis not present

## 2015-06-11 DIAGNOSIS — H524 Presbyopia: Secondary | ICD-10-CM | POA: Diagnosis not present

## 2015-06-11 DIAGNOSIS — H52223 Regular astigmatism, bilateral: Secondary | ICD-10-CM | POA: Diagnosis not present

## 2015-06-11 DIAGNOSIS — G4730 Sleep apnea, unspecified: Secondary | ICD-10-CM | POA: Diagnosis not present

## 2015-06-11 DIAGNOSIS — H5203 Hypermetropia, bilateral: Secondary | ICD-10-CM | POA: Diagnosis not present

## 2015-06-11 DIAGNOSIS — J449 Chronic obstructive pulmonary disease, unspecified: Secondary | ICD-10-CM | POA: Diagnosis not present

## 2015-06-11 NOTE — Progress Notes (Signed)
Today, Shane Watson exercised at [facility]. Service time was from 1030 to 1215.  The patient exercised by performing aerobic, strengthening, and stretching exercises. Oxygen saturation, heart rate, blood pressure, rate of perceived exertion, and shortness of breath were all monitored before, during, and after exercise. Shane Watson presented with no problems at today's exercise session.He attended oxygen safety class today.  The patient did not have an increase in workload intensity during today's exercise session.  Pre-exercise vitals: . Weight kg: 156.8 . Liters of O2: 2 . SpO2: 92 . HR: 70 . BP: 108/70 . CBG: NA  Exercise vitals: . Highest heartrate:  104 . Lowest oxygen saturation: 93 . Highest blood pressure: 140/84 . Liters of 02: 2  Post-exercise vitals: . SpO2: 93 . HR: 76 . BP: 114/64 . Liters of O2: 2 . CBG: NA Dr. Brand Males, Medical Director Dr. Tana Coast is immediately available during today's Pulmonary Rehab session for Shane Watson on 06/11/2015  at 1030 class time.  Marland Kitchen

## 2015-06-13 ENCOUNTER — Encounter (HOSPITAL_COMMUNITY): Payer: Medicare Other

## 2015-06-14 DIAGNOSIS — R609 Edema, unspecified: Secondary | ICD-10-CM | POA: Diagnosis not present

## 2015-06-14 DIAGNOSIS — J441 Chronic obstructive pulmonary disease with (acute) exacerbation: Secondary | ICD-10-CM | POA: Diagnosis not present

## 2015-06-14 DIAGNOSIS — I8310 Varicose veins of unspecified lower extremity with inflammation: Secondary | ICD-10-CM | POA: Diagnosis not present

## 2015-06-18 ENCOUNTER — Encounter (HOSPITAL_COMMUNITY)
Admission: RE | Admit: 2015-06-18 | Discharge: 2015-06-18 | Disposition: A | Discharge status: Home / Self Care | Payer: Medicare Other | Source: Ambulatory Visit | Attending: Internal Medicine | Admitting: Internal Medicine

## 2015-06-18 DIAGNOSIS — J449 Chronic obstructive pulmonary disease, unspecified: Secondary | ICD-10-CM | POA: Diagnosis not present

## 2015-06-18 DIAGNOSIS — G4730 Sleep apnea, unspecified: Secondary | ICD-10-CM | POA: Diagnosis not present

## 2015-06-18 NOTE — Progress Notes (Signed)
Today, Abbott exercised at [facility]. Service time was from 10:30am to 12:15pm.  The patient exercised by performing aerobic, strengthening, and stretching exercises. Oxygen saturation, heart rate, blood pressure, rate of perceived exertion, and shortness of breath were all monitored before, during, and after exercise. Shane Watson presented with no problems at today's exercise session.  The patient did not have an increase in workload intensity during today's exercise session.  Pre-exercise vitals: . Weight kg: 157.8 . Liters of O2: ra . SpO2: 89 . HR: 57 . BP: 154/62 . CBG: na  Exercise vitals: . Highest heartrate:  112 . Lowest oxygen saturation: 92 . Highest blood pressure: 158/78 . Liters of 02: 2  Post-exercise vitals: . SpO2: 93 . HR: 60 . BP: 112/60 . Liters of O2: 2 . CBG: na  Dr. Brand Males, Medical Director Dr. Frederic Jericho is immediately available during today's Pulmonary Rehab session for Alyson Locket on 06/18/15 at 10:30am class time.

## 2015-06-19 ENCOUNTER — Encounter: Payer: Self-pay | Admitting: Internal Medicine

## 2015-06-20 ENCOUNTER — Encounter (HOSPITAL_COMMUNITY)
Admission: RE | Admit: 2015-06-20 | Discharge: 2015-06-20 | Disposition: A | Discharge status: Home / Self Care | Payer: Medicare Other | Source: Ambulatory Visit | Attending: Internal Medicine | Admitting: Internal Medicine

## 2015-06-20 DIAGNOSIS — G4730 Sleep apnea, unspecified: Secondary | ICD-10-CM | POA: Diagnosis not present

## 2015-06-20 DIAGNOSIS — J449 Chronic obstructive pulmonary disease, unspecified: Secondary | ICD-10-CM | POA: Insufficient documentation

## 2015-06-20 NOTE — Progress Notes (Addendum)
Today, Shane Watson exercised at [facility]. Service time was from 1030 to 1210.  The patient exercised by performing aerobic, strengthening, and stretching exercises. Oxygen saturation, heart rate, blood pressure, rate of perceived exertion, and shortness of breath were all monitored before, during, and after exercise. Shane Watson presented with no problems at today's exercise session. Shane Watson also attended an education session on warning signs and symptoms.  The patient did not have an increase in workload intensity during today's exercise session.  Pre-exercise vitals: . Weight kg: 156.2 . Liters of O2: ra . SpO2: 91 . HR: 81 . BP: 128/50 . CBG: na  Exercise vitals: . Highest heartrate:  115 . Lowest oxygen saturation: 91 . Highest blood pressure: 140/62 . Liters of 02: 2L  Post-exercise vitals: . SpO2: 93 . HR: 85 . BP: 126/60 . Liters of O2: 2L . CBG: na  Dr. Rush Farmer, Medical Director Dr. Eliseo Squires is immediately available during today's Pulmonary Rehab session for Shane Watson on 06/20/2015 at 1030 class time

## 2015-06-25 ENCOUNTER — Encounter (HOSPITAL_COMMUNITY)
Admission: RE | Admit: 2015-06-25 | Discharge: 2015-06-25 | Disposition: A | Discharge status: Home / Self Care | Payer: Medicare Other | Source: Ambulatory Visit | Attending: Internal Medicine | Admitting: Internal Medicine

## 2015-06-25 DIAGNOSIS — J449 Chronic obstructive pulmonary disease, unspecified: Secondary | ICD-10-CM | POA: Diagnosis not present

## 2015-06-25 DIAGNOSIS — G4730 Sleep apnea, unspecified: Secondary | ICD-10-CM | POA: Diagnosis not present

## 2015-06-25 NOTE — Progress Notes (Signed)
Today, Shane Watson exercised at [facility]. Service time was from 1030 to 1210.  The patient exercised by performing aerobic, strengthening, and stretching exercises. Oxygen saturation, heart rate, blood pressure, rate of perceived exertion, and shortness of breath were all monitored before, during, and after exercise. Shane Watson presented with no problems at today's exercise session.  The patient did  have an increase in workload intensity during today's exercise session.  Pre-exercise vitals: . Weight kg: 155.9 . Liters of O2: 2 . SpO2: 94 . HR: 85 . BP: 142/64 . CBG: na  Exercise vitals: . Highest heartrate:  113 . Lowest oxygen saturation: 91 . Highest blood pressure: 136/80 . Liters of 02: 2  Post-exercise vitals: . SpO2: 95 . HR: 90 . BP: 126/60 . Liters of O2: 2 . CBG: na Dr. Rush Farmer, Medical Director Dr. Marily Memos is immediately available during today's Pulmonary Rehab session for Shane Watson on 06/25/2015  at 1030 class time  .

## 2015-06-27 ENCOUNTER — Encounter (HOSPITAL_COMMUNITY): Payer: Medicare Other

## 2015-07-02 ENCOUNTER — Encounter (HOSPITAL_COMMUNITY): Payer: Medicare Other

## 2015-07-03 DIAGNOSIS — Z125 Encounter for screening for malignant neoplasm of prostate: Secondary | ICD-10-CM | POA: Diagnosis not present

## 2015-07-03 DIAGNOSIS — J449 Chronic obstructive pulmonary disease, unspecified: Secondary | ICD-10-CM | POA: Diagnosis not present

## 2015-07-03 DIAGNOSIS — N400 Enlarged prostate without lower urinary tract symptoms: Secondary | ICD-10-CM | POA: Diagnosis not present

## 2015-07-03 DIAGNOSIS — E291 Testicular hypofunction: Secondary | ICD-10-CM | POA: Diagnosis not present

## 2015-07-03 DIAGNOSIS — I10 Essential (primary) hypertension: Secondary | ICD-10-CM | POA: Diagnosis not present

## 2015-07-03 DIAGNOSIS — E785 Hyperlipidemia, unspecified: Secondary | ICD-10-CM | POA: Diagnosis not present

## 2015-07-03 DIAGNOSIS — N183 Chronic kidney disease, stage 3 (moderate): Secondary | ICD-10-CM | POA: Diagnosis not present

## 2015-07-03 DIAGNOSIS — K219 Gastro-esophageal reflux disease without esophagitis: Secondary | ICD-10-CM | POA: Diagnosis not present

## 2015-07-04 ENCOUNTER — Encounter (HOSPITAL_COMMUNITY)
Admission: RE | Admit: 2015-07-04 | Discharge: 2015-07-04 | Disposition: A | Discharge status: Home / Self Care | Payer: Medicare Other | Source: Ambulatory Visit | Attending: Internal Medicine | Admitting: Internal Medicine

## 2015-07-04 NOTE — Progress Notes (Signed)
Shuban stayed for the education class today, which was Advanced Directives with Jeanella Craze.  He did not stay to exercise due to a stomach virus he had earlier this week and should be free of symptoms for 48 hours.

## 2015-07-09 ENCOUNTER — Encounter (HOSPITAL_COMMUNITY)
Admission: RE | Admit: 2015-07-09 | Discharge: 2015-07-09 | Disposition: A | Discharge status: Home / Self Care | Payer: Medicare Other | Source: Ambulatory Visit | Attending: Internal Medicine | Admitting: Internal Medicine

## 2015-07-09 DIAGNOSIS — J449 Chronic obstructive pulmonary disease, unspecified: Secondary | ICD-10-CM | POA: Diagnosis not present

## 2015-07-09 DIAGNOSIS — G4730 Sleep apnea, unspecified: Secondary | ICD-10-CM | POA: Diagnosis not present

## 2015-07-09 NOTE — Progress Notes (Signed)
Today, Zaccheaus exercised at [facility]. Service time was from 10:30am to 12:10pm.  The patient exercised by performing aerobic, strengthening, and stretching exercises. Oxygen saturation, heart rate, blood pressure, rate of perceived exertion, and shortness of breath were all monitored before, during, and after exercise. Shane Watson presented with no problems at today's exercise session.  The patient did not have an increase in workload intensity during today's exercise session.  Pre-exercise vitals: . Weight kg: 156.3 . Liters of O2: 2 . SpO2: 92 . HR: 65 . BP: 140/56 . CBG: na  Exercise vitals: . Highest heartrate:  98 . Lowest oxygen saturation: 93 . Highest blood pressure: 144/60 . Liters of 02: 3  Post-exercise vitals: . SpO2: 95 . HR: 75 . BP: 138/80 . Liters of O2: 2 . CBG: na  Dr. Rush Farmer, Medical Director Dr. Marily Memos is immediately available during today's Pulmonary Rehab session for Alyson Locket on 07/09/15 at 10:30am class time

## 2015-07-09 NOTE — Progress Notes (Signed)
Today, Junior exercised at [facility]. Service time was from 1030 to 1210.  The patient exercised by performing aerobic, strengthening, and stretching exercises. Oxygen saturation, heart rate, blood pressure, rate of perceived exertion, and shortness of breath were all monitored before, during, and after exercise. Shane Watson presented with no problems at today's exercise session.  The patient did not have an increase in workload intensity during today's exercise session.  Pre-exercise vitals: . Weight kg: 81.1 . Liters of O2: RA . SpO2: 95 . HR: 59 . BP: 142/60 . CBG: NA  Exercise vitals: . Highest heartrate:  110 . Lowest oxygen saturation: 97 . Highest blood pressure: 144/70 . Liters of 02: RA  Post-exercise vitals: . SpO2: 95 . HR: 88 . BP: 104/60 . Liters of O2: RA . CBG: NA Dr. Rush Farmer, Medical Director Dr. Marily Memos is immediately available during today's Pulmonary Rehab session for Alyson Locket on 07/09/2015  at 1030 class time  .

## 2015-07-10 DIAGNOSIS — N5201 Erectile dysfunction due to arterial insufficiency: Secondary | ICD-10-CM | POA: Diagnosis not present

## 2015-07-10 DIAGNOSIS — N400 Enlarged prostate without lower urinary tract symptoms: Secondary | ICD-10-CM | POA: Diagnosis not present

## 2015-07-10 DIAGNOSIS — E291 Testicular hypofunction: Secondary | ICD-10-CM | POA: Diagnosis not present

## 2015-07-10 DIAGNOSIS — N281 Cyst of kidney, acquired: Secondary | ICD-10-CM | POA: Diagnosis not present

## 2015-07-11 ENCOUNTER — Encounter (HOSPITAL_COMMUNITY)
Admission: RE | Admit: 2015-07-11 | Discharge: 2015-07-11 | Disposition: A | Discharge status: Home / Self Care | Payer: Medicare Other | Source: Ambulatory Visit | Attending: Internal Medicine | Admitting: Internal Medicine

## 2015-07-11 DIAGNOSIS — G4730 Sleep apnea, unspecified: Secondary | ICD-10-CM | POA: Diagnosis not present

## 2015-07-11 DIAGNOSIS — J449 Chronic obstructive pulmonary disease, unspecified: Secondary | ICD-10-CM | POA: Diagnosis not present

## 2015-07-11 NOTE — Progress Notes (Signed)
Gracin completed a Six-Minute Walk Test on 07/11/15 . Aser walked 1372 feet with 0 breaks.  The patient's lowest oxygen saturation was 89 %, highest heart rate was 83 bpm , and highest blood pressure was 180/72. The patient was on 2 liters of oxygen with a nasal cannula. Patient stated that leg weakness hindered their walk test.

## 2015-07-16 ENCOUNTER — Encounter (HOSPITAL_COMMUNITY): Payer: Medicare Other

## 2015-07-16 NOTE — Progress Notes (Signed)
Pulmonary Rehab Discharge Note: Shane Watson has been discharged from pulmonary rehab after successfully completing 19 exercise and education sessions. Shane Watson increased his stamina and strength while in the program as evidenced by his ability to walk an additional 284 feet on his discharged walk test as compared to his admission. He met all of his personal goals. He has began a healthy weight loss regimen and has lost 13 pounds while in the program. He now utilizes pursed lip breathing with activity to minimize his shortness of breath. His discharge PHQ 2 was 0, down from 1 on admission. Shane Watson plans to continue to exercise at the [facility] or a church post discharge. It was a pleasure to have Shane Watson in the program.

## 2015-07-18 ENCOUNTER — Encounter (HOSPITAL_COMMUNITY): Payer: Medicare Other

## 2015-07-23 ENCOUNTER — Telehealth: Payer: Self-pay | Admitting: Internal Medicine

## 2015-07-23 ENCOUNTER — Other Ambulatory Visit: Payer: Self-pay

## 2015-07-23 ENCOUNTER — Telehealth: Payer: Self-pay | Admitting: Cardiology

## 2015-07-23 ENCOUNTER — Encounter (HOSPITAL_COMMUNITY): Payer: Medicare Other

## 2015-07-23 MED ORDER — BUDESONIDE-FORMOTEROL FUMARATE 160-4.5 MCG/ACT IN AERO: INHALATION_SPRAY | RESPIRATORY_TRACT | Status: DC

## 2015-07-23 MED ORDER — ATORVASTATIN CALCIUM 40 MG PO TABS: ORAL_TABLET | ORAL | Status: DC

## 2015-07-23 NOTE — Telephone Encounter (Signed)
Already filled

## 2015-07-23 NOTE — Telephone Encounter (Signed)
Jerline Pain, MD at 04/08/2015 2:06 PM  atorvastatin (LIPITOR) 40 MG tablet Take 1 tablet by mouth at bedtime Patient Instructions     Medication Instructions:  You may take Diltiazem 60 mg once every 6 hours as needed. Continue all other medications as listed.

## 2015-07-23 NOTE — Telephone Encounter (Signed)
New message       *STAT* If patient is at the pharmacy, call can be transferred to refill team.   1. Which medications need to be refilled? (please list name of each medication and dose if known)  Potassium 74meq, atorvastatin 40mg , doxazosin 8mg , 2. Which pharmacy/location (including street and city if local pharmacy) is medication to be sent to? [facility]  3. Do they need a 30 day or 90 day supply? 90 day supply

## 2015-07-23 NOTE — Telephone Encounter (Signed)
Spoke with pt. States that he needs a refill of symbicort sent to [facility]. This has been sent in. Nothing further was needed.

## 2015-07-24 ENCOUNTER — Telehealth: Payer: Self-pay | Admitting: *Deleted

## 2015-07-24 DIAGNOSIS — R739 Hyperglycemia, unspecified: Secondary | ICD-10-CM | POA: Diagnosis not present

## 2015-07-24 DIAGNOSIS — E291 Testicular hypofunction: Secondary | ICD-10-CM | POA: Diagnosis not present

## 2015-07-24 NOTE — Telephone Encounter (Signed)
Key: [place] Started PA through [facility] Received response will have decision in 1-5 business days. Will forward to leslie

## 2015-07-25 ENCOUNTER — Encounter (HOSPITAL_COMMUNITY): Payer: Medicare Other

## 2015-07-25 ENCOUNTER — Other Ambulatory Visit: Payer: Self-pay | Admitting: Internal Medicine

## 2015-07-25 ENCOUNTER — Other Ambulatory Visit: Payer: Self-pay | Admitting: Cardiology

## 2015-07-25 MED ORDER — FAMOTIDINE 20 MG PO TABS: ORAL_TABLET | ORAL | Status: DC

## 2015-07-25 MED ORDER — ATORVASTATIN CALCIUM 40 MG PO TABS: ORAL_TABLET | ORAL | Status: DC

## 2015-07-25 NOTE — Telephone Encounter (Signed)
Received fax that Symbicort is covered under pt's prescription plan. No PA is required. Called pharmacy. They states rx went through. Nothing further needed.

## 2015-07-30 ENCOUNTER — Encounter (HOSPITAL_COMMUNITY): Payer: Medicare Other

## 2015-08-01 ENCOUNTER — Encounter (HOSPITAL_COMMUNITY): Payer: Medicare Other

## 2015-08-06 ENCOUNTER — Encounter (HOSPITAL_COMMUNITY): Payer: Medicare Other

## 2015-08-08 ENCOUNTER — Encounter (HOSPITAL_COMMUNITY): Payer: Medicare Other

## 2015-08-08 DIAGNOSIS — Z7984 Long term (current) use of oral hypoglycemic drugs: Secondary | ICD-10-CM | POA: Diagnosis not present

## 2015-08-08 DIAGNOSIS — E1165 Type 2 diabetes mellitus with hyperglycemia: Secondary | ICD-10-CM | POA: Diagnosis not present

## 2015-08-08 DIAGNOSIS — I10 Essential (primary) hypertension: Secondary | ICD-10-CM | POA: Diagnosis not present

## 2015-08-13 ENCOUNTER — Encounter (HOSPITAL_COMMUNITY): Payer: Medicare Other

## 2015-08-14 DIAGNOSIS — E291 Testicular hypofunction: Secondary | ICD-10-CM | POA: Diagnosis not present

## 2015-08-27 ENCOUNTER — Encounter: Payer: Self-pay | Admitting: Internal Medicine

## 2015-08-27 ENCOUNTER — Ambulatory Visit (INDEPENDENT_AMBULATORY_CARE_PROVIDER_SITE_OTHER): Payer: Medicare Other | Admitting: Internal Medicine

## 2015-08-27 VITALS — BP 126/74 | HR 71 | Ht 71.5 in | Wt 335.0 lb

## 2015-08-27 DIAGNOSIS — J9612 Chronic respiratory failure with hypercapnia: Secondary | ICD-10-CM

## 2015-08-27 DIAGNOSIS — J449 Chronic obstructive pulmonary disease, unspecified: Secondary | ICD-10-CM

## 2015-08-27 NOTE — Patient Instructions (Signed)
No change in medications  Take a food diary with you when you see nutritionist   Please schedule a follow up visit in 6 months but call sooner if needed

## 2015-08-27 NOTE — Progress Notes (Signed)
Subjective:    Patient ID: Shane Watson, male    DOB: June 26, 1946   MRN: [id number]    Brief patient profile:  55 yowm  Quit smoking 1987 at wt = 240   Referred 05/02/2013 by Shane Watson for low 02. 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  Uses alb seems to help some with assoc subj wheeze rec Breo one puff daily automatically to see if breathing improves Please see patient coordinator before you leave today  to schedule overnight 02 sat on cpap> rx 2lpm      02/04/2015 f/u ov/Shane Watson re: Morbid obesity/ GOLD II on sym/spirva respimat no longer using med calendar/ action plan Chief Complaint  Patient presents with  . Follow-up    Pt c/o increased SOB and cough for the past wk "getting better" with taking zpack and pred.     Baseline sob did not change until 10 days  prior to OV  With cough / congestion with dark mucus > rx mucinex  Seen 02/02/15 rec change mucinex dm to mucinex and zpak prednisone 20 x 5 days  Did not use hfa before moving to neb saba as per prev action plan  rec Work on perfecting  inhaler technique      05/17/2015 acute extended ov/Shane Watson re: aecopd /GOLD II/ MO Chief Complaint  Patient presents with  . Acute Visit    Pt c/o increased cough for the past wk- prod with grey in the am and light yellow throughout the day. Cough seems worse at night. He has also been wheezing and feeling more SOB.   At acute onset of cough x one week prior to OV  started taking mucinex and deslym, not mucinex dm but otherwise following  action plan on med calendar  cough prod thick yellow mucus  Comfortable at rest and has not needed neb saba yet rec Prednisone 10 mg take  4 each am x 2 days,   2 each am x 2 days,  1 each am x 2 days and stop  Doxycycline 100 mg Take 30  min before your first and last meals of the day with glass of water  See calendar for specific medication instructions and bring it back for each and every office visit   05/27/2015  f/u ov/Shane Watson re:  COPD II f.u plus MO  Chief Complaint  Patient presents with  . Follow-up    Pt states cough is much improved- sputum back to clear and his breathing is back to his normal baseline. No new co's today.   doe = MMRC2 = can't walk a nl pace on a flat grade s sob/ some am congestion /rattling and has flutter but not using  rec No change / use med calendar    08/27/2015  f/u ov/Shane Watson re: COPD GOLD II plus MO / maint on symb 160/spiriva  Chief Complaint  Patient presents with  . Follow-up    Pt states his breathing is doing well.  He rarely uses  albuterol inhaler or neb.    finally turning the corner with wt gain         No obvious day to day or daytime variabilty or assoc excess/ purulent sputum or mucus plugs   cp or chest tightness, subjective wheeze overt sinus or hb symptoms. No unusual exp hx or h/o childhood pna/ asthma or knowledge of premature birth.  Sleeping ok without nocturnal  or early am exacerbation  of respiratory  c/o's or need for noct saba. Also denies any obvious fluctuation of symptoms with weather or environmental changes or other aggravating or alleviating factors except as outlined above   Current Medications, Allergies, Complete Past Medical History, Past Surgical History, Family History, and Social History were reviewed in [employer] record.  ROS  The following are not active complaints unless bolded sore throat, dysphagia, dental problems, itching, sneezing,  nasal congestion or excess/ purulent secretions, ear  ache,   fever, chills, sweats, unintended wt loss, pleuritic or exertional cp, hemoptysis,  orthopnea pnd or leg swelling sym bilaterally , presyncope, palpitations, heartburn, abdominal pain, anorexia, nausea, vomiting, diarrhea  or change in bowel or urinary habits, change in stools or urine, dysuria,hematuria,  rash, arthralgias, visual complaints, headache, numbness weakness or ataxia or problems with walking or coordination,  change in mood/affect or memory.           Objective:   Physical Exam  08/10/2013      352 > 350 08/25/2013 > 10/17/2013 351 > 01/15/2014  345 > 05/08/2014 345 > 08/06/2014  354 > 02/04/2015 354 > 02/25/2015  354 > 05/17/2015 351 > 05/27/2015  349 > 08/27/2015  335    amb massively obese wm/ vital signs reviewed    HEENT: nl dentition, turbinates, and orophanx. Nl external ear canals without cough reflex   NECK :  without JVD/Nodes/TM/ nl carotid upstrokes bilaterally   LUNGS: no acc muscle use, clear to A and P bilaterally though bs are distant   CV:  RRR  no s3 or murmur or increase in P2, wearing TEDS with bilateral  Still 2+ pitting edema with chronic venous stasis changes   ABD:  Massively obese soft and nontender with nl excursion in the supine position. No bruits or organomegaly, bowel sounds nl  MS:  warm without deformities, calf tenderness, cyanosis or clubbing  SKIN: warm and dry without lesions           I personally reviewed images and agree with radiology impression as follows:  CXR:   02/04/15 Bilateral pulmonary hypoinflation with chronically increased bibasilar lung markings. There is no alveolar pneumonia nor evidence of CHF       Assessment & Plan:

## 2015-08-31 NOTE — Assessment & Plan Note (Signed)
Body mass index is 46.08 trending down  Lab Results  Component Value Date   TSH 0.82 02/04/2015     Contributing to gerd tendency/ doe/reviewed the need and the process to achieve and maintain neg calorie balance> encouraged to take food diary to nutrition  > defer f/u primary care including intermittently monitoring thyroid status

## 2015-08-31 NOTE — Assessment & Plan Note (Addendum)
-   HCO3 33 02/04/15  - 02/25/2015 no desat's walking  - 08/27/2015  Walked RA x 3 laps @ 185 ft each stopped due to  End of study, nl pace, no sob or desat  rec as of 02/25/2015 02 2lpm with cpap only at bedtime

## 2015-08-31 NOTE — Assessment & Plan Note (Signed)
-   started breo 05/02/13 > d/c 06/06/13 and replaced with symbicort   - PFT's 06/06/13  FEV1 1.59 (47%) ratio 62 and 13% better despite  breo am ov test, DLC0 82 corrects to 113% - med calendar    08/25/2013  - 02/04/2015 p extensive coaching HFA effectiveness =  75% - flutter valve added 02/04/15  - 02/25/2015  Walked RA x 3 laps @ 185 ft each stopped due to End of study, nl pace, no desat  / min sob/ some hip and leg pain  - referred to rehab 02/25/2015   - re-inforced flutter valve 05/27/2015   Adequate control on present rx, reviewed > no change in rx needed    I had an extended discussion with the patient reviewing all relevant studies completed to date and  lasting 10  minutes of a 15 minute visit    Each maintenance medication was reviewed in detail including most importantly the difference between maintenance and prns and under what circumstances the prns are to be triggered using an action plan format that is not reflected in the computer generated alphabetically organized AVS.    Please see instructions for details which were reviewed in writing and the patient given a copy highlighting the part that I personally wrote and discussed at today's ov.

## 2015-09-10 ENCOUNTER — Encounter: Payer: Medicare Other | Attending: Family Medicine

## 2015-09-10 VITALS — Ht 71.5 in | Wt 331.7 lb

## 2015-09-10 DIAGNOSIS — E1165 Type 2 diabetes mellitus with hyperglycemia: Secondary | ICD-10-CM | POA: Diagnosis not present

## 2015-09-10 DIAGNOSIS — E119 Type 2 diabetes mellitus without complications: Secondary | ICD-10-CM

## 2015-09-11 NOTE — Progress Notes (Signed)

## 2015-09-12 DIAGNOSIS — H52223 Regular astigmatism, bilateral: Secondary | ICD-10-CM | POA: Diagnosis not present

## 2015-09-12 DIAGNOSIS — H5203 Hypermetropia, bilateral: Secondary | ICD-10-CM | POA: Diagnosis not present

## 2015-09-12 DIAGNOSIS — H16143 Punctate keratitis, bilateral: Secondary | ICD-10-CM | POA: Diagnosis not present

## 2015-09-17 ENCOUNTER — Other Ambulatory Visit: Payer: Self-pay | Admitting: Cardiology

## 2015-09-17 DIAGNOSIS — E119 Type 2 diabetes mellitus without complications: Secondary | ICD-10-CM

## 2015-09-17 DIAGNOSIS — E1165 Type 2 diabetes mellitus with hyperglycemia: Secondary | ICD-10-CM | POA: Diagnosis not present

## 2015-09-17 NOTE — Progress Notes (Signed)

## 2015-09-24 ENCOUNTER — Encounter: Payer: Medicare Other | Attending: Family Medicine

## 2015-09-24 DIAGNOSIS — E1165 Type 2 diabetes mellitus with hyperglycemia: Secondary | ICD-10-CM | POA: Insufficient documentation

## 2015-09-24 DIAGNOSIS — E119 Type 2 diabetes mellitus without complications: Secondary | ICD-10-CM

## 2015-09-24 NOTE — Progress Notes (Signed)
Patient was seen on 09/24/15 for the third of a series of three diabetes self-management courses at the [facility].   Shane Watson the amount of activity recommended for healthy living . Describe activities suitable for individual needs . Identify ways to regularly incorporate activity into daily life . Identify barriers to activity and ways to over come these barriers  Identify diabetes medications being personally used and their primary action for lowering glucose and possible side effects . Describe role of stress on blood glucose and develop strategies to address psychosocial issues . Identify diabetes complications and ways to prevent them  Explain how to manage diabetes during illness . Evaluate success in meeting personal goal . Establish 2-3 goals that they will plan to diligently work on until they return for the  20-month follow-up visit  Goals:   I will count my carb choices at most meals and snacks  I will be active 30 minutes or more 5 times a week  I will take my diabetes medications as scheduled  I will eat less unhealthy fats by eating less   I will test my glucose at least 1 times a day, 7 days a week  I will look at patterns in my record book at least 30 days a month  To help manage stress I will  exercise at least 5 times a week  Your patient has identified these potential barriers to change:  Motivation  Your patient has identified their diabetes self-care support plan as  Family Support

## 2015-10-02 ENCOUNTER — Emergency Department (HOSPITAL_COMMUNITY): Payer: Medicare Other

## 2015-10-02 ENCOUNTER — Emergency Department (HOSPITAL_COMMUNITY)
Admission: EM | Admit: 2015-10-02 | Discharge: 2015-10-03 | Disposition: A | Discharge status: Home / Self Care | Payer: Medicare Other | Attending: Emergency Medicine | Admitting: Emergency Medicine

## 2015-10-02 ENCOUNTER — Encounter (HOSPITAL_COMMUNITY): Payer: Self-pay

## 2015-10-02 DIAGNOSIS — S42201A Unspecified fracture of upper end of right humerus, initial encounter for closed fracture: Secondary | ICD-10-CM | POA: Diagnosis not present

## 2015-10-02 DIAGNOSIS — R011 Cardiac murmur, unspecified: Secondary | ICD-10-CM | POA: Diagnosis not present

## 2015-10-02 DIAGNOSIS — J449 Chronic obstructive pulmonary disease, unspecified: Secondary | ICD-10-CM | POA: Insufficient documentation

## 2015-10-02 DIAGNOSIS — Z880 Allergy status to penicillin: Secondary | ICD-10-CM | POA: Diagnosis not present

## 2015-10-02 DIAGNOSIS — G319 Degenerative disease of nervous system, unspecified: Secondary | ICD-10-CM | POA: Insufficient documentation

## 2015-10-02 DIAGNOSIS — E785 Hyperlipidemia, unspecified: Secondary | ICD-10-CM | POA: Diagnosis not present

## 2015-10-02 DIAGNOSIS — S199XXA Unspecified injury of neck, initial encounter: Secondary | ICD-10-CM | POA: Insufficient documentation

## 2015-10-02 DIAGNOSIS — Z862 Personal history of diseases of the blood and blood-forming organs and certain disorders involving the immune mechanism: Secondary | ICD-10-CM | POA: Insufficient documentation

## 2015-10-02 DIAGNOSIS — N183 Chronic kidney disease, stage 3 (moderate): Secondary | ICD-10-CM | POA: Diagnosis not present

## 2015-10-02 DIAGNOSIS — W101XXA Fall (on)(from) sidewalk curb, initial encounter: Secondary | ICD-10-CM | POA: Insufficient documentation

## 2015-10-02 DIAGNOSIS — E119 Type 2 diabetes mellitus without complications: Secondary | ICD-10-CM | POA: Diagnosis not present

## 2015-10-02 DIAGNOSIS — Y92480 Sidewalk as the place of occurrence of the external cause: Secondary | ICD-10-CM | POA: Insufficient documentation

## 2015-10-02 DIAGNOSIS — E1165 Type 2 diabetes mellitus with hyperglycemia: Secondary | ICD-10-CM | POA: Diagnosis not present

## 2015-10-02 DIAGNOSIS — Z87891 Personal history of nicotine dependence: Secondary | ICD-10-CM | POA: Insufficient documentation

## 2015-10-02 DIAGNOSIS — K219 Gastro-esophageal reflux disease without esophagitis: Secondary | ICD-10-CM | POA: Diagnosis not present

## 2015-10-02 DIAGNOSIS — Y998 Other external cause status: Secondary | ICD-10-CM | POA: Insufficient documentation

## 2015-10-02 DIAGNOSIS — R001 Bradycardia, unspecified: Secondary | ICD-10-CM | POA: Diagnosis not present

## 2015-10-02 DIAGNOSIS — N400 Enlarged prostate without lower urinary tract symptoms: Secondary | ICD-10-CM | POA: Diagnosis not present

## 2015-10-02 DIAGNOSIS — N189 Chronic kidney disease, unspecified: Secondary | ICD-10-CM | POA: Diagnosis not present

## 2015-10-02 DIAGNOSIS — M25511 Pain in right shoulder: Secondary | ICD-10-CM | POA: Diagnosis not present

## 2015-10-02 DIAGNOSIS — S0083XA Contusion of other part of head, initial encounter: Secondary | ICD-10-CM | POA: Diagnosis not present

## 2015-10-02 DIAGNOSIS — Z7984 Long term (current) use of oral hypoglycemic drugs: Secondary | ICD-10-CM | POA: Insufficient documentation

## 2015-10-02 DIAGNOSIS — G609 Hereditary and idiopathic neuropathy, unspecified: Secondary | ICD-10-CM | POA: Diagnosis not present

## 2015-10-02 DIAGNOSIS — G4730 Sleep apnea, unspecified: Secondary | ICD-10-CM | POA: Diagnosis not present

## 2015-10-02 DIAGNOSIS — S0990XA Unspecified injury of head, initial encounter: Secondary | ICD-10-CM | POA: Diagnosis not present

## 2015-10-02 DIAGNOSIS — Z7982 Long term (current) use of aspirin: Secondary | ICD-10-CM | POA: Diagnosis not present

## 2015-10-02 DIAGNOSIS — I10 Essential (primary) hypertension: Secondary | ICD-10-CM | POA: Diagnosis not present

## 2015-10-02 DIAGNOSIS — S0181XA Laceration without foreign body of other part of head, initial encounter: Secondary | ICD-10-CM | POA: Diagnosis not present

## 2015-10-02 DIAGNOSIS — G629 Polyneuropathy, unspecified: Secondary | ICD-10-CM | POA: Diagnosis not present

## 2015-10-02 DIAGNOSIS — R61 Generalized hyperhidrosis: Secondary | ICD-10-CM | POA: Diagnosis not present

## 2015-10-02 DIAGNOSIS — Z79899 Other long term (current) drug therapy: Secondary | ICD-10-CM | POA: Insufficient documentation

## 2015-10-02 DIAGNOSIS — M542 Cervicalgia: Secondary | ICD-10-CM | POA: Diagnosis not present

## 2015-10-02 DIAGNOSIS — S42291A Other displaced fracture of upper end of right humerus, initial encounter for closed fracture: Secondary | ICD-10-CM | POA: Diagnosis not present

## 2015-10-02 DIAGNOSIS — E291 Testicular hypofunction: Secondary | ICD-10-CM | POA: Diagnosis not present

## 2015-10-02 DIAGNOSIS — S42301A Unspecified fracture of shaft of humerus, right arm, initial encounter for closed fracture: Secondary | ICD-10-CM

## 2015-10-02 DIAGNOSIS — Y9389 Activity, other specified: Secondary | ICD-10-CM | POA: Insufficient documentation

## 2015-10-02 DIAGNOSIS — S4991XA Unspecified injury of right shoulder and upper arm, initial encounter: Secondary | ICD-10-CM | POA: Diagnosis present

## 2015-10-02 DIAGNOSIS — Z0000 Encounter for general adult medical examination without abnormal findings: Secondary | ICD-10-CM | POA: Diagnosis not present

## 2015-10-02 DIAGNOSIS — T148 Other injury of unspecified body region: Secondary | ICD-10-CM | POA: Diagnosis not present

## 2015-10-02 DIAGNOSIS — M25551 Pain in right hip: Secondary | ICD-10-CM | POA: Diagnosis not present

## 2015-10-02 DIAGNOSIS — M79621 Pain in right upper arm: Secondary | ICD-10-CM | POA: Diagnosis not present

## 2015-10-02 DIAGNOSIS — S0180XA Unspecified open wound of other part of head, initial encounter: Secondary | ICD-10-CM | POA: Diagnosis not present

## 2015-10-02 DIAGNOSIS — I2510 Atherosclerotic heart disease of native coronary artery without angina pectoris: Secondary | ICD-10-CM | POA: Diagnosis not present

## 2015-10-02 DIAGNOSIS — E7800 Pure hypercholesterolemia, unspecified: Secondary | ICD-10-CM | POA: Diagnosis not present

## 2015-10-02 LAB — CBG MONITORING, ED: Glucose-Capillary: 160 mg/dL — ABNORMAL HIGH (ref 65–99)

## 2015-10-02 LAB — PROTIME-INR
INR: 1.18 (ref 0.00–1.49)
Prothrombin Time: 15.2 seconds (ref 11.6–15.2)

## 2015-10-02 LAB — COMPREHENSIVE METABOLIC PANEL
ALT: 30 U/L (ref 17–63)
ANION GAP: 11 (ref 5–15)
AST: 30 U/L (ref 15–41)
Albumin: 3.9 g/dL (ref 3.5–5.0)
Alkaline Phosphatase: 84 U/L (ref 38–126)
BUN: 19 mg/dL (ref 6–20)
CALCIUM: 9.2 mg/dL (ref 8.9–10.3)
CHLORIDE: 104 mmol/L (ref 101–111)
CO2: 28 mmol/L (ref 22–32)
Creatinine, Ser: 1.34 mg/dL — ABNORMAL HIGH (ref 0.61–1.24)
GFR calc non Af Amer: 52 mL/min — ABNORMAL LOW (ref >60)
Glucose, Bld: 175 mg/dL — ABNORMAL HIGH (ref 65–99)
Potassium: 4.3 mmol/L (ref 3.5–5.1)
SODIUM: 143 mmol/L (ref 135–145)
Total Bilirubin: 1 mg/dL (ref 0.3–1.2)
Total Protein: 6.6 g/dL (ref 6.5–8.1)

## 2015-10-02 LAB — CBC WITH DIFFERENTIAL/PLATELET
Basophils Absolute: 0 10*3/uL (ref 0.0–0.1)
Basophils Relative: 1 %
EOS ABS: 0.1 10*3/uL (ref 0.0–0.7)
EOS PCT: 2 %
HCT: 43.4 % (ref 39.0–52.0)
Hemoglobin: 13.9 g/dL (ref 13.0–17.0)
LYMPHS ABS: 1.5 10*3/uL (ref 0.7–4.0)
Lymphocytes Relative: 25 %
MCH: 27.6 pg (ref 26.0–34.0)
MCHC: 32 g/dL (ref 30.0–36.0)
MCV: 86.1 fL (ref 78.0–100.0)
MONOS PCT: 10 %
Monocytes Absolute: 0.6 10*3/uL (ref 0.1–1.0)
Neutro Abs: 3.8 10*3/uL (ref 1.7–7.7)
Neutrophils Relative %: 62 %
PLATELETS: 97 10*3/uL — AB (ref 150–400)
RBC: 5.04 MIL/uL (ref 4.22–5.81)
RDW: 14.1 % (ref 11.5–15.5)
WBC: 6.1 10*3/uL (ref 4.0–10.5)

## 2015-10-02 MED ORDER — OXYCODONE-ACETAMINOPHEN 5-325 MG PO TABS
2.0000 | ORAL_TABLET | Freq: Once | ORAL | Status: AC
  Administered 2015-10-02: 2 via ORAL
  Filled 2015-10-02: qty 2

## 2015-10-02 MED ORDER — OXYCODONE-ACETAMINOPHEN 5-325 MG PO TABS: 2.0000 | ORAL_TABLET | Freq: Three times a day (TID) | ORAL | Status: DC | PRN

## 2015-10-02 MED ORDER — HYDROMORPHONE HCL 1 MG/ML IJ SOLN
1.0000 mg | Freq: Once | INTRAMUSCULAR | Status: AC
  Administered 2015-10-02: 1 mg via INTRAVENOUS
  Filled 2015-10-02: qty 1

## 2015-10-02 NOTE — ED Notes (Signed)
Patient just returned from CT via stretcher.

## 2015-10-02 NOTE — Progress Notes (Signed)
Orthopedic Tech Progress Note Patient Details:  Shane Watson 1946-05-30 [id number]  Ortho Devices Type of Ortho Device: Shoulder immobilizer Ortho Device/Splint Location: RUE Ortho Device/Splint Interventions: Ordered, Application   Braulio Bosch 10/02/2015, 9:06 PM
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
## 2015-10-02 NOTE — ED Notes (Signed)
Pt. Was stepping up onto a curb and tripped fell into a wall.  Pt. Had a forehead hematoma with an abrasion. Abrasion to his lip and rt. Shoulder dislocation. Pt. Is alert and oriented X4  Pt. Received 200 mcg Fentanyl en route.

## 2015-10-02 NOTE — ED Notes (Signed)
Pt. Very diaphoretic, Dr. Dayna Barker is aware .  Pt. Denies any chest pain.. ECG being done.; CBG was done

## 2015-10-02 NOTE — ED Notes (Signed)
Pt ambulated to the restroom independently with a steady gait.

## 2015-10-03 ENCOUNTER — Telehealth: Payer: Self-pay | Admitting: Cardiology

## 2015-10-03 DIAGNOSIS — S4991XA Unspecified injury of right shoulder and upper arm, initial encounter: Secondary | ICD-10-CM | POA: Diagnosis not present

## 2015-10-03 NOTE — Telephone Encounter (Signed)
DISCUSSED WITH  DR  [name] ,  APPEARS PT  IS  NOT ON PLAVIX  BASED ON  OUR  LIST  BUT IF HE   IS TAKING  MAY STOP PER DR SKAINS. DR  Veverly Fells  NOTIFIED .Adonis Housekeeper

## 2015-10-03 NOTE — Telephone Encounter (Signed)
New Message  Dr Veverly Fells called to discuss Plavix for pt's emergency surgery scheduled for 10/07/15- he stated that pt could still keep appt w/ Dr [name] on 3/20 @ 1030am and have surgery later that day- but needed a response today about plavix. Please call back and discuss.   Request for surgical clearance:  1. What type of surgery is being performed? Shoulder repair- emergency   2. When is this surgery scheduled? 10/07/15  3. Are there any medications that need to be held prior to surgery and how long? Plavix before surgery (Dr Veverly Fells suggested possible lovenox bridge?)    4. Name of physician performing surgery? Dr Veverly Fells   5. What is your office phone and fax number? [phone] 6.

## 2015-10-04 ENCOUNTER — Encounter (HOSPITAL_COMMUNITY): Payer: Self-pay | Admitting: *Deleted

## 2015-10-04 NOTE — H&P (Signed)
Shane Watson is an 70 y.o. male.    Chief Complaint: right shoulder pain  HPI: Pt is a 70 y.o. male complaining of right shoulder pain s/p fall and proximal humerus fracture. Pain had continually increased since the beginning. 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 Various options are discussed with the patient. Risks, benefits and expectations were discussed with the patient. Patient understand the risks, benefits and expectations and wishes to proceed with surgery.   PCP:  Shirline Frees, MD  D/C Plans: Home  PMH: Past Medical History  Diagnosis Date  . Allergy   . Anemia   . Neuropathy ([place])   . COPD (chronic obstructive pulmonary disease) ([place])   . Hyperthyroidism   . Hyperlipidemia   . Vertigo   . Chronic kidney disease   . Coronary artery disease   . Heart murmur   . Diabetes mellitus without complication (H[facility])     PSH: Past Surgical History  Procedure Laterality Date  . Cholecystectomy    . 2 heart stents    . Right knee surgery      "scraped it out"    Social History:  reports that he quit smoking about 28 years ago. His smoking use included Cigarettes. He has a 22.5 pack-year smoking history. He has never used smokeless tobacco. He reports that he does not drink alcohol or use illicit drugs.  Allergies:  Allergies  Allergen Reactions  . Aspirin Swelling    Angioedema-must take EC coated form only  . Ibuprofen Swelling    Angioedema   . Penicillins Hives  . Sulfa Antibiotics Hives    Medications: No current facility-administered medications for this encounter.   Current Outpatient Prescriptions  Medication Sig Dispense Refill  . acetaminophen (TYLENOL) 500 MG tablet Take 500 mg by mouth at bedtime.    Marland Kitchen albuterol (PROVENTIL HFA;VENTOLIN HFA) 108 (90 BASE) MCG/ACT inhaler Inhale 2 puffs into the lungs every 4 (four) hours as needed for wheezing or shortness of breath (((PLAN A))).     Marland Kitchen albuterol (PROVENTIL) (5  MG/ML) 0.5% nebulizer solution Take 2.5 mg by nebulization every 4 (four) hours as needed for wheezing or shortness of breath (((PLAN B))).     Marland Kitchen aspirin EC 81 MG tablet Take 81 mg by mouth daily.    Marland Kitchen atorvastatin (LIPITOR) 40 MG tablet Take 1 tablet by mouth at  bedtime 90 tablet 2  . budesonide-formoterol (SYMBICORT) 160-4.5 MCG/ACT inhaler Take 2 puffs first thing in am and then another 2 puffs about 12 hours later. 3 Inhaler 3  . cetirizine (ZYRTEC) 10 MG tablet Take 10 mg by mouth every morning.     . colesevelam (WELCHOL) 625 MG tablet Take 1 tablet (625 mg total) by mouth daily with breakfast. 90 tablet 1  . Dextromethorphan-Guaifenesin (MUCINEX DM MAXIMUM STRENGTH) 60-1200 MG TB12 Take 1,200 mg by mouth 2 (two) times daily as needed (for congestion).     Marland Kitchen diltiazem (CARDIZEM) 60 MG tablet Take 1 tablet (60 mg total) by mouth 4 (four) times daily as needed. (Patient taking differently: Take 60 mg by mouth 4 (four) times daily as needed (for tachycardia). ) 30 tablet prn  . doxazosin (CARDURA) 8 MG tablet Take 1 tablet by mouth  daily (Patient taking differently: Take 1 tablet by mouth  daily at bedtime) 90 tablet 1  . famotidine (PEPCID) 20 MG tablet Take 1 tablet by mouth at  bedtime 90 tablet 1  . furosemide (LASIX) 20 MG  tablet Take 2 tablets by mouth  daily 180 tablet 1  . gabapentin (NEURONTIN) 100 MG capsule Take 300 mg by mouth 2 (two) times daily. 3 capsules by mouth twice daily    . linagliptin (TRADJENTA) 5 MG TABS tablet Take 5 mg by mouth daily.    Marland Kitchen losartan (COZAAR) 50 MG tablet Take 1 tablet by mouth  daily 90 tablet 3  . meclizine (ANTIVERT) 12.5 MG tablet Take 25 mg by mouth every morning. Reported on 08/27/2015    . Multiple Vitamin (MULTIVITAMIN WITH MINERALS) TABS tablet Take 1 tablet by mouth at bedtime.     . nitroGLYCERIN (NITROSTAT) 0.4 MG SL tablet Place 1 tablet (0.4 mg total) under the tongue every 5 (five) minutes as needed for chest pain. 25 tablet prn  .  oxybutynin (DITROPAN) 5 MG tablet Take 5 mg by mouth daily as needed for bladder spasms.    Marland Kitchen oxyCODONE-acetaminophen (PERCOCET/ROXICET) 5-325 MG tablet Take 2 tablets by mouth every 8 (eight) hours as needed for severe pain. 60 tablet 0  . OXYGEN Inhale 2 L into the lungs at bedtime.    . pantoprazole (PROTONIX) 40 MG tablet Take 1 tablet by mouth  daily 90 tablet 3  . potassium chloride SA (K-DUR,KLOR-CON) 20 MEQ tablet Take 1 tablet by mouth  daily 90 tablet 3  . Respiratory Therapy Supplies (FLUTTER) DEVI Use as directed 1 each 0  . SPIRIVA RESPIMAT 2.5 MCG/ACT AERS INHALE 2 PUFFS INTO THE LUNGS EVERY MORNING 4 g 1    Results for orders placed or performed during the hospital encounter of 10/02/15 (from the past 48 hour(s))  CBC with Differential     Status: Abnormal   Collection Time: 10/02/15  3:52 PM  Result Value Ref Range   WBC 6.1 4.0 - 10.5 K/uL   RBC 5.04 4.22 - 5.81 MIL/uL   Hemoglobin 13.9 13.0 - 17.0 g/dL   HCT 43.4 39.0 - 52.0 %   MCV 86.1 78.0 - 100.0 fL   MCH 27.6 26.0 - 34.0 pg   MCHC 32.0 30.0 - 36.0 g/dL   RDW 14.1 11.5 - 15.5 %   Platelets 97 (L) 150 - 400 K/uL    Comment: PLATELET COUNT CONFIRMED BY SMEAR   Neutrophils Relative % 62 %   Neutro Abs 3.8 1.7 - 7.7 K/uL   Lymphocytes Relative 25 %   Lymphs Abs 1.5 0.7 - 4.0 K/uL   Monocytes Relative 10 %   Monocytes Absolute 0.6 0.1 - 1.0 K/uL   Eosinophils Relative 2 %   Eosinophils Absolute 0.1 0.0 - 0.7 K/uL   Basophils Relative 1 %   Basophils Absolute 0.0 0.0 - 0.1 K/uL  Comprehensive metabolic panel     Status: Abnormal   Collection Time: 10/02/15  3:52 PM  Result Value Ref Range   Sodium 143 135 - 145 mmol/L   Potassium 4.3 3.5 - 5.1 mmol/L   Chloride 104 101 - 111 mmol/L   CO2 28 22 - 32 mmol/L   Glucose, Bld 175 (H) 65 - 99 mg/dL   BUN 19 6 - 20 mg/dL   Creatinine, Ser 1.34 (H) 0.61 - 1.24 mg/dL   Calcium 9.2 8.9 - 10.3 mg/dL   Total Protein 6.6 6.5 - 8.1 g/dL   Albumin 3.9 3.5 - 5.0 g/dL    AST 30 15 - 41 U/L   ALT 30 17 - 63 U/L   Alkaline Phosphatase 84 38 - 126 U/L   Total Bilirubin 1.0  0.3 - 1.2 mg/dL   GFR calc non Af Amer 52 (L) >60 mL/min   GFR calc Af Amer >60 >60 mL/min    Comment: (NOTE) The eGFR has been calculated using the CKD EPI equation. This calculation has not been validated in all clinical situations. eGFR's persistently <60 mL/min signify possible Chronic Kidney Disease.    Anion gap 11 5 - 15  Protime-INR     Status: None   Collection Time: 10/02/15  3:52 PM  Result Value Ref Range   Prothrombin Time 15.2 11.6 - 15.2 seconds   INR 1.18 0.00 - 1.49  CBG monitoring, ED     Status: Abnormal   Collection Time: 10/02/15  3:57 PM  Result Value Ref Range   Glucose-Capillary 160 (H) 65 - 99 mg/dL   Dg Pelvis 1-2 Views  10/02/2015  CLINICAL DATA:  Status post fall.  Right hip pain. EXAM: PELVIS - 1-2 VIEW COMPARISON:  None. FINDINGS: Cortical irregularity of the distal right femoral neck concerning for nondisplaced fracture. No pelvic bone lesions are seen. IMPRESSION: Cortical irregularity of the distal right femoral neck concerning for nondisplaced fracture. Recommend dedicated 2 view xray of the right hip. Electronically Signed   By: Kathreen Devoid   On: 10/02/2015 17:18   Dg Shoulder Right  10/02/2015  CLINICAL DATA:  Fall today with severe right shoulder pain. EXAM: RIGHT SHOULDER - 2+ VIEW COMPARISON:  None. FINDINGS: AP and scapular Y-views of the right shoulder are provided. There is a markedly displaced/comminuted fracture centered within the right humeral neck, with probable involvement of the humeral head. There is at least mild impaction at the fracture site and there are large avulsion fracture fragments located superior to the humeral head. It is not possible to exclude additional fracture within the adjacent glenoid. No fracture seen within the body of the scapula. Overlying acromioclavicular joint space remains normally aligned. IMPRESSION:  Displaced/comminuted fractures centered within the right humeral neck with probable involvement of the humeral head as well. At least some degree of impaction at the main fracture site and large avulsion fracture fragments located superior to the humeral head. Cannot exclude additional fracture within the adjacent glenoid. Main component of the humeral head remains grossly well positioned relative to the glenoid fossa. Electronically Signed   By: Franki Cabot M.D.   On: 10/02/2015 17:20   Ct Head Wo Contrast  10/02/2015  CLINICAL DATA:  Patient fell and has multiple head and facial lacerations with neck pain radiating into right shoulder. EXAM: CT HEAD WITHOUT CONTRAST CT MAXILLOFACIAL WITHOUT CONTRAST CT CERVICAL SPINE WITHOUT CONTRAST TECHNIQUE: Multidetector CT imaging of the head, cervical spine, and maxillofacial structures were performed using the standard protocol without intravenous contrast. Multiplanar CT image reconstructions of the cervical spine and maxillofacial structures were also generated. COMPARISON:  Head and cervical spine CT from 08/01/2009. Chest CT from 11/21/2014 and 10/20/2013. FINDINGS: CT HEAD FINDINGS There is no evidence for acute hemorrhage, hydrocephalus, mass lesion, or abnormal extra-axial fluid collection. No definite CT evidence for acute infarction. Diffuse loss of parenchymal volume is consistent with atrophy. Patchy low attenuation in the deep hemispheric and periventricular white matter is nonspecific, but likely reflects chronic microvascular ischemic demyelination. The visualized paranasal sinuses and mastoid air cells are clear. No evidence for skull fracture CT MAXILLOFACIAL FINDINGS Mandible is intact. The temporomandibular joints are located. No evidence for zygomatic arch fracture. No maxillary sinus fracture. No medial or inferior orbital wall blowout fracture. Pterygoid plates are intact. No evidence  for orbital rim fracture. The globes are symmetric in size and  shape. Intra orbital fat is preserved bilaterally. CT CERVICAL SPINE FINDINGS Imaging was obtained from the skullbase through the T2 vertebral body. No evidence of fracture. No subluxation. There is diffuse loss of intervertebral disc height throughout the cervical spine, from the C2-3 level down to C7-T1 with endplate spurring seen at nearly all levels. The facets are well aligned bilaterally. Mild straightening of the normal cervical lordosis is evident. There is no prevertebral soft tissue swelling. 5 mm posterior left upper lobe pulmonary nodule along the major fissure is unchanged since prior chest CT. IMPRESSION: 1. No acute intracranial abnormality. 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 2. No skull fracture. 3. No evidence for fracture involving the bony anatomy of the face. 4. 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 5. 5 mm posterior left upper lobe pulmonary nodule, stable since CT scan of 10/20/2013, consistent with benign process. Electronically Signed   By: Misty Stanley M.D.   On: 10/02/2015 17:14   Ct Cervical Spine Wo Contrast  10/02/2015  CLINICAL DATA:  Patient fell and has multiple head and facial lacerations with neck pain radiating into right shoulder. EXAM: CT HEAD WITHOUT CONTRAST CT MAXILLOFACIAL WITHOUT CONTRAST CT CERVICAL SPINE WITHOUT CONTRAST TECHNIQUE: Multidetector CT imaging of the head, cervical spine, and maxillofacial structures were performed using the standard protocol without intravenous contrast. Multiplanar CT image reconstructions of the cervical spine and maxillofacial structures were also generated. COMPARISON:  Head and cervical spine CT from 08/01/2009. Chest CT from 11/21/2014 and 10/20/2013. FINDINGS: CT HEAD FINDINGS There is no evidence for acute hemorrhage, hydrocephalus, mass lesion, or abnormal extra-axial fluid collection. No definite CT evidence for acute infarction. Diffuse loss  of parenchymal volume is consistent with atrophy. Patchy low attenuation in the deep hemispheric and periventricular white matter is nonspecific, but likely reflects chronic microvascular ischemic demyelination. The visualized paranasal sinuses and mastoid air cells are clear. No evidence for skull fracture CT MAXILLOFACIAL FINDINGS Mandible is intact. The temporomandibular joints are located. No evidence for zygomatic arch fracture. No maxillary sinus fracture. No medial or inferior orbital wall blowout fracture. Pterygoid plates are intact. No evidence for orbital rim fracture. The globes are symmetric in size and shape. Intra orbital fat is preserved bilaterally. CT CERVICAL SPINE FINDINGS Imaging was obtained from the skullbase through the T2 vertebral body. No evidence of fracture. No subluxation. There is diffuse loss of intervertebral disc height throughout the cervical spine, from the C2-3 level down to C7-T1 with endplate spurring seen at nearly all levels. The facets are well aligned bilaterally. Mild straightening of the normal cervical lordosis is evident. There is no prevertebral soft tissue swelling. 5 mm posterior left upper lobe pulmonary nodule along the major fissure is unchanged since prior chest CT. IMPRESSION: 1. No acute intracranial abnormality. 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 2. No skull fracture. 3. No evidence for fracture involving the bony anatomy of the face. 4. 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 5. 5 mm posterior left upper lobe pulmonary nodule, stable since CT scan of 10/20/2013, consistent with benign process. Electronically Signed   By: Misty Stanley M.D.   On: 10/02/2015 17:14   Ct Shoulder Right Wo Contrast  10/02/2015  CLINICAL DATA:  Status post fall onto top of right shoulder. Right humeral neck fracture. Further evaluation requested. Initial encounter. EXAM: CT OF THE  RIGHT SHOULDER  WITHOUT CONTRAST TECHNIQUE: Multidetector CT imaging was performed according to the standard protocol. Multiplanar CT image reconstructions were also generated. COMPARISON:  Right shoulder radiographs performed earlier today at 4:48 p.m. FINDINGS: There is a significantly comminuted fracture of the right humeral head and neck, concerning for a Neer four-part fracture. There is inferior subluxation of the largest superior humeral head fragment, with the medial edge abutting the glenoid fossa, and multiple large displaced fragments anteriorly, superiorly and inferiorly. The articular surface with the glenoid fossa is not definitely seen, and its fragment may be displaced either superiorly or inferiorly. There is mild impaction at the humeral neck. A hematoma is noted at the right glenohumeral joint space. Degenerative change is noted at the right acromioclavicular joint, with inferior osteophyte formation. No additional fractures are identified. Mild soft tissue injury is noted at the right axilla. The visualized portions of the right lung are grossly clear. The visualized portions of the mediastinum are grossly unremarkable. The vasculature is not well assessed without contrast. There is focal kinking of the right axillary vein raises question for some degree of disruption, though this may reflect motion artifact. Would correlate for any associated symptoms. IMPRESSION: 1. Significantly comminuted fracture of the right humeral head and neck, concerning for a four-part fracture. Inferior subluxation of the largest superior humeral head fragment, with the medial edge abutting the glenoid fossa, and multiple large displaced fragments anteriorly, superiorly and inferiorly. The articular surface of the the glenoid fossa is not definitely seen. This fragment may be displaced either superiorly or inferiorly. 2. Mild impaction noted at the humeral neck. 3. Hematoma at the right glenohumeral joint space. 4. Mild soft tissue  injury at the right axilla. Focal kinking of the right axillary vein raises question for some degree of disruption, though this may simply reflect motion artifact. Would correlate for any associated symptoms. Electronically Signed   By: Garald Balding M.D.   On: 10/02/2015 20:36   Dg Hip Unilat With Pelvis 2-3 Views Right  10/02/2015  CLINICAL DATA:  70 year old male with history of trauma from a fall today while stepping up on top of of a curb. Right-sided hand pain. EXAM: DG HIP (WITH OR WITHOUT PELVIS) 2-3V RIGHT COMPARISON:  AP pelvis from earlier today. FINDINGS: There is no evidence of hip fracture or dislocation. There is no evidence of arthropathy or other focal bone abnormality. IMPRESSION: 1. No evidence of acute femoral neck fracture. The finding on the examination from earlier today appeared to be related to mach effect (artifact). Electronically Signed   By: Vinnie Langton M.D.   On: 10/02/2015 18:15   Ct Maxillofacial Wo Cm  10/02/2015  CLINICAL DATA:  Patient fell and has multiple head and facial lacerations with neck pain radiating into right shoulder. EXAM: CT HEAD WITHOUT CONTRAST CT MAXILLOFACIAL WITHOUT CONTRAST CT CERVICAL SPINE WITHOUT CONTRAST TECHNIQUE: Multidetector CT imaging of the head, cervical spine, and maxillofacial structures were performed using the standard protocol without intravenous contrast. Multiplanar CT image reconstructions of the cervical spine and maxillofacial structures were also generated. COMPARISON:  Head and cervical spine CT from 08/01/2009. Chest CT from 11/21/2014 and 10/20/2013. FINDINGS: CT HEAD FINDINGS There is no evidence for acute hemorrhage, hydrocephalus, mass lesion, or abnormal extra-axial fluid collection. No definite CT evidence for acute infarction. Diffuse loss of parenchymal volume is consistent with atrophy. Patchy low attenuation in the deep hemispheric and periventricular white matter is nonspecific, but likely reflects chronic  microvascular ischemic demyelination. The visualized paranasal  sinuses and mastoid air cells are clear. No evidence for skull fracture CT MAXILLOFACIAL FINDINGS Mandible is intact. The temporomandibular joints are located. No evidence for zygomatic arch fracture. No maxillary sinus fracture. No medial or inferior orbital wall blowout fracture. Pterygoid plates are intact. No evidence for orbital rim fracture. The globes are symmetric in size and shape. Intra orbital fat is preserved bilaterally. CT CERVICAL SPINE FINDINGS Imaging was obtained from the skullbase through the T2 vertebral body. No evidence of fracture. No subluxation. There is diffuse loss of intervertebral disc height throughout the cervical spine, from the C2-3 level down to C7-T1 with endplate spurring seen at nearly all levels. The facets are well aligned bilaterally. Mild straightening of the normal cervical lordosis is evident. There is no prevertebral soft tissue swelling. 5 mm posterior left upper lobe pulmonary nodule along the major fissure is unchanged since prior chest CT. IMPRESSION: 1. No acute intracranial abnormality. 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 2. No skull fracture. 3. No evidence for fracture involving the bony anatomy of the face. 4. 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 5. 5 mm posterior left upper lobe pulmonary nodule, stable since CT scan of 10/20/2013, consistent with benign process. Electronically Signed   By: Misty Stanley M.D.   On: 10/02/2015 17:14    ROS: Pain with rom of the right upper extremity  Physical Exam:  Alert and oriented 70 y.o. male in no acute distress Cranial nerves 2-12 intact Cervical spine: full rom with no tenderness, nv intact distally Chest: active breath sounds bilaterally, no wheeze rhonchi or rales Heart: regular rate and rhythm, no murmur Abd: non tender non distended with active bowel  sounds Hip is stable with rom  Right shoulder moderate edema and ecchymosis  nv intact distally No rashes or edema  Assessment/Plan Assessment: right proximal humerus fracture  Plan: Patient will undergo a right reverse total shoulder by Dr. Veverly Fells at [facility]. Risks benefits and expectations were discussed with the patient. Patient understand risks, benefits and expectations and wishes to proceed.

## 2015-10-04 NOTE — Progress Notes (Signed)
Shane Watson Reports that his heart rate increases at times, usually happens when he is walking fast. Patient experiences lightheadedness, woozey feelling, and sweats when this happens. Patient's cardiologist is Dr [name].  Dr [name] is aware of patient's episodes of increased heart rate and patient has an order for Cardizem when this happens. Patient sees Dr [name] on Monday am for regular scheduled visit.

## 2015-10-06 MED ORDER — CLINDAMYCIN PHOSPHATE 900 MG/50ML IV SOLN
900.0000 mg | INTRAVENOUS | Status: DC
  Filled 2015-10-06: qty 50

## 2015-10-07 ENCOUNTER — Ambulatory Visit (HOSPITAL_COMMUNITY): Payer: Medicare Other | Admitting: Anesthesiology

## 2015-10-07 ENCOUNTER — Ambulatory Visit: Payer: Medicare Other | Admitting: Cardiology

## 2015-10-07 ENCOUNTER — Inpatient Hospital Stay (HOSPITAL_COMMUNITY)
Admission: RE | Admit: 2015-10-07 | Discharge: 2015-10-10 | DRG: 483 | Disposition: A | Discharge status: Skilled Nursing Facility | Payer: Medicare Other | Source: Ambulatory Visit | Attending: Orthopedic Surgery | Admitting: Orthopedic Surgery

## 2015-10-07 ENCOUNTER — Encounter (HOSPITAL_COMMUNITY): Payer: Self-pay | Admitting: *Deleted

## 2015-10-07 DIAGNOSIS — R262 Difficulty in walking, not elsewhere classified: Secondary | ICD-10-CM | POA: Diagnosis not present

## 2015-10-07 DIAGNOSIS — S42301A Unspecified fracture of shaft of humerus, right arm, initial encounter for closed fracture: Secondary | ICD-10-CM | POA: Diagnosis not present

## 2015-10-07 DIAGNOSIS — Z6841 Body Mass Index (BMI) 40.0 and over, adult: Secondary | ICD-10-CM

## 2015-10-07 DIAGNOSIS — I10 Essential (primary) hypertension: Secondary | ICD-10-CM | POA: Diagnosis not present

## 2015-10-07 DIAGNOSIS — J449 Chronic obstructive pulmonary disease, unspecified: Secondary | ICD-10-CM | POA: Diagnosis present

## 2015-10-07 DIAGNOSIS — I25118 Atherosclerotic heart disease of native coronary artery with other forms of angina pectoris: Secondary | ICD-10-CM | POA: Diagnosis not present

## 2015-10-07 DIAGNOSIS — Z955 Presence of coronary angioplasty implant and graft: Secondary | ICD-10-CM | POA: Diagnosis not present

## 2015-10-07 DIAGNOSIS — E785 Hyperlipidemia, unspecified: Secondary | ICD-10-CM | POA: Diagnosis present

## 2015-10-07 DIAGNOSIS — E1140 Type 2 diabetes mellitus with diabetic neuropathy, unspecified: Secondary | ICD-10-CM | POA: Diagnosis present

## 2015-10-07 DIAGNOSIS — R600 Localized edema: Secondary | ICD-10-CM | POA: Diagnosis not present

## 2015-10-07 DIAGNOSIS — L03115 Cellulitis of right lower limb: Secondary | ICD-10-CM | POA: Diagnosis present

## 2015-10-07 DIAGNOSIS — R278 Other lack of coordination: Secondary | ICD-10-CM | POA: Diagnosis not present

## 2015-10-07 DIAGNOSIS — Z87891 Personal history of nicotine dependence: Secondary | ICD-10-CM

## 2015-10-07 DIAGNOSIS — G4733 Obstructive sleep apnea (adult) (pediatric): Secondary | ICD-10-CM | POA: Diagnosis present

## 2015-10-07 DIAGNOSIS — J9612 Chronic respiratory failure with hypercapnia: Secondary | ICD-10-CM | POA: Diagnosis not present

## 2015-10-07 DIAGNOSIS — J438 Other emphysema: Secondary | ICD-10-CM

## 2015-10-07 DIAGNOSIS — Z96611 Presence of right artificial shoulder joint: Secondary | ICD-10-CM

## 2015-10-07 DIAGNOSIS — N183 Chronic kidney disease, stage 3 unspecified: Secondary | ICD-10-CM | POA: Diagnosis present

## 2015-10-07 DIAGNOSIS — M6281 Muscle weakness (generalized): Secondary | ICD-10-CM | POA: Diagnosis not present

## 2015-10-07 DIAGNOSIS — Z7984 Long term (current) use of oral hypoglycemic drugs: Secondary | ICD-10-CM

## 2015-10-07 DIAGNOSIS — Z7982 Long term (current) use of aspirin: Secondary | ICD-10-CM

## 2015-10-07 DIAGNOSIS — S42241A 4-part fracture of surgical neck of right humerus, initial encounter for closed fracture: Secondary | ICD-10-CM | POA: Diagnosis not present

## 2015-10-07 DIAGNOSIS — I1310 Hypertensive heart and chronic kidney disease without heart failure, with stage 1 through stage 4 chronic kidney disease, or unspecified chronic kidney disease: Secondary | ICD-10-CM | POA: Diagnosis present

## 2015-10-07 DIAGNOSIS — W010XXA Fall on same level from slipping, tripping and stumbling without subsequent striking against object, initial encounter: Secondary | ICD-10-CM | POA: Diagnosis present

## 2015-10-07 DIAGNOSIS — E119 Type 2 diabetes mellitus without complications: Secondary | ICD-10-CM | POA: Diagnosis not present

## 2015-10-07 DIAGNOSIS — R41841 Cognitive communication deficit: Secondary | ICD-10-CM | POA: Diagnosis not present

## 2015-10-07 DIAGNOSIS — K219 Gastro-esophageal reflux disease without esophagitis: Secondary | ICD-10-CM | POA: Diagnosis present

## 2015-10-07 DIAGNOSIS — Z7951 Long term (current) use of inhaled steroids: Secondary | ICD-10-CM

## 2015-10-07 DIAGNOSIS — G8918 Other acute postprocedural pain: Secondary | ICD-10-CM | POA: Diagnosis not present

## 2015-10-07 DIAGNOSIS — I119 Hypertensive heart disease without heart failure: Secondary | ICD-10-CM | POA: Diagnosis present

## 2015-10-07 DIAGNOSIS — L03116 Cellulitis of left lower limb: Secondary | ICD-10-CM | POA: Diagnosis present

## 2015-10-07 DIAGNOSIS — Z01818 Encounter for other preprocedural examination: Secondary | ICD-10-CM

## 2015-10-07 DIAGNOSIS — Z79899 Other long term (current) drug therapy: Secondary | ICD-10-CM

## 2015-10-07 DIAGNOSIS — Z471 Aftercare following joint replacement surgery: Secondary | ICD-10-CM | POA: Diagnosis not present

## 2015-10-07 DIAGNOSIS — E1165 Type 2 diabetes mellitus with hyperglycemia: Secondary | ICD-10-CM | POA: Diagnosis not present

## 2015-10-07 DIAGNOSIS — E1122 Type 2 diabetes mellitus with diabetic chronic kidney disease: Secondary | ICD-10-CM | POA: Diagnosis present

## 2015-10-07 DIAGNOSIS — N189 Chronic kidney disease, unspecified: Secondary | ICD-10-CM | POA: Diagnosis not present

## 2015-10-07 DIAGNOSIS — I2510 Atherosclerotic heart disease of native coronary artery without angina pectoris: Secondary | ICD-10-CM | POA: Diagnosis present

## 2015-10-07 DIAGNOSIS — Z96619 Presence of unspecified artificial shoulder joint: Secondary | ICD-10-CM

## 2015-10-07 DIAGNOSIS — R0600 Dyspnea, unspecified: Secondary | ICD-10-CM | POA: Diagnosis not present

## 2015-10-07 DIAGNOSIS — S42201A Unspecified fracture of upper end of right humerus, initial encounter for closed fracture: Secondary | ICD-10-CM | POA: Diagnosis not present

## 2015-10-07 DIAGNOSIS — M25511 Pain in right shoulder: Secondary | ICD-10-CM | POA: Diagnosis not present

## 2015-10-07 DIAGNOSIS — S42309A Unspecified fracture of shaft of humerus, unspecified arm, initial encounter for closed fracture: Secondary | ICD-10-CM | POA: Diagnosis not present

## 2015-10-07 DIAGNOSIS — S42209A Unspecified fracture of upper end of unspecified humerus, initial encounter for closed fracture: Secondary | ICD-10-CM | POA: Diagnosis present

## 2015-10-07 DIAGNOSIS — S42291A Other displaced fracture of upper end of right humerus, initial encounter for closed fracture: Secondary | ICD-10-CM | POA: Diagnosis not present

## 2015-10-07 DIAGNOSIS — S42209D Unspecified fracture of upper end of unspecified humerus, subsequent encounter for fracture with routine healing: Secondary | ICD-10-CM | POA: Diagnosis not present

## 2015-10-07 HISTORY — DX: Unspecified osteoarthritis, unspecified site: M19.90

## 2015-10-07 HISTORY — DX: Personal history of other medical treatment: Z92.89

## 2015-10-07 HISTORY — DX: Gastro-esophageal reflux disease without esophagitis: K21.9

## 2015-10-07 HISTORY — DX: Personal history of urinary calculi: Z87.442

## 2015-10-07 HISTORY — DX: Benign prostatic hyperplasia without lower urinary tract symptoms: N40.0

## 2015-10-07 HISTORY — DX: Other specified postprocedural states: R11.2

## 2015-10-07 HISTORY — DX: Obstructive sleep apnea (adult) (pediatric): G47.33

## 2015-10-07 HISTORY — DX: Other complications of anesthesia, initial encounter: T88.59XA

## 2015-10-07 HISTORY — DX: Nausea with vomiting, unspecified: Z98.890

## 2015-10-07 HISTORY — DX: Adverse effect of unspecified anesthetic, initial encounter: T41.45XA

## 2015-10-07 HISTORY — DX: Chronic kidney disease, stage 3 (moderate): N18.3

## 2015-10-07 HISTORY — DX: Chronic kidney disease, stage 3 unspecified: N18.30

## 2015-10-07 HISTORY — DX: Hypertensive heart disease without heart failure: I11.9

## 2015-10-07 LAB — GLUCOSE, CAPILLARY
Glucose-Capillary: 121 mg/dL — ABNORMAL HIGH (ref 65–99)
Glucose-Capillary: 113 mg/dL — ABNORMAL HIGH (ref 65–99)

## 2015-10-07 MED ORDER — DOXAZOSIN MESYLATE 8 MG PO TABS
8.0000 mg | ORAL_TABLET | Freq: Every day | ORAL | Status: DC
  Administered 2015-10-08 – 2015-10-09 (×2): 8 mg via ORAL
  Filled 2015-10-07 (×4): qty 1

## 2015-10-07 MED ORDER — FENTANYL CITRATE (PF) 100 MCG/2ML IJ SOLN
INTRAMUSCULAR | Status: AC
  Administered 2015-10-07: 50 ug via INTRAVENOUS
  Filled 2015-10-07: qty 2

## 2015-10-07 MED ORDER — PANTOPRAZOLE SODIUM 40 MG PO TBEC
40.0000 mg | DELAYED_RELEASE_TABLET | Freq: Every day | ORAL | Status: DC
  Administered 2015-10-09 – 2015-10-10 (×2): 40 mg via ORAL
  Filled 2015-10-07 (×2): qty 1

## 2015-10-07 MED ORDER — ATORVASTATIN CALCIUM 40 MG PO TABS
40.0000 mg | ORAL_TABLET | Freq: Every day | ORAL | Status: DC
  Administered 2015-10-08 – 2015-10-09 (×3): 40 mg via ORAL
  Filled 2015-10-07 (×3): qty 1

## 2015-10-07 MED ORDER — GABAPENTIN 300 MG PO CAPS
300.0000 mg | ORAL_CAPSULE | Freq: Two times a day (BID) | ORAL | Status: DC
  Administered 2015-10-08: 300 mg via ORAL
  Filled 2015-10-07: qty 1

## 2015-10-07 MED ORDER — LOSARTAN POTASSIUM 50 MG PO TABS: 50.0000 mg | ORAL_TABLET | Freq: Every day | ORAL | Status: DC

## 2015-10-07 MED ORDER — ALBUTEROL SULFATE (2.5 MG/3ML) 0.083% IN NEBU: 3.0000 mL | INHALATION_SOLUTION | RESPIRATORY_TRACT | Status: DC | PRN

## 2015-10-07 MED ORDER — HYDROCODONE-ACETAMINOPHEN 5-325 MG PO TABS
1.0000 | ORAL_TABLET | ORAL | Status: DC | PRN
  Administered 2015-10-09 (×3): 2 via ORAL
  Filled 2015-10-07 (×3): qty 2

## 2015-10-07 MED ORDER — DM-GUAIFENESIN ER 30-600 MG PO TB12
2.0000 | ORAL_TABLET | Freq: Two times a day (BID) | ORAL | Status: DC | PRN
  Filled 2015-10-07: qty 2

## 2015-10-07 MED ORDER — NITROGLYCERIN 0.4 MG SL SUBL: 0.4000 mg | SUBLINGUAL_TABLET | SUBLINGUAL | Status: DC | PRN

## 2015-10-07 MED ORDER — ADULT MULTIVITAMIN W/MINERALS CH
1.0000 | ORAL_TABLET | Freq: Every day | ORAL | Status: DC
  Administered 2015-10-08 – 2015-10-09 (×3): 1 via ORAL
  Filled 2015-10-07 (×3): qty 1

## 2015-10-07 MED ORDER — DILTIAZEM HCL 60 MG PO TABS
60.0000 mg | ORAL_TABLET | Freq: Four times a day (QID) | ORAL | Status: DC | PRN
  Filled 2015-10-07: qty 1

## 2015-10-07 MED ORDER — MIDAZOLAM HCL 2 MG/2ML IJ SOLN
1.0000 mg | Freq: Once | INTRAMUSCULAR | Status: AC
  Administered 2015-10-07: 1 mg via INTRAVENOUS

## 2015-10-07 MED ORDER — COLESEVELAM HCL 625 MG PO TABS
625.0000 mg | ORAL_TABLET | Freq: Every day | ORAL | Status: DC
  Administered 2015-10-09 – 2015-10-10 (×2): 625 mg via ORAL
  Filled 2015-10-07 (×3): qty 1

## 2015-10-07 MED ORDER — ONDANSETRON HCL 4 MG/2ML IJ SOLN: 4.0000 mg | Freq: Four times a day (QID) | INTRAMUSCULAR | Status: DC | PRN

## 2015-10-07 MED ORDER — LIDOCAINE HCL (CARDIAC) 20 MG/ML IV SOLN
INTRAVENOUS | Status: AC
  Filled 2015-10-07: qty 5

## 2015-10-07 MED ORDER — LINAGLIPTIN 5 MG PO TABS
5.0000 mg | ORAL_TABLET | Freq: Every day | ORAL | Status: DC
  Administered 2015-10-09 – 2015-10-10 (×2): 5 mg via ORAL
  Filled 2015-10-07 (×2): qty 1

## 2015-10-07 MED ORDER — ASPIRIN EC 81 MG PO TBEC
81.0000 mg | DELAYED_RELEASE_TABLET | Freq: Every day | ORAL | Status: DC
  Administered 2015-10-09 – 2015-10-10 (×2): 81 mg via ORAL
  Filled 2015-10-07 (×2): qty 1

## 2015-10-07 MED ORDER — BUPIVACAINE-EPINEPHRINE (PF) 0.5% -1:200000 IJ SOLN
INTRAMUSCULAR | Status: AC | PRN
  Administered 2015-10-07: 25 mL via PERINEURAL

## 2015-10-07 MED ORDER — FLUTICASONE FUROATE-VILANTEROL 200-25 MCG/INH IN AEPB
1.0000 | INHALATION_SPRAY | Freq: Every day | RESPIRATORY_TRACT | Status: DC
  Administered 2015-10-08: 1 via RESPIRATORY_TRACT
  Filled 2015-10-07: qty 28

## 2015-10-07 MED ORDER — CLINDAMYCIN PHOSPHATE 600 MG/50ML IV SOLN
600.0000 mg | Freq: Three times a day (TID) | INTRAVENOUS | Status: AC
  Administered 2015-10-07 – 2015-10-08 (×3): 600 mg via INTRAVENOUS
  Filled 2015-10-07 (×5): qty 50

## 2015-10-07 MED ORDER — SENNOSIDES-DOCUSATE SODIUM 8.6-50 MG PO TABS: 1.0000 | ORAL_TABLET | Freq: Every evening | ORAL | Status: DC | PRN

## 2015-10-07 MED ORDER — MECLIZINE HCL 25 MG PO TABS
25.0000 mg | ORAL_TABLET | Freq: Every morning | ORAL | Status: DC
  Administered 2015-10-09 – 2015-10-10 (×2): 25 mg via ORAL
  Filled 2015-10-07 (×5): qty 1

## 2015-10-07 MED ORDER — LACTATED RINGERS IV SOLN
INTRAVENOUS | Status: DC
  Administered 2015-10-07: 15:00:00 via INTRAVENOUS

## 2015-10-07 MED ORDER — SODIUM CHLORIDE 0.9 % IV SOLN
INTRAVENOUS | Status: DC
  Administered 2015-10-07 – 2015-10-09 (×2): via INTRAVENOUS

## 2015-10-07 MED ORDER — FENTANYL CITRATE (PF) 100 MCG/2ML IJ SOLN
50.0000 ug | Freq: Once | INTRAMUSCULAR | Status: AC
  Administered 2015-10-07: 50 ug via INTRAVENOUS

## 2015-10-07 MED ORDER — OXYBUTYNIN CHLORIDE 5 MG PO TABS: 5.0000 mg | ORAL_TABLET | Freq: Every day | ORAL | Status: DC | PRN

## 2015-10-07 MED ORDER — DEXAMETHASONE SODIUM PHOSPHATE 4 MG/ML IJ SOLN
INTRAMUSCULAR | Status: AC
  Filled 2015-10-07: qty 2

## 2015-10-07 MED ORDER — TIOTROPIUM BROMIDE MONOHYDRATE 18 MCG IN CAPS
18.0000 ug | ORAL_CAPSULE | Freq: Every day | RESPIRATORY_TRACT | Status: DC
  Administered 2015-10-08 – 2015-10-10 (×3): 18 ug via RESPIRATORY_TRACT
  Filled 2015-10-07: qty 5

## 2015-10-07 MED ORDER — LORATADINE 10 MG PO TABS: 10.0000 mg | ORAL_TABLET | Freq: Every day | ORAL | Status: DC

## 2015-10-07 MED ORDER — FENTANYL CITRATE (PF) 250 MCG/5ML IJ SOLN
INTRAMUSCULAR | Status: AC
  Filled 2015-10-07: qty 5

## 2015-10-07 MED ORDER — ONDANSETRON 4 MG PO TBDP
4.0000 mg | ORAL_TABLET | Freq: Four times a day (QID) | ORAL | Status: DC | PRN
  Filled 2015-10-07: qty 1

## 2015-10-07 MED ORDER — MIDAZOLAM HCL 2 MG/2ML IJ SOLN
INTRAMUSCULAR | Status: AC
  Administered 2015-10-07: 1 mg via INTRAVENOUS
  Filled 2015-10-07: qty 2

## 2015-10-07 MED ORDER — BUPIVACAINE-EPINEPHRINE 0.25% -1:200000 IJ SOLN: INTRAMUSCULAR | Status: DC | PRN

## 2015-10-07 MED ORDER — ONDANSETRON HCL 4 MG/2ML IJ SOLN
INTRAMUSCULAR | Status: AC
  Filled 2015-10-07: qty 4

## 2015-10-07 MED ORDER — FAMOTIDINE 20 MG PO TABS
20.0000 mg | ORAL_TABLET | Freq: Every day | ORAL | Status: DC
  Administered 2015-10-08 – 2015-10-09 (×3): 20 mg via ORAL
  Filled 2015-10-07 (×3): qty 1

## 2015-10-07 MED ORDER — CHLORHEXIDINE GLUCONATE 4 % EX LIQD: 60.0000 mL | Freq: Once | CUTANEOUS | Status: DC

## 2015-10-07 MED ORDER — BUPIVACAINE-EPINEPHRINE (PF) 0.25% -1:200000 IJ SOLN
INTRAMUSCULAR | Status: AC
  Filled 2015-10-07: qty 30

## 2015-10-07 MED ORDER — SODIUM CHLORIDE 0.9 % IR SOLN: Status: DC | PRN

## 2015-10-07 MED ORDER — ROCURONIUM BROMIDE 50 MG/5ML IV SOLN
INTRAVENOUS | Status: AC
  Filled 2015-10-07: qty 1

## 2015-10-07 MED ORDER — POTASSIUM CHLORIDE CRYS ER 20 MEQ PO TBCR: 20.0000 meq | EXTENDED_RELEASE_TABLET | Freq: Every day | ORAL | Status: DC

## 2015-10-07 NOTE — H&P (Signed)
Shane Watson is an 70 y.o. male.   Chief Complaint: Right shoulder pain and shortness of breath HPI: 70 yo male s/p fall with resulting right shoulder injury.  Patient presented to the ED and had XRAYs and CT scan confirming a severely comminuted proximal humerus fracture.  Patient has also complained of shortness of breath and was scheduled to see his cardiologist Dr [name] today however the cards office cancelled the appointment today.  The patient was scheduled to have a reverse total shoulder replacement this evening for treatment of this fracture, however due to the lack of information concerning the condition of the heart and the presence of signs and symptoms of CHF, the case was cancelled by mutual consent of ortho surgeon, family, and anesthesia.  Plan admission and cardiology evaluation and clearance prior to surgery.  Past Medical History  Diagnosis Date  . Allergy   . Anemia   . Neuropathy ([place])   . COPD (chronic obstructive pulmonary disease) ([place])   . Hyperthyroidism   . Hyperlipidemia   . Vertigo   . Coronary artery disease   . Heart murmur   . Diabetes mellitus without complication ([place])   . Complication of anesthesia   . PONV (postoperative nausea and vomiting)   . Sleep apnea   . Hypertension   . Shortness of breath dyspnea     with exertion  . History of kidney stones   . GERD (gastroesophageal reflux disease)     takes medication   . Chronic kidney disease     does not see a nephrologist  . BPH (benign prostatic hyperplasia)   . Arthritis   . Neuropathy [facility])     Past Surgical History  Procedure Laterality Date  . Cholecystectomy    . 2 heart stents  12/18/10  . Right knee surgery Right 2008ish    "scraped it out"  . Colonoscopy    . Cardiac catheterization      Family History  Problem Relation Age of Onset  . Diabetes Mother   . Heart disease Father   . Diabetes Father   . Heart disease Sister   . Diabetes Sister    Social History:  reports  that he quit smoking about 28 years ago. His smoking use included Cigarettes. He has a 22.5 pack-year smoking history. He has never used smokeless tobacco. He reports that he drinks alcohol. He reports that he does not use illicit drugs.  Allergies:  Allergies  Allergen Reactions  . Aspirin Swelling    Angioedema-must take EC coated form only  . Ibuprofen Swelling    Angioedema   . Penicillins Hives  . Sulfa Antibiotics Hives    Medications Prior to Admission  Medication Sig Dispense Refill  . acetaminophen (TYLENOL) 500 MG tablet Take 500 mg by mouth at bedtime.    Marland Kitchen albuterol (PROVENTIL HFA;VENTOLIN HFA) 108 (90 BASE) MCG/ACT inhaler Inhale 2 puffs into the lungs every 4 (four) hours as needed for wheezing or shortness of breath (((PLAN A))).     Marland Kitchen aspirin EC 81 MG tablet Take 81 mg by mouth daily.    Marland Kitchen atorvastatin (LIPITOR) 40 MG tablet Take 1 tablet by mouth at  bedtime 90 tablet 2  . budesonide-formoterol (SYMBICORT) 160-4.5 MCG/ACT inhaler Take 2 puffs first thing in am and then another 2 puffs about 12 hours later. 3 Inhaler 3  . cetirizine (ZYRTEC) 10 MG tablet Take 10 mg by mouth every morning.     . colesevelam (WELCHOL) 625 MG  tablet Take 1 tablet (625 mg total) by mouth daily with breakfast. 90 tablet 1  . doxazosin (CARDURA) 8 MG tablet Take 1 tablet by mouth  daily (Patient taking differently: Take 1 tablet by mouth  daily at bedtime) 90 tablet 1  . famotidine (PEPCID) 20 MG tablet Take 1 tablet by mouth at  bedtime 90 tablet 1  . furosemide (LASIX) 20 MG tablet Take 2 tablets by mouth  daily 180 tablet 1  . gabapentin (NEURONTIN) 100 MG capsule Take 300 mg by mouth 2 (two) times daily. 3 capsules by mouth twice daily    . linagliptin (TRADJENTA) 5 MG TABS tablet Take 5 mg by mouth daily.    Marland Kitchen losartan (COZAAR) 50 MG tablet Take 1 tablet by mouth  daily 90 tablet 3  . meclizine (ANTIVERT) 12.5 MG tablet Take 25 mg by mouth every morning. Reported on 08/27/2015    . Multiple  Vitamin (MULTIVITAMIN WITH MINERALS) TABS tablet Take 1 tablet by mouth at bedtime.     Marland Kitchen oxyCODONE-acetaminophen (PERCOCET/ROXICET) 5-325 MG tablet Take 2 tablets by mouth every 8 (eight) hours as needed for severe pain. 60 tablet 0  . pantoprazole (PROTONIX) 40 MG tablet Take 1 tablet by mouth  daily 90 tablet 3  . potassium chloride SA (K-DUR,KLOR-CON) 20 MEQ tablet Take 1 tablet by mouth  daily 90 tablet 3  . SPIRIVA RESPIMAT 2.5 MCG/ACT AERS INHALE 2 PUFFS INTO THE LUNGS EVERY MORNING 4 g 1  . albuterol (PROVENTIL) (5 MG/ML) 0.5% nebulizer solution Take 2.5 mg by nebulization every 4 (four) hours as needed for wheezing or shortness of breath (((PLAN B))).     Marland Kitchen Dextromethorphan-Guaifenesin (MUCINEX DM MAXIMUM STRENGTH) 60-1200 MG TB12 Take 1,200 mg by mouth 2 (two) times daily as needed (for congestion).     Marland Kitchen diltiazem (CARDIZEM) 60 MG tablet Take 1 tablet (60 mg total) by mouth 4 (four) times daily as needed. (Patient taking differently: Take 60 mg by mouth 4 (four) times daily as needed (for tachycardia). ) 30 tablet prn  . nitroGLYCERIN (NITROSTAT) 0.4 MG SL tablet Place 1 tablet (0.4 mg total) under the tongue every 5 (five) minutes as needed for chest pain. 25 tablet prn  . oxybutynin (DITROPAN) 5 MG tablet Take 5 mg by mouth daily as needed for bladder spasms.    . OXYGEN Inhale 2 L into the lungs at bedtime.    Marland Kitchen Respiratory Therapy Supplies (FLUTTER) DEVI Use as directed 1 each 0    Results for orders placed or performed during the hospital encounter of 10/07/15 (from the past 48 hour(s))  Glucose, capillary     Status: Abnormal   Collection Time: 10/07/15  2:44 PM  Result Value Ref Range   Glucose-Capillary 121 (H) 65 - 99 mg/dL  Glucose, capillary     Status: Abnormal   Collection Time: 10/07/15  5:44 PM  Result Value Ref Range   Glucose-Capillary 113 (H) 65 - 99 mg/dL   No results found.  ROS  Patient complains of SOB and back pain with ambulation   Blood pressure 131/55,  pulse 96, temperature 98.8 F (37.1 C), temperature source Oral, resp. rate 24, height 5' 11.5" (1.816 m), weight 145.151 kg (320 lb), SpO2 89 %. Physical Exam  Patient is currently laying supine on pre-op stretcher in minimal distress. HR 90's and BP 130s over 60s.  RR 22/min Pt using accessory muscles for respiration. Right shoulder is swollen and non tender now with the interscalene block in  place. NVI distally, good pulses and normally perfused extremity.  Bilateral lower extremity edema and erythema concerning for early cellulitis   Assessment/Plan Right displaced and comminuted proximal humerus fracture with potential congestive heart failure.  Patient appears to have an early cellulitis in both legs. Will postpone surgery until patient cleared by Cardiology.  I have spoken with the [facility] Cardiologist who has agreed to see Mr Allaire - thanks! Have posted the patient for surgery tomorrow afternoon at 12:30 pending clearance.  NPO after MN.  Augustin Schooling, MD 10/07/2015, 7:02 PM

## 2015-10-07 NOTE — Anesthesia Procedure Notes (Signed)
Anesthesia Regional Block:  Interscalene brachial plexus block  Pre-Anesthetic Checklist: ,, timeout performed, Correct Patient, Correct Site, Correct Laterality, Correct Procedure, Correct Position, site marked, Risks and benefits discussed,  Surgical consent,  Pre-op evaluation,  At surgeon's request and post-op pain management  Laterality: Right and Upper  Prep: chloraprep       Needles:  Injection technique: Single-shot  Needle Type: Echogenic Needle     Needle Length: 5cm 5 cm Needle Gauge: 21 and 21 G    Additional Needles:  Procedures: ultrasound guided (picture in chart) Interscalene brachial plexus block Narrative:  Start time: 10/07/2015 5:32 PM End time: 10/07/2015 5:37 PM Injection made incrementally with aspirations every 5 mL.  Performed by: Personally  Anesthesiologist: Etoy Mcdonnell     Right IS Block image

## 2015-10-07 NOTE — Progress Notes (Signed)
Report called to Apolonio Schneiders, RN on 5N

## 2015-10-07 NOTE — Progress Notes (Signed)
Late entry: Upon arrival patient reported that Dr. [name] was sick and unable to see patient today. Notified Dr. Marcell Barlow of this information.

## 2015-10-07 NOTE — Interval H&P Note (Signed)
History and Physical Interval Note:  10/07/2015 5:44 PM  Shane Watson  has presented today for surgery, with the diagnosis of RIGHT PROXIMAL HUMERUS FRACTURE  The various methods of treatment have been discussed with the patient and family. After consideration of risks, benefits and other options for treatment, the patient has consented to  Procedure(s): RIGHT REVERSE SHOULDER ARTHROPLASTY (Right) as a surgical intervention .  The patient's history has been reviewed, patient examined, no change in status, stable for surgery.  I have reviewed the patient's chart and labs.  Questions were answered to the patient's satisfaction.     Edith Groleau,STEVEN R

## 2015-10-07 NOTE — Progress Notes (Signed)
Attempted to call report

## 2015-10-07 NOTE — Progress Notes (Signed)
Pts admitted to unit without home medication orders. RN called answering service and received a phone call back from [place] who then gave verbal orders for RN to place and continue all home medication with the exception of plavix. Order carried out. Nursing will continue to monitor.

## 2015-10-07 NOTE — Anesthesia Preprocedure Evaluation (Addendum)
Anesthesia Evaluation  Patient identified by MRN, date of birth, ID band Patient awake    Reviewed: Allergy & Precautions, H&P , NPO status , Patient's Chart, lab work & pertinent test results  Airway Mallampati: II  TM Distance: >3 FB Neck ROM: Full    Dental  (+) Teeth Intact, Missing, Dental Advisory Given   Pulmonary sleep apnea, Continuous Positive Airway Pressure Ventilation and Oxygen sleep apnea , COPD,  COPD inhaler, former smoker,    breath sounds clear to auscultation       Cardiovascular hypertension, Pt. on medications + CAD   Rhythm:Regular Rate:Normal     Neuro/Psych    GI/Hepatic GERD  Medicated and Controlled,  Endo/Other  diabetes, Well Controlled, Type 2, Oral Hypoglycemic AgentsMorbid obesity  Renal/GU      Musculoskeletal   Abdominal   Peds  Hematology   Anesthesia Other Findings   Reproductive/Obstetrics                          Anesthesia Physical Anesthesia Plan  ASA: III  Anesthesia Plan: General   Post-op Pain Management: GA combined w/ Regional for post-op pain   Induction: Intravenous  Airway Management Planned: Oral ETT and Video Laryngoscope Planned  Additional Equipment:   Intra-op Plan:   Post-operative Plan: Extubation in OR and Possible Post-op intubation/ventilation  Informed Consent: I have reviewed the patients History and Physical, chart, labs and discussed the procedure including the risks, benefits and alternatives for the proposed anesthesia with the patient or authorized representative who has indicated his/her understanding and acceptance.   Dental advisory given  Plan Discussed with: CRNA, Anesthesiologist and Surgeon  Anesthesia Plan Comments: (Based on my assessment he has signs of current heart failure/volume overload. He has 2+ pitting edema, has labored breathing at rest currently, and has DOE with mild exertion. He endorses recent  anginal symptoms, and a event of significant tachycardia ("in the 140's") that his cardiologist is aware of but it doesn't sound like was worked up. There are no records from his cardiac workup in our system. He was scheduled to see his cardiologist today, but his cardiologist was sick and cancelled the appointment and told the patient he was fine for surgery.  I discussed my concerns at length with the patient and family at bedside. Dr. Veverly Fells arrived and all of [place] talked together and felt that the safest course of action is to delay surgery and allow for cardiology evaluation.)   Anesthesia Quick Evaluation

## 2015-10-07 NOTE — Consult Note (Signed)
Cardiology Consult    Patient ID: Shane Watson MRN: [id number], DOB/AGE: [birth year]   Admit date: 10/07/2015   Primary Physician: Shirline Frees, MD Primary Cardiologist: [name]. Gillian Shields, MD   Patient Profile    70 y/o ? with a h/o CAD, HTN, HL, DM II, COPD, and morbid obesity who presented for right shoulder surgery today and we've been asked to eval preoperatively.  Past Medical History    Past Medical History  Diagnosis Date  . Allergy   . Anemia   . Neuropathy ([place])   . COPD (chronic obstructive pulmonary disease) ([place])     a. Uses Home O2 w/ exertion.  . Hyperthyroidism   . Hyperlipidemia   . Vertigo   . Coronary artery disease     a. 11/2010 Cath: LM nl, LAD 47m (3.5x22 and 3.5x12 Resolute DES'), LCX nl, OM1/2/3 nl, RCA 20-30p, PDA nl.   . Heart murmur   . Diabetes mellitus without complication ([place])   . Complication of anesthesia   . PONV (postoperative nausea and vomiting)   . Hypertensive heart disease   . History of kidney stones   . GERD (gastroesophageal reflux disease)   . CKD (chronic kidney disease), stage III   . BPH (benign prostatic hyperplasia)   . Arthritis   . Neuropathy ([place])   . OSA (obstructive sleep apnea)     a. Uses CPAP.  Marland Kitchen H/O echocardiogram     a. per notes in 11/2010 - nl EF, basal inf wma.    Past Surgical History  Procedure Laterality Date  . Cholecystectomy    . 2 heart stents  12/18/10  . Right knee surgery Right 2008ish    "scraped it out"  . Colonoscopy    . Cardiac catheterization       Allergies  Allergies  Allergen Reactions  . Aspirin Swelling    Angioedema-must take EC coated form only  . Ibuprofen Swelling    Angioedema   . Penicillins Hives  . Sulfa Antibiotics Hives    History of Present Illness    70 y/o ? with the above complex PMH.  He has a h/o CAD and is s/p PCI/DES x 2 to the LAD in 11/2010.  He also has a h/o HTN, HL, DM II, COPD on home O2, OSA on cpap, morbid obesity, CKD III, and palpitations.  He  was last seen by Dr. [name] earlier this year and was doing well though did have complaints of intermittent tachypalpitations.  He was Rx prn dilt and says that he hasn't had to take any since January.  At baseline, he can usually walk 75 ft to his curb and back, at a brisk pace w/o supplemental O2 without experiencing significant dyspnea.  He also denies any recent c/p, pnd, or orthopnea, though he does have chronic bilat LEE, which has been stable so long as he takes his daily dose of lasix.  On 3/15, following a physical with his PCP, he was walking between two cars when he turned, lost his balance, and then tripped over a curb, coming down hard on his right shoulder.  He immediately noted pain and deformity of his right arm and presented to the [facility] ED.  Here, he was found to have a R humeral fx.  After splinting, he was d/c'd from the ED and referred to ortho, whom he saw on 3/17 with plan for surgical correction today. Given cardiac hx, arrangements were made for preop f/u with Dr. [name] this AM but  this was cancelled 2/2 illness on the part of Dr. [name].  Pt then presented to [facility] for planned surgery and we've been asked to eval tonight.  He currently has no complaints.  Home Medications    Prior to Admission medications   Medication Sig Start Date End Date Taking? Authorizing Provider  acetaminophen (TYLENOL) 500 MG tablet Take 500 mg by mouth at bedtime.   Yes Historical Provider, MD  albuterol (PROVENTIL HFA;VENTOLIN HFA) 108 (90 BASE) MCG/ACT inhaler Inhale 2 puffs into the lungs every 4 (four) hours as needed for wheezing or shortness of breath (((PLAN A))).    Yes Historical Provider, MD  aspirin EC 81 MG tablet Take 81 mg by mouth daily.   Yes Historical Provider, MD  atorvastatin (LIPITOR) 40 MG tablet Take 1 tablet by mouth at  bedtime 07/25/15  Yes Jerline Pain, MD  budesonide-formoterol [facility]) 160-4.5 MCG/ACT inhaler Take 2 puffs first thing in am and then another 2 puffs about 12  hours later. 07/23/15  Yes Tanda Rockers, MD  cetirizine (ZYRTEC) 10 MG tablet Take 10 mg by mouth every morning.    Yes Historical Provider, MD  colesevelam (WELCHOL) 625 MG tablet Take 1 tablet (625 mg total) by mouth daily with breakfast. 07/05/14  Yes Jerline Pain, MD  doxazosin (CARDURA) 8 MG tablet Take 1 tablet by mouth  daily Patient taking differently: Take 1 tablet by mouth  daily at bedtime 01/17/15  Yes Jerline Pain, MD  famotidine (PEPCID) 20 MG tablet Take 1 tablet by mouth at  bedtime 07/25/15  Yes Tanda Rockers, MD  furosemide (LASIX) 20 MG tablet Take 2 tablets by mouth  daily 09/18/15  Yes Jerline Pain, MD  gabapentin (NEURONTIN) 100 MG capsule Take 300 mg by mouth 2 (two) times daily. 3 capsules by mouth twice daily   Yes Historical Provider, MD  linagliptin (TRADJENTA) 5 MG TABS tablet Take 5 mg by mouth daily.   Yes Historical Provider, MD  losartan (COZAAR) 50 MG tablet Take 1 tablet by mouth  daily 02/07/15  Yes Jerline Pain, MD  meclizine (ANTIVERT) 12.5 MG tablet Take 25 mg by mouth every morning. Reported on 08/27/2015   Yes Historical Provider, MD  Multiple Vitamin (MULTIVITAMIN WITH MINERALS) TABS tablet Take 1 tablet by mouth at bedtime.    Yes Historical Provider, MD  oxyCODONE-acetaminophen (PERCOCET/ROXICET) 5-325 MG tablet Take 2 tablets by mouth every 8 (eight) hours as needed for severe pain. 10/02/15  Yes Merrily Pew, MD  pantoprazole (PROTONIX) 40 MG tablet Take 1 tablet by mouth  daily 04/19/15  Yes Tanda Rockers, MD  potassium chloride SA (K-DUR,KLOR-CON) 20 MEQ tablet Take 1 tablet by mouth  daily 01/11/15  Yes Jerline Pain, MD  SPIRIVA RESPIMAT 2.5 MCG/ACT AERS INHALE 2 PUFFS INTO THE LUNGS EVERY MORNING 12/10/14  Yes Tammy S Parrett, NP  albuterol (PROVENTIL) (5 MG/ML) 0.5% nebulizer solution Take 2.5 mg by nebulization every 4 (four) hours as needed for wheezing or shortness of breath (((PLAN B))).     Historical Provider, MD  Dextromethorphan-Guaifenesin  (MUCINEX DM MAXIMUM STRENGTH) 60-1200 MG TB12 Take 1,200 mg by mouth 2 (two) times daily as needed (for congestion).     Historical Provider, MD  diltiazem (CARDIZEM) 60 MG tablet Take 1 tablet (60 mg total) by mouth 4 (four) times daily as needed. Patient taking differently: Take 60 mg by mouth 4 (four) times daily as needed (for tachycardia).  04/08/15   Elta Guadeloupe  Lurline Del, MD  nitroGLYCERIN (NITROSTAT) 0.4 MG SL tablet Place 1 tablet (0.4 mg total) under the tongue every 5 (five) minutes as needed for chest pain. 04/08/15   Jerline Pain, MD  oxybutynin (DITROPAN) 5 MG tablet Take 5 mg by mouth daily as needed for bladder spasms.    Historical Provider, MD  OXYGEN Inhale 2 L into the lungs at bedtime.    Historical Provider, MD  Respiratory Therapy Supplies (FLUTTER) DEVI Use as directed 02/04/15   Tanda Rockers, MD    Family History    Family History  Problem Relation Age of Onset  . Diabetes Mother   . Heart disease Father   . Diabetes Father   . Heart disease Sister   . Diabetes Sister     Social History    Social History   Social History  . Marital Status: Single    Spouse Name: N/A  . Number of Children: N/A  . Years of Education: N/A   Occupational History  . Retired     Academic librarian   Social History Main Topics  . Smoking status: Former Smoker -- 1.50 packs/day for 15 years    Types: Cigarettes    Quit date: 04/29/1987  . Smokeless tobacco: Never Used  . Alcohol Use: Yes     Comment: Rare  . Drug Use: No  . Sexual Activity: Not on file   Other Topics Concern  . Not on file   Social History Narrative     Review of Systems    General:  No chills, fever, night sweats or weight changes.  Cardiovascular:  No chest pain, +++ chronic dyspnea on exertion - wears O2 for higher levels of activity.  +++ chronic bilat LE edema - which has been stable.  No orthopnea, palpitations, paroxysmal nocturnal dyspnea. Dermatological: No rash, lesions/masses Respiratory: No cough,  chronic dyspnea Urologic: No hematuria, dysuria Abdominal:   No nausea, vomiting, diarrhea, bright red blood per rectum, melena, or hematemesis Neurologic:  No visual changes, wkns, changes in mental status. All other systems reviewed and are otherwise negative except as noted above.  Physical Exam    Blood pressure 131/55, pulse 96, temperature 98.8 F (37.1 C), temperature source Oral, resp. rate 24, height 5' 11.5" (1.816 m), weight 320 lb (145.151 kg), SpO2 89 %.  General: Pleasant, NAD Psych: Normal affect. Neuro: Alert and oriented X 3. Moves all extremities spontaneously. HEENT: Normal  Neck: Supple without bruits.  Unable to gauge JVP 2/2 girth. Lungs:  Resp regular and unlabored, diminished breath sounds bilat - no crackles. Heart: RRR, distant, no s3, s4, or murmurs. Abdomen: Soft, obese, non-tender, non-distended, BS + x 4.  Extremities: No clubbing, cyanosis.  1+ bilat LE edema with chronic venous stasis changes. DP/PT/Radials 1+ and equal bilaterally.  Labs    Lab Results  Component Value Date   WBC 6.1 10/02/2015   HGB 13.9 10/02/2015   HCT 43.4 10/02/2015   MCV 86.1 10/02/2015   PLT 97* 10/02/2015    Recent Labs Lab 10/02/15 1552  NA 143  K 4.3  CL 104  CO2 28  BUN 19  CREATININE 1.34*  CALCIUM 9.2  PROT 6.6  BILITOT 1.0  ALKPHOS 84  ALT 30  AST 30  GLUCOSE 175*    Radiology Studies    Dg Pelvis 1-2 Views  10/02/2015  CLINICAL DATA:  Status post fall.  Right hip pain. EXAM: PELVIS - 1-2 VIEW COMPARISON:  None. FINDINGS: Cortical irregularity of the  distal right femoral neck concerning for nondisplaced fracture. No pelvic bone lesions are seen. IMPRESSION: Cortical irregularity of the distal right femoral neck concerning for nondisplaced fracture. Recommend dedicated 2 view xray of the right hip. Electronically Signed   By: Kathreen Devoid   On: 10/02/2015 17:18   Dg Shoulder Right  10/02/2015  CLINICAL DATA:  Fall today with severe right shoulder pain.  EXAM: RIGHT SHOULDER - 2+ VIEW COMPARISON:  None. FINDINGS: AP and scapular Y-views of the right shoulder are provided. There is a markedly displaced/comminuted fracture centered within the right humeral neck, with probable involvement of the humeral head. There is at least mild impaction at the fracture site and there are large avulsion fracture fragments located superior to the humeral head. It is not possible to exclude additional fracture within the adjacent glenoid. No fracture seen within the body of the scapula. Overlying acromioclavicular joint space remains normally aligned. IMPRESSION: Displaced/comminuted fractures centered within the right humeral neck with probable involvement of the humeral head as well. At least some degree of impaction at the main fracture site and large avulsion fracture fragments located superior to the humeral head. Cannot exclude additional fracture within the adjacent glenoid. Main component of the humeral head remains grossly well positioned relative to the glenoid fossa. Electronically Signed   By: Franki Cabot M.D.   On: 10/02/2015 17:20   Ct Head Wo Contrast  10/02/2015  CLINICAL DATA:  Patient fell and has multiple head and facial lacerations with neck pain radiating into right shoulder. EXAM: CT HEAD WITHOUT CONTRAST CT MAXILLOFACIAL WITHOUT CONTRAST CT CERVICAL SPINE WITHOUT CONTRAST TECHNIQUE: Multidetector CT imaging of the head, cervical spine, and maxillofacial structures were performed using the standard protocol without intravenous contrast. Multiplanar CT image reconstructions of the cervical spine and maxillofacial structures were also generated. COMPARISON:  Head and cervical spine CT from 08/01/2009. Chest CT from 11/21/2014 and 10/20/2013. FINDINGS: CT HEAD FINDINGS There is no evidence for acute hemorrhage, hydrocephalus, mass lesion, or abnormal extra-axial fluid collection. No definite CT evidence for acute infarction. Diffuse loss of parenchymal volume  is consistent with atrophy. Patchy low attenuation in the deep hemispheric and periventricular white matter is nonspecific, but likely reflects chronic microvascular ischemic demyelination. The visualized paranasal sinuses and mastoid air cells are clear. No evidence for skull fracture CT MAXILLOFACIAL FINDINGS Mandible is intact. The temporomandibular joints are located. No evidence for zygomatic arch fracture. No maxillary sinus fracture. No medial or inferior orbital wall blowout fracture. Pterygoid plates are intact. No evidence for orbital rim fracture. The globes are symmetric in size and shape. Intra orbital fat is preserved bilaterally. CT CERVICAL SPINE FINDINGS Imaging was obtained from the skullbase through the T2 vertebral body. No evidence of fracture. No subluxation. There is diffuse loss of intervertebral disc height throughout the cervical spine, from the C2-3 level down to C7-T1 with endplate spurring seen at nearly all levels. The facets are well aligned bilaterally. Mild straightening of the normal cervical lordosis is evident. There is no prevertebral soft tissue swelling. 5 mm posterior left upper lobe pulmonary nodule along the major fissure is unchanged since prior chest CT. IMPRESSION: 1. No acute intracranial abnormality. 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 2. No skull fracture. 3. No evidence for fracture involving the bony anatomy of the face. 4. 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 5. 5 mm posterior left upper lobe pulmonary nodule, stable since CT scan of 10/20/2013, consistent with  benign process. Electronically Signed   By: Misty Stanley M.D.   On: 10/02/2015 17:14   Ct Cervical Spine Wo Contrast  10/02/2015  CLINICAL DATA:  Patient fell and has multiple head and facial lacerations with neck pain radiating into right shoulder. EXAM: CT HEAD WITHOUT CONTRAST CT MAXILLOFACIAL WITHOUT CONTRAST CT  CERVICAL SPINE WITHOUT CONTRAST TECHNIQUE: Multidetector CT imaging of the head, cervical spine, and maxillofacial structures were performed using the standard protocol without intravenous contrast. Multiplanar CT image reconstructions of the cervical spine and maxillofacial structures were also generated. COMPARISON:  Head and cervical spine CT from 08/01/2009. Chest CT from 11/21/2014 and 10/20/2013. FINDINGS: CT HEAD FINDINGS There is no evidence for acute hemorrhage, hydrocephalus, mass lesion, or abnormal extra-axial fluid collection. No definite CT evidence for acute infarction. Diffuse loss of parenchymal volume is consistent with atrophy. Patchy low attenuation in the deep hemispheric and periventricular white matter is nonspecific, but likely reflects chronic microvascular ischemic demyelination. The visualized paranasal sinuses and mastoid air cells are clear. No evidence for skull fracture CT MAXILLOFACIAL FINDINGS Mandible is intact. The temporomandibular joints are located. No evidence for zygomatic arch fracture. No maxillary sinus fracture. No medial or inferior orbital wall blowout fracture. Pterygoid plates are intact. No evidence for orbital rim fracture. The globes are symmetric in size and shape. Intra orbital fat is preserved bilaterally. CT CERVICAL SPINE FINDINGS Imaging was obtained from the skullbase through the T2 vertebral body. No evidence of fracture. No subluxation. There is diffuse loss of intervertebral disc height throughout the cervical spine, from the C2-3 level down to C7-T1 with endplate spurring seen at nearly all levels. The facets are well aligned bilaterally. Mild straightening of the normal cervical lordosis is evident. There is no prevertebral soft tissue swelling. 5 mm posterior left upper lobe pulmonary nodule along the major fissure is unchanged since prior chest CT. IMPRESSION: 1. No acute intracranial abnormality. Atrophy with chronic small vessel white matter ischemic  demyelination. 2. No skull fracture. 3. No evidence for fracture involving the bony anatomy of the face. 4. 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 5. 5 mm posterior left upper lobe pulmonary nodule, stable since CT scan of 10/20/2013, consistent with benign process. Electronically Signed   By: Misty Stanley M.D.   On: 10/02/2015 17:14   Ct Shoulder Right Wo Contrast  10/02/2015  CLINICAL DATA:  Status post fall onto top of right shoulder. Right humeral neck fracture. Further evaluation requested. Initial encounter. EXAM: CT OF THE RIGHT SHOULDER WITHOUT CONTRAST TECHNIQUE: Multidetector CT imaging was performed according to the standard protocol. Multiplanar CT image reconstructions were also generated. COMPARISON:  Right shoulder radiographs performed earlier today at 4:48 p.m. FINDINGS: There is a significantly comminuted fracture of the right humeral head and neck, concerning for a Neer four-part fracture. There is inferior subluxation of the largest superior humeral head fragment, with the medial edge abutting the glenoid fossa, and multiple large displaced fragments anteriorly, superiorly and inferiorly. The articular surface with the glenoid fossa is not definitely seen, and its fragment may be displaced either superiorly or inferiorly. There is mild impaction at the humeral neck. A hematoma is noted at the right glenohumeral joint space. Degenerative change is noted at the right acromioclavicular joint, with inferior osteophyte formation. No additional fractures are identified. Mild soft tissue injury is noted at the right axilla. The visualized portions of the right lung are grossly clear. The visualized portions of the mediastinum are grossly unremarkable.  The vasculature is not well assessed without contrast. There is focal kinking of the right axillary vein raises question for some degree of disruption, though this may reflect motion artifact. Would  correlate for any associated symptoms. IMPRESSION: 1. Significantly comminuted fracture of the right humeral head and neck, concerning for a four-part fracture. Inferior subluxation of the largest superior humeral head fragment, with the medial edge abutting the glenoid fossa, and multiple large displaced fragments anteriorly, superiorly and inferiorly. The articular surface of the the glenoid fossa is not definitely seen. This fragment may be displaced either superiorly or inferiorly. 2. Mild impaction noted at the humeral neck. 3. Hematoma at the right glenohumeral joint space. 4. Mild soft tissue injury at the right axilla. Focal kinking of the right axillary vein raises question for some degree of disruption, though this may simply reflect motion artifact. Would correlate for any associated symptoms. Electronically Signed   By: Garald Balding M.D.   On: 10/02/2015 20:36   Dg Hip Unilat With Pelvis 2-3 Views Right  10/02/2015  CLINICAL DATA:  70 year old male with history of trauma from a fall today while stepping up on top of of a curb. Right-sided hand pain. EXAM: DG HIP (WITH OR WITHOUT PELVIS) 2-3V RIGHT COMPARISON:  AP pelvis from earlier today. FINDINGS: There is no evidence of hip fracture or dislocation. There is no evidence of arthropathy or other focal bone abnormality. IMPRESSION: 1. No evidence of acute femoral neck fracture. The finding on the examination from earlier today appeared to be related to mach effect (artifact). Electronically Signed   By: Vinnie Langton M.D.   On: 10/02/2015 18:15   Ct Maxillofacial Wo Cm  10/02/2015  CLINICAL DATA:  Patient fell and has multiple head and facial lacerations with neck pain radiating into right shoulder. EXAM: CT HEAD WITHOUT CONTRAST CT MAXILLOFACIAL WITHOUT CONTRAST CT CERVICAL SPINE WITHOUT CONTRAST TECHNIQUE: Multidetector CT imaging of the head, cervical spine, and maxillofacial structures were performed using the standard protocol without  intravenous contrast. Multiplanar CT image reconstructions of the cervical spine and maxillofacial structures were also generated. COMPARISON:  Head and cervical spine CT from 08/01/2009. Chest CT from 11/21/2014 and 10/20/2013. FINDINGS: CT HEAD FINDINGS There is no evidence for acute hemorrhage, hydrocephalus, mass lesion, or abnormal extra-axial fluid collection. No definite CT evidence for acute infarction. Diffuse loss of parenchymal volume is consistent with atrophy. Patchy low attenuation in the deep hemispheric and periventricular white matter is nonspecific, but likely reflects chronic microvascular ischemic demyelination. The visualized paranasal sinuses and mastoid air cells are clear. No evidence for skull fracture CT MAXILLOFACIAL FINDINGS Mandible is intact. The temporomandibular joints are located. No evidence for zygomatic arch fracture. No maxillary sinus fracture. No medial or inferior orbital wall blowout fracture. Pterygoid plates are intact. No evidence for orbital rim fracture. The globes are symmetric in size and shape. Intra orbital fat is preserved bilaterally. CT CERVICAL SPINE FINDINGS Imaging was obtained from the skullbase through the T2 vertebral body. No evidence of fracture. No subluxation. There is diffuse loss of intervertebral disc height throughout the cervical spine, from the C2-3 level down to C7-T1 with endplate spurring seen at nearly all levels. The facets are well aligned bilaterally. Mild straightening of the normal cervical lordosis is evident. There is no prevertebral soft tissue swelling. 5 mm posterior left upper lobe pulmonary nodule along the major fissure is unchanged since prior chest CT. IMPRESSION: 1. No acute intracranial abnormality. 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  2. No skull fracture. 3. No evidence for fracture involving the bony anatomy of the face. 4. 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 5. 5 mm posterior left upper lobe pulmonary nodule, stable since CT scan of 10/20/2013, consistent with benign process. Electronically Signed   By: Misty Stanley M.D.   On: 10/02/2015 17:14    ECG & Cardiac Imaging    3/15 - Sinus Brady, 52, IVCD, no acute ST/T changes.  Assessment & Plan    1.  CAD:  S/p prior PCI/DES x 2 to the LAD in 11/2010.  Has done well since w/o chest pain.  He does have chronic DOE, which is likely multifactorial in the setting of O2 dependent COPD and deconditioning.  Given stability of Ss w/o c/p, he may proceed to surgery without any additional ischemic testing.  Cont asa, statin.  2.  COPD:  Followed closely by pulm - Dr. [name].  Uses supplemental O2 for exertion.  Cont spiriva/albuterol.  3.  R Humeral Fracture:  S/p mechanical fall on 3/15 with resultant right humeral fracture.  Presented for right reverse total shoulder today.  See #1.  No additional ischemic eval required.  4.  Hypertensive Heart Disease:  Stable on ARB.  5.  HL:  Cont statin throughout perioperative period.  6.  Morbid obesity:  Would benefit from outpt nutritional counseling.  Was prev enrolled in pulm rehab.  7.  OSA:  Uses cpap @ night.  8.  DM II:  Cont tradjenta.  Add SSI.  Signed, Murray Hodgkins, NP 10/07/2015, 7:32 PM   The patient was seen and examined, and I agree with the assessment and plan as documented above. Given the patient's overall symptomatic stability over time, I feel he can proceed with surgery on 10/08/15 as planned without any preoperative testing. Would continue ASA and statin therapy.  Kate Sable, MD, [facility]  10/07/2015 8:12 PM

## 2015-10-07 NOTE — Progress Notes (Signed)
Cardiology at bedside seeing patient. Cici, RN to transport patient to 5N after cardiology gets done seeing patient she in short stay prior to this RN leaving.

## 2015-10-08 ENCOUNTER — Inpatient Hospital Stay (HOSPITAL_COMMUNITY): Payer: Medicare Other | Admitting: Certified Registered Nurse Anesthetist

## 2015-10-08 ENCOUNTER — Encounter (HOSPITAL_COMMUNITY): Payer: Self-pay | Admitting: Certified Registered Nurse Anesthetist

## 2015-10-08 ENCOUNTER — Encounter (HOSPITAL_COMMUNITY)
Admission: RE | Disposition: A | Discharge status: Skilled Nursing Facility | Payer: Self-pay | Source: Ambulatory Visit | Attending: Orthopedic Surgery

## 2015-10-08 ENCOUNTER — Inpatient Hospital Stay (HOSPITAL_COMMUNITY): Payer: Medicare Other

## 2015-10-08 DIAGNOSIS — E1165 Type 2 diabetes mellitus with hyperglycemia: Secondary | ICD-10-CM | POA: Diagnosis not present

## 2015-10-08 DIAGNOSIS — R600 Localized edema: Secondary | ICD-10-CM

## 2015-10-08 DIAGNOSIS — Z96619 Presence of unspecified artificial shoulder joint: Secondary | ICD-10-CM

## 2015-10-08 HISTORY — PX: REVERSE SHOULDER ARTHROPLASTY: SHX5054

## 2015-10-08 LAB — GLUCOSE, CAPILLARY
GLUCOSE-CAPILLARY: 114 mg/dL — AB (ref 65–99)
Glucose-Capillary: 110 mg/dL — ABNORMAL HIGH (ref 65–99)

## 2015-10-08 LAB — BASIC METABOLIC PANEL
ANION GAP: 9 (ref 5–15)
BUN: 12 mg/dL (ref 6–20)
CO2: 28 mmol/L (ref 22–32)
Calcium: 8.6 mg/dL — ABNORMAL LOW (ref 8.9–10.3)
Chloride: 104 mmol/L (ref 101–111)
Creatinine, Ser: 1.22 mg/dL (ref 0.61–1.24)
GFR calc Af Amer: >60 mL/min (ref >60)
GFR calc non Af Amer: 58 mL/min — ABNORMAL LOW (ref >60)
GLUCOSE: 131 mg/dL — AB (ref 65–99)
POTASSIUM: 4.2 mmol/L (ref 3.5–5.1)
Sodium: 141 mmol/L (ref 135–145)

## 2015-10-08 LAB — CBC
HCT: 36.6 % — ABNORMAL LOW (ref 39.0–52.0)
HEMOGLOBIN: 11.3 g/dL — AB (ref 13.0–17.0)
MCH: 26.8 pg (ref 26.0–34.0)
MCHC: 30.9 g/dL (ref 30.0–36.0)
MCV: 86.7 fL (ref 78.0–100.0)
Platelets: 134 10*3/uL — ABNORMAL LOW (ref 150–400)
RBC: 4.22 MIL/uL (ref 4.22–5.81)
RDW: 14.5 % (ref 11.5–15.5)
WBC: 6.6 10*3/uL (ref 4.0–10.5)

## 2015-10-08 LAB — PROTIME-INR
INR: 1.23 (ref 0.00–1.49)
PROTHROMBIN TIME: 15.7 s — AB (ref 11.6–15.2)

## 2015-10-08 SURGERY — ARTHROPLASTY, SHOULDER, TOTAL, REVERSE
Anesthesia: General

## 2015-10-08 SURGERY — ARTHROPLASTY, SHOULDER, TOTAL, REVERSE
Anesthesia: Regional | Site: Shoulder | Laterality: Right

## 2015-10-08 MED ORDER — SUCCINYLCHOLINE CHLORIDE 20 MG/ML IJ SOLN
INTRAMUSCULAR | Status: DC | PRN
  Administered 2015-10-08: 100 mg via INTRAVENOUS

## 2015-10-08 MED ORDER — ACETAMINOPHEN 500 MG PO TABS
500.0000 mg | ORAL_TABLET | Freq: Every day | ORAL | Status: DC
  Filled 2015-10-08: qty 1

## 2015-10-08 MED ORDER — MENTHOL 3 MG MT LOZG: 1.0000 | LOZENGE | OROMUCOSAL | Status: DC | PRN

## 2015-10-08 MED ORDER — MECLIZINE HCL 25 MG PO TABS: 25.0000 mg | ORAL_TABLET | Freq: Every morning | ORAL | Status: DC

## 2015-10-08 MED ORDER — PHENOL 1.4 % MT LIQD: 1.0000 | OROMUCOSAL | Status: DC | PRN

## 2015-10-08 MED ORDER — HYDROMORPHONE HCL 1 MG/ML IJ SOLN
1.0000 mg | INTRAMUSCULAR | Status: DC | PRN
  Administered 2015-10-08 – 2015-10-09 (×4): 1 mg via INTRAVENOUS
  Filled 2015-10-08 (×4): qty 1

## 2015-10-08 MED ORDER — METHOCARBAMOL 500 MG PO TABS
500.0000 mg | ORAL_TABLET | Freq: Four times a day (QID) | ORAL | Status: DC | PRN
  Administered 2015-10-09 (×2): 500 mg via ORAL
  Filled 2015-10-08 (×2): qty 1

## 2015-10-08 MED ORDER — MUCINEX DM MAXIMUM STRENGTH 60-1200 MG PO TB12: 1200.0000 mg | ORAL_TABLET | Freq: Two times a day (BID) | ORAL | Status: DC | PRN

## 2015-10-08 MED ORDER — BUPIVACAINE-EPINEPHRINE 0.25% -1:200000 IJ SOLN
INTRAMUSCULAR | Status: DC | PRN
  Administered 2015-10-08: 9 mL

## 2015-10-08 MED ORDER — CLINDAMYCIN PHOSPHATE 600 MG/50ML IV SOLN
600.0000 mg | Freq: Four times a day (QID) | INTRAVENOUS | Status: AC
  Administered 2015-10-08 – 2015-10-09 (×3): 600 mg via INTRAVENOUS
  Filled 2015-10-08 (×4): qty 50

## 2015-10-08 MED ORDER — PHENYLEPHRINE HCL 10 MG/ML IJ SOLN
10.0000 mg | INTRAVENOUS | Status: DC | PRN
  Administered 2015-10-08: 10 ug/min via INTRAVENOUS

## 2015-10-08 MED ORDER — FENTANYL CITRATE (PF) 250 MCG/5ML IJ SOLN
INTRAMUSCULAR | Status: AC
  Filled 2015-10-08: qty 5

## 2015-10-08 MED ORDER — FENTANYL CITRATE (PF) 100 MCG/2ML IJ SOLN: 25.0000 ug | INTRAMUSCULAR | Status: DC | PRN

## 2015-10-08 MED ORDER — METOCLOPRAMIDE HCL 5 MG PO TABS: 5.0000 mg | ORAL_TABLET | Freq: Three times a day (TID) | ORAL | Status: DC | PRN

## 2015-10-08 MED ORDER — POLYETHYLENE GLYCOL 3350 17 G PO PACK: 17.0000 g | PACK | Freq: Every day | ORAL | Status: DC | PRN

## 2015-10-08 MED ORDER — DILTIAZEM HCL 60 MG PO TABS
60.0000 mg | ORAL_TABLET | Freq: Four times a day (QID) | ORAL | Status: DC | PRN
  Filled 2015-10-08: qty 1

## 2015-10-08 MED ORDER — PANTOPRAZOLE SODIUM 40 MG PO TBEC: 40.0000 mg | DELAYED_RELEASE_TABLET | Freq: Every day | ORAL | Status: DC

## 2015-10-08 MED ORDER — ONDANSETRON HCL 4 MG/2ML IJ SOLN: 4.0000 mg | Freq: Four times a day (QID) | INTRAMUSCULAR | Status: DC | PRN

## 2015-10-08 MED ORDER — SUGAMMADEX SODIUM 200 MG/2ML IV SOLN
INTRAVENOUS | Status: AC
  Filled 2015-10-08: qty 2

## 2015-10-08 MED ORDER — LINAGLIPTIN 5 MG PO TABS: 5.0000 mg | ORAL_TABLET | Freq: Every day | ORAL | Status: DC

## 2015-10-08 MED ORDER — ASPIRIN EC 81 MG PO TBEC
81.0000 mg | DELAYED_RELEASE_TABLET | Freq: Every day | ORAL | Status: DC
Start: 2015-10-08 — End: 2015-10-08

## 2015-10-08 MED ORDER — COLESEVELAM HCL 625 MG PO TABS: 625.0000 mg | ORAL_TABLET | Freq: Every day | ORAL | Status: DC

## 2015-10-08 MED ORDER — MIDAZOLAM HCL 2 MG/2ML IJ SOLN
INTRAMUSCULAR | Status: AC
  Filled 2015-10-08: qty 2

## 2015-10-08 MED ORDER — BUPIVACAINE-EPINEPHRINE (PF) 0.25% -1:200000 IJ SOLN
INTRAMUSCULAR | Status: AC
  Filled 2015-10-08: qty 30

## 2015-10-08 MED ORDER — OXYCODONE-ACETAMINOPHEN 5-325 MG PO TABS: 1.0000 | ORAL_TABLET | ORAL | Status: DC | PRN

## 2015-10-08 MED ORDER — FENTANYL CITRATE (PF) 100 MCG/2ML IJ SOLN
INTRAMUSCULAR | Status: AC
  Filled 2015-10-08: qty 2

## 2015-10-08 MED ORDER — FAMOTIDINE 20 MG PO TABS: 20.0000 mg | ORAL_TABLET | Freq: Two times a day (BID) | ORAL | Status: DC

## 2015-10-08 MED ORDER — FENTANYL CITRATE (PF) 100 MCG/2ML IJ SOLN
25.0000 ug | INTRAMUSCULAR | Status: DC | PRN
Start: 2015-10-08 — End: 2015-10-08

## 2015-10-08 MED ORDER — ALBUTEROL SULFATE (5 MG/ML) 0.5% IN NEBU: 2.5000 mg | INHALATION_SOLUTION | RESPIRATORY_TRACT | Status: DC | PRN

## 2015-10-08 MED ORDER — METHOCARBAMOL 1000 MG/10ML IJ SOLN
500.0000 mg | Freq: Four times a day (QID) | INTRAVENOUS | Status: DC | PRN
  Filled 2015-10-08: qty 5

## 2015-10-08 MED ORDER — LIDOCAINE HCL (CARDIAC) 20 MG/ML IV SOLN
INTRAVENOUS | Status: DC | PRN
  Administered 2015-10-08: 60 mg via INTRAVENOUS

## 2015-10-08 MED ORDER — ALBUTEROL SULFATE HFA 108 (90 BASE) MCG/ACT IN AERS: 2.0000 | INHALATION_SPRAY | RESPIRATORY_TRACT | Status: DC | PRN

## 2015-10-08 MED ORDER — LORATADINE 10 MG PO TABS
10.0000 mg | ORAL_TABLET | Freq: Every day | ORAL | Status: DC
  Administered 2015-10-09 – 2015-10-10 (×2): 10 mg via ORAL
  Filled 2015-10-08 (×2): qty 1

## 2015-10-08 MED ORDER — ATORVASTATIN CALCIUM 20 MG PO TABS: 20.0000 mg | ORAL_TABLET | Freq: Every day | ORAL | Status: DC

## 2015-10-08 MED ORDER — SODIUM CHLORIDE 0.9 % IR SOLN
Status: DC | PRN
  Administered 2015-10-08 (×2): 1000 mL

## 2015-10-08 MED ORDER — MOMETASONE FURO-FORMOTEROL FUM 200-5 MCG/ACT IN AERO
2.0000 | INHALATION_SPRAY | Freq: Two times a day (BID) | RESPIRATORY_TRACT | Status: DC
  Administered 2015-10-08 – 2015-10-10 (×4): 2 via RESPIRATORY_TRACT
  Filled 2015-10-08: qty 8.8

## 2015-10-08 MED ORDER — ADULT MULTIVITAMIN W/MINERALS CH: 1.0000 | ORAL_TABLET | Freq: Every day | ORAL | Status: DC

## 2015-10-08 MED ORDER — LACTATED RINGERS IV SOLN
INTRAVENOUS | Status: DC | PRN
  Administered 2015-10-08: 14:00:00 via INTRAVENOUS

## 2015-10-08 MED ORDER — ONDANSETRON HCL 4 MG PO TABS: 4.0000 mg | ORAL_TABLET | Freq: Four times a day (QID) | ORAL | Status: DC | PRN

## 2015-10-08 MED ORDER — LOSARTAN POTASSIUM 50 MG PO TABS
50.0000 mg | ORAL_TABLET | Freq: Every day | ORAL | Status: DC
  Administered 2015-10-09 – 2015-10-10 (×2): 50 mg via ORAL
  Filled 2015-10-08 (×2): qty 1

## 2015-10-08 MED ORDER — DOXAZOSIN MESYLATE 8 MG PO TABS: 8.0000 mg | ORAL_TABLET | Freq: Every day | ORAL | Status: DC

## 2015-10-08 MED ORDER — METOCLOPRAMIDE HCL 5 MG/ML IJ SOLN: 5.0000 mg | Freq: Three times a day (TID) | INTRAMUSCULAR | Status: DC | PRN

## 2015-10-08 MED ORDER — STERILE WATER FOR IRRIGATION IR SOLN
Status: DC | PRN
  Administered 2015-10-08: 1000 mL

## 2015-10-08 MED ORDER — PROPOFOL 10 MG/ML IV BOLUS
INTRAVENOUS | Status: DC | PRN
  Administered 2015-10-08: 110 mg via INTRAVENOUS

## 2015-10-08 MED ORDER — PROPOFOL 10 MG/ML IV BOLUS
INTRAVENOUS | Status: AC
  Filled 2015-10-08: qty 40

## 2015-10-08 MED ORDER — OXYBUTYNIN CHLORIDE 5 MG PO TABS: 5.0000 mg | ORAL_TABLET | Freq: Every day | ORAL | Status: DC | PRN

## 2015-10-08 MED ORDER — ACETAMINOPHEN 325 MG PO TABS: 650.0000 mg | ORAL_TABLET | Freq: Four times a day (QID) | ORAL | Status: DC | PRN

## 2015-10-08 MED ORDER — NITROGLYCERIN 0.4 MG SL SUBL: 0.4000 mg | SUBLINGUAL_TABLET | SUBLINGUAL | Status: DC | PRN

## 2015-10-08 MED ORDER — DOCUSATE SODIUM 100 MG PO CAPS
100.0000 mg | ORAL_CAPSULE | Freq: Two times a day (BID) | ORAL | Status: DC
  Administered 2015-10-08 – 2015-10-10 (×4): 100 mg via ORAL
  Filled 2015-10-08 (×4): qty 1

## 2015-10-08 MED ORDER — BISACODYL 10 MG RE SUPP: 10.0000 mg | Freq: Every day | RECTAL | Status: DC | PRN

## 2015-10-08 MED ORDER — METHOCARBAMOL 500 MG PO TABS: 500.0000 mg | ORAL_TABLET | Freq: Three times a day (TID) | ORAL | Status: DC | PRN

## 2015-10-08 MED ORDER — ALBUTEROL SULFATE (2.5 MG/3ML) 0.083% IN NEBU: 2.5000 mg | INHALATION_SOLUTION | RESPIRATORY_TRACT | Status: DC | PRN

## 2015-10-08 MED ORDER — SODIUM CHLORIDE 0.9 % IV SOLN: INTRAVENOUS | Status: DC

## 2015-10-08 MED ORDER — ACETAMINOPHEN 650 MG RE SUPP: 650.0000 mg | Freq: Four times a day (QID) | RECTAL | Status: DC | PRN

## 2015-10-08 MED ORDER — OXYCODONE-ACETAMINOPHEN 5-325 MG PO TABS
2.0000 | ORAL_TABLET | Freq: Three times a day (TID) | ORAL | Status: DC | PRN
  Administered 2015-10-08 – 2015-10-10 (×4): 2 via ORAL
  Filled 2015-10-08 (×4): qty 2

## 2015-10-08 MED ORDER — ROPIVACAINE HCL 5 MG/ML IJ SOLN
INTRAMUSCULAR | Status: DC | PRN
  Administered 2015-10-08: 30 mL via PERINEURAL

## 2015-10-08 MED ORDER — FUROSEMIDE 20 MG PO TABS
20.0000 mg | ORAL_TABLET | Freq: Every day | ORAL | Status: DC
  Administered 2015-10-09 – 2015-10-10 (×2): 20 mg via ORAL
  Filled 2015-10-08 (×2): qty 1

## 2015-10-08 MED ORDER — ONDANSETRON HCL 4 MG/2ML IJ SOLN
INTRAMUSCULAR | Status: DC | PRN
  Administered 2015-10-08: 4 mg via INTRAVENOUS

## 2015-10-08 MED ORDER — POTASSIUM CHLORIDE CRYS ER 20 MEQ PO TBCR
20.0000 meq | EXTENDED_RELEASE_TABLET | Freq: Every day | ORAL | Status: DC
  Administered 2015-10-08 – 2015-10-10 (×3): 20 meq via ORAL
  Filled 2015-10-08 (×3): qty 1

## 2015-10-08 MED ORDER — GABAPENTIN 300 MG PO CAPS
300.0000 mg | ORAL_CAPSULE | Freq: Two times a day (BID) | ORAL | Status: DC
  Administered 2015-10-08 – 2015-10-10 (×4): 300 mg via ORAL
  Filled 2015-10-08 (×4): qty 1

## 2015-10-08 MED ORDER — FENTANYL CITRATE (PF) 100 MCG/2ML IJ SOLN
INTRAMUSCULAR | Status: DC | PRN
  Administered 2015-10-08: 50 ug via INTRAVENOUS

## 2015-10-08 SURGICAL SUPPLY — 75 items
BIT DRILL 170X2.5X (BIT) IMPLANT
BIT DRILL 5/64X5 DISP (BIT) ×2 IMPLANT
BIT DRL 170X2.5X (BIT)
BLADE SAG 18X100X1.27 (BLADE) ×2 IMPLANT
BOWL SMART MIX CTS (DISPOSABLE) ×1 IMPLANT
CAPT SHLDR REVTOTAL 1 ×1 IMPLANT
CEMENT BONE DEPUY (Cement) ×1 IMPLANT
COVER SURGICAL LIGHT HANDLE (MISCELLANEOUS) ×2 IMPLANT
DECANTER SPIKE VIAL GLASS SM (MISCELLANEOUS) ×1 IMPLANT
DRAPE IMP U-DRAPE 54X76 (DRAPES) ×4 IMPLANT
DRAPE INCISE IOBAN 66X45 STRL (DRAPES) ×2 IMPLANT
DRAPE ORTHO SPLIT 77X108 STRL (DRAPES) ×4
DRAPE SURG ORHT 6 SPLT 77X108 (DRAPES) ×2 IMPLANT
DRAPE U-SHAPE 47X51 STRL (DRAPES) ×2 IMPLANT
DRAPE X-RAY CASS 24X20 (DRAPES) IMPLANT
DRILL 2.5 (BIT)
DRSG ADAPTIC 3X8 NADH LF (GAUZE/BANDAGES/DRESSINGS) ×2 IMPLANT
DRSG PAD ABDOMINAL 8X10 ST (GAUZE/BANDAGES/DRESSINGS) ×2 IMPLANT
DURAPREP 26ML APPLICATOR (WOUND CARE) ×2 IMPLANT
ELECT BLADE 4.0 EZ CLEAN MEGAD (MISCELLANEOUS) ×2
ELECT NDL TIP 2.8 STRL (NEEDLE) ×1 IMPLANT
ELECT NEEDLE TIP 2.8 STRL (NEEDLE) ×2 IMPLANT
ELECT REM PT RETURN 9FT ADLT (ELECTROSURGICAL) ×2
ELECTRODE BLDE 4.0 EZ CLN MEGD (MISCELLANEOUS) ×1 IMPLANT
ELECTRODE REM PT RTRN 9FT ADLT (ELECTROSURGICAL) ×1 IMPLANT
GAUZE SPONGE 4X4 12PLY STRL (GAUZE/BANDAGES/DRESSINGS) ×2 IMPLANT
GLOVE BIOGEL PI IND STRL 6.5 (GLOVE) IMPLANT
GLOVE BIOGEL PI INDICATOR 6.5 (GLOVE) ×1
GLOVE BIOGEL PI ORTHO PRO 7.5 (GLOVE) ×1
GLOVE BIOGEL PI ORTHO PRO SZ8 (GLOVE) ×1
GLOVE ORTHO TXT STRL SZ7.5 (GLOVE) ×2 IMPLANT
GLOVE PI ORTHO PRO STRL 7.5 (GLOVE) ×1 IMPLANT
GLOVE PI ORTHO PRO STRL SZ8 (GLOVE) ×1 IMPLANT
GLOVE SS N UNI LF 6.0 STRL (GLOVE) ×1 IMPLANT
GLOVE SURG ORTHO 8.5 STRL (GLOVE) ×3 IMPLANT
GOWN STRL REUS W/ TWL LRG LVL3 (GOWN DISPOSABLE) ×1 IMPLANT
GOWN STRL REUS W/ TWL XL LVL3 (GOWN DISPOSABLE) ×2 IMPLANT
GOWN STRL REUS W/TWL LRG LVL3 (GOWN DISPOSABLE) ×4
GOWN STRL REUS W/TWL XL LVL3 (GOWN DISPOSABLE) ×4
HANDPIECE INTERPULSE COAX TIP (DISPOSABLE)
KIT BASIN OR (CUSTOM PROCEDURE TRAY) ×2 IMPLANT
KIT ROOM TURNOVER OR (KITS) ×2 IMPLANT
MANIFOLD NEPTUNE II (INSTRUMENTS) ×2 IMPLANT
NDL 1/2 CIR MAYO (NEEDLE) ×1 IMPLANT
NDL HYPO 25GX1X1/2 BEV (NEEDLE) ×1 IMPLANT
NEEDLE 1/2 CIR MAYO (NEEDLE) ×2 IMPLANT
NEEDLE HYPO 25GX1X1/2 BEV (NEEDLE) ×2 IMPLANT
NS IRRIG 1000ML POUR BTL (IV SOLUTION) ×2 IMPLANT
PACK SHOULDER (CUSTOM PROCEDURE TRAY) ×2 IMPLANT
PAD ARMBOARD 7.5X6 YLW CONV (MISCELLANEOUS) ×4 IMPLANT
PIN GUIDE 1.2 (PIN) IMPLANT
PIN GUIDE GLENOPHERE 1.5MX300M (PIN) IMPLANT
PIN METAGLENE 2.5 (PIN) IMPLANT
SET HNDPC FAN SPRY TIP SCT (DISPOSABLE) IMPLANT
SLING ARM FOAM STRAP XLG (SOFTGOODS) ×1 IMPLANT
SPONGE LAP 18X18 X RAY DECT (DISPOSABLE) ×1 IMPLANT
SPONGE LAP 4X18 X RAY DECT (DISPOSABLE) ×2 IMPLANT
STRIP CLOSURE SKIN 1/2X4 (GAUZE/BANDAGES/DRESSINGS) ×3 IMPLANT
SUCTION FRAZIER HANDLE 10FR (MISCELLANEOUS) ×1
SUCTION TUBE FRAZIER 10FR DISP (MISCELLANEOUS) ×1 IMPLANT
SUT BONE WAX W31G (SUTURE) ×1 IMPLANT
SUT FIBERWIRE #2 38 T-5 BLUE (SUTURE) ×16
SUT MNCRL AB 4-0 PS2 18 (SUTURE) ×2 IMPLANT
SUT VIC AB 0 CT1 27 (SUTURE) ×2
SUT VIC AB 0 CT1 27XBRD ANBCTR (SUTURE) IMPLANT
SUT VIC AB 2-0 CT1 27 (SUTURE) ×2
SUT VIC AB 2-0 CT1 TAPERPNT 27 (SUTURE) ×1 IMPLANT
SUT VICRYL 0 CT 1 36IN (SUTURE) ×2 IMPLANT
SUTURE FIBERWR #2 38 T-5 BLUE (SUTURE) ×2 IMPLANT
SYR CONTROL 10ML LL (SYRINGE) ×2 IMPLANT
TOWEL OR 17X24 6PK STRL BLUE (TOWEL DISPOSABLE) ×2 IMPLANT
TOWEL OR 17X26 10 PK STRL BLUE (TOWEL DISPOSABLE) ×2 IMPLANT
TOWER CARTRIDGE SMART MIX (DISPOSABLE) IMPLANT
WATER STERILE IRR 1000ML POUR (IV SOLUTION) ×2 IMPLANT
YANKAUER SUCT BULB TIP NO VENT (SUCTIONS) ×2 IMPLANT

## 2015-10-08 NOTE — Brief Op Note (Signed)
10/07/2015 - 10/08/2015  4:26 PM  PATIENT:  Shane Watson  70 y.o. male  PRE-OPERATIVE DIAGNOSIS:  Right shoulder displaced and comminuted proximal humerus fracture  POST-OPERATIVE DIAGNOSIS: Right shoulder displaced and comminuted proximal humerus fracture  PROCEDURE:  Procedure(s): REVERSE SHOULDER ARTHROPLASTY (Right) with tuberosity repair  SURGEON:  Surgeon(s) and Role:    * Netta Cedars, MD - Primary  PHYSICIAN ASSISTANT:   ASSISTANTS: Ventura Bruns, PA-C   ANESTHESIA:   regional and general  EBL:  Total I/O In: 600 [I.V.:600] Out: 200 [Blood:200]  BLOOD ADMINISTERED:none  DRAINS: none   LOCAL MEDICATIONS USED:  MARCAINE     SPECIMEN:  No Specimen  DISPOSITION OF SPECIMEN:  N/A  COUNTS:  YES  TOURNIQUET:  * No tourniquets in log *  DICTATION: .Other Dictation: Dictation Number [id number]  PLAN OF CARE: Admit to inpatient   PATIENT DISPOSITION:  PACU - hemodynamically stable.   Delay start of Pharmacological VTE agent (>24hrs) due to surgical blood loss or risk of bleeding: no

## 2015-10-08 NOTE — Discharge Instructions (Signed)
Minimal weight bearing and use of the right shoulder.  Ok to use for gentle ADLs (example hand to face)  Do exercises as instructed like lap slides and Pendulums 5 times per day  Ice to the shoulder as much as you can.  Keep the shoulder propped around in front of you with a pillow so your arm is across your waist  Keep the incision clean and dry and intact for one week, then ok to shower.  Follow up in the office in two weeks with Dr Veverly Fells [phone]

## 2015-10-08 NOTE — Interval H&P Note (Signed)
History and Physical Interval Note:  10/08/2015 1:43 PM  Shane Watson  has presented today for surgery, with the diagnosis of shoulder  The various methods of treatment have been discussed with the patient and family. After consideration of risks, benefits and other options for treatment, the patient has consented to  Procedure(s): REVERSE SHOULDER ARTHROPLASTY (Right) as a surgical intervention .  The patient's history has been reviewed, patient examined, no change in status, stable for surgery.  I have reviewed the patient's chart and labs.  Questions were answered to the patient's satisfaction.     Rieley Khalsa,STEVEN R

## 2015-10-08 NOTE — Progress Notes (Signed)
Pt. set up on CPAP@ 12 cmh20/2lpm oxygen added to circuit, using pts. Nasal mask, tolerating well, RT to monitor.

## 2015-10-08 NOTE — Progress Notes (Signed)
Patient Name: Shane Watson Date of Encounter: 10/08/2015  Hospital Problem List     Principal Problem:   Fracture, humerus, proximal Active Problems:   Coronary artery disease   Hypertensive heart disease   CKD (chronic kidney disease), stage III   COPD (chronic obstructive pulmonary disease) (HCC)   Morbid obesity (HCC)   Hyperlipidemia   Lower extremity edema    Subjective   No c/p.  Says he felt sob last night after coming back from bathroom and sitting upright in bed - says HOB was too high "90 degrees" and he thinks that his belly was pushing up and making it hard for him to breathe.  Immediately felt better once he dropped the [facility].  OTW slept well (relatively flat).  Eager for surgery this afternoon.  Inpatient Medications    . aspirin EC  81 mg Oral Daily  . atorvastatin  40 mg Oral QHS  . clindamycin (CLEOCIN) IV  600 mg Intravenous Q8H  . colesevelam  625 mg Oral Q breakfast  . doxazosin  8 mg Oral QHS  . famotidine  20 mg Oral QHS  . fluticasone furoate-vilanterol  1 puff Inhalation Daily  . gabapentin  300 mg Oral BID  . linagliptin  5 mg Oral Daily  . loratadine  10 mg Oral Daily  . losartan  50 mg Oral Daily  . meclizine  25 mg Oral q morning - 10a  . multivitamin with minerals  1 tablet Oral QHS  . pantoprazole  40 mg Oral Daily  . potassium chloride  20 mEq Oral Daily  . tiotropium  18 mcg Inhalation Daily    Vital Signs    Filed Vitals:   10/07/15 2046 10/08/15 0132 10/08/15 0540 10/08/15 0900  BP: 163/66 124/44 141/52 138/59  Pulse: 100 82 83   Temp: 99.2 F (37.3 C) 99.2 F (37.3 C) 99.2 F (37.3 C) 98.6 F (37 C)  TempSrc:    Oral  Resp: 24 21 20 20   Height:      Weight:      SpO2: 93% 94% 92% 93%    Intake/Output Summary (Last 24 hours) at 10/08/15 1107 Last data filed at 10/08/15 0800  Gross per 24 hour  Intake    240 ml  Output    150 ml  Net     90 ml   Filed Weights   10/07/15 1453  Weight: 320 lb (145.151 kg)     Physical Exam    General: Pleasant, NAD. Neuro: Alert and oriented X 3. Moves all extremities spontaneously. Psych: Normal affect. HEENT:  Normal  Neck: Supple without bruits.  Difficult to gauge JVP 2/2 girth. Lungs:  Resp regular and unlabored, diminished @ bases, otw CTA. Heart: RRR no s3, s4, or murmurs. Abdomen: Soft, non-tender, non-distended, BS + x 4.  Extremities: No clubbing, cyanosis or edema. DP/PT/Radials 2+ and equal bilaterally.  Labs    CBC  Recent Labs  10/08/15 0529  WBC 6.6  HGB 11.3*  HCT 36.6*  MCV 86.7  PLT [id number]*   Basic Metabolic Panel  Recent Labs  10/08/15 0529  NA 141  K 4.2  CL 104  CO2 28  GLUCOSE 131*  BUN 12  CREATININE 1.22  CALCIUM 8.6*    Telemetry    Sinus rhythm/sinus tach.  Radiology    Dg Pelvis 1-2 Views  10/02/2015  CLINICAL DATA:  Status post fall.  Right hip pain. EXAM: PELVIS - 1-2 VIEW COMPARISON:  None. FINDINGS:  Cortical irregularity of the distal right femoral neck concerning for nondisplaced fracture. No pelvic bone lesions are seen. IMPRESSION: Cortical irregularity of the distal right femoral neck concerning for nondisplaced fracture. Recommend dedicated 2 view xray of the right hip. Electronically Signed   By: Kathreen Devoid   On: 10/02/2015 17:18   Dg Shoulder Right  10/02/2015  CLINICAL DATA:  Fall today with severe right shoulder pain. EXAM: RIGHT SHOULDER - 2+ VIEW COMPARISON:  None. FINDINGS: AP and scapular Y-views of the right shoulder are provided. There is a markedly displaced/comminuted fracture centered within the right humeral neck, with probable involvement of the humeral head. There is at least mild impaction at the fracture site and there are large avulsion fracture fragments located superior to the humeral head. It is not possible to exclude additional fracture within the adjacent glenoid. No fracture seen within the body of the scapula. Overlying acromioclavicular joint space remains normally  aligned. IMPRESSION: Displaced/comminuted fractures centered within the right humeral neck with probable involvement of the humeral head as well. At least some degree of impaction at the main fracture site and large avulsion fracture fragments located superior to the humeral head. Cannot exclude additional fracture within the adjacent glenoid. Main component of the humeral head remains grossly well positioned relative to the glenoid fossa. Electronically Signed   By: Franki Cabot M.D.   On: 10/02/2015 17:20   Ct Head Wo Contrast  10/02/2015  CLINICAL DATA:  Patient fell and has multiple head and facial lacerations with neck pain radiating into right shoulder. EXAM: CT HEAD WITHOUT CONTRAST CT MAXILLOFACIAL WITHOUT CONTRAST CT CERVICAL SPINE WITHOUT CONTRAST TECHNIQUE: Multidetector CT imaging of the head, cervical spine, and maxillofacial structures were performed using the standard protocol without intravenous contrast. Multiplanar CT image reconstructions of the cervical spine and maxillofacial structures were also generated. COMPARISON:  Head and cervical spine CT from 08/01/2009. Chest CT from 11/21/2014 and 10/20/2013. FINDINGS: CT HEAD FINDINGS There is no evidence for acute hemorrhage, hydrocephalus, mass lesion, or abnormal extra-axial fluid collection. No definite CT evidence for acute infarction. Diffuse loss of parenchymal volume is consistent with atrophy. Patchy low attenuation in the deep hemispheric and periventricular white matter is nonspecific, but likely reflects chronic microvascular ischemic demyelination. The visualized paranasal sinuses and mastoid air cells are clear. No evidence for skull fracture CT MAXILLOFACIAL FINDINGS Mandible is intact. The temporomandibular joints are located. No evidence for zygomatic arch fracture. No maxillary sinus fracture. No medial or inferior orbital wall blowout fracture. Pterygoid plates are intact. No evidence for orbital rim fracture. The globes are  symmetric in size and shape. Intra orbital fat is preserved bilaterally. CT CERVICAL SPINE FINDINGS Imaging was obtained from the skullbase through the T2 vertebral body. No evidence of fracture. No subluxation. There is diffuse loss of intervertebral disc height throughout the cervical spine, from the C2-3 level down to C7-T1 with endplate spurring seen at nearly all levels. The facets are well aligned bilaterally. Mild straightening of the normal cervical lordosis is evident. There is no prevertebral soft tissue swelling. 5 mm posterior left upper lobe pulmonary nodule along the major fissure is unchanged since prior chest CT. IMPRESSION: 1. No acute intracranial abnormality. 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 2. No skull fracture. 3. No evidence for fracture involving the bony anatomy of the face. 4. 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 5. 5 mm posterior left upper lobe pulmonary nodule, stable since CT scan  of 10/20/2013, consistent with benign process. Electronically Signed   By: Misty Stanley M.D.   On: 10/02/2015 17:14   Ct Cervical Spine Wo Contrast  10/02/2015  CLINICAL DATA:  Patient fell and has multiple head and facial lacerations with neck pain radiating into right shoulder. EXAM: CT HEAD WITHOUT CONTRAST CT MAXILLOFACIAL WITHOUT CONTRAST CT CERVICAL SPINE WITHOUT CONTRAST TECHNIQUE: Multidetector CT imaging of the head, cervical spine, and maxillofacial structures were performed using the standard protocol without intravenous contrast. Multiplanar CT image reconstructions of the cervical spine and maxillofacial structures were also generated. COMPARISON:  Head and cervical spine CT from 08/01/2009. Chest CT from 11/21/2014 and 10/20/2013. FINDINGS: CT HEAD FINDINGS There is no evidence for acute hemorrhage, hydrocephalus, mass lesion, or abnormal extra-axial fluid collection. No definite CT evidence for acute  infarction. Diffuse loss of parenchymal volume is consistent with atrophy. Patchy low attenuation in the deep hemispheric and periventricular white matter is nonspecific, but likely reflects chronic microvascular ischemic demyelination. The visualized paranasal sinuses and mastoid air cells are clear. No evidence for skull fracture CT MAXILLOFACIAL FINDINGS Mandible is intact. The temporomandibular joints are located. No evidence for zygomatic arch fracture. No maxillary sinus fracture. No medial or inferior orbital wall blowout fracture. Pterygoid plates are intact. No evidence for orbital rim fracture. The globes are symmetric in size and shape. Intra orbital fat is preserved bilaterally. CT CERVICAL SPINE FINDINGS Imaging was obtained from the skullbase through the T2 vertebral body. No evidence of fracture. No subluxation. There is diffuse loss of intervertebral disc height throughout the cervical spine, from the C2-3 level down to C7-T1 with endplate spurring seen at nearly all levels. The facets are well aligned bilaterally. Mild straightening of the normal cervical lordosis is evident. There is no prevertebral soft tissue swelling. 5 mm posterior left upper lobe pulmonary nodule along the major fissure is unchanged since prior chest CT. IMPRESSION: 1. No acute intracranial abnormality. 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 2. No skull fracture. 3. No evidence for fracture involving the bony anatomy of the face. 4. 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 5. 5 mm posterior left upper lobe pulmonary nodule, stable since CT scan of 10/20/2013, consistent with benign process. Electronically Signed   By: Misty Stanley M.D.   On: 10/02/2015 17:14   Ct Shoulder Right Wo Contrast  10/02/2015  CLINICAL DATA:  Status post fall onto top of right shoulder. Right humeral neck fracture. Further evaluation requested. Initial encounter. EXAM:  CT OF THE RIGHT SHOULDER WITHOUT CONTRAST TECHNIQUE: Multidetector CT imaging was performed according to the standard protocol. Multiplanar CT image reconstructions were also generated. COMPARISON:  Right shoulder radiographs performed earlier today at 4:48 p.m. FINDINGS: There is a significantly comminuted fracture of the right humeral head and neck, concerning for a Neer four-part fracture. There is inferior subluxation of the largest superior humeral head fragment, with the medial edge abutting the glenoid fossa, and multiple large displaced fragments anteriorly, superiorly and inferiorly. The articular surface with the glenoid fossa is not definitely seen, and its fragment may be displaced either superiorly or inferiorly. There is mild impaction at the humeral neck. A hematoma is noted at the right glenohumeral joint space. Degenerative change is noted at the right acromioclavicular joint, with inferior osteophyte formation. No additional fractures are identified. Mild soft tissue injury is noted at the right axilla. The visualized portions of the right lung are grossly clear. The visualized portions of the  mediastinum are grossly unremarkable. The vasculature is not well assessed without contrast. There is focal kinking of the right axillary vein raises question for some degree of disruption, though this may reflect motion artifact. Would correlate for any associated symptoms. IMPRESSION: 1. Significantly comminuted fracture of the right humeral head and neck, concerning for a four-part fracture. Inferior subluxation of the largest superior humeral head fragment, with the medial edge abutting the glenoid fossa, and multiple large displaced fragments anteriorly, superiorly and inferiorly. The articular surface of the the glenoid fossa is not definitely seen. This fragment may be displaced either superiorly or inferiorly. 2. Mild impaction noted at the humeral neck. 3. Hematoma at the right glenohumeral joint  space. 4. Mild soft tissue injury at the right axilla. Focal kinking of the right axillary vein raises question for some degree of disruption, though this may simply reflect motion artifact. Would correlate for any associated symptoms. Electronically Signed   By: Garald Balding M.D.   On: 10/02/2015 20:36   Dg Hip Unilat With Pelvis 2-3 Views Right  10/02/2015  CLINICAL DATA:  70 year old male with history of trauma from a fall today while stepping up on top of of a curb. Right-sided hand pain. EXAM: DG HIP (WITH OR WITHOUT PELVIS) 2-3V RIGHT COMPARISON:  AP pelvis from earlier today. FINDINGS: There is no evidence of hip fracture or dislocation. There is no evidence of arthropathy or other focal bone abnormality. IMPRESSION: 1. No evidence of acute femoral neck fracture. The finding on the examination from earlier today appeared to be related to mach effect (artifact). Electronically Signed   By: Vinnie Langton M.D.   On: 10/02/2015 18:15   Ct Maxillofacial Wo Cm  10/02/2015  CLINICAL DATA:  Patient fell and has multiple head and facial lacerations with neck pain radiating into right shoulder. EXAM: CT HEAD WITHOUT CONTRAST CT MAXILLOFACIAL WITHOUT CONTRAST CT CERVICAL SPINE WITHOUT CONTRAST TECHNIQUE: Multidetector CT imaging of the head, cervical spine, and maxillofacial structures were performed using the standard protocol without intravenous contrast. Multiplanar CT image reconstructions of the cervical spine and maxillofacial structures were also generated. COMPARISON:  Head and cervical spine CT from 08/01/2009. Chest CT from 11/21/2014 and 10/20/2013. FINDINGS: CT HEAD FINDINGS There is no evidence for acute hemorrhage, hydrocephalus, mass lesion, or abnormal extra-axial fluid collection. No definite CT evidence for acute infarction. Diffuse loss of parenchymal volume is consistent with atrophy. Patchy low attenuation in the deep hemispheric and periventricular white matter is nonspecific, but likely  reflects chronic microvascular ischemic demyelination. The visualized paranasal sinuses and mastoid air cells are clear. No evidence for skull fracture CT MAXILLOFACIAL FINDINGS Mandible is intact. The temporomandibular joints are located. No evidence for zygomatic arch fracture. No maxillary sinus fracture. No medial or inferior orbital wall blowout fracture. Pterygoid plates are intact. No evidence for orbital rim fracture. The globes are symmetric in size and shape. Intra orbital fat is preserved bilaterally. CT CERVICAL SPINE FINDINGS Imaging was obtained from the skullbase through the T2 vertebral body. No evidence of fracture. No subluxation. There is diffuse loss of intervertebral disc height throughout the cervical spine, from the C2-3 level down to C7-T1 with endplate spurring seen at nearly all levels. The facets are well aligned bilaterally. Mild straightening of the normal cervical lordosis is evident. There is no prevertebral soft tissue swelling. 5 mm posterior left upper lobe pulmonary nodule along the major fissure is unchanged since prior chest CT. IMPRESSION: 1. No acute intracranial abnormality. Atrophy with chronic small vessel  white matter ischemic demyelination. 2. No skull fracture. 3. No evidence for fracture involving the bony anatomy of the face. 4. 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 5. 5 mm posterior left upper lobe pulmonary nodule, stable since CT scan of 10/20/2013, consistent with benign process. Electronically Signed   By: Misty Stanley M.D.   On: 10/02/2015 17:14    Assessment & Plan    1. CAD: No c/p overnight.  For surgery today.  S/p prior PCI/DES x 2 to the LAD in 11/2010. Has done well since w/o chest pain. He does have chronic DOE, which is likely multifactorial in the setting of O2 dependent COPD and deconditioning. Given stability of Ss w/o c/p, he may proceed to surgery without any additional ischemic testing.  Cont asa, statin.  2. COPD: Followed closely by pulm - Dr. [name]. Uses supplemental O2 for exertion. Cont spiriva/albuterol.  3. R Humeral Fracture: S/p mechanical fall on 3/15 with resultant right humeral fracture. Presented for right reverse total shoulder today. See #1. No additional ischemic eval required.  4. Hypertensive Heart Disease: Relatively stable on ARB.  5. HL: Cont statin throughout perioperative period.  6. Morbid obesity: Would benefit from outpt nutritional counseling. Was prev enrolled in pulm rehab.  7. OSA: Uses cpap @ night.  8. DM II: Cont tradjenta.   Signed, Murray Hodgkins NP

## 2015-10-08 NOTE — Anesthesia Preprocedure Evaluation (Signed)
Anesthesia Evaluation  Patient identified by MRN, date of birth, ID band Patient awake    Reviewed: Allergy & Precautions, H&P , NPO status , Patient's Chart, lab work & pertinent test results  History of Anesthesia Complications (+) PONV  Airway Mallampati: III  TM Distance: >3 FB Neck ROM: Full    Dental no notable dental hx. (+) Teeth Intact, Dental Advisory Given   Pulmonary shortness of breath and at rest, sleep apnea, Continuous Positive Airway Pressure Ventilation and Oxygen sleep apnea , COPD,  oxygen dependent, former smoker,    Pulmonary exam normal        Cardiovascular + CAD   Rhythm:Regular Rate:Normal     Neuro/Psych negative neurological ROS  negative psych ROS   GI/Hepatic Neg liver ROS, GERD  Medicated and Controlled,  Endo/Other  diabetes, Type 2, Oral Hypoglycemic AgentsMorbid obesity  Renal/GU negative Renal ROS  negative genitourinary   Musculoskeletal  (+) Arthritis , Osteoarthritis,    Abdominal   Peds  Hematology negative hematology ROS (+) anemia ,   Anesthesia Other Findings   Reproductive/Obstetrics negative OB ROS                             Anesthesia Physical Anesthesia Plan  ASA: IV  Anesthesia Plan: General and Regional   Post-op Pain Management: GA combined w/ Regional for post-op pain   Induction: Intravenous  Airway Management Planned: Oral ETT and Video Laryngoscope Planned  Additional Equipment:   Intra-op Plan:   Post-operative Plan: Extubation in OR  Informed Consent: I have reviewed the patients History and Physical, chart, labs and discussed the procedure including the risks, benefits and alternatives for the proposed anesthesia with the patient or authorized representative who has indicated his/her understanding and acceptance.   Dental advisory given  Plan Discussed with: CRNA  Anesthesia Plan Comments:         Anesthesia  Quick Evaluation

## 2015-10-08 NOTE — Anesthesia Postprocedure Evaluation (Signed)
Anesthesia Post Note  Patient: Shane Watson  Procedure(s) Performed: Procedure(s) (LRB): REVERSE SHOULDER ARTHROPLASTY (Right)  Patient location during evaluation: PACU Anesthesia Type: General and Regional Level of consciousness: awake and alert Pain management: pain level controlled Vital Signs Assessment: post-procedure vital signs reviewed and stable Respiratory status: spontaneous breathing, nonlabored ventilation and respiratory function stable (CPAP) Cardiovascular status: blood pressure returned to baseline and stable Postop Assessment: no signs of nausea or vomiting Anesthetic complications: no    Last Vitals:  Filed Vitals:   10/08/15 1653 10/08/15 1716  BP: 154/56 174/75  Pulse: 101 100  Temp: 37 C   Resp: 24 23    Last Pain:  Filed Vitals:   10/08/15 1733  PainSc: 0-No pain        RLE Motor Response: Purposeful movement (10/08/15 1730)        Faye Strohman,W. EDMOND

## 2015-10-08 NOTE — Transfer of Care (Signed)
Immediate Anesthesia Transfer of Care Note  Patient: Shane Watson  Procedure(s) Performed: Procedure(s): REVERSE SHOULDER ARTHROPLASTY (Right)  Patient Location: PACU  Anesthesia Type:General  Level of Consciousness: awake, alert , oriented, patient cooperative and responds to stimulation  Airway & Oxygen Therapy: Patient Spontanous Breathing and Patient connected to face mask oxygen  Post-op Assessment: Report given to RN, Post -op Vital signs reviewed and stable and Patient moving all extremities X 4  Post vital signs: Reviewed and stable  Last Vitals:  Filed Vitals:   10/08/15 0540 10/08/15 0900  BP: 141/52 138/59  Pulse: 83   Temp: 37.3 C 37 C  Resp: 20 20    Complications: No apparent anesthesia complications

## 2015-10-08 NOTE — NC FL2 (Signed)
[place] LEVEL OF CARE SCREENING TOOL     IDENTIFICATION  Patient Name: Shane Watson Birthdate: 1945-10-01 Sex: male Admission Date (Current Location): 10/07/2015  [facility] and [place] Number:  Herbalist and Address:  [facility], [place] [address] [place], [place] [address]      Provider Number: [id number]  Attending Physician Name and Address:  Netta Cedars, MD  Relative Name and Phone Number:       Current Level of Care: Hospital Recommended Level of Care: [place] Prior Approval Number:    Date Approved/Denied:   PASRR Number:   [id number]  Discharge Plan: SNF    Current Diagnoses: Patient Active Problem List   Diagnosis Date Noted  . Morbid obesity ([place]) 10/08/2015  . Lower extremity edema 10/08/2015  . Fracture, humerus, proximal 10/07/2015  . Coronary artery disease   . Hypertensive heart disease   . CKD (chronic kidney disease), stage III   . Hyperlipidemia   . COPD (chronic obstructive pulmonary disease) ([place])   . Severe obesity (BMI >= 40) ([place]) 02/25/2015  . Dyspnea 02/04/2015  . CKD (chronic kidney disease) 10/09/2014  . Coronary artery disease due to lipid rich plaque 10/09/2014  . Chronic respiratory failure with hypercapnia ([place]) 05/08/2014  . Cellulitis of left lower extremity 02/20/2014  . Leukocytosis, unspecified 02/20/2014  . COPD exacerbation ([place]) 02/20/2014  . Dyslipidemia 02/20/2014  . CKD (chronic kidney disease) stage 4, GFR 15-29 ml/min (HCC) 02/20/2014  . Multiple pulmonary nodules 11/01/2013  . COPD  GOLD II with reversibilty  05/03/2013  . Obstructive sleep apnea 05/02/2013    Orientation RESPIRATION BLADDER Height & Weight     Self, Time, Situation, Place  O2, Other (Comment) (currently on CPAP- O2 needs to be determined closer to time of discharge) Continent Weight: (!) 320 lb (145.151 kg) Height:  5' 11.5" (181.6 cm)  BEHAVIORAL SYMPTOMS/MOOD NEUROLOGICAL  BOWEL NUTRITION STATUS      Continent Diet (please see discharge summary for dietary needs)  AMBULATORY STATUS COMMUNICATION OF NEEDS Skin    (to be assessed by PT) Verbally Surgical wounds                       Personal Care Assistance Level of Assistance  Dressing, Bathing Bathing Assistance: Limited assistance   Dressing Assistance: Limited assistance     Functional Limitations Info             SPECIAL CARE FACTORS FREQUENCY  OT (By licensed OT), PT (By licensed PT)                    Contractures Contractures Info: Not present    Additional Factors Info  Code Status, Allergies Code Status Info: FULL Allergies Info: asprin, ibuprofin, penicillins, sulfa antibiotics           Current Medications (10/08/2015):  This is the current hospital active medication list Current Facility-Administered Medications  Medication Dose Route Frequency Provider Last Rate Last Dose  . 0.9 %  sodium chloride infusion   Intravenous Continuous Brad Dixon, PA-C 50 mL/hr at 10/07/15 2212    . albuterol (PROVENTIL) (2.5 MG/3ML) 0.083% nebulizer solution 3 mL  3 mL Inhalation Q4H PRN Brad Dixon, PA-C      . aspirin EC tablet 81 mg  81 mg Oral Daily Brad Dixon, PA-C      . atorvastatin (LIPITOR) tablet 40 mg  40 mg Oral QHS Merla Riches, PA-C  40 mg at 10/08/15 0022  . clindamycin (CLEOCIN) IVPB 600 mg  600 mg Intravenous Q8H Brad Dixon, PA-C   600 mg at 10/08/15 [id number]  . colesevelam [facility]) tablet 625 mg  625 mg Oral Q breakfast Brad Dixon, PA-C      . dextromethorphan-guaiFENesin (MUCINEX DM) 30-600 MG per 12 hr tablet 2 tablet  2 tablet Oral BID PRN Merla Riches, PA-C      . diltiazem (CARDIZEM) tablet 60 mg  60 mg Oral QID PRN Brad Dixon, PA-C      . doxazosin (CARDURA) tablet 8 mg  8 mg Oral QHS Brad Dixon, PA-C   8 mg at 10/08/15 0022  . famotidine (PEPCID) tablet 20 mg  20 mg Oral QHS Brad Dixon, PA-C   20 mg at 10/08/15 0022  . fluticasone furoate-vilanterol (BREO ELLIPTA)  200-25 MCG/INH 1 puff  1 puff Inhalation Daily Brad Dixon, PA-C   1 puff at 10/08/15 0828  . gabapentin (NEURONTIN) capsule 300 mg  300 mg Oral BID Merla Riches, PA-C   300 mg at 10/08/15 0022  . HYDROcodone-acetaminophen (NORCO/VICODIN) 5-325 MG per tablet 1-2 tablet  1-2 tablet Oral Q4H PRN Merla Riches, PA-C      . lactated ringers infusion   Intravenous Continuous Montez Hageman, MD 10 mL/hr at 10/07/15 1501    . linagliptin (TRADJENTA) tablet 5 mg  5 mg Oral Daily Brad Dixon, PA-C      . loratadine (CLARITIN) tablet 10 mg  10 mg Oral Daily Brad Dixon, PA-C      . losartan (COZAAR) tablet 50 mg  50 mg Oral Daily Brad Dixon, PA-C      . meclizine (ANTIVERT) tablet 25 mg  25 mg Oral q morning - 10a Brad Dixon, PA-C      . multivitamin with minerals tablet 1 tablet  1 tablet Oral QHS Brad Dixon, PA-C   1 tablet at 10/08/15 0022  . nitroGLYCERIN (NITROSTAT) SL tablet 0.4 mg  0.4 mg Sublingual Q5 min PRN Brad Dixon, PA-C      . ondansetron (ZOFRAN-ODT) disintegrating tablet 4 mg  4 mg Oral Q6H PRN Brad Dixon, PA-C       Or  . ondansetron (ZOFRAN) injection 4 mg  4 mg Intravenous Q6H PRN Brad Dixon, PA-C      . oxybutynin (DITROPAN) tablet 5 mg  5 mg Oral Daily PRN Brad Dixon, PA-C      . pantoprazole (PROTONIX) EC tablet 40 mg  40 mg Oral Daily Brad Dixon, PA-C      . potassium chloride SA (K-DUR,KLOR-CON) CR tablet 20 mEq  20 mEq Oral Daily Brad Dixon, PA-C      . senna-docusate (Senokot-S) tablet 1 tablet  1 tablet Oral QHS PRN Brad Dixon, PA-C      . tiotropium (SPIRIVA) inhalation capsule 18 mcg  18 mcg Inhalation Daily Brad Dixon, PA-C   18 mcg at 10/08/15 [id number]   Facility-Administered Medications Ordered in Other Encounters  Medication Dose Route Frequency Provider Last Rate Last Dose  . bupivacaine-epinephrine (MARCAINE W/ EPI) 0.5% -1:200000 injection    Anesthesia Intra-op Lorrene Reid, MD   25 mL at 10/07/15 1734     Discharge Medications: Please see discharge summary for a list of  discharge medications.  Relevant Imaging Results:  Relevant Lab Results:   Additional Information SSN: [id number]  [name], LCSW

## 2015-10-08 NOTE — Anesthesia Procedure Notes (Addendum)
Anesthesia Regional Block:  Interscalene brachial plexus block  Pre-Anesthetic Checklist: ,, timeout performed, Correct Patient, Correct Site, Correct Laterality, Correct Procedure, Correct Position, site marked, Risks and benefits discussed, pre-op evaluation,  At surgeon's request and post-op pain management  Laterality: Right  Prep: Maximum Sterile Barrier Precautions used and chloraprep       Needles:  Injection technique: Single-shot  Needle Type: Echogenic Stimulator Needle     Needle Length: 5cm 5 cm Needle Gauge: 22 and 22 G    Additional Needles:  Procedures: ultrasound guided (picture in chart) and nerve stimulator Interscalene brachial plexus block  Nerve Stimulator or Paresthesia:  Response: Biceps response,   Additional Responses:   Narrative:  Start time: 10/08/2015 12:25 PM End time: 10/08/2015 12:35 PM Injection made incrementally with aspirations every 5 mL. Anesthesiologist: Roderic Palau  Additional Notes: 2% Lidocaine skin wheel.    Procedure Name: Intubation Date/Time: 10/08/2015 2:10 PM Performed by: Tressia Miners LEFFEW Pre-anesthesia Checklist: Patient identified, Patient being monitored, Timeout performed, Emergency Drugs available and Suction available Patient Re-evaluated:Patient Re-evaluated prior to inductionOxygen Delivery Method: Circle System Utilized Preoxygenation: Pre-oxygenation with 100% oxygen Intubation Type: IV induction Ventilation: Two handed mask ventilation required and Oral airway inserted - appropriate to patient size Laryngoscope Size: Glidescope Grade View: Grade I Tube type: Oral Tube size: 7.0 mm Number of attempts: 1 Airway Equipment and Method: Video-laryngoscopy Placement Confirmation: ETT inserted through vocal cords under direct vision,  positive ETCO2 and breath sounds checked- equal and bilateral Secured at: 21 cm Tube secured with: Tape Dental Injury: Teeth and Oropharynx as per pre-operative  assessment

## 2015-10-08 NOTE — Clinical Social Work Note (Signed)
CSW received consult for SNF placement.  Assessment completed (full assessment to follow).  Patient would like to receive STR at [facility] SN[facility].  Nonnie Done, LCSW [phone]  5N1-9; 2S 15-16 and [place] Licensed Clinical Social Worker

## 2015-10-08 NOTE — Progress Notes (Signed)
Orthopedics Progress Note  Subjective: Patient reports pain in the right shoulder  Objective:  Filed Vitals:   10/08/15 0132 10/08/15 0540  BP: 124/44 141/52  Pulse: 82 83  Temp: 99.2 F (37.3 C) 99.2 F (37.3 C)  Resp: 21 20    General: Awake and alert  Musculoskeletal: right shoulder with limited ROM due to pain, moderate swelling Neurovascularly intact  Lab Results  Component Value Date   WBC 6.6 10/08/2015   HGB 11.3* 10/08/2015   HCT 36.6* 10/08/2015   MCV 86.7 10/08/2015   PLT 134* 10/08/2015       Component Value Date/Time   NA 141 10/08/2015 0529   K 4.2 10/08/2015 0529   CL 104 10/08/2015 0529   CO2 28 10/08/2015 0529   GLUCOSE 131* 10/08/2015 0529   BUN 12 10/08/2015 0529   CREATININE 1.22 10/08/2015 0529   CREATININE 1.51* 04/28/2013 1601   CALCIUM 8.6* 10/08/2015 0529   GFRNONAA 58* 10/08/2015 0529   GFRAA >60 10/08/2015 0529    Lab Results  Component Value Date   INR 1.23 10/08/2015   INR 1.18 10/02/2015    Assessment/Plan: Displaced and comminutedproximal humerus fracture, right shoulder. Patient cleared by cardiology last night.  I so appreciate their consult and input. RIGHT REVERSE SHOULDER ARTHROPLASTY Plan discharge to [facility] for rehab either Wednesday or Thursday  Remo Lipps R. Veverly Fells, MD 10/08/2015 7:48 AM

## 2015-10-08 NOTE — Clinical Social Work Placement (Signed)
   CLINICAL SOCIAL WORK PLACEMENT  NOTE  Date:  10/08/2015  Patient Details  Name: Shane Watson MRN: [id number] Date of Birth: 1946-02-16  Clinical Social Work is seeking post-discharge placement for this patient at the [place] level of care (*CSW will initial, date and re-position this form in  chart as items are completed):  Yes   Patient/family provided with [place] Work Department's list of facilities offering this level of care within the geographic area requested by the patient (or if unable, by the patient's family).  Yes   Patient/family informed of their freedom to choose among providers that offer the needed level of care, that participate in Medicare, Medicaid or managed care program needed by the patient, have an available bed and are willing to accept the patient.  Yes   Patient/family informed of [place] [facility]'s ownership interest in [facility] and [facility], as well as of the fact that they are under no obligation to receive care at these facilities.  PASRR submitted to EDS on 10/08/15     PASRR number received on 10/08/15     Existing PASRR number confirmed on       FL2 transmitted to all facilities in geographic area requested by pt/family on 10/08/15     FL2 transmitted to all facilities within larger geographic area on       Patient informed that his/her managed care company has contracts with or will negotiate with certain facilities, including the following:            Patient/family informed of bed offers received.  Patient chooses bed at [place] [place]     Physician recommends and patient chooses bed at      Patient to be transferred to   on  .  Patient to be transferred to facility by       Patient family notified on   of transfer.  Name of family member notified:        PHYSICIAN       Additional Comment:    _______________________________________________ [name],  LCSW 10/08/2015, 11:43 AM

## 2015-10-09 ENCOUNTER — Encounter (HOSPITAL_COMMUNITY): Payer: Self-pay | Admitting: General Practice

## 2015-10-09 DIAGNOSIS — I2510 Atherosclerotic heart disease of native coronary artery without angina pectoris: Secondary | ICD-10-CM

## 2015-10-09 LAB — BASIC METABOLIC PANEL
Anion gap: 12 (ref 5–15)
BUN: 14 mg/dL (ref 6–20)
CALCIUM: 8.6 mg/dL — AB (ref 8.9–10.3)
CO2: 26 mmol/L (ref 22–32)
CREATININE: 1.23 mg/dL (ref 0.61–1.24)
Chloride: 100 mmol/L — ABNORMAL LOW (ref 101–111)
GFR calc Af Amer: >60 mL/min (ref >60)
GFR, EST NON AFRICAN AMERICAN: 58 mL/min — AB (ref >60)
GLUCOSE: 139 mg/dL — AB (ref 65–99)
Potassium: 4.6 mmol/L (ref 3.5–5.1)
Sodium: 138 mmol/L (ref 135–145)

## 2015-10-09 LAB — GLUCOSE, CAPILLARY
Glucose-Capillary: 172 mg/dL — ABNORMAL HIGH (ref 65–99)
Glucose-Capillary: 142 mg/dL — ABNORMAL HIGH (ref 65–99)

## 2015-10-09 LAB — HEMOGLOBIN AND HEMATOCRIT, BLOOD
HEMATOCRIT: 37.2 % — AB (ref 39.0–52.0)
HEMOGLOBIN: 11.9 g/dL — AB (ref 13.0–17.0)

## 2015-10-09 NOTE — Evaluation (Addendum)
Occupational Therapy Evaluation Patient Details Name: Shane Watson MRN: [id number] DOB: [birth year] Today's Date: 10/09/2015    History of Present Illness The patient is a 70 y.o. male now s/p Rt shoulder reverse total shoulder arthroplasty due to a fall and proximal humerus fracture also admitted for SOB. PMH: anemia, neuropathy, COPD, vertigo, CAD, Diabetes, Chronic kidney disease, sleep apnea, cardiac catherterization.   Clinical Impression   This 70 yo male admitted and underwent above presents to acute OT with deficits below affecting his PLOF of independent with basic ADLs. He will benefit from acute OT with follow up OT at SNF to get to Mod I to return home.    Follow Up Recommendations  SNF    Equipment Recommendations   (TBD next venue)       Precautions / Restrictions Precautions Precautions: Shoulder Shoulder Interventions: Shoulder sling/immobilizer;Off for dressing/bathing/exercises Required Braces or Orthoses: Sling Restrictions Weight Bearing Restrictions: Yes RUE Weight Bearing: Non weight bearing      Mobility Bed Mobility   General bed mobility comments: Pt up in recliner upon arrival  Transfers Overall transfer level: Needs assistance Equipment used: None Transfers: Sit to/from Stand Sit to Stand: Min guard         General transfer comment: performed from bed and toilet. Pt using rail from toilet.          ADL Overall ADL's : Needs assistance/impaired Eating/Feeding: Set up Eating/Feeding Details (indicate cue type and reason): Issued pt a plate guard Grooming: Moderate assistance;Sitting   Upper Body Bathing: Moderate assistance;Sitting   Lower Body Bathing: Maximal assistance (min guard A sit<>stand)   Upper Body Dressing : Total assistance;Sitting   Lower Body Dressing: Total assistance (min guard A sit<>stand)   Toilet Transfer: Min guard;Ambulation (pushing IV pole. recliner>door>recliner>door>recliner)   Toileting- Clothing  Manipulation and Hygiene: Maximal assistance (min guard A sit<>stand)                         Pertinent Vitals/Pain Pain Assessment: Faces Pain Score: 5  Faces Pain Scale: Hurts little more Pain Location: right elbow Pain Descriptors / Indicators: Burning Pain Intervention(s): Monitored during session;Repositioned     Hand Dominance  right   Extremity/Trunk Assessment Upper Extremity Assessment Upper Extremity Assessment: RUE deficits/detail RUE Deficits / Details: s/p reverse total shoulder RUE Coordination: decreased gross motor   Lower Extremity Assessment Lower Extremity Assessment: Overall WFL for tasks assessed       Communication Communication Communication: No difficulties   Cognition Arousal/Alertness: Awake/alert Behavior During Therapy: WFL for tasks assessed/performed Overall Cognitive Status: Within Functional Limits for tasks assessed                           Shoulder Instructions Shoulder Instructions Correct positioning of sling/immobilizer: Maximal assistance ROM for elbow, wrist and digits of operated UE: Supervision/safety    Home Living Family/patient expects to be discharged to:: Skilled nursing facility Living Arrangements: Alone                                      Prior Functioning/Environment Level of Independence: Independent             OT Diagnosis: Generalized weakness;Acute pain   OT Problem List: Decreased strength;Decreased range of motion;Impaired UE functional use;Impaired balance (sitting and/or standing);Pain;Obesity;Decreased knowledge of precautions   OT Treatment/Interventions: Self-care/ADL training;Patient/family education;Balance  training;Therapeutic activities;Therapeutic exercise;DME and/or AE instruction    OT Goals(Current goals can be found in the care plan section) Acute Rehab OT Goals Patient Stated Goal: rehab then home OT Goal Formulation: With patient Time For Goal  Achievement: 10/16/15 Potential to Achieve Goals: Good  OT Frequency: Min 3X/week   Barriers to D/C: Decreased caregiver support             End of Session Equipment Utilized During Treatment:  (sling, O2 on 5liters)  Activity Tolerance: Patient tolerated treatment well Patient left: in chair;with call bell/phone within reach;with family/visitor present   Time: [id number] OT Time Calculation (min): 48 min Charges:  OT General Charges $OT Visit: 1 Procedure OT Evaluation $OT Eval Moderate Complexity: 1 Procedure OT Treatments $Self Care/Home Management : 23-37 mins  Almon Register  [id number]  10/09/2015, 2:34 PM

## 2015-10-09 NOTE — Progress Notes (Signed)
   Subjective: 1 Day Post-Op Procedure(s) (LRB): REVERSE SHOULDER ARTHROPLASTY (Right)  Pt c/o moderate pain through the night Denies any numbness or tingling distally Sling was causing some pain around his neck until removed Otherwise doing fair Patient reports pain as moderate.  Objective:   VITALS:   Filed Vitals:   10/08/15 2334 10/09/15 0146  BP:  141/59  Pulse:  93  Temp: 100.2 F (37.9 C) 99.3 F (37.4 C)  Resp:  20   Right shoulder dressing and sling in place No signs of bleeding or drainage nv intact distally  LABS  Recent Labs  10/08/15 0529  HGB 11.3*  HCT 36.6*  WBC 6.6  PLT 134*     Recent Labs  10/08/15 0529  NA 141  K 4.2  BUN 12  CREATININE 1.22  GLUCOSE 131*     Assessment/Plan: 1 Day Post-Op Procedure(s) (LRB): REVERSE SHOULDER ARTHROPLASTY (Right) PT/OT Pain management Pulmonary toilet Will plan for D/c hopefully to [facility] nursing facility tomorrow vs Friday depending on bed availability Patient in agreement    Merla Riches, MPAS, PA-C  10/09/2015, 8:22 AM

## 2015-10-09 NOTE — Progress Notes (Signed)
Patient Name: Shane Watson Date of Encounter: 10/09/2015  Hospital Problem List     Principal Problem:   Fracture, humerus, proximal Active Problems:   Coronary artery disease   Hypertensive heart disease   CKD (chronic kidney disease), stage III   COPD (chronic obstructive pulmonary disease) (HCC)   Morbid obesity (HCC)   Hyperlipidemia   Lower extremity edema   S/P shoulder replacement    Subjective   S/P surgery yesterday.  Has had right shoulder pain overnight.  Breathing stable.  Inpatient Medications    . acetaminophen  500 mg Oral QHS  . aspirin EC  81 mg Oral Daily  . atorvastatin  40 mg Oral QHS  . colesevelam  625 mg Oral Q breakfast  . docusate sodium  100 mg Oral BID  . doxazosin  8 mg Oral QHS  . famotidine  20 mg Oral QHS  . furosemide  20 mg Oral Daily  . gabapentin  300 mg Oral BID  . linagliptin  5 mg Oral Daily  . loratadine  10 mg Oral Daily  . losartan  50 mg Oral Daily  . meclizine  25 mg Oral q morning - 10a  . mometasone-formoterol  2 puff Inhalation BID  . multivitamin with minerals  1 tablet Oral QHS  . pantoprazole  40 mg Oral Daily  . potassium chloride SA  20 mEq Oral Daily  . tiotropium  18 mcg Inhalation Daily    Vital Signs    Filed Vitals:   10/08/15 2300 10/08/15 2334 10/09/15 0146 10/09/15 0805  BP: 132/55  141/59   Pulse: 95  93   Temp: 101 F (38.3 C) 100.2 F (37.9 C) 99.3 F (37.4 C)   TempSrc:   Oral   Resp: 20  20   Height:      Weight:      SpO2: 90%  90% 92%    Intake/Output Summary (Last 24 hours) at 10/09/15 0933 Last data filed at 10/09/15 0811  Gross per 24 hour  Intake    940 ml  Output    400 ml  Net    540 ml   Filed Weights   10/07/15 1453  Weight: 320 lb (145.151 kg)    Physical Exam    General: Pleasant, NAD. Neuro: Alert and oriented X 3. Moves all extremities spontaneously. Psych: Normal affect. HEENT: Normal Neck: Supple without bruits. Difficult to gauge JVP 2/2  girth. Lungs: Resp regular and unlabored, diminished @ bases, otw CTA. Heart: RRR no s3, s4, or murmurs. Abdomen: Soft, non-tender, non-distended, BS + x 4.  Extremities: No clubbing, cyanosis.  Trace to 1+ bilat LE edema. DP/PT/Radials 2+ and equal bilaterally.  Labs    CBC  Recent Labs  10/08/15 0529 10/09/15 0747  WBC 6.6  --   HGB 11.3* 11.9*  HCT 36.6* 37.2*  MCV 86.7  --   PLT 134*  --    Basic Metabolic Panel  Recent Labs  10/08/15 0529 10/09/15 0747  NA 141 138  K 4.2 4.6  CL 104 100*  CO2 28 26  GLUCOSE 131* 139*  BUN 12 14  CREATININE 1.22 1.23  CALCIUM 8.6* 8.6*   Telemetry    rsr  Assessment & Plan    1. CAD: S/p prior PCI/DES x 2 to the LAD in 11/2010. Had shoulder surgery 3/21.  Did well from cardiac standpoint.  No c/p.  Breathing stable. Cont asa, statin.  2. COPD: Stable.  Followed closely by  pulm - Dr. [name]. Uses supplemental O2 for exertion. Cont spiriva/albuterol.  3. R Humeral Fracture: S/p mechanical fall on 3/15 with resultant right humeral fracture.  S/P right reverse total shoulder on 3/21. Pain mgmt per IM.  Likely d/c to rehab w/in next 2 days.  4. Hypertensive Heart Disease: Stable on ARB.  5. HL: Cont statin throughout perioperative period.  6. Morbid obesity: Would benefit from outpt nutritional counseling. Was prev enrolled in pulm rehab.  7. OSA: Uses cpap @ night.  8. DM II: Cont tradjenta.   Signed, Murray Hodgkins NP

## 2015-10-09 NOTE — Evaluation (Addendum)
Physical Therapy Evaluation Patient Details Name: Shane Watson MRN: [id number] DOB: [birth year] Today's Date: 10/09/2015   History of Present Illness  The patient is a 70 y.o. male now s/p Rt shoulder reverse total shoulder arthroplasty due to a fall and proximal humerus fracture. PMH: anemia, neuropathy, COPD, vertigo, CAD, Diabetes, Chronic kidney disease, sleep apnea, cardiac catherterization.  Clinical Impression  Patient is s/p above surgery resulting in functional limitations due to the deficits listed below (see PT Problem List). The patient did ambulate 25 feet without an assistive device but maintaining min guard due to mild instability. Recommending short term SNF at [facility] for rehabilitation. PT to continue to follow and progress as tolerated.        Follow Up Recommendations SNF;Supervision for mobility/OOB    Equipment Recommendations  None recommended by PT (to be addressed at next level of care)    Recommendations for Other Services       Precautions / Restrictions Precautions Precautions: Shoulder Required Braces or Orthoses: Sling Restrictions Weight Bearing Restrictions: Yes RUE Weight Bearing: Non weight bearing      Mobility  Bed Mobility Overal bed mobility: Needs Assistance Bed Mobility: Supine to Sit     Supine to sit: Mod assist;HOB elevated     General bed mobility comments: HOB elevated, assist provided at trunk and scooting toward edge of bed.   Transfers Overall transfer level: Needs assistance Equipment used: None Transfers: Sit to/from Stand Sit to Stand: Min guard         General transfer comment: performed from bed and toilet. Pt using rail from toilet.   Ambulation/Gait Ambulation/Gait assistance: Min guard Ambulation Distance (Feet): 25 Feet Assistive device: None Gait Pattern/deviations: Step-through pattern Gait velocity: decreased   General Gait Details: maintaining wide base of support. Using O2 throughout session, SpO2  93% before ambulatino and 90% following. Assist of second person provided to help with lines.   Stairs            Wheelchair Mobility    Modified Rankin (Stroke Patients Only)       Balance Overall balance assessment: Needs assistance Sitting-balance support: No upper extremity supported Sitting balance-Leahy Scale: Good     Standing balance support: No upper extremity supported Standing balance-Leahy Scale: Fair                               Pertinent Vitals/Pain Pain Assessment: 0-10 Pain Score: 5  Pain Location: Rt shoulder Pain Descriptors / Indicators: Dull;Aching Pain Intervention(s): Limited activity within patient's tolerance;Monitored during session    Home Living Family/patient expects to be discharged to:: Skilled nursing facility Living Arrangements: Alone                    Prior Function Level of Independence: Independent               Hand Dominance        Extremity/Trunk Assessment   Upper Extremity Assessment: Defer to OT evaluation           Lower Extremity Assessment: Overall WFL for tasks assessed         Communication   Communication: No difficulties  Cognition Arousal/Alertness: Awake/alert Behavior During Therapy: WFL for tasks assessed/performed Overall Cognitive Status: Within Functional Limits for tasks assessed                      General Comments  Exercises        Assessment/Plan    PT Assessment Patient needs continued PT services  PT Diagnosis Difficulty walking   PT Problem List Decreased strength;Decreased activity tolerance;Decreased balance;Decreased mobility  PT Treatment Interventions Gait training;Stair training;Functional mobility training;Therapeutic activities;Therapeutic exercise;Patient/family education   PT Goals (Current goals can be found in the Care Plan section) Acute Rehab PT Goals Patient Stated Goal: go for more rehab before going home. PT Goal  Formulation: With patient Time For Goal Achievement: 10/23/15 Potential to Achieve Goals: Good    Frequency Min 3X/week   Barriers to discharge Decreased caregiver support      Co-evaluation               End of Session Equipment Utilized During Treatment: Gait belt;Oxygen Activity Tolerance: Patient tolerated treatment well Patient left: in chair;with call bell/phone within reach;with family/visitor present;Other (comment) (Rt UE supported, LEs elevated. ) Nurse Communication: Mobility status;Precautions    Functional Assessment Tool Used: clinical judgment Functional Limitation: Mobility: Walking and moving around Mobility: Walking and Moving Around Current Status [id number]): At least 20 percent but less than 40 percent impaired, limited or restricted Mobility: Walking and Moving Around Goal Status [phone]): At least 1 percent but less than 20 percent impaired, limited or restricted    Time: 1229-1254 PT Time Calculation (min) (ACUTE ONLY): 25 min   Charges:   PT Evaluation $PT Eval Moderate Complexity: 1 Procedure PT Treatments $Gait Training: 8-22 mins   PT G Codes:   PT G-Codes **NOT FOR INPATIENT CLASS** Functional Assessment Tool Used: clinical judgment Functional Limitation: Mobility: Walking and moving around Mobility: Walking and Moving Around Current Status [id number]): At least 20 percent but less than 40 percent impaired, limited or restricted Mobility: Walking and Moving Around Goal Status [phone]): At least 1 percent but less than 20 percent impaired, limited or restricted    Cassell Clement, PT, [place] Pager [phone] Office [phone]  10/09/2015, 1:34 PM

## 2015-10-09 NOTE — Progress Notes (Signed)
Patient is on home Spiriva and prn albuterol nebs.  Ruthe Mannan is hospitla formulary substitution for Symbicort.    Bonnita Nasuti Pharm.D. CPP, BCPS Clinical Pharmacist [phone] 10/09/2015 9:57 PM

## 2015-10-09 NOTE — Op Note (Signed)
NAME:  Shane Watson, Shane Watson NO.:  [id number]  MEDICAL RECORD NO.:  [id number]  LOCATION:                                 FACILITY:  PHYSICIAN:  Doran Heater. Veverly Fells, M.D. DATE OF BIRTH:  1945-09-30  DATE OF PROCEDURE:  10/08/2015 DATE OF DISCHARGE:                              OPERATIVE REPORT   PREOPERATIVE DIAGNOSIS:  Displaced and comminuted right proximal humerus fracture.  POSTOPERATIVE DIAGNOSIS:  Displaced and comminuted right proximal humerus fracture.  PROCEDURE PERFORMED:  Reverse total shoulder arthroplasty, right with tuberosity repair using DePuy DELTA XTEND prosthesis.  ATTENDING SURGEON:  Doran Heater. Veverly Fells, MD  ASSISTANT:  Abbott Pao. Dixon, PA-C, who was scrubbed the entire procedure and necessary for satisfactory completion of surgery.  ANESTHESIA:  General anesthesia was used plus interscalene block.  ESTIMATED BLOOD LOSS:  200 mL.  FLUID REPLACEMENT:  1200 mL crystalloid.  INSTRUMENT COUNTS:  Correct.  COMPLICATIONS:  There were no complications.  ANTIBIOTICS:  Perioperative antibiotics were given.  INDICATIONS:  The patient is a 70 year old male with a history of a fall on the right shoulder injuring that shoulder and complaining of severe pain.  The patient presented with a displaced comminuted proximal humerus fracture requiring surgery.  The patient's humeral head was rotated 180 degrees and to point laterally and tuberosities were extensively comminuted.  Given the high likelihood for malunion and poor shoulder function, we recommended surgical management consisting of reverse shoulder arthroplasty with possible tuberosity repair depending on the fragmentation of greater and lesser tuberosity fragments.  Risks and benefits of surgery discussed.  The patient had Cardiology clearance preop and informed consent obtained.  DESCRIPTION OF PROCEDURE:  After an adequate level of anesthesia was achieved, the patient was positioned in modified  beach-chair position. Right shoulder correctly identified, sterilely prepped and draped in usual manner.  Time-out called.  We entered the shoulder using standard deltopectoral approach.  We started the coracoid process extending down to the anterior humerus.  Dissection down through subcutaneous tissues using Bovie, identified the cephalic vein, took it laterally with the deltoid, pectoralis taken medially.  Conjoint tendon identified and retracted medially.  Hematoma was drained, placed our deep retractors. We then went ahead and released the biceps tendon.  I then osteotomized the lesser tuberosity and then debulked that with a big rongeur.  We placed #2 FiberWire suture in a mattress fashion medial to the lesser tuberosity in the subscapularis tendon for repair of the tuberosity at the end.  We then went ahead and removed the humeral head which was pointed laterally and taken to the back table.  We did harvest some bone graft from that.  We debulked the greater tuberosity and tagged that with #2 FiberWire suture, again mattress sutures lateral to the greater tuberosity.  We debulked that tuberosity for repair.  We then identified multiple loose fragments which were removed from the shoulder.  We irrigated thoroughly.  We did preserve the majority of the joint capsule, but prepared the glenoid for metaglene placement.  We 1st removed cartilage with a Cobb elevator.  We found the center point low on the glenoid and drilled a guide pin.  We then reamed for  the metaglene and drilled our central peg hole, impacted the metaglene into position, was in perfect position, low on the glenoid well seated and then placed 4 screws overall locked 48 inferiorly, 36 proximally and then anterior-posterior both 24 locked.  We had excellent base plate fixation.  We then placed a 42 standard glenosphere into position, protecting the axillary nerve.  We screwed that in position, and we were pleased with  its location and coverage.  We next went ahead and prepared our humerus.  We reamed up to a size 12 body and then placed a 12 epi-2 centered which we placed at 0 degrees of eversion.  Once we had that in the humerus, we reduced the shoulder with initially a 42+ 3 and then went to a 42+ 9.  We felt like this was going to work perfectly with good stability.  We then removed the trial components, thoroughly irrigated the humeral shaft.  We placed 3 drill holes in the humeral shaft centered on the biceps groove.  We drilled with small drill bit and passed #2 FiberWire suture to secure the tuberosities to the shaft. Once the sutures were placed, we dried the canal.  We then vacuum mixed DePuy 1 cement and hand packed that into the canal and then inserted a hybrid stem, so this was a [place] distal stem which can be cemented and that was with a HA coated proximal body, the metaphyseal epi-2 centered and that was set on 0.  We impacted that in position with appropriate version at 0, allowed for the cement to harden.  We then went ahead and placed around-the-world suture on the back of the stem and then selected our 42+ 9 poly, reduced the shoulder with a nice pop, had nice tension in extension, flexion, and really minimal gapping with external rotation and negative sulcus, everything moved together as a unit.  We then repaired the tuberosities to each other and to the shaft with a secure #2 FiberWire.  We also extensively bone graft the HA coating proximally with available bone graft from the head and compressed that with his tuberosities, so everything moved together as a unit.  We repaired the rotator interval as well and we also sewed the __________ stitch lateral to greater tuberosity and medial to the lesser tuberosity and tied that over the top compressing everything.  It all moved together as a unit __________ range of motion.  Given the tuberosity was quite good, and I will get much  better power to the shoulder should that tuberosities heel.  The patient now has a stable shoulder situation, will be very careful with load bearing with that shoulder.  The patient was awakened and taken to recovery room in stable condition.     Doran Heater. Veverly Fells, M.D.     SRN/MEDQ  D:  10/08/2015  T:  10/08/2015  Job:  [id number]

## 2015-10-09 NOTE — Progress Notes (Signed)
Orthopedics Progress Note  Subjective: Patient reports doing better with pain but having to take pain meds every few hours. He did well with therapy today  Objective:  Filed Vitals:   10/09/15 0146 10/09/15 1300  BP: 141/59 144/60  Pulse: 93 85  Temp: 99.3 F (37.4 C) 97.7 F (36.5 C)  Resp: 20 20    General: Awake and alert  Musculoskeletal: dressing intact and NVI distally Neurovascularly intact  Lab Results  Component Value Date   WBC 6.6 10/08/2015   HGB 11.9* 10/09/2015   HCT 37.2* 10/09/2015   MCV 86.7 10/08/2015   PLT 134* 10/08/2015       Component Value Date/Time   NA 138 10/09/2015 0747   K 4.6 10/09/2015 0747   CL 100* 10/09/2015 0747   CO2 26 10/09/2015 0747   GLUCOSE 139* 10/09/2015 0747   BUN 14 10/09/2015 0747   CREATININE 1.23 10/09/2015 0747   CREATININE 1.51* 04/28/2013 1601   CALCIUM 8.6* 10/09/2015 0747   GFRNONAA 58* 10/09/2015 0747   GFRAA >60 10/09/2015 0747    Lab Results  Component Value Date   INR 1.23 10/08/2015   INR 1.18 10/02/2015    Assessment/Plan: POD #1 s/p Procedure(s): REVERSE SHOULDER ARTHROPLASTY Appreciate PT, OT help.  Plan to discharge to [facility] when bed available. Continue gentle rehab for that shoulder with no weight bearing.   Doran Heater. Veverly Fells, MD 10/09/2015 5:17 PM

## 2015-10-10 DIAGNOSIS — S42209D Unspecified fracture of upper end of unspecified humerus, subsequent encounter for fracture with routine healing: Secondary | ICD-10-CM | POA: Diagnosis not present

## 2015-10-10 DIAGNOSIS — E119 Type 2 diabetes mellitus without complications: Secondary | ICD-10-CM | POA: Diagnosis not present

## 2015-10-10 DIAGNOSIS — E785 Hyperlipidemia, unspecified: Secondary | ICD-10-CM | POA: Diagnosis not present

## 2015-10-10 DIAGNOSIS — R600 Localized edema: Secondary | ICD-10-CM | POA: Diagnosis not present

## 2015-10-10 DIAGNOSIS — I2510 Atherosclerotic heart disease of native coronary artery without angina pectoris: Secondary | ICD-10-CM | POA: Diagnosis not present

## 2015-10-10 DIAGNOSIS — N189 Chronic kidney disease, unspecified: Secondary | ICD-10-CM | POA: Diagnosis not present

## 2015-10-10 DIAGNOSIS — Z96619 Presence of unspecified artificial shoulder joint: Secondary | ICD-10-CM | POA: Diagnosis not present

## 2015-10-10 DIAGNOSIS — J9612 Chronic respiratory failure with hypercapnia: Secondary | ICD-10-CM | POA: Diagnosis not present

## 2015-10-10 DIAGNOSIS — R262 Difficulty in walking, not elsewhere classified: Secondary | ICD-10-CM | POA: Diagnosis not present

## 2015-10-10 DIAGNOSIS — J449 Chronic obstructive pulmonary disease, unspecified: Secondary | ICD-10-CM | POA: Diagnosis not present

## 2015-10-10 DIAGNOSIS — R41841 Cognitive communication deficit: Secondary | ICD-10-CM | POA: Diagnosis not present

## 2015-10-10 DIAGNOSIS — I10 Essential (primary) hypertension: Secondary | ICD-10-CM | POA: Diagnosis not present

## 2015-10-10 DIAGNOSIS — S42301A Unspecified fracture of shaft of humerus, right arm, initial encounter for closed fracture: Secondary | ICD-10-CM | POA: Diagnosis not present

## 2015-10-10 DIAGNOSIS — R0600 Dyspnea, unspecified: Secondary | ICD-10-CM | POA: Diagnosis not present

## 2015-10-10 DIAGNOSIS — R278 Other lack of coordination: Secondary | ICD-10-CM | POA: Diagnosis not present

## 2015-10-10 DIAGNOSIS — I119 Hypertensive heart disease without heart failure: Secondary | ICD-10-CM | POA: Diagnosis not present

## 2015-10-10 DIAGNOSIS — M6281 Muscle weakness (generalized): Secondary | ICD-10-CM | POA: Diagnosis not present

## 2015-10-10 DIAGNOSIS — S42309A Unspecified fracture of shaft of humerus, unspecified arm, initial encounter for closed fracture: Secondary | ICD-10-CM | POA: Diagnosis not present

## 2015-10-10 DIAGNOSIS — G4733 Obstructive sleep apnea (adult) (pediatric): Secondary | ICD-10-CM | POA: Diagnosis not present

## 2015-10-10 LAB — GLUCOSE, CAPILLARY: GLUCOSE-CAPILLARY: 148 mg/dL — AB (ref 65–99)

## 2015-10-10 NOTE — Progress Notes (Signed)
Pt ready for d/t to SNF per MD. Pt d/c from telemetry, peripheral IV removed. Pts dressing was changed to 4x4s and hypofix tape per PA order. Belongings gathered and sent with PTAR.   [place], Jerry Caras

## 2015-10-10 NOTE — Discharge Summary (Signed)
Physician Discharge Summary   Patient ID: Shane Watson MRN: [id number] DOB/AGE: 1946/03/11 70 y.o.  Admit date: 10/07/2015 Discharge date: 10/10/2015  Admission Diagnoses:  Principal Problem:   Fracture, humerus, proximal Active Problems:   Coronary artery disease   Hypertensive heart disease   CKD (chronic kidney disease), stage III   Hyperlipidemia   COPD (chronic obstructive pulmonary disease) (HCC)   Morbid obesity ([place])   Lower extremity edema   S/P shoulder replacement   Discharge Diagnoses:  Same   Surgeries: Procedure(s): REVERSE SHOULDER ARTHROPLASTY on 10/07/2015 - 10/08/2015   Consultants: cardiology, PT/OT  Discharged Condition: Stable  Hospital Course: Shane Watson is an 70 y.o. male who was admitted 10/07/2015 with a chief complaint of No chief complaint on file. , and found to have a diagnosis of Fracture, humerus, proximal.  They were brought to the operating room on 10/07/2015 but needed cardiology clearance. He was taken back to surgery on  10/08/2015 and underwent the above named procedures.    The patient had an uncomplicated hospital course and was stable for discharge.  Recent vital signs:  Filed Vitals:   10/09/15 1900 10/10/15 0506  BP: 143/57 105/48  Pulse: 81 79  Temp: 98.4 F (36.9 C) 98.9 F (37.2 C)  Resp: 20 18    Recent laboratory studies:  Results for orders placed or performed during the hospital encounter of 10/07/15  Glucose, capillary  Result Value Ref Range   Glucose-Capillary 121 (H) 65 - 99 mg/dL  Glucose, capillary  Result Value Ref Range   Glucose-Capillary 113 (H) 65 - 99 mg/dL  Protime-INR  Result Value Ref Range   Prothrombin Time 15.7 (H) 11.6 - 15.2 seconds   INR 1.23 0.00 - 99991111  Basic metabolic panel  Result Value Ref Range   Sodium 141 135 - 145 mmol/L   Potassium 4.2 3.5 - 5.1 mmol/L   Chloride 104 101 - 111 mmol/L   CO2 28 22 - 32 mmol/L   Glucose, Bld 131 (H) 65 - 99 mg/dL   BUN 12 6 - 20 mg/dL   Creatinine, Ser 1.22 0.61 - 1.24 mg/dL   Calcium 8.6 (L) 8.9 - 10.3 mg/dL   GFR calc non Af Amer 58 (L) >60 mL/min   GFR calc Af Amer >60 >60 mL/min   Anion gap 9 5 - 15  CBC  Result Value Ref Range   WBC 6.6 4.0 - 10.5 K/uL   RBC 4.22 4.22 - 5.81 MIL/uL   Hemoglobin 11.3 (L) 13.0 - 17.0 g/dL   HCT 36.6 (L) 39.0 - 52.0 %   MCV 86.7 78.0 - 100.0 fL   MCH 26.8 26.0 - 34.0 pg   MCHC 30.9 30.0 - 36.0 g/dL   RDW 14.5 11.5 - 15.5 %   Platelets 134 (L) 150 - 400 K/uL  Glucose, capillary  Result Value Ref Range   Glucose-Capillary 114 (H) 65 - 99 mg/dL  Glucose, capillary  Result Value Ref Range   Glucose-Capillary 110 (H) 65 - 99 mg/dL   Comment 1 Notify RN    Comment 2 Document in Chart   Hemoglobin and hematocrit, blood  Result Value Ref Range   Hemoglobin 11.9 (L) 13.0 - 17.0 g/dL   HCT 37.2 (L) 39.0 - [id number] %  Basic metabolic panel  Result Value Ref Range   Sodium 138 135 - 145 mmol/L   Potassium 4.6 3.5 - 5.1 mmol/L   Chloride 100 (L) 101 - 111 mmol/L   CO2 26  22 - 32 mmol/L   Glucose, Bld 139 (H) 65 - 99 mg/dL   BUN 14 6 - 20 mg/dL   Creatinine, Ser 1.23 0.61 - 1.24 mg/dL   Calcium 8.6 (L) 8.9 - 10.3 mg/dL   GFR calc non Af Amer 58 (L) >60 mL/min   GFR calc Af Amer >60 >60 mL/min   Anion gap 12 5 - 15  Glucose, capillary  Result Value Ref Range   Glucose-Capillary 172 (H) 65 - 99 mg/dL  Glucose, capillary  Result Value Ref Range   Glucose-Capillary 142 (H) 65 - 99 mg/dL  Glucose, capillary  Result Value Ref Range   Glucose-Capillary 148 (H) 65 - 99 mg/dL    Discharge Medications:     Medication List    TAKE these medications        acetaminophen 500 MG tablet  Commonly known as:  TYLENOL  Take 500 mg by mouth at bedtime.     albuterol 108 (90 Base) MCG/ACT inhaler  Commonly known as:  PROVENTIL HFA;VENTOLIN HFA  Inhale 2 puffs into the lungs every 4 (four) hours as needed for wheezing or shortness of breath (((PLAN A))).     albuterol (5 MG/ML) 0.5%  nebulizer solution  Commonly known as:  PROVENTIL  Take 2.5 mg by nebulization every 4 (four) hours as needed for wheezing or shortness of breath (((PLAN B))).     aspirin EC 81 MG tablet  Take 81 mg by mouth daily.     atorvastatin 40 MG tablet  Commonly known as:  LIPITOR  Take 1 tablet by mouth at  bedtime     budesonide-formoterol 160-4.5 MCG/ACT inhaler  Commonly known as:  SYMBICORT  Take 2 puffs first thing in am and then another 2 puffs about 12 hours later.     cetirizine 10 MG tablet  Commonly known as:  ZYRTEC  Take 10 mg by mouth every morning.     colesevelam 625 MG tablet  Commonly known as:  WELCHOL  Take 1 tablet (625 mg total) by mouth daily with breakfast.     diltiazem 60 MG tablet  Commonly known as:  CARDIZEM  Take 1 tablet (60 mg total) by mouth 4 (four) times daily as needed.     doxazosin 8 MG tablet  Commonly known as:  CARDURA  Take 1 tablet by mouth  daily     famotidine 20 MG tablet  Commonly known as:  PEPCID  Take 1 tablet by mouth at  bedtime     FLUTTER Devi  Use as directed     furosemide 20 MG tablet  Commonly known as:  LASIX  Take 2 tablets by mouth  daily     gabapentin 100 MG capsule  Commonly known as:  NEURONTIN  Take 300 mg by mouth 2 (two) times daily. 3 capsules by mouth twice daily     losartan 50 MG tablet  Commonly known as:  COZAAR  Take 1 tablet by mouth  daily     meclizine 12.5 MG tablet  Commonly known as:  ANTIVERT  Take 25 mg by mouth every morning. Reported on 08/27/2015     methocarbamol 500 MG tablet  Commonly known as:  ROBAXIN  Take 1 tablet (500 mg total) by mouth 3 (three) times daily as needed.     MUCINEX DM MAXIMUM STRENGTH 60-1200 MG Tb12  Take 1,200 mg by mouth 2 (two) times daily as needed (for congestion).     multivitamin with minerals Tabs  tablet  Take 1 tablet by mouth at bedtime.     nitroGLYCERIN 0.4 MG SL tablet  Commonly known as:  NITROSTAT  Place 1 tablet (0.4 mg total) under  the tongue every 5 (five) minutes as needed for chest pain.     oxybutynin 5 MG tablet  Commonly known as:  DITROPAN  Take 5 mg by mouth daily as needed for bladder spasms.     oxyCODONE-acetaminophen 5-325 MG tablet  Commonly known as:  PERCOCET/ROXICET  Take 2 tablets by mouth every 8 (eight) hours as needed for severe pain.     oxyCODONE-acetaminophen 5-325 MG tablet  Commonly known as:  ROXICET  Take 1-2 tablets by mouth every 4 (four) hours as needed for severe pain.     OXYGEN  Inhale 2 L into the lungs at bedtime.     pantoprazole 40 MG tablet  Commonly known as:  PROTONIX  Take 1 tablet by mouth  daily     potassium chloride SA 20 MEQ tablet  Commonly known as:  K-DUR,KLOR-CON  Take 1 tablet by mouth  daily     SPIRIVA RESPIMAT 2.5 MCG/ACT Aers  Generic drug:  Tiotropium Bromide Monohydrate  INHALE 2 PUFFS INTO THE LUNGS EVERY MORNING     TRADJENTA 5 MG Tabs tablet  Generic drug:  linagliptin  Take 5 mg by mouth daily.        Diagnostic Studies: Dg Pelvis 1-2 Views  10/02/2015  CLINICAL DATA:  Status post fall.  Right hip pain. EXAM: PELVIS - 1-2 VIEW COMPARISON:  None. FINDINGS: Cortical irregularity of the distal right femoral neck concerning for nondisplaced fracture. No pelvic bone lesions are seen. IMPRESSION: Cortical irregularity of the distal right femoral neck concerning for nondisplaced fracture. Recommend dedicated 2 view xray of the right hip. Electronically Signed   By: Kathreen Devoid   On: 10/02/2015 17:18   Dg Shoulder Right  10/02/2015  CLINICAL DATA:  Fall today with severe right shoulder pain. EXAM: RIGHT SHOULDER - 2+ VIEW COMPARISON:  None. FINDINGS: AP and scapular Y-views of the right shoulder are provided. There is a markedly displaced/comminuted fracture centered within the right humeral neck, with probable involvement of the humeral head. There is at least mild impaction at the fracture site and there are large avulsion fracture fragments located  superior to the humeral head. It is not possible to exclude additional fracture within the adjacent glenoid. No fracture seen within the body of the scapula. Overlying acromioclavicular joint space remains normally aligned. IMPRESSION: Displaced/comminuted fractures centered within the right humeral neck with probable involvement of the humeral head as well. At least some degree of impaction at the main fracture site and large avulsion fracture fragments located superior to the humeral head. Cannot exclude additional fracture within the adjacent glenoid. Main component of the humeral head remains grossly well positioned relative to the glenoid fossa. Electronically Signed   By: Franki Cabot M.D.   On: 10/02/2015 17:20   Ct Head Wo Contrast  10/02/2015  CLINICAL DATA:  Patient fell and has multiple head and facial lacerations with neck pain radiating into right shoulder. EXAM: CT HEAD WITHOUT CONTRAST CT MAXILLOFACIAL WITHOUT CONTRAST CT CERVICAL SPINE WITHOUT CONTRAST TECHNIQUE: Multidetector CT imaging of the head, cervical spine, and maxillofacial structures were performed using the standard protocol without intravenous contrast. Multiplanar CT image reconstructions of the cervical spine and maxillofacial structures were also generated. COMPARISON:  Head and cervical spine CT from 08/01/2009. Chest CT from 11/21/2014 and 10/20/2013.  FINDINGS: CT HEAD FINDINGS There is no evidence for acute hemorrhage, hydrocephalus, mass lesion, or abnormal extra-axial fluid collection. No definite CT evidence for acute infarction. Diffuse loss of parenchymal volume is consistent with atrophy. Patchy low attenuation in the deep hemispheric and periventricular white matter is nonspecific, but likely reflects chronic microvascular ischemic demyelination. The visualized paranasal sinuses and mastoid air cells are clear. No evidence for skull fracture CT MAXILLOFACIAL FINDINGS Mandible is intact. The temporomandibular joints are  located. No evidence for zygomatic arch fracture. No maxillary sinus fracture. No medial or inferior orbital wall blowout fracture. Pterygoid plates are intact. No evidence for orbital rim fracture. The globes are symmetric in size and shape. Intra orbital fat is preserved bilaterally. CT CERVICAL SPINE FINDINGS Imaging was obtained from the skullbase through the T2 vertebral body. No evidence of fracture. No subluxation. There is diffuse loss of intervertebral disc height throughout the cervical spine, from the C2-3 level down to C7-T1 with endplate spurring seen at nearly all levels. The facets are well aligned bilaterally. Mild straightening of the normal cervical lordosis is evident. There is no prevertebral soft tissue swelling. 5 mm posterior left upper lobe pulmonary nodule along the major fissure is unchanged since prior chest CT. IMPRESSION: 1. No acute intracranial abnormality. 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 2. No skull fracture. 3. No evidence for fracture involving the bony anatomy of the face. 4. 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 5. 5 mm posterior left upper lobe pulmonary nodule, stable since CT scan of 10/20/2013, consistent with benign process. Electronically Signed   By: Misty Stanley M.D.   On: 10/02/2015 17:14   Ct Cervical Spine Wo Contrast  10/02/2015  CLINICAL DATA:  Patient fell and has multiple head and facial lacerations with neck pain radiating into right shoulder. EXAM: CT HEAD WITHOUT CONTRAST CT MAXILLOFACIAL WITHOUT CONTRAST CT CERVICAL SPINE WITHOUT CONTRAST TECHNIQUE: Multidetector CT imaging of the head, cervical spine, and maxillofacial structures were performed using the standard protocol without intravenous contrast. Multiplanar CT image reconstructions of the cervical spine and maxillofacial structures were also generated. COMPARISON:  Head and cervical spine CT from 08/01/2009.  Chest CT from 11/21/2014 and 10/20/2013. FINDINGS: CT HEAD FINDINGS There is no evidence for acute hemorrhage, hydrocephalus, mass lesion, or abnormal extra-axial fluid collection. No definite CT evidence for acute infarction. Diffuse loss of parenchymal volume is consistent with atrophy. Patchy low attenuation in the deep hemispheric and periventricular white matter is nonspecific, but likely reflects chronic microvascular ischemic demyelination. The visualized paranasal sinuses and mastoid air cells are clear. No evidence for skull fracture CT MAXILLOFACIAL FINDINGS Mandible is intact. The temporomandibular joints are located. No evidence for zygomatic arch fracture. No maxillary sinus fracture. No medial or inferior orbital wall blowout fracture. Pterygoid plates are intact. No evidence for orbital rim fracture. The globes are symmetric in size and shape. Intra orbital fat is preserved bilaterally. CT CERVICAL SPINE FINDINGS Imaging was obtained from the skullbase through the T2 vertebral body. No evidence of fracture. No subluxation. There is diffuse loss of intervertebral disc height throughout the cervical spine, from the C2-3 level down to C7-T1 with endplate spurring seen at nearly all levels. The facets are well aligned bilaterally. Mild straightening of the normal cervical lordosis is evident. There is no prevertebral soft tissue swelling. 5 mm posterior left upper lobe pulmonary nodule along the major fissure is unchanged since prior chest CT. IMPRESSION: 1. No acute intracranial abnormality.  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 2. No skull fracture. 3. No evidence for fracture involving the bony anatomy of the face. 4. 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 5. 5 mm posterior left upper lobe pulmonary nodule, stable since CT scan of 10/20/2013, consistent with benign process. Electronically Signed   By: Misty Stanley M.D.    On: 10/02/2015 17:14   Ct Shoulder Right Wo Contrast  10/02/2015  CLINICAL DATA:  Status post fall onto top of right shoulder. Right humeral neck fracture. Further evaluation requested. Initial encounter. EXAM: CT OF THE RIGHT SHOULDER WITHOUT CONTRAST TECHNIQUE: Multidetector CT imaging was performed according to the standard protocol. Multiplanar CT image reconstructions were also generated. COMPARISON:  Right shoulder radiographs performed earlier today at 4:48 p.m. FINDINGS: There is a significantly comminuted fracture of the right humeral head and neck, concerning for a Neer four-part fracture. There is inferior subluxation of the largest superior humeral head fragment, with the medial edge abutting the glenoid fossa, and multiple large displaced fragments anteriorly, superiorly and inferiorly. The articular surface with the glenoid fossa is not definitely seen, and its fragment may be displaced either superiorly or inferiorly. There is mild impaction at the humeral neck. A hematoma is noted at the right glenohumeral joint space. Degenerative change is noted at the right acromioclavicular joint, with inferior osteophyte formation. No additional fractures are identified. Mild soft tissue injury is noted at the right axilla. The visualized portions of the right lung are grossly clear. The visualized portions of the mediastinum are grossly unremarkable. The vasculature is not well assessed without contrast. There is focal kinking of the right axillary vein raises question for some degree of disruption, though this may reflect motion artifact. Would correlate for any associated symptoms. IMPRESSION: 1. Significantly comminuted fracture of the right humeral head and neck, concerning for a four-part fracture. Inferior subluxation of the largest superior humeral head fragment, with the medial edge abutting the glenoid fossa, and multiple large displaced fragments anteriorly, superiorly and inferiorly. The  articular surface of the the glenoid fossa is not definitely seen. This fragment may be displaced either superiorly or inferiorly. 2. Mild impaction noted at the humeral neck. 3. Hematoma at the right glenohumeral joint space. 4. Mild soft tissue injury at the right axilla. Focal kinking of the right axillary vein raises question for some degree of disruption, though this may simply reflect motion artifact. Would correlate for any associated symptoms. Electronically Signed   By: Garald Balding M.D.   On: 10/02/2015 20:36   Dg Shoulder Right Port  10/08/2015  CLINICAL DATA:  Postop right shoulder arthroplasty. EXAM: PORTABLE RIGHT SHOULDER - 2+ VIEW COMPARISON:  10/02/2015 FINDINGS: Postoperative change compatible recent right shoulder arthroplasty as prosthetic components appear to be adequately located. Evidence of several bony fragments compatible with recent humeral head/ neck fracture. IMPRESSION: Expected postoperative change compatible with recent right shoulder arthroplasty. Electronically Signed   By: Marin Olp M.D.   On: 10/08/2015 17:25   Dg Hip Unilat With Pelvis 2-3 Views Right  10/02/2015  CLINICAL DATA:  70 year old male with history of trauma from a fall today while stepping up on top of of a curb. Right-sided hand pain. EXAM: DG HIP (WITH OR WITHOUT PELVIS) 2-3V RIGHT COMPARISON:  AP pelvis from earlier today. FINDINGS: There is no evidence of hip fracture or dislocation. There is no evidence of arthropathy or other focal bone abnormality. IMPRESSION: 1. No evidence of acute femoral  neck fracture. The finding on the examination from earlier today appeared to be related to mach effect (artifact). Electronically Signed   By: Vinnie Langton M.D.   On: 10/02/2015 18:15   Ct Maxillofacial Wo Cm  10/02/2015  CLINICAL DATA:  Patient fell and has multiple head and facial lacerations with neck pain radiating into right shoulder. EXAM: CT HEAD WITHOUT CONTRAST CT MAXILLOFACIAL WITHOUT CONTRAST  CT CERVICAL SPINE WITHOUT CONTRAST TECHNIQUE: Multidetector CT imaging of the head, cervical spine, and maxillofacial structures were performed using the standard protocol without intravenous contrast. Multiplanar CT image reconstructions of the cervical spine and maxillofacial structures were also generated. COMPARISON:  Head and cervical spine CT from 08/01/2009. Chest CT from 11/21/2014 and 10/20/2013. FINDINGS: CT HEAD FINDINGS There is no evidence for acute hemorrhage, hydrocephalus, mass lesion, or abnormal extra-axial fluid collection. No definite CT evidence for acute infarction. Diffuse loss of parenchymal volume is consistent with atrophy. Patchy low attenuation in the deep hemispheric and periventricular white matter is nonspecific, but likely reflects chronic microvascular ischemic demyelination. The visualized paranasal sinuses and mastoid air cells are clear. No evidence for skull fracture CT MAXILLOFACIAL FINDINGS Mandible is intact. The temporomandibular joints are located. No evidence for zygomatic arch fracture. No maxillary sinus fracture. No medial or inferior orbital wall blowout fracture. Pterygoid plates are intact. No evidence for orbital rim fracture. The globes are symmetric in size and shape. Intra orbital fat is preserved bilaterally. CT CERVICAL SPINE FINDINGS Imaging was obtained from the skullbase through the T2 vertebral body. No evidence of fracture. No subluxation. There is diffuse loss of intervertebral disc height throughout the cervical spine, from the C2-3 level down to C7-T1 with endplate spurring seen at nearly all levels. The facets are well aligned bilaterally. Mild straightening of the normal cervical lordosis is evident. There is no prevertebral soft tissue swelling. 5 mm posterior left upper lobe pulmonary nodule along the major fissure is unchanged since prior chest CT. IMPRESSION: 1. No acute intracranial abnormality. Atrophy with chronic small vessel white matter  ischemic demyelination. 2. No skull fracture. 3. No evidence for fracture involving the bony anatomy of the face. 4. 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 5. 5 mm posterior left upper lobe pulmonary nodule, stable since CT scan of 10/20/2013, consistent with benign process. Electronically Signed   By: Misty Stanley M.D.   On: 10/02/2015 17:14    Disposition: 01-Home or Self Care        Follow-up Information    Follow up with NORRIS,STEVEN R, MD. Call in 2 weeks.   Specialty:  Orthopedic Surgery   Why:  [phone]   Contact information:   [address] [place] [address] [phone]        Signed: Ventura Bruns 10/10/2015, 8:00 AM

## 2015-10-10 NOTE — Clinical Social Work Note (Signed)
Patient to be discharged to [facility]. Appropriate discharge documentation provided to [facility]. Patient to be admitted to room 601. Patient's RN updated and provided with report information. Patient to be discharged via [facility]. [facility] has been arranged by CSW.  No other needs addressed.  Lubertha Sayres, [facility]: [phone] Surgical: [phone]

## 2015-10-10 NOTE — Clinical Social Work Note (Signed)
Clinical Social Work Assessment  Patient Details  Name: Shane Watson MRN: [id number] Date of Birth: [birth year]  Date of referral:  10/10/15               Reason for consult:  Discharge Planning                Permission sought to share information with:  Chartered certified accountant granted to share information::  Yes, Verbal Permission Granted  Name::        Agency::  [facility] (preference [facility])  Relationship::     Contact Information:     Housing/Transportation Living arrangements for the past 2 months:  [place] of Information:  Patient Patient Interpreter Needed:  None Criminal Activity/Legal Involvement Pertinent to Current Situation/Hospitalization:  No - Comment as needed Significant Relationships:  Friend (Patient states patient has multiple friends.) Lives with:  Self Do you feel safe going back to the place where you live?  Yes Need for family participation in patient care:  No (Coment) (Patient able to make own decisions.)  Care giving concerns:  Patient expressed no concerns at this time.   Social Worker assessment / plan:  CSW received referral regarding SNF placement at time of discharge. CSW met with patient to discuss discharge planning needs. Per patient, patient agreeable to PT recommendation for SNF placement at time of discharge and would prefer placement at [facility] [place]. Patient states patient lives alone, but spends his days visiting with friends. CSW to continue to follow and assist with discharge planning needs.  Employment status:  Retired Forensic scientist:  Medicare PT Recommendations:  [place] / Referral to community resources:  Cut Off  Patient/Family's Response to care:  Patient understanding and agreeable to CSW plan of care.  Patient/Family's Understanding of and Emotional Response to Diagnosis, Current Treatment, and Prognosis:   Patient understanding and agreeable to CSW plan of care.  Emotional Assessment Appearance:  Appears stated age Attitude/Demeanor/Rapport:  Other (Appropriate) Affect (typically observed):  Accepting, Appropriate, Pleasant Orientation:  Oriented to Self, Oriented to Place, Oriented to  Time, Oriented to Situation Alcohol / Substance use:  Not Applicable Psych involvement (Current and /or in the community):  No (Comment) (Not appropriate on this admission.)  Discharge Needs  Concerns to be addressed:  No discharge needs identified Readmission within the last 30 days:  No Current discharge risk:  None Barriers to Discharge:  No Barriers Identified   Caroline Sauger, LCSW 10/10/2015, 9:50 AM [phone]

## 2015-10-10 NOTE — Progress Notes (Signed)
Occupational Therapy Treatment Patient Details Name: Shane Watson MRN: [id number] DOB: Jun 28, 1946 Today's Date: 10/10/2015    History of present illness The patient is a 70 y.o. male now s/p Rt shoulder reverse total shoulder arthroplasty due to a fall and proximal humerus fracture also admitted for SOB. PMH: anemia, neuropathy, COPD, vertigo, CAD, Diabetes, Chronic kidney disease, sleep apnea, cardiac catherterization.   OT comments  Pt progressing slowly towards occupational therapy goals. Pt completed AROM exercises for R hand/wrist/elbow with supervision. Reviewed shoulder precautions, sling wear protocol, how to properly don/doff sling, compensatory strategies for bathing/dressing, and HEP. Continue to recommend SNF for post-acute rehab stay. Will continue to follow acutely.   Follow Up Recommendations  SNF    Equipment Recommendations  Other (comment) (TBD in next venue)    Recommendations for Other Services      Precautions / Restrictions Precautions Precautions: Shoulder Type of Shoulder Precautions: Conservative protocol Shoulder Interventions: Shoulder sling/immobilizer;Off for dressing/bathing/exercises Precaution Booklet Issued: Yes (comment) Precaution Comments: No AROM of shoulder; AROM at hand/wrist/elbow allowed Required Braces or Orthoses: Sling Restrictions Weight Bearing Restrictions: Yes RUE Weight Bearing: Non weight bearing       Mobility Bed Mobility               General bed mobility comments: Pt up in chair on OT arrival  Transfers                 General transfer comment: Transfers not attempted this session.    Balance Overall balance assessment: Needs assistance Sitting-balance support: No upper extremity supported;Feet supported Sitting balance-Leahy Scale: Good Sitting balance - Comments: Difficulty weight shifitng due to pain and body habitus                           ADL Overall ADL's : Needs  assistance/impaired                 Upper Body Dressing : Moderate assistance;Sitting Upper Body Dressing Details (indicate cue type and reason): to don/doff sling                   General ADL Comments: Reviewed shoulder precautions, proper positioning of pillows for sleep in bed/recliner, compensatory strategies for dressing/bathing, sling wear protocol, how to properly don/doff sling, and use of ice for pain/edema management. Practiced AROM exercises for RUE.      Vision                     Perception     Praxis      Cognition   Behavior During Therapy: [facility] for tasks assessed/performed Overall Cognitive Status: Within Functional Limits for tasks assessed                       Extremity/Trunk Assessment               Exercises Shoulder Exercises Elbow Flexion: AROM;Right;10 reps;Supine;Seated Elbow Extension: AROM;Right;10 reps;Supine;Seated Wrist Flexion: AROM;Right;10 reps;Supine;Seated Wrist Extension: AROM;Right;10 reps;Supine;Seated Digit Composite Flexion: AROM;Right;10 reps;Supine;Seated Composite Extension: AROM;Right;10 reps;Supine;Seated Donning/doffing shirt without moving shoulder: Moderate assistance Method for sponge bathing under operated UE: Moderate assistance Donning/doffing sling/immobilizer: Moderate assistance Correct positioning of sling/immobilizer: Minimal assistance ROM for elbow, wrist and digits of operated UE: Supervision/safety Sling wearing schedule (on at all times/off for ADL's): Supervision/safety Proper positioning of operated UE when showering: Minimal assistance Positioning of UE while sleeping: Minimal assistance   Shoulder Instructions Shoulder  Instructions Donning/doffing shirt without moving shoulder: Moderate assistance Method for sponge bathing under operated UE: Moderate assistance Donning/doffing sling/immobilizer: Moderate assistance Correct positioning of sling/immobilizer: Minimal  assistance ROM for elbow, wrist and digits of operated UE: Supervision/safety Sling wearing schedule (on at all times/off for ADL's): Supervision/safety Proper positioning of operated UE when showering: Minimal assistance Positioning of UE while sleeping: Minimal assistance     General Comments      Pertinent Vitals/ Pain       Pain Assessment: 0-10 Pain Score: 3  Pain Location: R shoulder Pain Descriptors / Indicators: Aching Pain Intervention(s): Limited activity within patient's tolerance;Monitored during session;Repositioned;Ice applied;Premedicated before session  Home Living                                          Prior Functioning/Environment              Frequency Min 3X/week     Progress Toward Goals  OT Goals(current goals can now be found in the care plan section)  Progress towards OT goals: Progressing toward goals  Acute Rehab OT Goals Patient Stated Goal: rehab then home OT Goal Formulation: With patient Time For Goal Achievement: 10/16/15 Potential to Achieve Goals: Good ADL Goals Pt Will Perform Upper Body Bathing: with mod assist;standing Pt Will Transfer to Toilet: with supervision;ambulating;grab bars Pt Will Perform Toileting - Clothing Manipulation and hygiene: with mod assist;sit to/from stand Pt/caregiver will Perform Home Exercise Program: Increased ROM;Left upper extremity;Independently  Plan Discharge plan remains appropriate    Co-evaluation                 End of Session Equipment Utilized During Treatment: Other (comment);Oxygen (sling)   Activity Tolerance Patient tolerated treatment well   Patient Left in chair;with call bell/phone within reach   Nurse Communication Mobility status        Time: [id number] OT Time Calculation (min): 19 min  Charges: OT General Charges $OT Visit: 1 Procedure OT Treatments $Self Care/Home Management : 8-22 mins  Redmond Baseman, OTR/L Pager: [phone] 10/10/2015,  10:46 AM

## 2015-10-10 NOTE — Clinical Social Work Placement (Signed)
   CLINICAL SOCIAL WORK PLACEMENT  NOTE  Date:  10/10/2015  Patient Details  Name: Shane Watson MRN: [id number] Date of Birth: 1946-03-28  Clinical Social Work is seeking post-discharge placement for this patient at the [place] level of care (*CSW will initial, date and re-position this form in  chart as items are completed):  Yes   Patient/family provided with [place] Work Department's list of facilities offering this level of care within the geographic area requested by the patient (or if unable, by the patient's family).  Yes   Patient/family informed of their freedom to choose among providers that offer the needed level of care, that participate in Medicare, Medicaid or managed care program needed by the patient, have an available bed and are willing to accept the patient.  Yes   Patient/family informed of [place]'s ownership interest in [facility] and [facility], as well as of the fact that they are under no obligation to receive care at these facilities.  PASRR submitted to EDS on 10/08/15     PASRR number received on 10/08/15     Existing PASRR number confirmed on       FL2 transmitted to all facilities in geographic area requested by pt/family on 10/08/15     FL2 transmitted to all facilities within larger geographic area on       Patient informed that his/her managed care company has contracts with or will negotiate with certain facilities, including the following:        Yes   Patient/family informed of bed offers received.  Patient chooses bed at [place] [place]     Physician recommends and patient chooses bed at      Patient to be transferred to [facility] on 10/10/15.  Patient to be transferred to facility by [facility]     Patient family notified on 10/10/15 of transfer.  Name of family member notified:  Patient alert and oriented.     PHYSICIAN       Additional Comment:     _______________________________________________ Caroline Sauger, LCSW 10/10/2015, 9:52 AM

## 2015-10-10 NOTE — Care Management Important Message (Signed)
Important Message  Patient Details  Name: Shane Watson MRN: [id number] Date of Birth: [birth year]   Medicare Important Message Given:  Yes    Barb Merino [place] 10/10/2015, 1:35 PM

## 2015-10-14 ENCOUNTER — Other Ambulatory Visit: Payer: Self-pay | Admitting: Family Medicine

## 2015-10-14 DIAGNOSIS — Z136 Encounter for screening for cardiovascular disorders: Secondary | ICD-10-CM

## 2015-10-18 DIAGNOSIS — I10 Essential (primary) hypertension: Secondary | ICD-10-CM | POA: Diagnosis not present

## 2015-10-18 DIAGNOSIS — R600 Localized edema: Secondary | ICD-10-CM | POA: Diagnosis not present

## 2015-10-18 DIAGNOSIS — S42301A Unspecified fracture of shaft of humerus, right arm, initial encounter for closed fracture: Secondary | ICD-10-CM | POA: Diagnosis not present

## 2015-10-18 DIAGNOSIS — E119 Type 2 diabetes mellitus without complications: Secondary | ICD-10-CM | POA: Diagnosis not present

## 2015-10-18 DIAGNOSIS — I2510 Atherosclerotic heart disease of native coronary artery without angina pectoris: Secondary | ICD-10-CM | POA: Diagnosis not present

## 2015-10-18 DIAGNOSIS — G4733 Obstructive sleep apnea (adult) (pediatric): Secondary | ICD-10-CM | POA: Diagnosis not present

## 2015-10-18 DIAGNOSIS — J449 Chronic obstructive pulmonary disease, unspecified: Secondary | ICD-10-CM | POA: Diagnosis not present

## 2015-10-22 DIAGNOSIS — S42291D Other displaced fracture of upper end of right humerus, subsequent encounter for fracture with routine healing: Secondary | ICD-10-CM | POA: Diagnosis not present

## 2015-10-22 DIAGNOSIS — Z96611 Presence of right artificial shoulder joint: Secondary | ICD-10-CM | POA: Diagnosis not present

## 2015-10-22 DIAGNOSIS — Z471 Aftercare following joint replacement surgery: Secondary | ICD-10-CM | POA: Diagnosis not present

## 2015-10-24 ENCOUNTER — Other Ambulatory Visit: Payer: Self-pay | Admitting: Cardiology

## 2015-10-24 DIAGNOSIS — E291 Testicular hypofunction: Secondary | ICD-10-CM | POA: Diagnosis not present

## 2015-10-31 ENCOUNTER — Ambulatory Visit
Admission: RE | Admit: 2015-10-31 | Discharge: 2015-10-31 | Disposition: A | Discharge status: Home / Self Care | Payer: Medicare Other | Source: Ambulatory Visit | Attending: Family Medicine | Admitting: Family Medicine

## 2015-10-31 DIAGNOSIS — Z136 Encounter for screening for cardiovascular disorders: Secondary | ICD-10-CM

## 2015-11-06 ENCOUNTER — Encounter: Payer: Self-pay | Admitting: Cardiology

## 2015-11-06 ENCOUNTER — Ambulatory Visit (INDEPENDENT_AMBULATORY_CARE_PROVIDER_SITE_OTHER): Payer: Medicare Other | Admitting: Cardiology

## 2015-11-06 VITALS — BP 130/68 | HR 70 | Ht 71.0 in | Wt 328.0 lb

## 2015-11-06 DIAGNOSIS — G4733 Obstructive sleep apnea (adult) (pediatric): Secondary | ICD-10-CM

## 2015-11-06 DIAGNOSIS — I2510 Atherosclerotic heart disease of native coronary artery without angina pectoris: Secondary | ICD-10-CM | POA: Diagnosis not present

## 2015-11-06 DIAGNOSIS — I2583 Coronary atherosclerosis due to lipid rich plaque: Principal | ICD-10-CM

## 2015-11-06 NOTE — Patient Instructions (Signed)

## 2015-11-06 NOTE — Progress Notes (Signed)
[facility] [address] [place], [place]  [address] Phone: [phone] Fax:  [phone]  Date:  11/06/2015   ID:  Shane Watson, DOB 09/25/1945, MRN [id number]  PCP:  Shirline Frees, MD   History of Present Illness: Shane Watson is a 70 y.o. male with coronary artery disease with percutaneous intervention DES x2 to the mid and proximal LAD 12/18/10 here for followup.  Prior cellulitis. Brief hospital stay, urgent care followup, injection and antibiotic orally. Doing much better.  Saw Dr. [name] for chronic bronchitis. Checked oxygen and prescribed for a while. Now only wears when exerts himself. OSA, CPAP.   Wears compression hose for edema.   Denies syncope, fevers other than associated fevers with cellulitis recently.  Previously, feels some burning in chest in the middle of the night that resolves with water. Takes famotidine   One night  palpitations HR 150 ? 180 bpm sweating, HR on watch. also on pulse oximeter. Lasted approximately 30 minutes. He took nitroglycerin, this subsided.   09/2015-shoulder surgery, right. Did well. No CP, no SOB. Apple watch.   Wt Readings from Last 3 Encounters:  11/06/15 328 lb (148.78 kg)  10/07/15 320 lb (145.151 kg)  09/11/15 331 lb 11.2 oz (150.458 kg)     Past Medical History  Diagnosis Date  . Allergy   . Anemia   . Neuropathy ([place])   . COPD (chronic obstructive pulmonary disease) ([place])     a. Uses Home O2 w/ exertion.  . Hyperthyroidism   . Hyperlipidemia   . Vertigo   . Coronary artery disease     a. 11/2010 Cath: LM nl, LAD 43m (3.5x22 and 3.5x12 Resolute DES'), LCX nl, OM1/2/3 nl, RCA 20-30p, PDA nl.   . Heart murmur   . Diabetes mellitus without complication ([place])   . Complication of anesthesia   . PONV (postoperative nausea and vomiting)   . Hypertensive heart disease   . History of kidney stones   . GERD (gastroesophageal reflux disease)   . CKD (chronic kidney disease), stage III   . BPH (benign  prostatic hyperplasia)   . Arthritis   . Neuropathy ([place])   . OSA (obstructive sleep apnea)     a. Uses CPAP.  Marland Kitchen H/O echocardiogram     a. per notes in 11/2010 - nl EF, basal inf wma.    Past Surgical History  Procedure Laterality Date  . Cholecystectomy    . 2 heart stents  12/18/10  . Right knee surgery Right 2008ish    "scraped it out"  . Colonoscopy    . Cardiac catheterization    . Reverse shoulder arthroplasty Right 10/08/2015  . Reverse shoulder arthroplasty Right 10/08/2015    Procedure: REVERSE SHOULDER ARTHROPLASTY;  Surgeon: Netta Cedars, MD;  Location: [facility];  Service: Orthopedics;  Laterality: Right;    Current Outpatient Prescriptions  Medication Sig Dispense Refill  . acetaminophen (TYLENOL) 500 MG tablet Take 500 mg by mouth at bedtime.    Marland Kitchen albuterol (PROVENTIL HFA;VENTOLIN HFA) 108 (90 BASE) MCG/ACT inhaler Inhale 2 puffs into the lungs every 4 (four) hours as needed for wheezing or shortness of breath (((PLAN A))).     Marland Kitchen albuterol (PROVENTIL) (5 MG/ML) 0.5% nebulizer solution Take 2.5 mg by nebulization every 4 (four) hours as needed for wheezing or shortness of breath (((PLAN B))).     Marland Kitchen aspirin EC 81 MG tablet Take 81 mg by mouth daily.    Marland Kitchen atorvastatin (  LIPITOR) 40 MG tablet Take 1 tablet by mouth at  bedtime 90 tablet 2  . budesonide-formoterol (SYMBICORT) 160-4.5 MCG/ACT inhaler Take 2 puffs first thing in am and then another 2 puffs about 12 hours later. 3 Inhaler 3  . cetirizine (ZYRTEC) 10 MG tablet Take 10 mg by mouth every morning.     . colesevelam (WELCHOL) 625 MG tablet Take 1 tablet (625 mg total) by mouth daily with breakfast. 90 tablet 1  . Dextromethorphan-Guaifenesin (MUCINEX DM MAXIMUM STRENGTH) 60-1200 MG TB12 Take 1,200 mg by mouth 2 (two) times daily as needed (for congestion).     Marland Kitchen diltiazem (CARDIZEM) 60 MG tablet Take 60 mg by mouth 4 (four) times daily as needed (tachycardia).    Marland Kitchen doxazosin (CARDURA) 8 MG tablet Take 8 mg by mouth at  bedtime.    . famotidine (PEPCID) 20 MG tablet Take 1 tablet by mouth at  bedtime 90 tablet 1  . furosemide (LASIX) 20 MG tablet Take 2 tablets by mouth  daily 180 tablet 1  . gabapentin (NEURONTIN) 100 MG capsule Take 300 mg by mouth 2 (two) times daily. 3 capsules by mouth twice daily    . linagliptin (TRADJENTA) 5 MG TABS tablet Take 5 mg by mouth daily.    Marland Kitchen losartan (COZAAR) 50 MG tablet Take 1 tablet by mouth  daily 90 tablet 0  . meclizine (ANTIVERT) 12.5 MG tablet Take 25 mg by mouth every morning. Reported on 08/27/2015    . methocarbamol (ROBAXIN) 500 MG tablet Take 1 tablet (500 mg total) by mouth 3 (three) times daily as needed. 60 tablet 1  . Multiple Vitamin (MULTIVITAMIN WITH MINERALS) TABS tablet Take 1 tablet by mouth at bedtime.     . nitroGLYCERIN (NITROSTAT) 0.4 MG SL tablet Place 1 tablet (0.4 mg total) under the tongue every 5 (five) minutes as needed for chest pain. 25 tablet prn  . oxybutynin (DITROPAN) 5 MG tablet Take 5 mg by mouth daily as needed for bladder spasms.    Marland Kitchen oxyCODONE-acetaminophen (PERCOCET/ROXICET) 5-325 MG tablet Take 2 tablets by mouth every 8 (eight) hours as needed for severe pain. 60 tablet 0  . OXYGEN Inhale 2 L into the lungs at bedtime.    . pantoprazole (PROTONIX) 40 MG tablet Take 1 tablet by mouth  daily 90 tablet 3  . potassium chloride SA (K-DUR,KLOR-CON) 20 MEQ tablet Take 1 tablet by mouth  daily 90 tablet 0  . Respiratory Therapy Supplies (FLUTTER) DEVI Use as directed 1 each 0  . SPIRIVA RESPIMAT 2.5 MCG/ACT AERS INHALE 2 PUFFS INTO THE LUNGS EVERY MORNING 4 g 1   No current facility-administered medications for this visit.   Facility-Administered Medications Ordered in Other Visits  Medication Dose Route Frequency Provider Last Rate Last Dose  . bupivacaine-epinephrine (MARCAINE W/ EPI) 0.5% -1:200000 injection    Anesthesia Intra-op Lorrene Reid, MD   25 mL at 10/07/15 1734    Allergies:    Allergies  Allergen Reactions  . Aspirin  Swelling    Angioedema-must take EC coated form only  . Ibuprofen Swelling    Angioedema   . Penicillins Hives  . Sulfa Antibiotics Hives    Social History:  The patient  reports that he quit smoking about 28 years ago. His smoking use included Cigarettes. He has a 22.5 pack-year smoking history. He has never used smokeless tobacco. He reports that he drinks alcohol. He reports that he does not use illicit drugs.   ROS:  Please  see the history of present illness.   PHYSICAL EXAM: VS:  BP 130/68 mmHg  Pulse 70  Ht 5\' 11"  (1.803 m)  Wt 328 lb (148.78 kg)  BMI 45.77 kg/m2 Well nourished, well developed, in no acute distress HEENT: normal thick neck Neck: no JVD Cardiac:  normal S1, S2; RRR; no murmur Lungs:  clear to auscultation bilaterally, no wheezing, rhonchi or rales Abd: soft, nontender, no hepatomegalyMorbidly obese Ext: 1+ edema, compression hose.  Skin: warm and dryRight arm sling Neuro: no focal abnormalities noted  EKG:   Today 04/08/15-sinus rhythm, 85, no other abnormalities personally viewed-prior 10/11/13-normal sinus rhythm, 66, no other changes.    Labs: LDL 2013 was 54. Repeating with Dr. Kenton Kingfisher  ASSESSMENT AND PLAN:  1. Coronary artery disease-PCI to DES 2 to the LAD 11/2010. No chest pain, no angina. Stable no angina. 2. Hypertension-well controlled today. Continue current medications.  Monitor. 3. Morbid obesity-strongly encourage weight loss.this will help with a multitude of his medical issues.  4. COPD - Dr. [name]. O2. Pulm rehab. 5. OSA - CPAP with O2 6. Hyperlipidemia - Lipitor, LDL goal 70. At goal previously. 7. Chronic Kidney disease stage 3-creatinine 1.5.  8. Palpitations-I prescribed to him diltiazem 60 mg short acting every 6 hours as needed. if this continues to occur, we will place an event monitor. Given his morbid obesity and other conditions, this could be manifestation of atrial flutter or perhaps fibrillation.  9. Diabetes type  2-Tradjenta 10. Right humeral fracture-mechanical fall, shoulder surgery. 11. 6 month followup  Signed, Candee Furbish, MD [facility]  11/06/2015 2:03 PM

## 2015-11-14 DIAGNOSIS — E291 Testicular hypofunction: Secondary | ICD-10-CM | POA: Diagnosis not present

## 2015-11-19 DIAGNOSIS — S42291D Other displaced fracture of upper end of right humerus, subsequent encounter for fracture with routine healing: Secondary | ICD-10-CM | POA: Diagnosis not present

## 2015-11-19 DIAGNOSIS — Z471 Aftercare following joint replacement surgery: Secondary | ICD-10-CM | POA: Diagnosis not present

## 2015-11-19 DIAGNOSIS — Z96611 Presence of right artificial shoulder joint: Secondary | ICD-10-CM | POA: Diagnosis not present

## 2015-11-25 ENCOUNTER — Other Ambulatory Visit: Payer: Self-pay | Admitting: Internal Medicine

## 2015-12-05 DIAGNOSIS — E1165 Type 2 diabetes mellitus with hyperglycemia: Secondary | ICD-10-CM | POA: Diagnosis not present

## 2015-12-05 DIAGNOSIS — E291 Testicular hypofunction: Secondary | ICD-10-CM | POA: Diagnosis not present

## 2015-12-17 DIAGNOSIS — S42291D Other displaced fracture of upper end of right humerus, subsequent encounter for fracture with routine healing: Secondary | ICD-10-CM | POA: Diagnosis not present

## 2015-12-17 DIAGNOSIS — Z471 Aftercare following joint replacement surgery: Secondary | ICD-10-CM | POA: Diagnosis not present

## 2015-12-17 DIAGNOSIS — Z96611 Presence of right artificial shoulder joint: Secondary | ICD-10-CM | POA: Diagnosis not present

## 2015-12-24 DIAGNOSIS — E291 Testicular hypofunction: Secondary | ICD-10-CM | POA: Diagnosis not present

## 2015-12-27 DIAGNOSIS — S42291D Other displaced fracture of upper end of right humerus, subsequent encounter for fracture with routine healing: Secondary | ICD-10-CM | POA: Diagnosis not present

## 2015-12-31 DIAGNOSIS — S42291D Other displaced fracture of upper end of right humerus, subsequent encounter for fracture with routine healing: Secondary | ICD-10-CM | POA: Diagnosis not present

## 2016-01-02 DIAGNOSIS — S42291D Other displaced fracture of upper end of right humerus, subsequent encounter for fracture with routine healing: Secondary | ICD-10-CM | POA: Diagnosis not present

## 2016-01-06 ENCOUNTER — Telehealth: Payer: Self-pay | Admitting: Internal Medicine

## 2016-01-06 NOTE — Telephone Encounter (Signed)
Spoke with pt. He needs patient assistance forms for Spiriva and Symbicort. Pt requests that these be left at the front desk for pick up. Nothing further was needed.

## 2016-01-07 DIAGNOSIS — S42291D Other displaced fracture of upper end of right humerus, subsequent encounter for fracture with routine healing: Secondary | ICD-10-CM | POA: Diagnosis not present

## 2016-01-09 DIAGNOSIS — S42291D Other displaced fracture of upper end of right humerus, subsequent encounter for fracture with routine healing: Secondary | ICD-10-CM | POA: Diagnosis not present

## 2016-01-14 DIAGNOSIS — S42291D Other displaced fracture of upper end of right humerus, subsequent encounter for fracture with routine healing: Secondary | ICD-10-CM | POA: Diagnosis not present

## 2016-01-14 DIAGNOSIS — E291 Testicular hypofunction: Secondary | ICD-10-CM | POA: Diagnosis not present

## 2016-01-16 DIAGNOSIS — S42291D Other displaced fracture of upper end of right humerus, subsequent encounter for fracture with routine healing: Secondary | ICD-10-CM | POA: Diagnosis not present

## 2016-01-23 DIAGNOSIS — S42291D Other displaced fracture of upper end of right humerus, subsequent encounter for fracture with routine healing: Secondary | ICD-10-CM | POA: Diagnosis not present

## 2016-01-27 DIAGNOSIS — S42291D Other displaced fracture of upper end of right humerus, subsequent encounter for fracture with routine healing: Secondary | ICD-10-CM | POA: Diagnosis not present

## 2016-01-28 DIAGNOSIS — S42291D Other displaced fracture of upper end of right humerus, subsequent encounter for fracture with routine healing: Secondary | ICD-10-CM | POA: Diagnosis not present

## 2016-01-28 DIAGNOSIS — Z471 Aftercare following joint replacement surgery: Secondary | ICD-10-CM | POA: Diagnosis not present

## 2016-01-28 DIAGNOSIS — Z96611 Presence of right artificial shoulder joint: Secondary | ICD-10-CM | POA: Diagnosis not present

## 2016-01-29 ENCOUNTER — Telehealth: Payer: Self-pay | Admitting: Internal Medicine

## 2016-01-29 ENCOUNTER — Other Ambulatory Visit: Payer: Self-pay | Admitting: Internal Medicine

## 2016-01-29 MED ORDER — TIOTROPIUM BROMIDE MONOHYDRATE 2.5 MCG/ACT IN AERS: INHALATION_SPRAY | RESPIRATORY_TRACT | Status: DC

## 2016-01-29 MED ORDER — BUDESONIDE-FORMOTEROL FUMARATE 160-4.5 MCG/ACT IN AERO: INHALATION_SPRAY | RESPIRATORY_TRACT | Status: DC

## 2016-01-29 NOTE — Telephone Encounter (Signed)
See phone note dated 01/29/16

## 2016-01-29 NOTE — Telephone Encounter (Signed)
Patient dropped off paperwork for his symbicort and spiriva for doctor to fill out. Says he was told to bring them in when he picked up his samples. Papers are in the doctors folder.

## 2016-01-29 NOTE — Telephone Encounter (Signed)
Pt assistance forms for spiriva and symbicort completed and faxed  I have spoke with the pt and notified him that this was done  Forms in MW's scan folder

## 2016-01-29 NOTE — Telephone Encounter (Signed)
Called spoke with pt. He is requesting samples of spiriva and symbicort. I have placed these for pick up. Nothing further needed

## 2016-01-29 NOTE — Telephone Encounter (Signed)
Formed were dropped off and placed in MW look at.

## 2016-01-29 NOTE — Addendum Note (Signed)
Addended by: Inge Rise on: 01/29/2016 11:36 AM   Modules accepted: Orders

## 2016-02-03 ENCOUNTER — Other Ambulatory Visit: Payer: Self-pay | Admitting: Internal Medicine

## 2016-02-04 DIAGNOSIS — E291 Testicular hypofunction: Secondary | ICD-10-CM | POA: Diagnosis not present

## 2016-02-24 ENCOUNTER — Encounter: Payer: Self-pay | Admitting: Internal Medicine

## 2016-02-24 ENCOUNTER — Ambulatory Visit (INDEPENDENT_AMBULATORY_CARE_PROVIDER_SITE_OTHER): Payer: Medicare Other | Admitting: Internal Medicine

## 2016-02-24 VITALS — BP 122/84 | HR 82 | Ht 71.0 in | Wt 315.0 lb

## 2016-02-24 DIAGNOSIS — I2510 Atherosclerotic heart disease of native coronary artery without angina pectoris: Secondary | ICD-10-CM

## 2016-02-24 DIAGNOSIS — J449 Chronic obstructive pulmonary disease, unspecified: Secondary | ICD-10-CM

## 2016-02-24 DIAGNOSIS — J9612 Chronic respiratory failure with hypercapnia: Secondary | ICD-10-CM

## 2016-02-24 MED ORDER — BUDESONIDE-FORMOTEROL FUMARATE 160-4.5 MCG/ACT IN AERO: 2.0000 | INHALATION_SPRAY | Freq: Two times a day (BID) | RESPIRATORY_TRACT | 0 refills | Status: DC

## 2016-02-24 MED ORDER — TIOTROPIUM BROMIDE MONOHYDRATE 2.5 MCG/ACT IN AERS: 2.0000 | INHALATION_SPRAY | Freq: Every day | RESPIRATORY_TRACT | 0 refills | Status: DC

## 2016-02-24 NOTE — Progress Notes (Signed)
Subjective:   Patient ID: Shane Watson, male    DOB: 06/11/1946   MRN: [id number]    Brief patient profile:  53  yowm  Quit smoking 1987 at wt = 240   Referred 05/02/2013 by [name] for low 02. Sees [name] for primary and on cpap per his office with chronic R HD elevation documented first in 2005 c/w anterior eventration with GOLD II criteria copd 05/2013     History of Present Illness  05/02/2013 1st [facility] office visit/ [name] cc indolent onset progressive doe x sev years with w/u by [name] with stent seemed to help at that point and no change uses HC sticker to go shopping and leans on cart at HT due to back but  does do some hunting walking x sev hundred feet.  Uses alb seems to help some with assoc subj wheeze rec Breo one puff daily automatically to see if breathing improves Please see patient coordinator before you leave today  to schedule overnight 02 sat on cpap> rx 2lpm      02/04/2015 f/u ov/[name] re: Morbid obesity/ GOLD II on sym/spirva respimat no longer using med calendar/ action plan Chief Complaint  Patient presents with  . Follow-up    Pt c/o increased SOB and cough for the past wk "getting better" with taking zpack and pred.     Baseline sob did not change until 10 days  prior to OV  With cough / congestion with dark mucus > rx mucinex  Seen 02/02/15 rec change mucinex dm to mucinex and zpak prednisone 20 x 5 days  Did not use hfa before moving to neb saba as per prev action plan  rec Work on perfecting  inhaler technique      05/17/2015 acute extended ov/[name] re: aecopd /GOLD II/ MO Chief Complaint  Patient presents with  . Acute Visit    Pt c/o increased cough for the past wk- prod with grey in the am and light yellow throughout the day. Cough seems worse at night. He has also been wheezing and feeling more SOB.   At acute onset of cough x one week prior to OV  started taking mucinex and deslym, not mucinex dm but otherwise following  action plan on med calendar  cough prod thick yellow mucus  Comfortable at rest and has not needed neb saba yet rec Prednisone 10 mg take  4 each am x 2 days,   2 each am x 2 days,  1 each am x 2 days and stop  Doxycycline 100 mg Take 30  min before your first and last meals of the day with glass of water  See calendar for specific medication instructions and bring it back for each and every office visit   08/27/2015  f/u ov/[name] re: COPD GOLD II plus MO / maint on symb 160/spiriva  Chief Complaint  Patient presents with  . Follow-up    Pt states his breathing is doing well.  He rarely uses albuterol inhaler or neb.    finally turning the corner with wt gain  rec No change in medications Take a food diary with you when you see nutritionist    02/24/2016  f/u ov/[name] re: Copd GOLD II/MO maint symb 160 /spiriva sleeping on 2lpm and cpap  Not using saba at all - really Not limited by breathing from desired activities        No obvious day to day or daytime variabilty or assoc excess/ purulent sputum  or mucus plugs   cp or chest tightness, subjective wheeze overt sinus or hb symptoms. No unusual exp hx or h/o childhood pna/ asthma or knowledge of premature birth.  Sleeping ok without nocturnal  or early am exacerbation  of respiratory  c/o's or need for noct saba. Also denies any obvious fluctuation of symptoms with weather or environmental changes or other aggravating or alleviating factors except as outlined above   Current Medications, Allergies, Complete Past Medical History, Past Surgical History, Family History, and Social History were reviewed in [employer] record.  ROS  The following are not active complaints unless bolded sore throat, dysphagia, dental problems, itching, sneezing,  nasal congestion or excess/ purulent secretions, ear ache,   fever, chills, sweats, unintended wt loss, pleuritic or exertional cp, hemoptysis,  orthopnea pnd or leg swelling sym  bilaterally wearing elastic hose , presyncope, palpitations, heartburn, abdominal pain, anorexia, nausea, vomiting, diarrhea  or change in bowel or urinary habits, change in stools or urine, dysuria,hematuria,  rash, arthralgias, visual complaints, headache, numbness weakness or ataxia or problems with walking or coordination,  change in mood/affect or memory.           Objective:   Physical Exam  08/10/2013      352 > 350 08/25/2013 > 10/17/2013 351 > 01/15/2014  345 > 05/08/2014 345 > 08/06/2014  354 > 02/04/2015 354 > 02/25/2015  354 > 05/17/2015 351 > 05/27/2015  349 > 08/27/2015  335 > 02/24/2016   315    amb massively obese wm/ vital signs reviewed    HEENT: nl dentition, turbinates, and orophanx. Nl external ear canals without cough reflex   NECK :  without JVD/Nodes/TM/ nl carotid upstrokes bilaterally   LUNGS: no acc muscle use, clear to A and P bilaterally though bs are distant   CV:  RRR  no s3 or murmur or increase in P2, wearing TEDS with bilateral  Still 2+ pitting edema with chronic venous stasis changes   ABD:  Massively obese soft and nontender with nl excursion in the supine position. No bruits or organomegaly, bowel sounds nl  MS:  warm without deformities, calf tenderness, cyanosis or clubbing  SKIN: warm and dry without lesions         Assessment & Plan:

## 2016-02-24 NOTE — Assessment & Plan Note (Signed)
-   started breo 05/02/13 > d/c 06/06/13 and replaced with symbicort   - PFT's 06/06/13  FEV1 1.59 (47%) ratio 62 and 13% better despite  breo am ov test, DLC0 82 corrects to 113% - med calendar    08/25/2013  - 02/04/2015 p extensive coaching HFA effectiveness =  75% - flutter valve added 02/04/15  - 02/25/2015  Walked RA x 3 laps @ 185 ft each stopped due to End of study, nl pace, no desat  / min sob/ some hip and leg pain  - referred to rehab 02/25/2015   - re-inforced flutter valve 05/27/2015    - The proper method of use, as well as anticipated side effects, of a metered-dose inhaler are discussed and demonstrated to the patient.    Doing very well albeit on max rx for GOLD II/ group B symptoms   I had an extended discussion with the patient reviewing all relevant studies completed to date and  lasting 15 to 20 minutes of a 25 minute visit    Each maintenance medication was reviewed in detail including most importantly the difference between maintenance and prns and under what circumstances the prns are to be triggered using an action plan format that is not reflected in the computer generated alphabetically organized AVS.    Please see instructions for details which were reviewed in writing and the patient given a copy highlighting the part that I personally wrote and discussed at today's ov.

## 2016-02-24 NOTE — Assessment & Plan Note (Signed)
-   HCO3 33 02/04/15  - 02/25/2015 no desat's walking  - 08/27/2015  Walked RA x 3 laps @ 185 ft each stopped due to  End of study, nl pace, no sob or desat     rec as of 02/24/2016 02 2lpm with cpap only at bedtime

## 2016-02-24 NOTE — Assessment & Plan Note (Addendum)
Body mass index is 43.93 kg/m.  Lab Results  Component Value Date   TSH 0.82 02/04/2015     Contributing to gerd tendency/ doe/reviewed the need and the process to achieve and maintain neg calorie balance >  defer f/u primary care including intermittently monitoring thyroid status

## 2016-02-24 NOTE — Patient Instructions (Addendum)
No change in medications - samples of spiriva respimat  and symbicort  160 if available   Please schedule a follow up visit in 6  months but call sooner if needed

## 2016-02-25 ENCOUNTER — Other Ambulatory Visit: Payer: Self-pay | Admitting: Cardiology

## 2016-02-25 DIAGNOSIS — E291 Testicular hypofunction: Secondary | ICD-10-CM | POA: Diagnosis not present

## 2016-03-24 DIAGNOSIS — E291 Testicular hypofunction: Secondary | ICD-10-CM | POA: Diagnosis not present

## 2016-04-01 ENCOUNTER — Other Ambulatory Visit: Payer: Self-pay | Admitting: Cardiology

## 2016-04-04 ENCOUNTER — Encounter (HOSPITAL_COMMUNITY): Payer: Self-pay | Admitting: Emergency Medicine

## 2016-04-04 ENCOUNTER — Ambulatory Visit (HOSPITAL_COMMUNITY)
Admission: EM | Admit: 2016-04-04 | Discharge: 2016-04-04 | Disposition: A | Discharge status: Home / Self Care | Payer: Medicare Other | Attending: Family Medicine | Admitting: Family Medicine

## 2016-04-04 DIAGNOSIS — J069 Acute upper respiratory infection, unspecified: Secondary | ICD-10-CM

## 2016-04-04 MED ORDER — IPRATROPIUM-ALBUTEROL 0.5-2.5 (3) MG/3ML IN SOLN
RESPIRATORY_TRACT | Status: AC
  Filled 2016-04-04: qty 3

## 2016-04-04 MED ORDER — CIPROFLOXACIN HCL 500 MG PO TABS: 500.0000 mg | ORAL_TABLET | Freq: Two times a day (BID) | ORAL | 0 refills | Status: DC

## 2016-04-04 MED ORDER — IPRATROPIUM-ALBUTEROL 0.5-2.5 (3) MG/3ML IN SOLN
3.0000 mL | Freq: Once | RESPIRATORY_TRACT | Status: AC
  Administered 2016-04-04: 3 mL via RESPIRATORY_TRACT

## 2016-04-04 NOTE — ED Provider Notes (Signed)
CSN: [id number]     Arrival date & time 04/04/16  1633 History   First MD Initiated Contact with Patient 04/04/16 1733     Chief Complaint  Patient presents with  . URI   (Consider location/radiation/quality/duration/timing/severity/associated sxs/prior Treatment) 70 y.o. male presents with what he describes as " a sinus infection"  X 2 days. Patient states that he had a sore throat yesterday and has maxillary pressure, nasal congestion today and wheezing. Patient states that he was instructed to start mucinex DM by his PCP and is now worse. Patient is requesting antibiotic to be started before his COPD is exacerabted and he will have to be hospitalized. Patient denies any fevers.       Past Medical History:  Diagnosis Date  . Allergy   . Anemia   . Arthritis   . BPH (benign prostatic hyperplasia)   . CKD (chronic kidney disease), stage III   . Complication of anesthesia   . COPD (chronic obstructive pulmonary disease) ([place])    a. Uses Home O2 w/ exertion.  . Coronary artery disease    a. 11/2010 Cath: LM nl, LAD 36m (3.5x22 and 3.5x12 Resolute DES'), LCX nl, OM1/2/3 nl, RCA 20-30p, PDA nl.   . Diabetes mellitus without complication ([place])   . GERD (gastroesophageal reflux disease)   . H/O echocardiogram    a. per notes in 11/2010 - nl EF, basal inf wma.  . Heart murmur   . History of kidney stones   . Hyperlipidemia   . Hypertensive heart disease   . Hyperthyroidism   . Neuropathy ([place])   . Neuropathy ([place])   . OSA (obstructive sleep apnea)    a. Uses CPAP.  Marland Kitchen PONV (postoperative nausea and vomiting)   . Vertigo    Past Surgical History:  Procedure Laterality Date  . 2 heart stents  12/18/10  . CARDIAC CATHETERIZATION    . CHOLECYSTECTOMY    . COLONOSCOPY    . REVERSE SHOULDER ARTHROPLASTY Right 10/08/2015  . REVERSE SHOULDER ARTHROPLASTY Right 10/08/2015   Procedure: REVERSE SHOULDER ARTHROPLASTY;  Surgeon: Netta Cedars, MD;  Location: [facility];  Service: Orthopedics;   Laterality: Right;  . right knee surgery Right 2008ish   "scraped it out"   Family History  Problem Relation Age of Onset  . Diabetes Mother   . Heart disease Father   . Diabetes Father   . Heart disease Sister   . Diabetes Sister    Social History  Substance Use Topics  . Smoking status: Former Smoker    Packs/day: 1.50    Years: 15.00    Types: Cigarettes    Quit date: 04/29/1987  . Smokeless tobacco: Never Used  . Alcohol use Yes     Comment: Rare    Review of Systems  Constitutional: Negative.   HENT: Positive for sinus pressure and sore throat.   Respiratory: Positive for wheezing.   Cardiovascular: Negative.     Allergies  Aspirin; Ibuprofen; Penicillins; and Sulfa antibiotics  Home Medications   Prior to Admission medications   Medication Sig Start Date End Date Taking? Authorizing Provider  acetaminophen (TYLENOL) 500 MG tablet Take 500 mg by mouth at bedtime.   Yes Historical Provider, MD  albuterol (PROVENTIL HFA;VENTOLIN HFA) 108 (90 BASE) MCG/ACT inhaler Inhale 2 puffs into the lungs every 4 (four) hours as needed for wheezing or shortness of breath (((PLAN A))).    Yes Historical Provider, MD  albuterol (PROVENTIL) (5 MG/ML) 0.5% nebulizer solution Take 2.5 mg  by nebulization every 4 (four) hours as needed for wheezing or shortness of breath (((PLAN B))).    Yes Historical Provider, MD  aspirin EC 81 MG tablet Take 81 mg by mouth daily.   Yes Historical Provider, MD  atorvastatin (LIPITOR) 40 MG tablet Take 1 tablet by mouth at  bedtime 07/25/15  Yes Jerline Pain, MD  budesonide-formoterol [facility]) 160-4.5 MCG/ACT inhaler Take 2 puffs first thing in am and then another 2 puffs about 12 hours later. 01/29/16  Yes Tanda Rockers, MD  cetirizine (ZYRTEC) 10 MG tablet Take 10 mg by mouth every morning.    Yes Historical Provider, MD  colesevelam (WELCHOL) 625 MG tablet Take 1 tablet (625 mg total) by mouth daily with breakfast. 07/05/14  Yes Jerline Pain, MD   Dextromethorphan-Guaifenesin (MUCINEX DM MAXIMUM STRENGTH) 60-1200 MG TB12 Take 1,200 mg by mouth 2 (two) times daily as needed (for congestion).    Yes Historical Provider, MD  diltiazem (CARDIZEM) 60 MG tablet Take 60 mg by mouth 4 (four) times daily as needed (tachycardia).   Yes Historical Provider, MD  doxazosin (CARDURA) 8 MG tablet Take 8 mg by mouth at bedtime.   Yes Historical Provider, MD  famotidine (PEPCID) 20 MG tablet Take 1 tablet by mouth at  bedtime 01/29/16  Yes Tanda Rockers, MD  furosemide (LASIX) 20 MG tablet Take 2 tablets by mouth  daily 04/01/16  Yes Jerline Pain, MD  gabapentin (NEURONTIN) 100 MG capsule Take 300 mg by mouth 2 (two) times daily. 3 capsules by mouth twice daily   Yes Historical Provider, MD  losartan (COZAAR) 50 MG tablet Take 1 tablet by mouth  daily 10/24/15  Yes Jerline Pain, MD  meclizine (ANTIVERT) 12.5 MG tablet Take 25 mg by mouth every morning. Reported on 08/27/2015   Yes Historical Provider, MD  methocarbamol (ROBAXIN) 500 MG tablet Take 1 tablet (500 mg total) by mouth 3 (three) times daily as needed. 10/08/15  Yes Netta Cedars, MD  Multiple Vitamin (MULTIVITAMIN WITH MINERALS) TABS tablet Take 1 tablet by mouth at bedtime.    Yes Historical Provider, MD  oxybutynin (DITROPAN) 5 MG tablet Take 5 mg by mouth daily as needed for bladder spasms.   Yes Historical Provider, MD  oxyCODONE-acetaminophen (PERCOCET/ROXICET) 5-325 MG tablet Take 2 tablets by mouth every 8 (eight) hours as needed for severe pain. 10/02/15  Yes Merrily Pew, MD  OXYGEN Inhale 2 L into the lungs at bedtime.   Yes Historical Provider, MD  pantoprazole (PROTONIX) 40 MG tablet Take 1 tablet by mouth  daily 01/29/16  Yes Tanda Rockers, MD  potassium chloride SA (K-DUR,KLOR-CON) 20 MEQ tablet Take 1 tablet by mouth  daily 02/25/16  Yes Ria Bush, MD  Respiratory Therapy Supplies (FLUTTER) DEVI Use as directed 02/04/15  Yes Tanda Rockers, MD  Tiotropium Bromide Monohydrate (SPIRIVA  RESPIMAT) 2.5 MCG/ACT AERS Inhale 2 puffs by mouth every morning 01/29/16  Yes Tanda Rockers, MD  budesonide-formoterol [facility]) 160-4.5 MCG/ACT inhaler Inhale 2 puffs into the lungs 2 (two) times daily. 02/24/16   Tanda Rockers, MD  ciprofloxacin (CIPRO) 500 MG tablet Take 1 tablet (500 mg total) by mouth every 12 (twelve) hours. 04/04/16   Jacqualine Mau, NP  nitroGLYCERIN (NITROSTAT) 0.4 MG SL tablet Place 1 tablet (0.4 mg total) under the tongue every 5 (five) minutes as needed for chest pain. 04/08/15   Jerline Pain, MD  Tiotropium Bromide Monohydrate (SPIRIVA RESPIMAT) 2.5 MCG/ACT  AERS Inhale 2 puffs into the lungs daily. 02/24/16   Tanda Rockers, MD   Meds Ordered and Administered this Visit   Medications  ipratropium-albuterol (DUONEB) 0.5-2.5 (3) MG/3ML nebulizer solution 3 mL (3 mLs Nebulization Given 04/04/16 1807)    BP 160/62 (BP Location: Left Arm)   Pulse 73   Temp 98.4 F (36.9 C) (Oral)   Resp 16   SpO2 94% Comment: pt sate is normal  /is on home o2 No data found.   Physical Exam  Constitutional: He is oriented to person, place, and time. He appears well-developed and well-nourished.  HENT:  Nose: Nose normal.  Cardiovascular: Normal rate and regular rhythm.   Pulmonary/Chest: Wheezes:  lower left lobe. He has rales ( lower right).  Neurological: He is alert and oriented to person, place, and time.    Urgent Care Course   Clinical Course    Procedures (including critical care time)  Labs Review Labs Reviewed - No data to display  Imaging Review No results found.   Visual Acuity Review  Right Eye Distance:   Left Eye Distance:   Bilateral Distance:    Right Eye Near:   Left Eye Near:    Bilateral Near:      duoneb ordered for rales to lower right lobe   patient reports improvement with ease of breathing.   MDM   1. URI (upper respiratory infection)       Jacqualine Mau, NP 04/04/16 1844

## 2016-04-04 NOTE — ED Triage Notes (Signed)
Pt c/o cold sx onset yest associated w/runny nose, scratchy throat, prod cough  Taking Zyrtec daily  A&O x4... NAD

## 2016-04-04 NOTE — ED Notes (Signed)
Pt d/c by Eliberto Ivory, NP

## 2016-04-07 DIAGNOSIS — E291 Testicular hypofunction: Secondary | ICD-10-CM | POA: Diagnosis not present

## 2016-04-14 ENCOUNTER — Other Ambulatory Visit: Payer: Self-pay | Admitting: Cardiology

## 2016-04-28 DIAGNOSIS — Z23 Encounter for immunization: Secondary | ICD-10-CM | POA: Diagnosis not present

## 2016-04-28 DIAGNOSIS — E291 Testicular hypofunction: Secondary | ICD-10-CM | POA: Diagnosis not present

## 2016-05-04 ENCOUNTER — Encounter: Payer: Self-pay | Admitting: Internal Medicine

## 2016-05-04 DIAGNOSIS — E119 Type 2 diabetes mellitus without complications: Secondary | ICD-10-CM | POA: Diagnosis not present

## 2016-05-04 DIAGNOSIS — H52223 Regular astigmatism, bilateral: Secondary | ICD-10-CM | POA: Diagnosis not present

## 2016-05-04 DIAGNOSIS — H5203 Hypermetropia, bilateral: Secondary | ICD-10-CM | POA: Diagnosis not present

## 2016-05-04 DIAGNOSIS — H524 Presbyopia: Secondary | ICD-10-CM | POA: Diagnosis not present

## 2016-05-04 MED ORDER — BUDESONIDE-FORMOTEROL FUMARATE 160-4.5 MCG/ACT IN AERO: 2.0000 | INHALATION_SPRAY | Freq: Two times a day (BID) | RESPIRATORY_TRACT | 0 refills | Status: DC

## 2016-05-10 NOTE — Progress Notes (Signed)
[facility]. [address] [place], [place]  [address] Phone: [phone] Fax:  [phone]  Date:  05/11/2016   ID:  Shane Watson, DOB [birth year], MRN [id number]  PCP:  Shirline Frees, MD   History of Present Illness: Shane Watson is a 70 y.o. male with coronary artery disease with percutaneous intervention DES x2 to the mid and proximal LAD 12/18/10 here for followup.   Saw Dr. [name] for chronic bronchitis. Checked oxygen and prescribed for a while. Now only wears when exerts himself. OSA, CPAP.   Wears compression hose for edema.    One night  palpitations HR 150 ? 180 bpm sweating, HR on watch. also on pulse oximeter. Lasted approximately 30 minutes. He took nitroglycerin, this subsided. I prescribed her diltiazem to help with this if necessary. Very rare episodes.  09/2015-shoulder surgery, right. Did well. But still stiff. Dr. Veverly Fells.  No CP, no SOB. Apple watch.   Wt Readings from Last 3 Encounters:  05/11/16 (!) 323 lb 6.4 oz (146.7 kg)  02/24/16 (!) 315 lb (142.9 kg)  11/06/15 (!) 328 lb (148.8 kg)     Past Medical History:  Diagnosis Date  . Allergy   . Anemia   . Arthritis   . BPH (benign prostatic hyperplasia)   . CKD (chronic kidney disease), stage III   . Complication of anesthesia   . COPD (chronic obstructive pulmonary disease) ([place])    a. Uses Home O2 w/ exertion.  . Coronary artery disease    a. 11/2010 Cath: LM nl, LAD 43m (3.5x22 and 3.5x12 Resolute DES'), LCX nl, OM1/2/3 nl, RCA 20-30p, PDA nl.   . Diabetes mellitus without complication ([place])   . GERD (gastroesophageal reflux disease)   . H/O echocardiogram    a. per notes in 11/2010 - nl EF, basal inf wma.  . Heart murmur   . History of kidney stones   . Hyperlipidemia   . Hypertensive heart disease   . Hyperthyroidism   . Neuropathy ([place])   . Neuropathy ([place])   . OSA (obstructive sleep apnea)    a. Uses CPAP.  Marland Kitchen PONV (postoperative nausea and vomiting)   . Vertigo     Past  Surgical History:  Procedure Laterality Date  . 2 heart stents  12/18/10  . CARDIAC CATHETERIZATION    . CHOLECYSTECTOMY    . COLONOSCOPY    . REVERSE SHOULDER ARTHROPLASTY Right 10/08/2015  . REVERSE SHOULDER ARTHROPLASTY Right 10/08/2015   Procedure: REVERSE SHOULDER ARTHROPLASTY;  Surgeon: Netta Cedars, MD;  Location: [facility];  Service: Orthopedics;  Laterality: Right;  . right knee surgery Right 2008ish   "scraped it out"    Current Outpatient Prescriptions  Medication Sig Dispense Refill  . acetaminophen (TYLENOL) 500 MG tablet Take 500 mg by mouth at bedtime.    Marland Kitchen albuterol (PROVENTIL HFA;VENTOLIN HFA) 108 (90 BASE) MCG/ACT inhaler Inhale 2 puffs into the lungs every 4 (four) hours as needed for wheezing or shortness of breath (((PLAN A))).     Marland Kitchen albuterol (PROVENTIL) (5 MG/ML) 0.5% nebulizer solution Take 2.5 mg by nebulization every 4 (four) hours as needed for wheezing or shortness of breath (((PLAN B))).     Marland Kitchen aspirin EC 81 MG tablet Take 81 mg by mouth daily.    Marland Kitchen atorvastatin (LIPITOR) 40 MG tablet Take 1 tablet by mouth at  bedtime 90 tablet 2  . budesonide-formoterol (SYMBICORT) 160-4.5 MCG/ACT inhaler Inhale 2 puffs into the lungs 2 (two)  times daily. 2 Inhaler 0  . cetirizine (ZYRTEC) 10 MG tablet Take 10 mg by mouth every morning.     . colesevelam (WELCHOL) 625 MG tablet Take 1 tablet (625 mg total) by mouth daily with breakfast. 90 tablet 1  . Dextromethorphan-Guaifenesin (MUCINEX DM MAXIMUM STRENGTH) 60-1200 MG TB12 Take 1,200 mg by mouth 2 (two) times daily as needed (for congestion).     Marland Kitchen diltiazem (CARDIZEM) 60 MG tablet Take 60 mg by mouth 4 (four) times daily as needed (tachycardia).    Marland Kitchen doxazosin (CARDURA) 8 MG tablet Take 8 mg by mouth at bedtime.    . famotidine (PEPCID) 20 MG tablet Take 1 tablet by mouth at  bedtime 90 tablet 1  . furosemide (LASIX) 20 MG tablet Take 2 tablets by mouth  daily 180 tablet 2  . gabapentin (NEURONTIN) 100 MG capsule Take 300 mg by  mouth 2 (two) times daily. 3 capsules by mouth twice daily    . JANUVIA 100 MG tablet Take 100 mg by mouth daily.    Marland Kitchen losartan (COZAAR) 50 MG tablet TAKE 1 TABLET BY MOUTH  DAILY 90 tablet 1  . meclizine (ANTIVERT) 12.5 MG tablet Take 12.5 mg by mouth 2 (two) times daily.    . methocarbamol (ROBAXIN) 500 MG tablet Take 1 tablet (500 mg total) by mouth 3 (three) times daily as needed. 60 tablet 1  . Multiple Vitamin (MULTIVITAMIN WITH MINERALS) TABS tablet Take 1 tablet by mouth at bedtime.     . nitroGLYCERIN (NITROSTAT) 0.4 MG SL tablet Place 1 tablet (0.4 mg total) under the tongue every 5 (five) minutes as needed for chest pain. 25 tablet prn  . oxybutynin (DITROPAN) 5 MG tablet Take 5 mg by mouth daily as needed for bladder spasms.    Marland Kitchen oxyCODONE-acetaminophen (PERCOCET/ROXICET) 5-325 MG tablet Take 2 tablets by mouth every 8 (eight) hours as needed for severe pain. 60 tablet 0  . OXYGEN Inhale 2 L into the lungs at bedtime.    . pantoprazole (PROTONIX) 40 MG tablet Take 1 tablet by mouth  daily 90 tablet 1  . potassium chloride SA (K-DUR,KLOR-CON) 20 MEQ tablet Take 1 tablet by mouth  daily 90 tablet 2  . Respiratory Therapy Supplies (FLUTTER) DEVI Use as directed 1 each 0  . Tiotropium Bromide Monohydrate (SPIRIVA RESPIMAT) 2.5 MCG/ACT AERS Inhale 2 puffs by mouth every morning 3 Inhaler 3   No current facility-administered medications for this visit.    Facility-Administered Medications Ordered in Other Visits  Medication Dose Route Frequency Provider Last Rate Last Dose  . bupivacaine-epinephrine (MARCAINE W/ EPI) 0.5% -1:200000 injection    Anesthesia Intra-op Lorrene Reid, MD   25 mL at 10/07/15 1734    Allergies:    Allergies  Allergen Reactions  . Aspirin Swelling    Angioedema-must take EC coated form only  . Ibuprofen Swelling    Angioedema   . Penicillins Hives  . Sulfa Antibiotics Hives    Social History:  The patient  reports that he quit smoking about 29 years ago.  His smoking use included Cigarettes. He has a 22.50 pack-year smoking history. He has never used smokeless tobacco. He reports that he drinks alcohol. He reports that he does not use drugs.   ROS:  Please see the history of present illness.   PHYSICAL EXAM: VS:  BP 138/64   Pulse 68   Ht 5\' 11"  (1.803 m)   Wt (!) 323 lb 6.4 oz (146.7 kg)  BMI 45.11 kg/m  Well nourished, well developed, in no acute distress  HEENT: normal thick neck  Neck: no JVD  Cardiac:  normal S1, S2; RRR; no murmur  Lungs:  clear to auscultation bilaterally, no wheezing, rhonchi or rales  Abd: soft, nontender, no hepatomegaly Morbidly obese Ext: 1+ edema, compression hose.  Limited range of motion of right shoulder. Skin: warm and dry Right arm sling Neuro: no focal abnormalities noted, mild tremor noted right hand.   EKG:   Today 04/08/15-sinus rhythm, 85, no other abnormalities personally viewed-prior 10/11/13-normal sinus rhythm, 66, no other changes.    Labs: LDL 2013 was 54.  Dr. Kenton Kingfisher watching  ASSESSMENT AND PLAN:  1. Coronary artery disease-PCI to DES 2 to the LAD 11/2010. No chest pain, no angina. Stable no angina. 2. Hypertension-well controlled today. Continue current medications.  Monitor. 3. Morbid obesity-strongly encourage weight loss.this will help with a multitude of his medical issues. Once again discussed. 4. COPD - Dr. [name]. O2. Pulm rehab. 5. OSA - CPAP with O2 6. Hyperlipidemia - Lipitor, LDL goal 70. At goal previously. 7. Chronic Kidney disease stage 3-creatinine 1.5.  8. Palpitations-I prescribed to him diltiazem 60 mg short acting every 6 hours as needed. if this continues to occur, we will place an event monitor. Given his morbid obesity and other conditions, this could be manifestation of atrial flutter or perhaps fibrillation. Only 2 episodes over past year.  9. Diabetes type 2-Tradjenta 10. 6 month followup with APP, 12 months with me  Signed, Candee Furbish, MD [facility]  05/11/2016  8:38 AM

## 2016-05-11 ENCOUNTER — Encounter: Payer: Self-pay | Admitting: Cardiology

## 2016-05-11 ENCOUNTER — Ambulatory Visit (INDEPENDENT_AMBULATORY_CARE_PROVIDER_SITE_OTHER): Payer: Medicare Other | Admitting: Cardiology

## 2016-05-11 VITALS — BP 138/64 | HR 68 | Ht 71.0 in | Wt 323.4 lb

## 2016-05-11 DIAGNOSIS — I2583 Coronary atherosclerosis due to lipid rich plaque: Secondary | ICD-10-CM

## 2016-05-11 DIAGNOSIS — I2510 Atherosclerotic heart disease of native coronary artery without angina pectoris: Secondary | ICD-10-CM | POA: Diagnosis not present

## 2016-05-11 DIAGNOSIS — G4733 Obstructive sleep apnea (adult) (pediatric): Secondary | ICD-10-CM

## 2016-05-11 NOTE — Patient Instructions (Signed)
Medication Instructions:  The current medical regimen is effective;  continue present plan and medications.  Follow-Up: Follow up in 6 months with Katy Thompson, PA.  You will receive a letter in the mail 2 months before you are due.  Please call us when you receive this letter to schedule your follow up appointment.  Follow up in 1 year with Dr. Skains.  You will receive a letter in the mail 2 months before you are due.  Please call us when you receive this letter to schedule your follow up appointment.  If you need a refill on your cardiac medications before your next appointment, please call your pharmacy.  Thank you for choosing [facility]!!     

## 2016-05-26 DIAGNOSIS — E7800 Pure hypercholesterolemia, unspecified: Secondary | ICD-10-CM | POA: Diagnosis not present

## 2016-05-26 DIAGNOSIS — E119 Type 2 diabetes mellitus without complications: Secondary | ICD-10-CM | POA: Diagnosis not present

## 2016-05-26 DIAGNOSIS — E291 Testicular hypofunction: Secondary | ICD-10-CM | POA: Diagnosis not present

## 2016-05-26 DIAGNOSIS — N3281 Overactive bladder: Secondary | ICD-10-CM | POA: Diagnosis not present

## 2016-05-26 DIAGNOSIS — Z7984 Long term (current) use of oral hypoglycemic drugs: Secondary | ICD-10-CM | POA: Diagnosis not present

## 2016-05-26 DIAGNOSIS — N183 Chronic kidney disease, stage 3 (moderate): Secondary | ICD-10-CM | POA: Diagnosis not present

## 2016-05-26 DIAGNOSIS — N400 Enlarged prostate without lower urinary tract symptoms: Secondary | ICD-10-CM | POA: Diagnosis not present

## 2016-05-26 DIAGNOSIS — G609 Hereditary and idiopathic neuropathy, unspecified: Secondary | ICD-10-CM | POA: Diagnosis not present

## 2016-05-26 DIAGNOSIS — I872 Venous insufficiency (chronic) (peripheral): Secondary | ICD-10-CM | POA: Diagnosis not present

## 2016-05-26 DIAGNOSIS — Z125 Encounter for screening for malignant neoplasm of prostate: Secondary | ICD-10-CM | POA: Diagnosis not present

## 2016-05-26 DIAGNOSIS — I10 Essential (primary) hypertension: Secondary | ICD-10-CM | POA: Diagnosis not present

## 2016-05-26 DIAGNOSIS — J449 Chronic obstructive pulmonary disease, unspecified: Secondary | ICD-10-CM | POA: Diagnosis not present

## 2016-06-12 DIAGNOSIS — M545 Low back pain: Secondary | ICD-10-CM | POA: Diagnosis not present

## 2016-06-16 DIAGNOSIS — E291 Testicular hypofunction: Secondary | ICD-10-CM | POA: Diagnosis not present

## 2016-06-20 ENCOUNTER — Other Ambulatory Visit: Payer: Self-pay | Admitting: Cardiology

## 2016-06-26 DIAGNOSIS — E291 Testicular hypofunction: Secondary | ICD-10-CM | POA: Diagnosis not present

## 2016-07-07 DIAGNOSIS — E291 Testicular hypofunction: Secondary | ICD-10-CM | POA: Diagnosis not present

## 2016-07-09 DIAGNOSIS — J441 Chronic obstructive pulmonary disease with (acute) exacerbation: Secondary | ICD-10-CM | POA: Diagnosis not present

## 2016-07-28 DIAGNOSIS — E291 Testicular hypofunction: Secondary | ICD-10-CM | POA: Diagnosis not present

## 2016-08-13 DIAGNOSIS — B349 Viral infection, unspecified: Secondary | ICD-10-CM | POA: Diagnosis not present

## 2016-08-13 DIAGNOSIS — R05 Cough: Secondary | ICD-10-CM | POA: Diagnosis not present

## 2016-08-18 DIAGNOSIS — E291 Testicular hypofunction: Secondary | ICD-10-CM | POA: Diagnosis not present

## 2016-08-21 ENCOUNTER — Other Ambulatory Visit: Payer: Self-pay | Admitting: Internal Medicine

## 2016-08-27 ENCOUNTER — Encounter: Payer: Self-pay | Admitting: Internal Medicine

## 2016-08-27 ENCOUNTER — Ambulatory Visit (INDEPENDENT_AMBULATORY_CARE_PROVIDER_SITE_OTHER): Payer: Medicare Other | Admitting: Internal Medicine

## 2016-08-27 VITALS — BP 154/64 | HR 59 | Ht 71.0 in | Wt 323.2 lb

## 2016-08-27 DIAGNOSIS — J9612 Chronic respiratory failure with hypercapnia: Secondary | ICD-10-CM | POA: Diagnosis not present

## 2016-08-27 DIAGNOSIS — J449 Chronic obstructive pulmonary disease, unspecified: Secondary | ICD-10-CM | POA: Diagnosis not present

## 2016-08-27 MED ORDER — BUDESONIDE-FORMOTEROL FUMARATE 160-4.5 MCG/ACT IN AERO: 2.0000 | INHALATION_SPRAY | Freq: Two times a day (BID) | RESPIRATORY_TRACT | 0 refills | Status: DC

## 2016-08-27 MED ORDER — ALBUTEROL SULFATE HFA 108 (90 BASE) MCG/ACT IN AERS: INHALATION_SPRAY | RESPIRATORY_TRACT | 11 refills | Status: DC

## 2016-08-27 MED ORDER — PREDNISONE 10 MG PO TABS: ORAL_TABLET | ORAL | 0 refills | Status: DC

## 2016-08-27 NOTE — Patient Instructions (Signed)
Prednisone 10 mg take  4 each am x 2 days,   2 each am x 2 days,  1 each am x 2 days and stop   Plan A = Automatic = symbicort 160/spiriva respimat as you are   Plan B = Backup Only use your albuterol as a rescue medication to be used if you can't catch your breath by resting or doing a relaxed purse lip breathing pattern.  - The less you use it, the better it will work when you need it. - Ok to use the inhaler up to 2 puffs  every 4 hours if you must but call for appointment if use goes up over your usual need - Don't leave home without it !!  (think of it like the spare tire for your car)   Plan C = Crisis - only use your albuterol nebulizer if you first try Plan B and it fails to help > ok to use the nebulizer up to every 4 hours but if start needing it regularly call for immediate appointment   Plan D = Doctor - call me if B and C not adequate  Plan E = ER - go to ER or call 911 if all else fails     Please schedule a follow up visit in 6 months but call sooner if needed

## 2016-08-27 NOTE — Progress Notes (Signed)
Subjective:   Patient ID: Shane Watson, male    DOB: 08/19/1945   MRN: [id number]    Brief patient profile:  57  yowm  Quit smoking 1987 at wt = 240   Referred 05/02/2013 by [name] for low 02. Sees [name] for primary and on cpap per his office with chronic R HD elevation documented first in 2005 c/w anterior eventration with GOLD II criteria copd 05/2013     History of Present Illness  05/02/2013 1st [name] office visit/ [name] cc indolent onset progressive doe x sev years with w/u by [name] with stent seemed to help at that point and no change uses HC sticker to go shopping and leans on cart at HT due to back but  does do some hunting walking x sev hundred feet.  Uses alb seems to help some with assoc subj wheeze rec Breo one puff daily automatically to see if breathing improves Please see patient coordinator before you leave today  to schedule overnight 02 sat on cpap> rx 2lpm      02/04/2015 f/u ov/Delontae Lamm re: Morbid obesity/ GOLD II on sym/spirva respimat no longer using med calendar/ action plan Chief Complaint  Patient presents with  . Follow-up    Pt c/o increased SOB and cough for the past wk "getting better" with taking zpack and pred.     Baseline sob did not change until 10 days  prior to OV  With cough / congestion with dark mucus > rx mucinex  Seen 02/02/15 rec change mucinex dm to mucinex and zpak prednisone 20 x 5 days  Did not use hfa before moving to neb saba as per prev action plan  rec Work on perfecting  inhaler technique      05/17/2015 acute extended ov/[name] re: aecopd /GOLD II/ MO Chief Complaint  Patient presents with  . Acute Visit    Pt c/o increased cough for the past wk- prod with grey in the am and light yellow throughout the day. Cough seems worse at night. He has also been wheezing and feeling more SOB.   At acute onset of cough x one week prior to OV  started taking mucinex and deslym, not mucinex dm but otherwise following  action plan on med calendar  cough prod thick yellow mucus  Comfortable at rest and has not needed neb saba yet rec Prednisone 10 mg take  4 each am x 2 days,   2 each am x 2 days,  1 each am x 2 days and stop  Doxycycline 100 mg Take 30  min before your first and last meals of the day with glass of water  See calendar for specific medication instructions and bring it back for each and every office visit    08/27/2016  f/u ov/[name] re:  GOLD II / MO on symb 160/spiriva  Chief Complaint  Patient presents with  . Follow-up    COPD, has been doing well but had a coughing episode 2 weeks ago, was seen by urgent care   new problem is pain R temple / R Face s nasal symptoms only has it from  2-7pm assoc with water nasal d/c - never purulent and discharge and pain never in am or other part of the day  Also not back to baseline saba need since last flare with uri rx by UC with abx no pred   No obvious day to day or daytime variabilty or assoc excess/ purulent sputum or mucus plugs  cp or chest tightness, subjective wheeze  Or overt  hb symptoms. No unusual exp hx or h/o childhood pna/ asthma or knowledge of premature birth.  Sleeping ok without nocturnal  or early am exacerbation  of respiratory  c/o's or need for noct saba. Also denies any obvious fluctuation of symptoms with weather or environmental changes or other aggravating or alleviating factors except as outlined above   Current Medications, Allergies, Complete Past Medical History, Past Surgical History, Family History, and Social History were reviewed in [employer] record.  ROS  The following are not active complaints unless bolded sore throat, dysphagia, dental problems, itching, sneezing,  nasal congestion or excess/ purulent secretions, ear ache,   fever, chills, sweats, unintended wt loss, pleuritic or exertional cp, hemoptysis,  orthopnea pnd or leg swelling sym bilaterally wearing elastic hose , presyncope,  palpitations, heartburn, abdominal pain, anorexia, nausea, vomiting, diarrhea  or change in bowel or urinary habits, change in stools or urine, dysuria,hematuria,  rash, arthralgias, visual complaints, headache, numbness weakness or ataxia or problems with walking or coordination,  change in mood/affect or memory.           Objective:   Physical Exam  08/10/2013      352 > 350 08/25/2013 > 10/17/2013 351 > 01/15/2014  345 > 05/08/2014 345 > 08/06/2014  354 > 02/04/2015 354 > 02/25/2015  354 > 05/17/2015 351 > 05/27/2015  349 > 08/27/2015  335 > 02/24/2016   315 > 08/27/2016  323    amb massively obese wm/ vital signs reviewed    - Note on arrival 02 sats  95% on RA    HEENT: nl dentition, turbinates, and oropharynx. Nl external ear canals without cough reflex   NECK :  without JVD/Nodes/TM/ nl carotid upstrokes bilaterally   LUNGS: no acc muscle use, scattered insp and exp rhonchi   CV:  RRR  no s3 or murmur or increase in P2, wearing TEDS with bilateral  Still 2+ pitting edema with chronic venous stasis changes   ABD:  Massively obese soft and nontender with nl excursion in the supine position. No bruits or organomegaly, bowel sounds nl  MS:  warm without deformities, calf tenderness, cyanosis or clubbing  SKIN: warm and dry without lesions         Assessment & Plan:

## 2016-08-31 ENCOUNTER — Encounter: Payer: Self-pay | Admitting: Internal Medicine

## 2016-08-31 DIAGNOSIS — J0110 Acute frontal sinusitis, unspecified: Secondary | ICD-10-CM | POA: Diagnosis not present

## 2016-08-31 NOTE — Assessment & Plan Note (Signed)
Body mass index is 45.08 kg/m.  trending up  Lab Results  Component Value Date   TSH 0.82 02/04/2015     Contributing to gerd risk/ doe/reviewed the need and the process to achieve and maintain neg calorie balance > defer f/u primary care including intermittently monitoring thyroid status

## 2016-08-31 NOTE — Assessment & Plan Note (Signed)
-   HCO3 33 02/04/15  - 02/25/2015 no desat's walking  - 08/27/2015  Walked RA x 3 laps @ 185 ft each stopped due to  End of study, nl pace, no sob or desat     rec as of 08/31/2016 02 2lpm with cpap only at bedtime

## 2016-08-31 NOTE — Assessment & Plan Note (Addendum)
-   started breo 05/02/13 > d/c 06/06/13 and replaced with symbicort   - PFT's 06/06/13  FEV1 1.59 (47%) ratio 62 and 13% better despite  breo am ov test, DLC0 82 corrects to 113% - med calendar    08/25/2013   - flutter valve added 02/04/15  - 02/25/2015  Walked RA x 3 laps @ 185 ft each stopped due to End of study, nl pace, no desat  / min sob/ some hip and leg pain  - referred to rehab 02/25/2015   - re-inforced flutter valve 05/27/2015    - 08/27/2016  After extensive coaching HFA effectiveness =    90%   Mild flare with uri > rx Prednisone 10 mg take  4 each am x 2 days,   2 each am x 2 days,  1 each am x 2 days and stop   I had an extended discussion with the patient reviewing all relevant studies completed to date and  lasting 15 to 20 minutes of a 25 minute visit    Each maintenance medication was reviewed in detail including most importantly the difference between maintenance and prns and under what circumstances the prns are to be triggered using an action plan format that is not reflected in the computer generated alphabetically organized AVS.    Please see AVS for specific instructions unique to this visit that I personally wrote and verbalized to the the pt in detail and then reviewed with pt  by my nurse highlighting any  changes in therapy recommended at today's visit to their plan of care.

## 2016-09-08 ENCOUNTER — Other Ambulatory Visit: Payer: Self-pay | Admitting: Internal Medicine

## 2016-09-08 DIAGNOSIS — E291 Testicular hypofunction: Secondary | ICD-10-CM | POA: Diagnosis not present

## 2016-09-17 DIAGNOSIS — S42291D Other displaced fracture of upper end of right humerus, subsequent encounter for fracture with routine healing: Secondary | ICD-10-CM | POA: Diagnosis not present

## 2016-09-17 DIAGNOSIS — Z96611 Presence of right artificial shoulder joint: Secondary | ICD-10-CM | POA: Diagnosis not present

## 2016-09-17 DIAGNOSIS — Z471 Aftercare following joint replacement surgery: Secondary | ICD-10-CM | POA: Diagnosis not present

## 2016-09-19 DIAGNOSIS — N183 Chronic kidney disease, stage 3 (moderate): Secondary | ICD-10-CM | POA: Diagnosis not present

## 2016-09-19 DIAGNOSIS — M62830 Muscle spasm of back: Secondary | ICD-10-CM | POA: Diagnosis not present

## 2016-09-29 DIAGNOSIS — E291 Testicular hypofunction: Secondary | ICD-10-CM | POA: Diagnosis not present

## 2016-09-30 IMAGING — DX DG PELVIS 1-2V
1 series · 1 of 1 positions shown · non-contrast
Comparison: None.

CLINICAL DATA: Status post fall.  Right hip pain.

EXAM:
PELVIS - 1-2 VIEW

[pelvis ap]
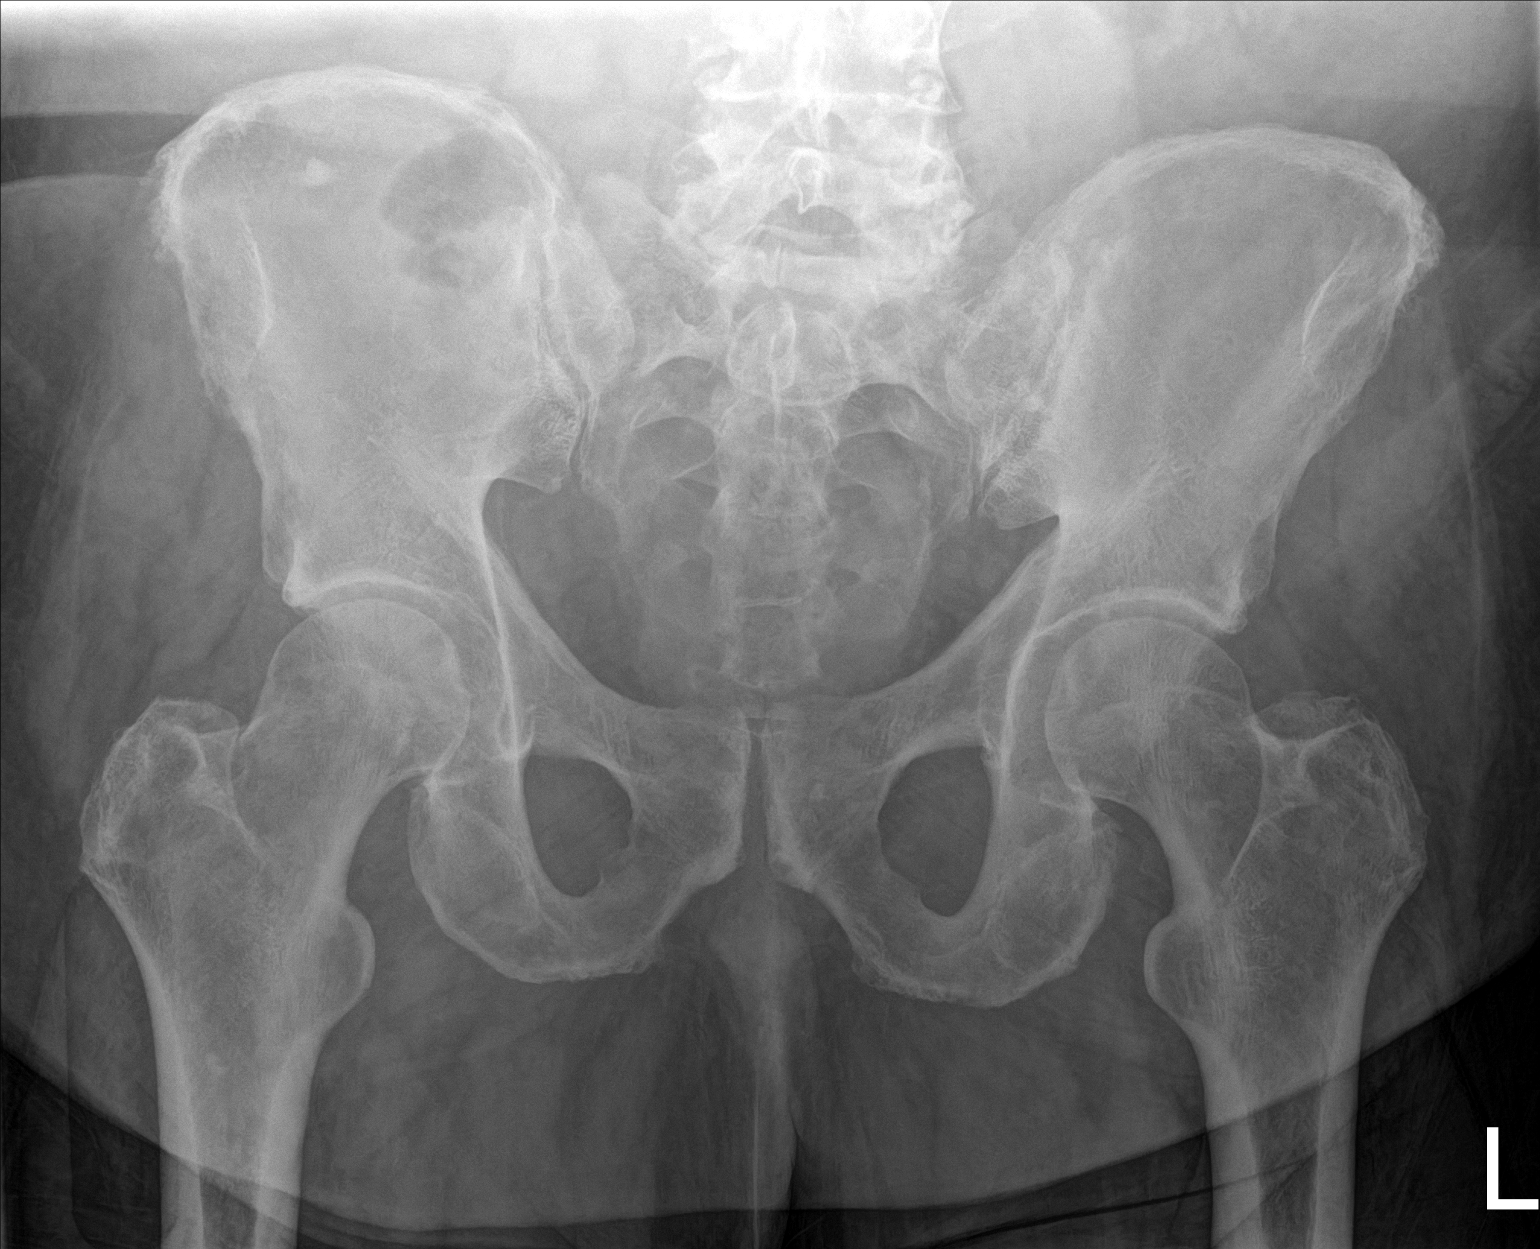

[1 of 1 positions shown; findings below may reference images not displayed]

FINDINGS: Cortical irregularity of the distal right femoral neck concerning
for nondisplaced fracture. No pelvic bone lesions are seen.
IMPRESSION: Cortical irregularity of the distal right femoral neck concerning
for nondisplaced fracture. Recommend dedicated 2 view xray of the
right hip.

## 2016-10-01 ENCOUNTER — Other Ambulatory Visit: Payer: Self-pay | Admitting: Cardiology

## 2016-10-20 DIAGNOSIS — E291 Testicular hypofunction: Secondary | ICD-10-CM | POA: Diagnosis not present

## 2016-10-23 ENCOUNTER — Telehealth: Payer: Self-pay | Admitting: Internal Medicine

## 2016-10-23 NOTE — Telephone Encounter (Signed)
Pt requesting pt assistance forms for spiriva and symbicort.  These have been mailed to pt's verified home address.  Nothing further needed at this time.

## 2016-10-30 DIAGNOSIS — K219 Gastro-esophageal reflux disease without esophagitis: Secondary | ICD-10-CM | POA: Diagnosis not present

## 2016-10-30 DIAGNOSIS — E291 Testicular hypofunction: Secondary | ICD-10-CM | POA: Diagnosis not present

## 2016-10-30 DIAGNOSIS — J449 Chronic obstructive pulmonary disease, unspecified: Secondary | ICD-10-CM | POA: Diagnosis not present

## 2016-10-30 DIAGNOSIS — N3281 Overactive bladder: Secondary | ICD-10-CM | POA: Diagnosis not present

## 2016-10-30 DIAGNOSIS — G4730 Sleep apnea, unspecified: Secondary | ICD-10-CM | POA: Diagnosis not present

## 2016-10-30 DIAGNOSIS — I10 Essential (primary) hypertension: Secondary | ICD-10-CM | POA: Diagnosis not present

## 2016-10-30 DIAGNOSIS — E7800 Pure hypercholesterolemia, unspecified: Secondary | ICD-10-CM | POA: Diagnosis not present

## 2016-10-30 DIAGNOSIS — E119 Type 2 diabetes mellitus without complications: Secondary | ICD-10-CM | POA: Diagnosis not present

## 2016-10-30 DIAGNOSIS — N183 Chronic kidney disease, stage 3 (moderate): Secondary | ICD-10-CM | POA: Diagnosis not present

## 2016-10-30 DIAGNOSIS — H8110 Benign paroxysmal vertigo, unspecified ear: Secondary | ICD-10-CM | POA: Diagnosis not present

## 2016-11-10 DIAGNOSIS — E291 Testicular hypofunction: Secondary | ICD-10-CM | POA: Diagnosis not present

## 2016-11-23 DIAGNOSIS — N400 Enlarged prostate without lower urinary tract symptoms: Secondary | ICD-10-CM | POA: Diagnosis not present

## 2016-11-23 DIAGNOSIS — J410 Simple chronic bronchitis: Secondary | ICD-10-CM | POA: Diagnosis not present

## 2016-11-23 DIAGNOSIS — N183 Chronic kidney disease, stage 3 (moderate): Secondary | ICD-10-CM | POA: Diagnosis not present

## 2016-11-23 DIAGNOSIS — N3281 Overactive bladder: Secondary | ICD-10-CM | POA: Diagnosis not present

## 2016-11-23 DIAGNOSIS — Z125 Encounter for screening for malignant neoplasm of prostate: Secondary | ICD-10-CM | POA: Diagnosis not present

## 2016-11-23 DIAGNOSIS — E291 Testicular hypofunction: Secondary | ICD-10-CM | POA: Diagnosis not present

## 2016-11-23 DIAGNOSIS — E119 Type 2 diabetes mellitus without complications: Secondary | ICD-10-CM | POA: Diagnosis not present

## 2016-11-23 DIAGNOSIS — I10 Essential (primary) hypertension: Secondary | ICD-10-CM | POA: Diagnosis not present

## 2016-11-23 DIAGNOSIS — E7800 Pure hypercholesterolemia, unspecified: Secondary | ICD-10-CM | POA: Diagnosis not present

## 2016-11-27 ENCOUNTER — Encounter: Payer: Self-pay | Admitting: Internal Medicine

## 2016-11-27 NOTE — Telephone Encounter (Signed)
MW  Please see pt email

## 2016-12-01 DIAGNOSIS — E291 Testicular hypofunction: Secondary | ICD-10-CM | POA: Diagnosis not present

## 2016-12-23 DIAGNOSIS — E291 Testicular hypofunction: Secondary | ICD-10-CM | POA: Diagnosis not present

## 2016-12-24 ENCOUNTER — Ambulatory Visit
Admission: RE | Admit: 2016-12-24 | Discharge: 2016-12-24 | Disposition: A | Discharge status: Home / Self Care | Payer: No Typology Code available for payment source | Source: Ambulatory Visit | Attending: Family Medicine | Admitting: Family Medicine

## 2016-12-24 ENCOUNTER — Other Ambulatory Visit: Payer: Self-pay | Admitting: Family Medicine

## 2016-12-24 DIAGNOSIS — J449 Chronic obstructive pulmonary disease, unspecified: Secondary | ICD-10-CM

## 2017-01-12 DIAGNOSIS — E291 Testicular hypofunction: Secondary | ICD-10-CM | POA: Diagnosis not present

## 2017-02-02 DIAGNOSIS — E291 Testicular hypofunction: Secondary | ICD-10-CM | POA: Diagnosis not present

## 2017-02-08 ENCOUNTER — Other Ambulatory Visit: Payer: Self-pay | Admitting: Internal Medicine

## 2017-02-08 ENCOUNTER — Other Ambulatory Visit: Payer: Self-pay | Admitting: Cardiology

## 2017-02-21 ENCOUNTER — Encounter: Payer: Self-pay | Admitting: Internal Medicine

## 2017-02-25 ENCOUNTER — Ambulatory Visit: Payer: Medicare Other | Admitting: Internal Medicine

## 2017-02-26 DIAGNOSIS — E291 Testicular hypofunction: Secondary | ICD-10-CM | POA: Diagnosis not present

## 2017-03-02 ENCOUNTER — Ambulatory Visit: Payer: No Typology Code available for payment source | Admitting: Adult Health

## 2017-03-22 ENCOUNTER — Other Ambulatory Visit: Payer: Self-pay | Admitting: Cardiology

## 2017-03-23 DIAGNOSIS — E291 Testicular hypofunction: Secondary | ICD-10-CM | POA: Diagnosis not present

## 2017-03-24 ENCOUNTER — Ambulatory Visit: Payer: No Typology Code available for payment source | Admitting: Internal Medicine

## 2017-03-25 ENCOUNTER — Other Ambulatory Visit: Payer: Self-pay | Admitting: Family Medicine

## 2017-03-25 ENCOUNTER — Ambulatory Visit
Admission: RE | Admit: 2017-03-25 | Discharge: 2017-03-25 | Disposition: A | Discharge status: Home / Self Care | Payer: Medicare Other | Source: Ambulatory Visit | Attending: Family Medicine | Admitting: Family Medicine

## 2017-03-25 DIAGNOSIS — R5081 Fever presenting with conditions classified elsewhere: Secondary | ICD-10-CM | POA: Diagnosis not present

## 2017-03-25 DIAGNOSIS — R1031 Right lower quadrant pain: Secondary | ICD-10-CM

## 2017-03-25 MED ORDER — IOPAMIDOL (ISOVUE-300) INJECTION 61%
80.0000 mL | Freq: Once | INTRAVENOUS | Status: AC | PRN
  Administered 2017-03-25: 80 mL via INTRAVENOUS

## 2017-03-27 DIAGNOSIS — L03116 Cellulitis of left lower limb: Secondary | ICD-10-CM | POA: Diagnosis not present

## 2017-03-29 ENCOUNTER — Encounter: Payer: Self-pay | Admitting: Internal Medicine

## 2017-03-29 ENCOUNTER — Ambulatory Visit (INDEPENDENT_AMBULATORY_CARE_PROVIDER_SITE_OTHER): Payer: Medicare Other | Admitting: Internal Medicine

## 2017-03-29 VITALS — BP 134/74 | HR 80 | Ht 71.0 in | Wt 336.2 lb

## 2017-03-29 DIAGNOSIS — L03116 Cellulitis of left lower limb: Secondary | ICD-10-CM | POA: Diagnosis not present

## 2017-03-29 DIAGNOSIS — J9612 Chronic respiratory failure with hypercapnia: Secondary | ICD-10-CM | POA: Diagnosis not present

## 2017-03-29 DIAGNOSIS — J449 Chronic obstructive pulmonary disease, unspecified: Secondary | ICD-10-CM

## 2017-03-29 DIAGNOSIS — R918 Other nonspecific abnormal finding of lung field: Secondary | ICD-10-CM | POA: Diagnosis not present

## 2017-03-29 NOTE — Progress Notes (Signed)
Subjective:   Patient ID: Shane Watson, male    DOB: 1945-12-26   MRN: [id number]    Brief patient profile:  6  yowm  Quit smoking 1987 at wt = 240   Referred 05/02/2013 by [name] for low 02. Sees [name] for primary and on cpap per his office with chronic R HD elevation documented first in 2005 c/w anterior eventration with GOLD II criteria copd 05/2013     History of Present Illness  05/02/2013 1st [facility] office visit/ Hermenia Fritcher cc indolent onset progressive doe x sev years with w/u by [name] with stent seemed to help at that point and no change uses HC sticker to go shopping and leans on cart at HT due to back but  does do some hunting walking x sev hundred feet.  Uses alb seems to help some with assoc subj wheeze rec Breo one puff daily automatically to see if breathing improves Please see patient coordinator before you leave today  to schedule overnight 02 sat on cpap> rx 2lpm      02/04/2015 f/u ov/Ilynn Stauffer re: Morbid obesity/ GOLD II on sym/spirva respimat no longer using med calendar/ action plan Chief Complaint  Patient presents with  . Follow-up    Pt c/o increased SOB and cough for the past wk "getting better" with taking zpack and pred.     Baseline sob did not change until 10 days  prior to OV  With cough / congestion with dark mucus > rx mucinex  Seen 02/02/15 rec change mucinex dm to mucinex and zpak prednisone 20 x 5 days  Did not use hfa before moving to neb saba as per prev action plan  rec Work on perfecting  inhaler technique      05/17/2015 acute extended ov/Deandrae Wajda re: aecopd /GOLD II/ MO Chief Complaint  Patient presents with  . Acute Visit    Pt c/o increased cough for the past wk- prod with grey in the am and light yellow throughout the day. Cough seems worse at night. He has also been wheezing and feeling more SOB.   At acute onset of cough x one week prior to OV  started taking mucinex and deslym, not mucinex dm but otherwise following  action plan on med calendar  cough prod thick yellow mucus  Comfortable at rest and has not needed neb saba yet rec Prednisone 10 mg take  4 each am x 2 days,   2 each am x 2 days,  1 each am x 2 days and stop  Doxycycline 100 mg Take 30  min before your first and last meals of the day with glass of water  See calendar for specific medication instructions and bring it back for each and every office visit    03/29/2017  f/u ov/Emberly Tomasso re: new dx of lung nodules/ on pharmquest protocol for copd Chief Complaint  Patient presents with  . Follow-up    Pt states Dr. Kenton Kingfisher found nodules in lungs. Patient states he has no symptoms.   first fever x 03/25/17 with shaking chills, last fever on 03/27/17 rx keflex for cellulitis improving  No cough and no change in doe  No obvious day to day or daytime variability or assoc excess/ purulent sputum or mucus plugs or hemoptysis or cp or chest tightness, subjective wheeze or overt sinus or hb symptoms. No unusual exp hx or h/o childhood pna/ asthma or knowledge of premature birth.  Sleeping ok flat without nocturnal  or early am  exacerbation  of respiratory  c/o's or need for noct saba. Also denies any obvious fluctuation of symptoms with weather or environmental changes or other aggravating or alleviating factors except as outlined above   Current Allergies, Complete Past Medical History, Past Surgical History, Family History, and Social History were reviewed in [employer] record.  ROS  The following are not active complaints unless bolded sore throat, dysphagia, dental problems, itching, sneezing,  nasal congestion or disharge of excess mucus or purulent secretions, ear ache,   fever, chills, sweats, unintended wt loss or wt gain, classically pleuritic or exertional cp,  orthopnea pnd or leg swelling, presyncope, palpitations, abdominal pain, anorexia, nausea, vomiting, diarrhea  or change in bowel habits or bladder habits, change in  stools or change in urine, dysuria, hematuria,  rash, arthralgias, visual complaints, headache, numbness, weakness or ataxia or problems with walking or coordination,  change in mood/affect or memory.        Current Meds  Medication Sig  . acetaminophen (TYLENOL) 500 MG tablet Take 500 mg by mouth at bedtime.  Marland Kitchen albuterol (PROAIR HFA) 108 (90 Base) MCG/ACT inhaler 2 puffs every 4 hours as needed only  if your can't catch your breath  . albuterol (PROVENTIL) (5 MG/ML) 0.5% nebulizer solution Take 2.5 mg by nebulization every 4 (four) hours as needed for wheezing or shortness of breath (((PLAN B))).   Marland Kitchen aspirin EC 81 MG tablet Take 81 mg by mouth daily.  Marland Kitchen atorvastatin (LIPITOR) 40 MG tablet TAKE 1 TABLET BY MOUTH AT  BEDTIME  . cetirizine (ZYRTEC) 10 MG tablet Take 10 mg by mouth every morning.   . colesevelam (WELCHOL) 625 MG tablet Take 1 tablet (625 mg total) by mouth daily with breakfast.  . Dextromethorphan-Guaifenesin (MUCINEX DM MAXIMUM STRENGTH) 60-1200 MG TB12 Take 1,200 mg by mouth 2 (two) times daily as needed (for congestion).   Marland Kitchen diltiazem (CARDIZEM) 60 MG tablet Take 60 mg by mouth 4 (four) times daily as needed (tachycardia).  Marland Kitchen doxazosin (CARDURA) 8 MG tablet Take 8 mg by mouth at bedtime.  . famotidine (PEPCID) 20 MG tablet TAKE 1 TABLET BY MOUTH AT  BEDTIME  . furosemide (LASIX) 20 MG tablet TAKE 2 TABLETS BY MOUTH  DAILY  . gabapentin (NEURONTIN) 100 MG capsule Take 300 mg by mouth 2 (two) times daily. 3 capsules by mouth twice daily  . ipratropium (ATROVENT) 0.06 % nasal spray Place 2 sprays into both nostrils 4 (four) times daily.  Marland Kitchen JANUVIA 100 MG tablet Take 100 mg by mouth daily.  Marland Kitchen losartan (COZAAR) 50 MG tablet TAKE 1 TABLET BY MOUTH  DAILY  . meclizine (ANTIVERT) 12.5 MG tablet Take 12.5 mg by mouth 2 (two) times daily.  . Multiple Vitamin (MULTIVITAMIN WITH MINERALS) TABS tablet Take 1 tablet by mouth at bedtime.   . nitroGLYCERIN (NITROSTAT) 0.4 MG SL tablet Place 1  tablet (0.4 mg total) under the tongue every 5 (five) minutes as needed for chest pain.  Marland Kitchen oxybutynin (DITROPAN) 5 MG tablet Take 5 mg by mouth daily as needed for bladder spasms.  . OXYGEN Inhale 2 L into the lungs at bedtime.  . pantoprazole (PROTONIX) 40 MG tablet TAKE 1 TABLET BY MOUTH  DAILY  . potassium chloride SA (K-DUR,KLOR-CON) 20 MEQ tablet Take 1 tablet by mouth  daily  . Respiratory Therapy Supplies (FLUTTER) DEVI Use as directed  . SYMBICORT 160-4.5 MCG/ACT inhaler INHALE 2 PUFFS BY MOUTH FIRST THING IN THE MORNING AND THEN ANOTHER  2 PUFFS ABOUT 12 HOURS LATER  . Tiotropium Bromide Monohydrate (SPIRIVA RESPIMAT) 2.5 MCG/ACT AERS Inhale 2 puffs by mouth every morning  . [DISCONTINUED] budesonide-formoterol (SYMBICORT) 160-4.5 MCG/ACT inhaler Inhale 2 puffs into the lungs 2 (two) times daily.  . [DISCONTINUED] budesonide-formoterol (SYMBICORT) 160-4.5 MCG/ACT inhaler Inhale 2 puffs into the lungs 2 (two) times daily.  . [DISCONTINUED] methocarbamol (ROBAXIN) 500 MG tablet Take 1 tablet (500 mg total) by mouth 3 (three) times daily as needed.  . [DISCONTINUED] oxyCODONE-acetaminophen (PERCOCET/ROXICET) 5-325 MG tablet Take 2 tablets by mouth every 8 (eight) hours as needed for severe pain.  . [DISCONTINUED] predniSONE (DELTASONE) 10 MG tablet Take  4 each am x 2 days,   2 each am x 2 days,  1 each am x 2 days and stop            Objective:   Physical Exam  08/10/2013      352 > 350 08/25/2013 > 10/17/2013 351 > 01/15/2014  345 > 05/08/2014 345 > 08/06/2014  354 > 02/04/2015 354 > 02/25/2015  354 > 05/17/2015 351 > 05/27/2015  349 > 08/27/2015  335 > 02/24/2016   315 > 08/27/2016  323 > 03/29/2017  336    amb massively obese wm/ vital signs reviewed    - Note on arrival 02 sats 91% on RA    HEENT: nl dentition, turbinates, and oropharynx. Nl external ear canals without cough reflex   NECK :  without JVD/Nodes/TM/ nl carotid upstrokes bilaterally   LUNGS: no acc muscle use,    CV:  RRR  no  s3 or murmur or increase in P2,   4 + pitting edema with chronic venous stasis changes and marked erythema on L   ABD:  Massively obese soft and nontender with nl excursion in the supine position. No bruits or organomegaly, bowel sounds nl  MS:  warm without deformities, calf tenderness, cyanosis or clubbing  SKIN: warm and dry without lesions  / poor toe hygiene noted      I personally reviewed images and agree with radiology impression as follows:  abd CT  03/25/17 Lower chest: Nodule measures 5 mm at the posterior right lung base on image 3. Linear nodularity following the bronchi of the right lower lobe on image 4. Partial volume loss of right lower lobe, unchanged from comparison CT studies. Nodule at the posterior right lung base on image 1. No evidence of pleural effusion.      Assessment & Plan:

## 2017-03-29 NOTE — Patient Instructions (Signed)
You will need to take better care of your toe nails and toe webs going forward as they are the likely source of your infection   In meantime finish your antibiotics and elevate your legs as much as possible and follow up with Dr Kenton Kingfisher   We will contact you in 6 months for follow up CT and office visit same day

## 2017-03-30 ENCOUNTER — Encounter: Payer: Self-pay | Admitting: Internal Medicine

## 2017-03-30 DIAGNOSIS — R918 Other nonspecific abnormal finding of lung field: Secondary | ICD-10-CM | POA: Insufficient documentation

## 2017-03-30 NOTE — Assessment & Plan Note (Signed)
Bases only seen 03/25/17 on abd ct > set up for f/u 09/22/17  CT results reviewed with pt >>> Too small for PET or bx, not suspicious enough for excisional bx > really only option for now is follow the Fleischner society guidelines as rec by radiology.   Discussed in detail all the  indications, usual  risks and alternatives  relative to the benefits with patient who agrees to proceed with conservative f/u as outlined    I had an extended discussion with the patient reviewing all relevant studies completed to date and  lasting 15 to 20 minutes of a 25 minute visit    Each maintenance medication was reviewed in detail including most importantly the difference between maintenance and prns and under what circumstances the prns are to be triggered using an action plan format that is not reflected in the computer generated alphabetically organized AVS.    Please see AVS for specific instructions unique to this visit that I personally wrote and verbalized to the the pt in detail and then reviewed with pt  by my nurse highlighting any  changes in therapy recommended at today's visit to their plan of care.

## 2017-03-30 NOTE — Assessment & Plan Note (Signed)
-   started breo 05/02/13 > d/c 06/06/13 and replaced with symbicort   - PFT's 06/06/13  FEV1 1.59 (47%) ratio 62 and 13% better despite  breo am ov test, DLC0 82 corrects to 113% - med calendar    08/25/2013   - flutter valve added 02/04/15  - 02/25/2015  Walked RA x 3 laps @ 185 ft each stopped due to End of study, nl pace, no desat  / min sob/ some hip and leg pain  - referred to rehab 02/25/2015   - re-inforced flutter valve 05/27/2015  - 08/27/2016  After extensive coaching HFA effectiveness =    90%   Well compensated on research protocol, no changes needed

## 2017-03-30 NOTE — Assessment & Plan Note (Signed)
-   HCO3 33 02/04/15  - 02/25/2015 no desat's walking  - 08/27/2015  Walked RA x 3 laps @ 185 ft each stopped due to  End of study, nl pace, no sob or desat     rec as of 03/29/2017 02 2lpm with cpap only at bedtime and prn daytime

## 2017-03-30 NOTE — Assessment & Plan Note (Signed)
Body mass index is 46.9 kg/m.  -  trending up Lab Results  Component Value Date   TSH 0.82 02/04/2015     Contributing to gerd risk/ doe/reviewed the need and the process to achieve and maintain neg calorie balance > defer f/u primary care including intermittently monitoring thyroid status

## 2017-04-01 DIAGNOSIS — L03116 Cellulitis of left lower limb: Secondary | ICD-10-CM | POA: Diagnosis not present

## 2017-04-09 ENCOUNTER — Other Ambulatory Visit: Payer: Self-pay | Admitting: Family Medicine

## 2017-04-09 DIAGNOSIS — I10 Essential (primary) hypertension: Secondary | ICD-10-CM | POA: Diagnosis not present

## 2017-04-09 DIAGNOSIS — M79605 Pain in left leg: Secondary | ICD-10-CM

## 2017-04-09 DIAGNOSIS — L03116 Cellulitis of left lower limb: Secondary | ICD-10-CM | POA: Diagnosis not present

## 2017-04-09 DIAGNOSIS — M7989 Other specified soft tissue disorders: Secondary | ICD-10-CM | POA: Diagnosis not present

## 2017-04-09 DIAGNOSIS — R609 Edema, unspecified: Secondary | ICD-10-CM

## 2017-04-13 DIAGNOSIS — E291 Testicular hypofunction: Secondary | ICD-10-CM | POA: Diagnosis not present

## 2017-04-13 DIAGNOSIS — Z23 Encounter for immunization: Secondary | ICD-10-CM | POA: Diagnosis not present

## 2017-04-16 DIAGNOSIS — L909 Atrophic disorder of skin, unspecified: Secondary | ICD-10-CM | POA: Diagnosis not present

## 2017-05-04 DIAGNOSIS — E291 Testicular hypofunction: Secondary | ICD-10-CM | POA: Diagnosis not present

## 2017-05-13 DIAGNOSIS — E291 Testicular hypofunction: Secondary | ICD-10-CM | POA: Diagnosis not present

## 2017-05-19 DIAGNOSIS — H52229 Regular astigmatism, unspecified eye: Secondary | ICD-10-CM | POA: Diagnosis not present

## 2017-05-19 DIAGNOSIS — H524 Presbyopia: Secondary | ICD-10-CM | POA: Diagnosis not present

## 2017-05-19 DIAGNOSIS — H2513 Age-related nuclear cataract, bilateral: Secondary | ICD-10-CM | POA: Diagnosis not present

## 2017-05-19 DIAGNOSIS — H5200 Hypermetropia, unspecified eye: Secondary | ICD-10-CM | POA: Diagnosis not present

## 2017-05-19 DIAGNOSIS — H25042 Posterior subcapsular polar age-related cataract, left eye: Secondary | ICD-10-CM | POA: Diagnosis not present

## 2017-05-19 DIAGNOSIS — E119 Type 2 diabetes mellitus without complications: Secondary | ICD-10-CM | POA: Diagnosis not present

## 2017-05-27 DIAGNOSIS — K219 Gastro-esophageal reflux disease without esophagitis: Secondary | ICD-10-CM | POA: Diagnosis not present

## 2017-05-27 DIAGNOSIS — E1142 Type 2 diabetes mellitus with diabetic polyneuropathy: Secondary | ICD-10-CM | POA: Diagnosis not present

## 2017-05-27 DIAGNOSIS — Z1211 Encounter for screening for malignant neoplasm of colon: Secondary | ICD-10-CM | POA: Diagnosis not present

## 2017-05-27 DIAGNOSIS — J449 Chronic obstructive pulmonary disease, unspecified: Secondary | ICD-10-CM | POA: Diagnosis not present

## 2017-05-27 DIAGNOSIS — I872 Venous insufficiency (chronic) (peripheral): Secondary | ICD-10-CM | POA: Diagnosis not present

## 2017-05-27 DIAGNOSIS — Z1389 Encounter for screening for other disorder: Secondary | ICD-10-CM | POA: Diagnosis not present

## 2017-05-27 DIAGNOSIS — E7800 Pure hypercholesterolemia, unspecified: Secondary | ICD-10-CM | POA: Diagnosis not present

## 2017-05-27 DIAGNOSIS — E291 Testicular hypofunction: Secondary | ICD-10-CM | POA: Diagnosis not present

## 2017-05-27 DIAGNOSIS — G609 Hereditary and idiopathic neuropathy, unspecified: Secondary | ICD-10-CM | POA: Diagnosis not present

## 2017-05-27 DIAGNOSIS — N3281 Overactive bladder: Secondary | ICD-10-CM | POA: Diagnosis not present

## 2017-05-27 DIAGNOSIS — N183 Chronic kidney disease, stage 3 (moderate): Secondary | ICD-10-CM | POA: Diagnosis not present

## 2017-05-27 DIAGNOSIS — G4730 Sleep apnea, unspecified: Secondary | ICD-10-CM | POA: Diagnosis not present

## 2017-05-28 ENCOUNTER — Encounter: Payer: Self-pay | Admitting: Cardiology

## 2017-06-01 ENCOUNTER — Other Ambulatory Visit: Payer: Self-pay | Admitting: Internal Medicine

## 2017-06-01 ENCOUNTER — Other Ambulatory Visit: Payer: Self-pay | Admitting: Cardiology

## 2017-06-08 ENCOUNTER — Encounter: Payer: Self-pay | Admitting: Cardiology

## 2017-06-08 ENCOUNTER — Ambulatory Visit (INDEPENDENT_AMBULATORY_CARE_PROVIDER_SITE_OTHER): Payer: Medicare Other | Admitting: Cardiology

## 2017-06-08 VITALS — BP 162/76 | HR 75 | Ht 71.0 in | Wt 333.2 lb

## 2017-06-08 DIAGNOSIS — I2583 Coronary atherosclerosis due to lipid rich plaque: Secondary | ICD-10-CM

## 2017-06-08 DIAGNOSIS — Z1211 Encounter for screening for malignant neoplasm of colon: Secondary | ICD-10-CM | POA: Diagnosis not present

## 2017-06-08 DIAGNOSIS — I2510 Atherosclerotic heart disease of native coronary artery without angina pectoris: Secondary | ICD-10-CM | POA: Diagnosis not present

## 2017-06-08 DIAGNOSIS — G4733 Obstructive sleep apnea (adult) (pediatric): Secondary | ICD-10-CM | POA: Diagnosis not present

## 2017-06-08 DIAGNOSIS — E291 Testicular hypofunction: Secondary | ICD-10-CM | POA: Diagnosis not present

## 2017-06-08 NOTE — Progress Notes (Signed)
[facility]. [address], [address] [place], [place]  [address] Phone: [phone] Fax:  [phone]  Date:  06/08/2017   ID:  PRESTYN Watson, DOB 01-Jan-1946, MRN [id number]  PCP:  Shirline Frees, MD   History of Present Illness: Shane Watson is a 71 y.o. male with coronary artery disease with percutaneous intervention DES x2 to the mid and proximal LAD 12/18/10 here for followup.   Saw Dr. [name] for chronic bronchitis. Checked oxygen and prescribed for a while. Now only wears when exerts himself. OSA, CPAP.   Wears compression hose for edema.    One night  palpitations HR 150 ? 180 bpm sweating, HR on watch. also on pulse oximeter. Lasted approximately 30 minutes. He took nitroglycerin, this subsided. I prescribed her diltiazem to help with this if necessary. Very rare episodes.  09/2015-shoulder surgery, right. Did well. But still stiff. Dr. Veverly Fells.  No CP, no SOB. Apple watch.   05/2017 - cellulitis. On study for MDI.   Wt Readings from Last 3 Encounters:  06/08/17 (!) 333 lb 3.2 oz (151.1 kg)  03/29/17 (!) 336 lb 4 oz (152.5 kg)  08/27/16 (!) 323 lb 3.2 oz (146.6 kg)     Past Medical History:  Diagnosis Date  . Allergy   . Anemia   . Arthritis   . BPH (benign prostatic hyperplasia)   . CKD (chronic kidney disease), stage III ([place])   . Complication of anesthesia   . COPD (chronic obstructive pulmonary disease) ([place])    a. Uses Home O2 w/ exertion.  . Coronary artery disease    a. 11/2010 Cath: LM nl, LAD 16m (3.5x22 and 3.5x12 Resolute DES'), LCX nl, OM1/2/3 nl, RCA 20-30p, PDA nl.   . Diabetes mellitus without complication ([place])   . GERD (gastroesophageal reflux disease)   . H/O echocardiogram    a. per notes in 11/2010 - nl EF, basal inf wma.  . Heart murmur   . History of kidney stones   . Hyperlipidemia   . Hypertensive heart disease   . Hyperthyroidism   . Neuropathy   . Neuropathy   . OSA (obstructive sleep apnea)    a. Uses CPAP.  Marland Kitchen PONV  (postoperative nausea and vomiting)   . Vertigo     Past Surgical History:  Procedure Laterality Date  . 2 heart stents  12/18/10  . CARDIAC CATHETERIZATION    . CHOLECYSTECTOMY    . COLONOSCOPY    . REVERSE SHOULDER ARTHROPLASTY Right 10/08/2015  . REVERSE SHOULDER ARTHROPLASTY Right 10/08/2015   Procedure: REVERSE SHOULDER ARTHROPLASTY;  Surgeon: Netta Cedars, MD;  Location: [facility];  Service: Orthopedics;  Laterality: Right;  . right knee surgery Right 2008ish   "scraped it out"    Current Outpatient Medications  Medication Sig Dispense Refill  . acetaminophen (TYLENOL) 500 MG tablet Take 500 mg by mouth at bedtime.    Marland Kitchen albuterol (PROAIR HFA) 108 (90 Base) MCG/ACT inhaler 2 puffs every 4 hours as needed only  if your can't catch your breath 1 Inhaler 11  . albuterol (PROVENTIL) (5 MG/ML) 0.5% nebulizer solution Take 2.5 mg by nebulization every 4 (four) hours as needed for wheezing or shortness of breath (((PLAN B))).     Marland Kitchen aspirin EC 81 MG tablet Take 81 mg by mouth daily.    Marland Kitchen atorvastatin (LIPITOR) 40 MG tablet Take 1 tablet by mouth at  Bedtime. Please keep upcoming appt before anymore refills. Thanks 90 tablet 0  .  cetirizine (ZYRTEC) 10 MG tablet Take 10 mg by mouth every morning.     . colesevelam (WELCHOL) 625 MG tablet Take 1 tablet (625 mg total) by mouth daily with breakfast. 90 tablet 1  . Dextromethorphan-Guaifenesin (MUCINEX DM MAXIMUM STRENGTH) 60-1200 MG TB12 Take 1,200 mg by mouth 2 (two) times daily as needed (for congestion).     Marland Kitchen diltiazem (CARDIZEM) 60 MG tablet Take 60 mg by mouth 4 (four) times daily as needed (tachycardia).    Marland Kitchen doxazosin (CARDURA) 8 MG tablet Take 8 mg by mouth at bedtime.    . famotidine (PEPCID) 20 MG tablet TAKE 1 TABLET BY MOUTH AT  BEDTIME 90 tablet 0  . furosemide (LASIX) 20 MG tablet TAKE 2 TABLETS BY MOUTH  DAILY 180 tablet 0  . gabapentin (NEURONTIN) 100 MG capsule Take 300 mg by mouth 2 (two) times daily. 3 capsules by mouth twice  daily    . ipratropium (ATROVENT) 0.06 % nasal spray Place 2 sprays into both nostrils 4 (four) times daily.    Marland Kitchen JANUVIA 100 MG tablet Take 100 mg by mouth daily.    Marland Kitchen losartan (COZAAR) 50 MG tablet TAKE 1 TABLET BY MOUTH  DAILY 90 tablet 0  . meclizine (ANTIVERT) 12.5 MG tablet Take 12.5 mg by mouth 2 (two) times daily.    . Multiple Vitamin (MULTIVITAMIN WITH MINERALS) TABS tablet Take 1 tablet by mouth at bedtime.     . nitroGLYCERIN (NITROSTAT) 0.4 MG SL tablet Place 1 tablet (0.4 mg total) under the tongue every 5 (five) minutes as needed for chest pain. 25 tablet prn  . oxybutynin (DITROPAN) 5 MG tablet Take 5 mg by mouth daily as needed for bladder spasms.    . OXYGEN Inhale 2 L into the lungs at bedtime.    . pantoprazole (PROTONIX) 40 MG tablet TAKE 1 TABLET BY MOUTH  DAILY 90 tablet 0  . potassium chloride SA (K-DUR,KLOR-CON) 20 MEQ tablet Take 1 tablet by mouth  daily 90 tablet 2  . Respiratory Therapy Supplies (FLUTTER) DEVI Use as directed 1 each 0  . SYMBICORT 160-4.5 MCG/ACT inhaler INHALE 2 PUFFS BY MOUTH FIRST THING IN THE MORNING AND THEN ANOTHER 2 PUFFS ABOUT 12 HOURS LATER (Patient not taking: Reported on 06/08/2017) 30.6 g 0  . Tiotropium Bromide Monohydrate (SPIRIVA RESPIMAT) 2.5 MCG/ACT AERS Inhale 2 puffs by mouth every morning (Patient not taking: Reported on 06/08/2017) 3 Inhaler 3   No current facility-administered medications for this visit.    Facility-Administered Medications Ordered in Other Visits  Medication Dose Route Frequency Provider Last Rate Last Dose  . bupivacaine-epinephrine (MARCAINE W/ EPI) 0.5% -1:200000 injection    Anesthesia Intra-op Lorrene Reid, MD   25 mL at 10/07/15 1734    Allergies:    Allergies  Allergen Reactions  . Aspirin Swelling    Angioedema-must take EC coated form only  . Ibuprofen Swelling    Angioedema   . Penicillins Hives  . Sulfa Antibiotics Hives    Social History:  The patient  reports that he quit smoking about  30 years ago. His smoking use included cigarettes. He has a 22.50 pack-year smoking history. he has never used smokeless tobacco. He reports that he drinks alcohol. He reports that he does not use drugs.   ROS:  Please see the history of present illness.   PHYSICAL EXAM: VS:  BP (!) 162/76   Pulse 75   Ht 5\' 11"  (1.803 m)   Wt (!) 333 lb  3.2 oz (151.1 kg)   SpO2 91%   BMI 46.47 kg/m  Well nourished, well developed, in no acute distress  HEENT: normal thick neck  Neck: no JVD  Cardiac:  normal S1, S2; RRR; no murmur  Lungs:  clear to auscultation bilaterally, no wheezing, rhonchi or rales  Abd: soft, nontender, no hepatomegaly Morbidly obese Ext: 1+ edema, compression hose.  Limited range of motion of right shoulder. Skin: warm and dry Right arm sling Neuro: no focal abnormalities noted, mild tremor noted right hand.   EKG:   Today 06/08/17-sinus rhythm, PVC noted heart rate 75.  04/08/15-sinus rhythm, 85, no other abnormalities personally viewed-prior 10/11/13-normal sinus rhythm, 66, no other changes.    Labs: LDL 2013 was 54.  Dr. Kenton Kingfisher watching  ASSESSMENT AND PLAN:  1. Coronary artery disease-PCI to DES 2 to the LAD 11/2010. No chest pain, no angina. Stable no angina.  Doing well. 2. Hypertension-well controlled today. Continue current medications.  Monitor.  Overall stable. 3. Morbid obesity-strongly encourage weight loss.this will help with a multitude of his medical issues. Once again discussed.  Continue to encourage weight loss. 4. COPD - Dr. [name]. O2. Pulm rehab.  Currently in study. 5. OSA - CPAP with O2.  Uses peppermint oil 6. Hyperlipidemia - Lipitor, LDL goal 70. At goal previously.  LDL 38.  Excellent. 7. Chronic Kidney disease stage 3-creatinine 1.5.  No changes. 8. Palpitations-I prescribed to him diltiazem 60 mg short acting every 6 hours as needed. if this continues to occur, we will place an event monitor. Given his morbid obesity and other conditions, this could  be manifestation of atrial flutter or perhaps fibrillation.  He has an episode approximately once every 2-3 months.   9. Diabetes type 2-Tradjenta 10. 12 months with me  Signed, Candee Furbish, MD [facility]  06/08/2017 3:23 PM

## 2017-06-08 NOTE — Patient Instructions (Signed)

## 2017-06-09 ENCOUNTER — Other Ambulatory Visit: Payer: Self-pay | Admitting: Cardiology

## 2017-06-16 NOTE — Addendum Note (Signed)
Addended by: Stephannie Peters on: 06/16/2017 10:05 AM   Modules accepted: Orders

## 2017-06-29 DIAGNOSIS — E291 Testicular hypofunction: Secondary | ICD-10-CM | POA: Diagnosis not present

## 2017-07-21 DIAGNOSIS — E291 Testicular hypofunction: Secondary | ICD-10-CM | POA: Diagnosis not present

## 2017-07-28 ENCOUNTER — Encounter: Payer: Self-pay | Admitting: Internal Medicine

## 2017-07-28 ENCOUNTER — Ambulatory Visit (INDEPENDENT_AMBULATORY_CARE_PROVIDER_SITE_OTHER): Payer: Medicare Other | Admitting: Internal Medicine

## 2017-07-28 ENCOUNTER — Ambulatory Visit (INDEPENDENT_AMBULATORY_CARE_PROVIDER_SITE_OTHER)
Admission: RE | Admit: 2017-07-28 | Discharge: 2017-07-28 | Disposition: A | Discharge status: Home / Self Care | Payer: Medicare Other | Source: Ambulatory Visit | Attending: Internal Medicine | Admitting: Internal Medicine

## 2017-07-28 VITALS — BP 160/70 | HR 74 | Temp 98.3°F | Ht 71.0 in | Wt 331.0 lb

## 2017-07-28 DIAGNOSIS — R05 Cough: Secondary | ICD-10-CM | POA: Diagnosis not present

## 2017-07-28 DIAGNOSIS — J441 Chronic obstructive pulmonary disease with (acute) exacerbation: Secondary | ICD-10-CM | POA: Diagnosis not present

## 2017-07-28 DIAGNOSIS — J9612 Chronic respiratory failure with hypercapnia: Secondary | ICD-10-CM | POA: Diagnosis not present

## 2017-07-28 MED ORDER — AZITHROMYCIN 250 MG PO TABS: ORAL_TABLET | ORAL | 0 refills | Status: DC

## 2017-07-28 MED ORDER — PREDNISONE 10 MG PO TABS: ORAL_TABLET | ORAL | 0 refills | Status: DC

## 2017-07-28 NOTE — Patient Instructions (Addendum)
zpak  Prednisone 10 mg take  4 each am x 2 days,   2 each am x 2 days,  1 each am x 2 days and stop   For cough > mucinex dm up to 1200 mg every 12 hours and use flutter valve as much as you can  For short of breath/ wheeze >> Only use your albuterol as a rescue medication to be used if you can't catch your breath by resting or doing a relaxed purse lip breathing pattern.  - The less you use it, the better it will work when you need it. - Ok to use up to 2 puffs  every 4 hours if you must but call for immediate appointment if use goes up over your usual need - Don't leave home without it !!  (think of it like the spare tire for your car)    Please remember to go to the  x-ray department downstairs in the basement  for your tests - we will call you with the results when they are available.   Call me back if not all better by first of next week

## 2017-07-28 NOTE — Progress Notes (Signed)
Subjective:   Patient ID: Shane Watson, male    DOB: April 30, 1946   MRN: [id number]    Brief patient profile:  31  yowm  Quit smoking 1987 at wt = 240   Referred 05/02/2013 by [name] for low 02. Sees [name] for primary and on cpap per his office with chronic R HD elevation documented first in 2005 c/w anterior eventration with GOLD II criteria copd 05/2013     History of Present Illness  05/02/2013 1st  Pulmonary office visit/ [name] cc indolent onset progressive doe x sev years with w/u by [name] with stent seemed to help at that point and no change uses HC sticker to go shopping and leans on cart at HT due to back but  does do some hunting walking x sev hundred feet.  Uses alb seems to help some with assoc subj wheeze rec Breo one puff daily automatically to see if breathing improves Please see patient coordinator before you leave today  to schedule overnight 02 sat on cpap> rx 2lpm      02/04/2015 f/u ov/[name] re: Morbid obesity/ GOLD II on sym/spirva respimat no longer using med calendar/ action plan Chief Complaint  Patient presents with  . Follow-up    Pt c/o increased SOB and cough for the past wk "getting better" with taking zpack and pred.     Baseline sob did not change until 10 days  prior to OV  With cough / congestion with dark mucus > rx mucinex  Seen 02/02/15 rec change mucinex dm to mucinex and zpak prednisone 20 x 5 days  Did not use hfa before moving to neb saba as per prev action plan  rec Work on perfecting  inhaler technique      05/17/2015 acute extended ov/[name] re: aecopd /GOLD II/ MO Chief Complaint  Patient presents with  . Acute Visit    Pt c/o increased cough for the past wk- prod with grey in the am and light yellow throughout the day. Cough seems worse at night. He has also been wheezing and feeling more SOB.   At acute onset of cough x one week prior to OV  started taking mucinex and deslym, not mucinex dm but otherwise following  action plan on med calendar  cough prod thick yellow mucus  Comfortable at rest and has not needed neb saba yet rec Prednisone 10 mg take  4 each am x 2 days,   2 each am x 2 days,  1 each am x 2 days and stop  Doxycycline 100 mg Take 30  min before your first and last meals of the day with glass of water  See calendar for specific medication instructions and bring it back for each and every office visit    03/29/2017  f/u ov/[name] re: new dx of lung nodules/ on pharmquest protocol for copd Chief Complaint  Patient presents with  . Follow-up    Pt states Dr. Kenton Kingfisher found nodules in lungs. Patient states he has no symptoms.   first fever x 03/25/17 with shaking chills, last fever on 03/27/17 rx keflex for cellulitis improving  No cough and no change in doe rec You will need to take better care of your toe nails and toe webs going forward as they are the likely source of your infection  In meantime finish your antibiotics and elevate your legs as much as possible and follow up with Dr Kenton Kingfisher  We will contact you in 6 months for follow  up CT and office visit same day    07/28/2017 acute extended ov/[name] re: cough x Jan 6th / sore throat / worse p 4pm Chief Complaint  Patient presents with  . Acute Visit    Pt c/o cough with brown to yellow sputum, wheezing, increased SOB and chest tightness for the past 4 days. He has used his rescue inhaler once since onset of these symptoms. He has not used his neb. Still on study drug inhaler and off of symb and spiriva.   not sure what his rescue hfa is but gets it from study and has neb but has not used it  Was doing well until abrupt onset cough /chest congestion 4 d prior to OV   Cough esp prod in ams with assoc nasal congestion   No obvious other patterns in  day to day or daytime variability or assoc   mucus plugs or hemoptysis or cp or chest tightness,   or hb symptoms. No unusual exposure hx or h/o childhood pna/ asthma or knowledge of premature  birth.   . Also denies any obvious fluctuation of symptoms with weather or environmental changes or other aggravating or alleviating factors except as outlined above   Current Allergies, Complete Past Medical History, Past Surgical History, Family History, and Social History were reviewed in [employer] record.  ROS  The following are not active complaints unless bolded Hoarseness, sore throat, dysphagia, dental problems, itching, sneezing,  nasal congestion or discharge of excess mucus or purulent secretions, ear ache,   fever, chills, sweats, unintended wt loss or wt gain, classically pleuritic or exertional cp,  orthopnea pnd or leg swelling, presyncope, palpitations, abdominal pain, anorexia, nausea, vomiting, diarrhea  or change in bowel habits or change in bladder habits, change in stools or change in urine, dysuria, hematuria,  rash, arthralgias, visual complaints, headache, numbness, weakness or ataxia or problems with walking or coordination,  change in mood/affect or memory.        Current Meds  Medication Sig  . acetaminophen (TYLENOL) 500 MG tablet Take 500 mg by mouth at bedtime.  Marland Kitchen albuterol (PROAIR HFA) 108 (90 Base) MCG/ACT inhaler 2 puffs every 4 hours as needed only  if your can't catch your breath  . albuterol (PROVENTIL) (5 MG/ML) 0.5% nebulizer solution Take 2.5 mg by nebulization every 4 (four) hours as needed for wheezing or shortness of breath (((PLAN B))).   Marland Kitchen aspirin EC 81 MG tablet Take 81 mg by mouth daily.  Marland Kitchen atorvastatin (LIPITOR) 40 MG tablet Take 1 tablet by mouth at  Bedtime. Please keep upcoming appt before anymore refills. Thanks  . cetirizine (ZYRTEC) 10 MG tablet Take 10 mg by mouth every morning.   . Chlorpheniramine-Acetaminophen (CORICIDIN HBP COLD/FLU PO) Take by mouth as directed.  . colesevelam (WELCHOL) 625 MG tablet Take 1 tablet (625 mg total) by mouth daily with breakfast.  . Dextromethorphan-Guaifenesin (MUCINEX DM MAXIMUM  STRENGTH) 60-1200 MG TB12 Take 1,200 mg by mouth 2 (two) times daily as needed (for congestion).   Marland Kitchen diltiazem (CARDIZEM) 60 MG tablet Take 60 mg by mouth 4 (four) times daily as needed (tachycardia).  Marland Kitchen doxazosin (CARDURA) 8 MG tablet Take 8 mg by mouth at bedtime.  . famotidine (PEPCID) 20 MG tablet TAKE 1 TABLET BY MOUTH AT  BEDTIME  . furosemide (LASIX) 20 MG tablet TAKE 2 TABLETS BY MOUTH  DAILY  . gabapentin (NEURONTIN) 100 MG capsule Take 300 mg by mouth 2 (two) times daily. 3  capsules by mouth twice daily  . ipratropium (ATROVENT) 0.06 % nasal spray Place 2 sprays into both nostrils 4 (four) times daily.  Marland Kitchen JANUVIA 100 MG tablet Take 100 mg by mouth daily.  Marland Kitchen losartan (COZAAR) 50 MG tablet TAKE 1 TABLET BY MOUTH  DAILY  . meclizine (ANTIVERT) 12.5 MG tablet Take 12.5 mg by mouth 2 (two) times daily.  . Multiple Vitamin (MULTIVITAMIN WITH MINERALS) TABS tablet Take 1 tablet by mouth at bedtime.   . nitroGLYCERIN (NITROSTAT) 0.4 MG SL tablet Place 1 tablet (0.4 mg total) under the tongue every 5 (five) minutes as needed for chest pain.  Marland Kitchen oxybutynin (DITROPAN) 5 MG tablet Take 5 mg by mouth daily as needed for bladder spasms.  . OXYGEN Inhale 2 L into the lungs at bedtime.  . pantoprazole (PROTONIX) 40 MG tablet TAKE 1 TABLET BY MOUTH  DAILY  . potassium chloride SA (K-DUR,KLOR-CON) 20 MEQ tablet Take 1 tablet by mouth  daily  . Respiratory Therapy Supplies (FLUTTER) DEVI Use as directed  . UNABLE TO FIND Med Name: CPAP with 2lpm o2 with sleep  . UNABLE TO FIND Med Name: Study drug- inhaler 2 pufss bid              Objective:   Physical Exam  08/10/2013      352 > 350 08/25/2013 > 10/17/2013 351 > 01/15/2014  345 > 05/08/2014 345 > 08/06/2014  354 > 02/04/2015 354 > 02/25/2015  354 > 05/17/2015 351 > 05/27/2015  349 > 08/27/2015  335 > 02/24/2016   315 > 08/27/2016  323 > 03/29/2017  336 > 07/28/2017    331   amb obese mod hoarse wm nad  Vital signs reviewed - Note on arrival 02 sats  94% on RA      CV:  RRR  no s3 or murmur or increase in P2,   2 + pitting edema with chronic venous stasis changes and marked erythema on L    HEENT: nl dentition,   and oropharynx. Nl external ear canals without cough reflex - moderate bilateral non-specific turbinate edema     NECK :  without JVD/Nodes/TM/ nl carotid upstrokes bilaterally   LUNGS: no acc muscle use,  Nl contour chest which is clear to A and P bilaterally without cough on insp or exp maneuvers   CV:  RRR  no s3 or murmur or increase in P2, and  2+ pitting both lower ext d wearing elastic hose  ABD:  soft and nontender with nl inspiratory excursion in the supine position. No bruits or organomegaly appreciated, bowel sounds nl  MS:  Nl gait/ ext warm without deformities, calf tenderness, cyanosis or clubbing No obvious joint restrictions   SKIN: warm and dry without lesions    NEURO:  alert, approp, nl sensorium with  no motor or cerebellar deficits apparent.      CXR PA and Lateral:   07/28/2017 :    I personally reviewed images and agree with radiology impression as follows:   1. No radiographic evidence of acute cardiopulmonary disease. 2. Aortic atherosclerosis. :       Assessment & Plan:

## 2017-07-29 ENCOUNTER — Encounter: Payer: Self-pay | Admitting: Internal Medicine

## 2017-07-29 NOTE — Assessment & Plan Note (Signed)
-   HCO3 33 02/04/15  - 02/25/2015 no desat's walking  - 08/27/2015  Walked RA x 3 laps @ 185 ft each stopped due to  End of study, nl pace, no sob or desat     rec as of 07/29/2017 02 2lpm with cpap only at bedtime

## 2017-07-29 NOTE — Assessment & Plan Note (Signed)
Body mass index is 46.17 kg/m.  -  trending down slightly/ encouraged  Lab Results  Component Value Date   TSH 0.82 02/04/2015     Contributing to gerd risk/ doe/reviewed the need and the process to achieve and maintain neg calorie balance > defer f/u primary care including intermittently monitoring thyroid status

## 2017-07-29 NOTE — Assessment & Plan Note (Signed)
-   started breo 05/02/13 > d/c 06/06/13 and replaced with symbicort   - PFT's 06/06/13  FEV1 1.59 (47%) ratio 62 and 13% better despite  breo am ov test, DLC0 82 corrects to 113% - med calendar    08/25/2013   - flutter valve added 02/04/15  - 02/25/2015  Walked RA x 3 laps @ 185 ft each stopped due to End of study, nl pace, no desat  / min sob/ some hip and leg pain  - referred to rehab 02/25/2015   - re-inforced flutter valve 05/27/2015  - 08/27/2016  After extensive coaching HFA effectiveness =    90%  Acute flare in setting of uri mostly nasal symptoms with minimal AB component and likely viral and self managing well so far   rec Zpak/ pred x 6 days and f/u in 2 weeks   I had an extended discussion with the patient reviewing all relevant studies completed to date and  lasting 15 to 20 minutes of a 25 minute acute office  visit    Each maintenance medication was reviewed in detail including most importantly the difference between maintenance and prns and under what circumstances the prns are to be triggered using an action plan format that is not reflected in the computer generated alphabetically organized AVS.    Please see AVS for specific instructions unique to this visit that I personally wrote and verbalized to the the pt in detail and then reviewed with pt  by my nurse highlighting any  changes in therapy recommended at today's visit to their plan of care.

## 2017-07-29 NOTE — Progress Notes (Signed)
Spoke with pt and notified of results per Dr. [name]. Pt verbalized understanding and denied any questions. 

## 2017-08-04 ENCOUNTER — Other Ambulatory Visit: Payer: Self-pay | Admitting: Internal Medicine

## 2017-08-04 ENCOUNTER — Other Ambulatory Visit: Payer: Self-pay | Admitting: Cardiology

## 2017-08-10 DIAGNOSIS — E291 Testicular hypofunction: Secondary | ICD-10-CM | POA: Diagnosis not present

## 2017-08-23 ENCOUNTER — Other Ambulatory Visit: Payer: Self-pay | Admitting: Internal Medicine

## 2017-08-23 DIAGNOSIS — R918 Other nonspecific abnormal finding of lung field: Secondary | ICD-10-CM

## 2017-08-23 DIAGNOSIS — R911 Solitary pulmonary nodule: Secondary | ICD-10-CM

## 2017-08-31 DIAGNOSIS — E291 Testicular hypofunction: Secondary | ICD-10-CM | POA: Diagnosis not present

## 2017-09-21 DIAGNOSIS — E291 Testicular hypofunction: Secondary | ICD-10-CM | POA: Diagnosis not present

## 2017-09-22 ENCOUNTER — Ambulatory Visit (INDEPENDENT_AMBULATORY_CARE_PROVIDER_SITE_OTHER)
Admission: RE | Admit: 2017-09-22 | Discharge: 2017-09-22 | Disposition: A | Discharge status: Home / Self Care | Payer: Medicare Other | Source: Ambulatory Visit | Attending: Internal Medicine | Admitting: Internal Medicine

## 2017-09-22 DIAGNOSIS — R911 Solitary pulmonary nodule: Secondary | ICD-10-CM | POA: Diagnosis not present

## 2017-09-22 DIAGNOSIS — R918 Other nonspecific abnormal finding of lung field: Secondary | ICD-10-CM | POA: Diagnosis not present

## 2017-09-23 NOTE — Progress Notes (Signed)
Was able to talk to the patient regarding their results.  They verbalized an understanding of what was discussed. No further questions at this time.  CT was routed to Dr. Shirline Frees of [place] @ [facility]

## 2017-10-12 DIAGNOSIS — E291 Testicular hypofunction: Secondary | ICD-10-CM | POA: Diagnosis not present

## 2017-11-02 DIAGNOSIS — E291 Testicular hypofunction: Secondary | ICD-10-CM | POA: Diagnosis not present

## 2017-11-16 ENCOUNTER — Other Ambulatory Visit: Payer: Self-pay | Admitting: Internal Medicine

## 2017-11-23 DIAGNOSIS — E291 Testicular hypofunction: Secondary | ICD-10-CM | POA: Diagnosis not present

## 2017-12-07 DIAGNOSIS — H8110 Benign paroxysmal vertigo, unspecified ear: Secondary | ICD-10-CM | POA: Diagnosis not present

## 2017-12-07 DIAGNOSIS — E7800 Pure hypercholesterolemia, unspecified: Secondary | ICD-10-CM | POA: Diagnosis not present

## 2017-12-07 DIAGNOSIS — J449 Chronic obstructive pulmonary disease, unspecified: Secondary | ICD-10-CM | POA: Diagnosis not present

## 2017-12-07 DIAGNOSIS — I2510 Atherosclerotic heart disease of native coronary artery without angina pectoris: Secondary | ICD-10-CM | POA: Diagnosis not present

## 2017-12-07 DIAGNOSIS — E291 Testicular hypofunction: Secondary | ICD-10-CM | POA: Diagnosis not present

## 2017-12-07 DIAGNOSIS — Z125 Encounter for screening for malignant neoplasm of prostate: Secondary | ICD-10-CM | POA: Diagnosis not present

## 2017-12-07 DIAGNOSIS — E1142 Type 2 diabetes mellitus with diabetic polyneuropathy: Secondary | ICD-10-CM | POA: Diagnosis not present

## 2017-12-07 DIAGNOSIS — I10 Essential (primary) hypertension: Secondary | ICD-10-CM | POA: Diagnosis not present

## 2017-12-07 DIAGNOSIS — Z1159 Encounter for screening for other viral diseases: Secondary | ICD-10-CM | POA: Diagnosis not present

## 2017-12-07 DIAGNOSIS — Z0000 Encounter for general adult medical examination without abnormal findings: Secondary | ICD-10-CM | POA: Diagnosis not present

## 2017-12-07 DIAGNOSIS — K5792 Diverticulitis of intestine, part unspecified, without perforation or abscess without bleeding: Secondary | ICD-10-CM | POA: Diagnosis not present

## 2017-12-07 DIAGNOSIS — N183 Chronic kidney disease, stage 3 (moderate): Secondary | ICD-10-CM | POA: Diagnosis not present

## 2017-12-14 DIAGNOSIS — E291 Testicular hypofunction: Secondary | ICD-10-CM | POA: Diagnosis not present

## 2018-01-04 DIAGNOSIS — E291 Testicular hypofunction: Secondary | ICD-10-CM | POA: Diagnosis not present

## 2018-01-12 DIAGNOSIS — K591 Functional diarrhea: Secondary | ICD-10-CM | POA: Diagnosis not present

## 2018-01-12 DIAGNOSIS — R1031 Right lower quadrant pain: Secondary | ICD-10-CM | POA: Diagnosis not present

## 2018-01-14 ENCOUNTER — Other Ambulatory Visit: Payer: Self-pay | Admitting: Family Medicine

## 2018-01-14 DIAGNOSIS — R1031 Right lower quadrant pain: Secondary | ICD-10-CM

## 2018-01-17 ENCOUNTER — Ambulatory Visit
Admission: RE | Admit: 2018-01-17 | Discharge: 2018-01-17 | Disposition: A | Discharge status: Home / Self Care | Payer: Medicare Other | Source: Ambulatory Visit | Attending: Family Medicine | Admitting: Family Medicine

## 2018-01-17 DIAGNOSIS — R1031 Right lower quadrant pain: Secondary | ICD-10-CM

## 2018-01-17 DIAGNOSIS — N281 Cyst of kidney, acquired: Secondary | ICD-10-CM | POA: Diagnosis not present

## 2018-01-17 MED ORDER — IOPAMIDOL (ISOVUE-300) INJECTION 61%
125.0000 mL | Freq: Once | INTRAVENOUS | Status: AC | PRN
  Administered 2018-01-17: 125 mL via INTRAVENOUS

## 2018-01-25 DIAGNOSIS — E291 Testicular hypofunction: Secondary | ICD-10-CM | POA: Diagnosis not present

## 2018-02-04 DIAGNOSIS — L03012 Cellulitis of left finger: Secondary | ICD-10-CM | POA: Diagnosis not present

## 2018-02-04 DIAGNOSIS — I10 Essential (primary) hypertension: Secondary | ICD-10-CM | POA: Diagnosis not present

## 2018-02-04 DIAGNOSIS — E119 Type 2 diabetes mellitus without complications: Secondary | ICD-10-CM | POA: Diagnosis not present

## 2018-02-04 DIAGNOSIS — J449 Chronic obstructive pulmonary disease, unspecified: Secondary | ICD-10-CM | POA: Diagnosis not present

## 2018-02-15 DIAGNOSIS — E291 Testicular hypofunction: Secondary | ICD-10-CM | POA: Diagnosis not present

## 2018-02-18 ENCOUNTER — Telehealth: Payer: Self-pay | Admitting: Internal Medicine

## 2018-02-18 MED ORDER — BUDESONIDE-FORMOTEROL FUMARATE 160-4.5 MCG/ACT IN AERO: INHALATION_SPRAY | RESPIRATORY_TRACT | 0 refills | Status: DC

## 2018-02-18 MED ORDER — TIOTROPIUM BROMIDE MONOHYDRATE 2.5 MCG/ACT IN AERS: INHALATION_SPRAY | RESPIRATORY_TRACT | 0 refills | Status: DC

## 2018-02-18 NOTE — Telephone Encounter (Signed)
1 of each symbicort 160 and spiriva 2.5 up front for pick up

## 2018-02-23 ENCOUNTER — Telehealth: Payer: Self-pay | Admitting: Internal Medicine

## 2018-02-23 MED ORDER — TIOTROPIUM BROMIDE MONOHYDRATE 2.5 MCG/ACT IN AERS: INHALATION_SPRAY | RESPIRATORY_TRACT | 3 refills | Status: DC

## 2018-02-23 MED ORDER — BUDESONIDE-FORMOTEROL FUMARATE 160-4.5 MCG/ACT IN AERO: INHALATION_SPRAY | RESPIRATORY_TRACT | 3 refills | Status: DC

## 2018-02-23 NOTE — Telephone Encounter (Signed)
Refills sent to preferred pharmacy. Patient called an notified.

## 2018-02-24 ENCOUNTER — Other Ambulatory Visit: Payer: Self-pay | Admitting: Internal Medicine

## 2018-02-24 MED ORDER — BUDESONIDE-FORMOTEROL FUMARATE 160-4.5 MCG/ACT IN AERO: INHALATION_SPRAY | RESPIRATORY_TRACT | 3 refills | Status: DC

## 2018-02-24 MED ORDER — TIOTROPIUM BROMIDE MONOHYDRATE 2.5 MCG/ACT IN AERS: INHALATION_SPRAY | RESPIRATORY_TRACT | 3 refills | Status: DC

## 2018-02-28 ENCOUNTER — Telehealth: Payer: Self-pay | Admitting: Internal Medicine

## 2018-02-28 MED ORDER — TIOTROPIUM BROMIDE MONOHYDRATE 2.5 MCG/ACT IN AERS: 2.0000 | INHALATION_SPRAY | Freq: Every day | RESPIRATORY_TRACT | 0 refills | Status: DC

## 2018-02-28 NOTE — Telephone Encounter (Signed)
Pt requesting symbicort and spiriva samples until his mail order rx arrives.  No symbicort samples in office, but spiriva samples left up front for pickup.  Pt aware.  Nothing further needed.

## 2018-03-08 DIAGNOSIS — E291 Testicular hypofunction: Secondary | ICD-10-CM | POA: Diagnosis not present

## 2018-03-08 DIAGNOSIS — R972 Elevated prostate specific antigen [PSA]: Secondary | ICD-10-CM | POA: Diagnosis not present

## 2018-03-29 DIAGNOSIS — E291 Testicular hypofunction: Secondary | ICD-10-CM | POA: Diagnosis not present

## 2018-04-19 DIAGNOSIS — Z23 Encounter for immunization: Secondary | ICD-10-CM | POA: Diagnosis not present

## 2018-04-19 DIAGNOSIS — E291 Testicular hypofunction: Secondary | ICD-10-CM | POA: Diagnosis not present

## 2018-05-03 DIAGNOSIS — E119 Type 2 diabetes mellitus without complications: Secondary | ICD-10-CM | POA: Diagnosis not present

## 2018-05-03 DIAGNOSIS — G4733 Obstructive sleep apnea (adult) (pediatric): Secondary | ICD-10-CM | POA: Diagnosis not present

## 2018-05-03 DIAGNOSIS — H25043 Posterior subcapsular polar age-related cataract, bilateral: Secondary | ICD-10-CM | POA: Diagnosis not present

## 2018-05-03 DIAGNOSIS — H524 Presbyopia: Secondary | ICD-10-CM | POA: Diagnosis not present

## 2018-05-03 DIAGNOSIS — H2513 Age-related nuclear cataract, bilateral: Secondary | ICD-10-CM | POA: Diagnosis not present

## 2018-05-03 DIAGNOSIS — K591 Functional diarrhea: Secondary | ICD-10-CM | POA: Diagnosis not present

## 2018-05-03 DIAGNOSIS — H5203 Hypermetropia, bilateral: Secondary | ICD-10-CM | POA: Diagnosis not present

## 2018-05-03 DIAGNOSIS — H52223 Regular astigmatism, bilateral: Secondary | ICD-10-CM | POA: Diagnosis not present

## 2018-05-06 DIAGNOSIS — Z01818 Encounter for other preprocedural examination: Secondary | ICD-10-CM | POA: Diagnosis not present

## 2018-05-06 DIAGNOSIS — H02831 Dermatochalasis of right upper eyelid: Secondary | ICD-10-CM | POA: Diagnosis not present

## 2018-05-06 DIAGNOSIS — E119 Type 2 diabetes mellitus without complications: Secondary | ICD-10-CM | POA: Diagnosis not present

## 2018-05-06 DIAGNOSIS — H25812 Combined forms of age-related cataract, left eye: Secondary | ICD-10-CM | POA: Diagnosis not present

## 2018-05-06 DIAGNOSIS — H2511 Age-related nuclear cataract, right eye: Secondary | ICD-10-CM | POA: Diagnosis not present

## 2018-05-10 DIAGNOSIS — E291 Testicular hypofunction: Secondary | ICD-10-CM | POA: Diagnosis not present

## 2018-05-31 ENCOUNTER — Other Ambulatory Visit: Payer: Self-pay | Admitting: Cardiology

## 2018-05-31 ENCOUNTER — Other Ambulatory Visit: Payer: Self-pay | Admitting: Internal Medicine

## 2018-05-31 DIAGNOSIS — E291 Testicular hypofunction: Secondary | ICD-10-CM | POA: Diagnosis not present

## 2018-06-09 ENCOUNTER — Encounter: Payer: Self-pay | Admitting: Cardiology

## 2018-06-09 ENCOUNTER — Ambulatory Visit (INDEPENDENT_AMBULATORY_CARE_PROVIDER_SITE_OTHER): Payer: Medicare Other | Admitting: Cardiology

## 2018-06-09 VITALS — BP 160/80 | HR 73 | Ht 71.0 in | Wt 335.1 lb

## 2018-06-09 DIAGNOSIS — G4733 Obstructive sleep apnea (adult) (pediatric): Secondary | ICD-10-CM | POA: Diagnosis not present

## 2018-06-09 DIAGNOSIS — I2510 Atherosclerotic heart disease of native coronary artery without angina pectoris: Secondary | ICD-10-CM

## 2018-06-09 DIAGNOSIS — I2583 Coronary atherosclerosis due to lipid rich plaque: Secondary | ICD-10-CM | POA: Diagnosis not present

## 2018-06-09 NOTE — Patient Instructions (Signed)
Medication Instructions:  The current medical regimen is effective;  continue present plan and medications.  If you need a refill on your cardiac medications before your next appointment, please call your pharmacy.   Follow-Up: At [facility], you and your health needs are our priority.  As part of our continuing mission to provide you with exceptional heart care, we have created designated Provider Care Teams.  These Care Teams include your primary Cardiologist (physician) and Advanced Practice Providers (APPs -  Physician Assistants and Nurse Practitioners) who all work together to provide you with the care you need, when you need it. You will need a follow up appointment in 12 months.  Please call our office 2 months in advance to schedule this appointment.  You may see Mark Skains, MD or one of the following Advanced Practice Providers on your designated Care Team:   Lori Gerhardt, NP Laura Ingold, NP . Jill McDaniel, NP  Thank you for choosing [facility]!!      

## 2018-06-09 NOTE — Progress Notes (Signed)
[facility]. [address], [address], [place]  [address] Phone: [phone] Fax:  [phone]  Date:  06/09/2018   ID:  Shane Watson, DOB 1946/05/04, MRN [id number]  PCP:  Shirline Frees, MD   History of Present Illness: Shane Watson is a 72 y.o. male with coronary artery disease with percutaneous intervention DES x2 to the mid and proximal LAD 12/18/10 here for followup.   Saw Dr. [name] for chronic bronchitis. Checked oxygen and prescribed for a while. Now only wears when exerts himself. OSA, CPAP.   Wears compression hose for edema.    One night  palpitations HR 150 ? 180 bpm sweating, HR on watch. also on pulse oximeter. Lasted approximately 30 minutes. He took nitroglycerin, this subsided. I prescribed her diltiazem to help with this if necessary. Very rare episodes.  09/2015-shoulder surgery, right. Did well. But still stiff. Dr. Veverly Fells.  No CP, no SOB. Apple watch.   05/2017 - cellulitis. On study for MDI.   06/09/18 - heart race a bit, sweat, feel funny, lasts a few hours, diltiazem helps.   Wt Readings from Last 3 Encounters:  06/09/18 (!) 335 lb 1.9 oz (152 kg)  07/28/17 (!) 331 lb (150.1 kg)  06/08/17 (!) 333 lb 3.2 oz (151.1 kg)     Past Medical History:  Diagnosis Date  . Allergy   . Anemia   . Arthritis   . BPH (benign prostatic hyperplasia)   . CKD (chronic kidney disease), stage III ([place])   . Complication of anesthesia   . COPD (chronic obstructive pulmonary disease) ([place])    a. Uses Home O2 w/ exertion.  . Coronary artery disease    a. 11/2010 Cath: LM nl, LAD 60m (3.5x22 and 3.5x12 Resolute DES'), LCX nl, OM1/2/3 nl, RCA 20-30p, PDA nl.   . Diabetes mellitus without complication ([place])   . GERD (gastroesophageal reflux disease)   . H/O echocardiogram    a. per notes in 11/2010 - nl EF, basal inf wma.  . Heart murmur   . History of kidney stones   . Hyperlipidemia   . Hypertensive heart disease   . Hyperthyroidism   . Neuropathy   .  Neuropathy   . OSA (obstructive sleep apnea)    a. Uses CPAP.  Marland Kitchen PONV (postoperative nausea and vomiting)   . Vertigo     Past Surgical History:  Procedure Laterality Date  . 2 heart stents  12/18/10  . CARDIAC CATHETERIZATION    . CHOLECYSTECTOMY    . COLONOSCOPY    . REVERSE SHOULDER ARTHROPLASTY Right 10/08/2015  . REVERSE SHOULDER ARTHROPLASTY Right 10/08/2015   Procedure: REVERSE SHOULDER ARTHROPLASTY;  Surgeon: Netta Cedars, MD;  Location: [facility];  Service: Orthopedics;  Laterality: Right;  . right knee surgery Right 2008ish   "scraped it out"    Current Outpatient Medications  Medication Sig Dispense Refill  . acetaminophen (TYLENOL) 500 MG tablet Take 500 mg by mouth at bedtime.    Marland Kitchen albuterol (PROAIR HFA) 108 (90 Base) MCG/ACT inhaler 2 puffs every 4 hours as needed only  if your can't catch your breath 1 Inhaler 11  . albuterol (PROVENTIL) (5 MG/ML) 0.5% nebulizer solution Take 2.5 mg by nebulization every 4 (four) hours as needed for wheezing or shortness of breath (((PLAN B))).     Marland Kitchen aspirin EC 81 MG tablet Take 81 mg by mouth daily.    Marland Kitchen atorvastatin (LIPITOR) 40 MG tablet TAKE 1 TABLET BY MOUTH  AT  BEDTIME 90 tablet 3  . budesonide-formoterol (SYMBICORT) 160-4.5 MCG/ACT inhaler INHALE 2 PUFFS BY MOUTH FIRST THING IN THE MORNING AND THEN ANOTHER 2 PUFFS ABOUT 12 HOURS LATER 3 Inhaler 3  . cetirizine (ZYRTEC) 10 MG tablet Take 10 mg by mouth every morning.     . colesevelam (WELCHOL) 625 MG tablet Take 625 mg by mouth 2 (two) times daily.    Marland Kitchen diltiazem (CARDIZEM) 60 MG tablet Take 60 mg by mouth 4 (four) times daily as needed (tachycardia).    Marland Kitchen doxazosin (CARDURA) 8 MG tablet Take 8 mg by mouth at bedtime.    . famotidine (PEPCID) 20 MG tablet TAKE 1 TABLET BY MOUTH AT  BEDTIME 90 tablet 0  . furosemide (LASIX) 20 MG tablet Take 2 tablets (40 mg total) by mouth daily. Please keep upcoming appt in November for future refills. Thank you 180 tablet 0  . gabapentin  (NEURONTIN) 100 MG capsule Take 300 mg by mouth 2 (two) times daily. 3 capsules by mouth twice daily    . ipratropium (ATROVENT) 0.06 % nasal spray Place 2 sprays into both nostrils 4 (four) times daily.    Marland Kitchen JANUVIA 100 MG tablet Take 100 mg by mouth daily.    Marland Kitchen losartan (COZAAR) 50 MG tablet Take 1 tablet (50 mg total) by mouth daily. Please keep upcoming appt in November for future refills. Thank you 90 tablet 0  . meclizine (ANTIVERT) 12.5 MG tablet Take 12.5 mg by mouth 2 (two) times daily.    . Multiple Vitamin (MULTIVITAMIN WITH MINERALS) TABS tablet Take 1 tablet by mouth at bedtime.     . nitroGLYCERIN (NITROSTAT) 0.4 MG SL tablet Place 1 tablet (0.4 mg total) under the tongue every 5 (five) minutes as needed for chest pain. 25 tablet prn  . oxybutynin (DITROPAN) 5 MG tablet Take 5 mg by mouth daily as needed for bladder spasms.    . OXYGEN Inhale 2 L into the lungs at bedtime.    . pantoprazole (PROTONIX) 40 MG tablet TAKE 1 TABLET BY MOUTH  DAILY 90 tablet 0  . potassium chloride SA (K-DUR,KLOR-CON) 20 MEQ tablet Take 1 tablet by mouth  daily 90 tablet 2  . Respiratory Therapy Supplies (FLUTTER) DEVI Use as directed 1 each 0  . Tiotropium Bromide Monohydrate (SPIRIVA RESPIMAT) 2.5 MCG/ACT AERS Inhale 2 puffs by mouth every morning 3 Inhaler 3  . UNABLE TO FIND Med Name: CPAP with 2lpm o2 with sleep    . UNABLE TO FIND Med Name: Study drug- inhaler 2 pufss bid     No current facility-administered medications for this visit.    Facility-Administered Medications Ordered in Other Visits  Medication Dose Route Frequency Provider Last Rate Last Dose  . bupivacaine-epinephrine (MARCAINE W/ EPI) 0.5% -1:200000 injection    Anesthesia Intra-op Lorrene Reid, MD   25 mL at 10/07/15 1734    Allergies:    Allergies  Allergen Reactions  . Aspirin Swelling    Angioedema-must take EC coated form only  . Ibuprofen Swelling    Angioedema   . Penicillins Hives  . Sulfa Antibiotics Hives     Social History:  The patient  reports that he quit smoking about 31 years ago. His smoking use included cigarettes. He has a 22.50 pack-year smoking history. He has never used smokeless tobacco. He reports that he drinks alcohol. He reports that he does not use drugs.   ROS:  Please see the history of present illness.  PHYSICAL EXAM: VS:  BP (!) 160/80   Pulse 73   Ht 5\' 11"  (1.803 m)   Wt (!) 335 lb 1.9 oz (152 kg)   BMI 46.74 kg/m  GEN: Well nourished, well developed, in no acute distress, obese HEENT: normal  Neck: no JVD, carotid bruits, or masses Cardiac: RRR; no murmurs, rubs, or gallops, chronic lower extremity edema with compression hose in place. Respiratory:  clear to auscultation bilaterally, normal work of breathing GI: soft, nontender, nondistended, + BS MS: no deformity or atrophy  Skin: warm and dry, no rash Neuro:  Alert and Oriented x 3, Strength and sensation are intact Psych: euthymic mood, full affect   EKG:   Today 06/09/2018-sinus rhythm 73 nonspecific ST-T wave changes personally reviewed no significant change from prior.  06/08/17-sinus rhythm, PVC noted heart rate 75.  04/08/15-sinus rhythm, 85, no other abnormalities personally viewed-prior 10/11/13-normal sinus rhythm, 66, no other changes.    Labs: LDL 36 2019.  Excellent.  ASSESSMENT AND PLAN:  1. Coronary artery disease-PCI to DES 2 to the LAD 11/2010. No chest pain, no angina. Stable no angina.  Doing well.  No anginal symptoms. 2. Hypertension-well controlled today. Continue current medications.  Monitor.  Overall stable.  A little bit elevated today.  Normally reasonable at home. 3. Morbid obesity-strongly encourage weight loss.this will help with a multitude of his medical issues. Once again discussed.  Continue to encourage weight loss.  Try to decrease carbohydrates. 4. COPD - Dr. [name]. O2. Pulm rehab.  Currently in study.  No changes. 5. OSA - CPAP with O2.  Uses peppermint oil, showed me his  iPhone application for this..  No change. 6. Hyperlipidemia -excellent with atorvastatin.  LDL 36. 7. Chronic Kidney disease stage 3-creatinine 1.5.  No changes. 8. Palpitations-I prescribed to him diltiazem 60 mg short acting every 6 hours as needed. if this continues to occur, we will place an event monitor. Given his morbid obesity and other conditions, this could be manifestation of atrial flutter or perhaps fibrillation.  He has an episode approximately once every 2-3 months.  He still feels this.  We discussed.  Diltiazem seems to help.  I want to make sure that this is not atrial fibrillation.  He does not wish to wear a monitor at this time because it is so rare.  Make sense. 9. Diabetes type 2-[place] hemoglobin A1c 6.  Excellent. 10. 12 months with me  Signed, Candee Furbish, MD [facility]  06/09/2018 10:20 AM

## 2018-06-10 ENCOUNTER — Encounter: Payer: Self-pay | Admitting: Cardiology

## 2018-06-10 DIAGNOSIS — E119 Type 2 diabetes mellitus without complications: Secondary | ICD-10-CM | POA: Diagnosis not present

## 2018-06-15 DIAGNOSIS — K219 Gastro-esophageal reflux disease without esophagitis: Secondary | ICD-10-CM | POA: Diagnosis not present

## 2018-06-15 DIAGNOSIS — N183 Chronic kidney disease, stage 3 (moderate): Secondary | ICD-10-CM | POA: Diagnosis not present

## 2018-06-15 DIAGNOSIS — J449 Chronic obstructive pulmonary disease, unspecified: Secondary | ICD-10-CM | POA: Diagnosis not present

## 2018-06-15 DIAGNOSIS — E7800 Pure hypercholesterolemia, unspecified: Secondary | ICD-10-CM | POA: Diagnosis not present

## 2018-06-15 DIAGNOSIS — I2510 Atherosclerotic heart disease of native coronary artery without angina pectoris: Secondary | ICD-10-CM | POA: Diagnosis not present

## 2018-06-15 DIAGNOSIS — Z1211 Encounter for screening for malignant neoplasm of colon: Secondary | ICD-10-CM | POA: Diagnosis not present

## 2018-06-15 DIAGNOSIS — H6122 Impacted cerumen, left ear: Secondary | ICD-10-CM | POA: Diagnosis not present

## 2018-06-15 DIAGNOSIS — E1165 Type 2 diabetes mellitus with hyperglycemia: Secondary | ICD-10-CM | POA: Diagnosis not present

## 2018-06-15 DIAGNOSIS — N400 Enlarged prostate without lower urinary tract symptoms: Secondary | ICD-10-CM | POA: Diagnosis not present

## 2018-06-15 DIAGNOSIS — N3281 Overactive bladder: Secondary | ICD-10-CM | POA: Diagnosis not present

## 2018-06-15 DIAGNOSIS — I10 Essential (primary) hypertension: Secondary | ICD-10-CM | POA: Diagnosis not present

## 2018-06-15 DIAGNOSIS — E291 Testicular hypofunction: Secondary | ICD-10-CM | POA: Diagnosis not present

## 2018-06-21 DIAGNOSIS — E291 Testicular hypofunction: Secondary | ICD-10-CM | POA: Diagnosis not present

## 2018-06-23 DIAGNOSIS — H2512 Age-related nuclear cataract, left eye: Secondary | ICD-10-CM | POA: Diagnosis not present

## 2018-06-23 DIAGNOSIS — H524 Presbyopia: Secondary | ICD-10-CM | POA: Diagnosis not present

## 2018-06-23 DIAGNOSIS — H25812 Combined forms of age-related cataract, left eye: Secondary | ICD-10-CM | POA: Diagnosis not present

## 2018-06-23 DIAGNOSIS — H5213 Myopia, bilateral: Secondary | ICD-10-CM | POA: Diagnosis not present

## 2018-06-23 DIAGNOSIS — Z9842 Cataract extraction status, left eye: Secondary | ICD-10-CM | POA: Diagnosis not present

## 2018-06-23 DIAGNOSIS — Z961 Presence of intraocular lens: Secondary | ICD-10-CM | POA: Diagnosis not present

## 2018-06-23 DIAGNOSIS — H52223 Regular astigmatism, bilateral: Secondary | ICD-10-CM | POA: Diagnosis not present

## 2018-07-19 DIAGNOSIS — E291 Testicular hypofunction: Secondary | ICD-10-CM | POA: Diagnosis not present

## 2018-07-21 ENCOUNTER — Other Ambulatory Visit: Payer: Self-pay | Admitting: Cardiology

## 2018-07-22 DIAGNOSIS — H25811 Combined forms of age-related cataract, right eye: Secondary | ICD-10-CM | POA: Diagnosis not present

## 2018-07-22 DIAGNOSIS — H524 Presbyopia: Secondary | ICD-10-CM | POA: Diagnosis not present

## 2018-07-22 DIAGNOSIS — H2512 Age-related nuclear cataract, left eye: Secondary | ICD-10-CM | POA: Diagnosis not present

## 2018-07-22 DIAGNOSIS — Z961 Presence of intraocular lens: Secondary | ICD-10-CM | POA: Diagnosis not present

## 2018-07-22 DIAGNOSIS — H5213 Myopia, bilateral: Secondary | ICD-10-CM | POA: Diagnosis not present

## 2018-07-22 DIAGNOSIS — H52223 Regular astigmatism, bilateral: Secondary | ICD-10-CM | POA: Diagnosis not present

## 2018-07-22 DIAGNOSIS — Z9849 Cataract extraction status, unspecified eye: Secondary | ICD-10-CM | POA: Diagnosis not present

## 2018-07-22 DIAGNOSIS — H2511 Age-related nuclear cataract, right eye: Secondary | ICD-10-CM | POA: Diagnosis not present

## 2018-07-26 ENCOUNTER — Encounter: Payer: Self-pay | Admitting: Nurse Practitioner

## 2018-07-26 ENCOUNTER — Ambulatory Visit (INDEPENDENT_AMBULATORY_CARE_PROVIDER_SITE_OTHER): Payer: Medicare Other | Admitting: Nurse Practitioner

## 2018-07-26 VITALS — BP 150/68 | HR 64 | Ht 71.0 in | Wt 339.6 lb

## 2018-07-26 DIAGNOSIS — J440 Chronic obstructive pulmonary disease with acute lower respiratory infection: Secondary | ICD-10-CM

## 2018-07-26 DIAGNOSIS — R062 Wheezing: Secondary | ICD-10-CM | POA: Diagnosis not present

## 2018-07-26 DIAGNOSIS — J209 Acute bronchitis, unspecified: Secondary | ICD-10-CM | POA: Diagnosis not present

## 2018-07-26 MED ORDER — ALBUTEROL SULFATE HFA 108 (90 BASE) MCG/ACT IN AERS: INHALATION_SPRAY | RESPIRATORY_TRACT | 11 refills | Status: AC

## 2018-07-26 MED ORDER — LEVALBUTEROL HCL 0.63 MG/3ML IN NEBU
0.6300 mg | INHALATION_SOLUTION | Freq: Once | RESPIRATORY_TRACT | Status: AC
  Administered 2018-07-26: 0.63 mg via RESPIRATORY_TRACT

## 2018-07-26 MED ORDER — PREDNISONE 10 MG PO TABS: ORAL_TABLET | ORAL | 0 refills | Status: DC

## 2018-07-26 MED ORDER — DOXYCYCLINE HYCLATE 100 MG PO TABS: 100.0000 mg | ORAL_TABLET | Freq: Two times a day (BID) | ORAL | 0 refills | Status: DC

## 2018-07-26 MED ORDER — FAMOTIDINE 20 MG PO TABS: 20.0000 mg | ORAL_TABLET | Freq: Every day | ORAL | 0 refills | Status: DC

## 2018-07-26 NOTE — Patient Instructions (Signed)
Will order doxycycline Will order prednisone taper Continue mucinex May take delsym xopenex neb given in office today Follow up with Dr. [name] at his first available appointment or sooner if symptoms worsen

## 2018-07-26 NOTE — Telephone Encounter (Signed)
I called pt to schedule an appt to see Shane Watson today 07/26/2017 at 11:30. Will close encounter.

## 2018-07-26 NOTE — Progress Notes (Signed)
@Patient  ID: Shane Watson, male    DOB: 1945-08-07, 73 y.o.   MRN: [id number]  Chief Complaint  Patient presents with  . Cough    with yellow congestion    Referring provider: Shirline Frees, MD  HPI  73 year old male former smoker (quit 1987) with COPD GOLD II, OSA, and chronic respiratory failure with hypercapnia followed by Dr. [name].  Tests/events: - HCO3 33 02/04/15  - 02/25/2015 no desat's walking  - 08/27/2015  Walked RA x 3 laps @ 185 ft each stopped due to  End of study, nl pace, no sob or desat    - rec as of 07/29/2017 02 2lpm with cpap only at bedtime  - started breo 05/02/13 > d/c 06/06/13 and replaced with symbicort   - PFT's 06/06/13  FEV1 1.59 (47%) ratio 62 and 13% better despite  breo am ov test, DLC0 82 corrects to 113% - med calendar    08/25/2013   - flutter valve added 02/04/15  - 02/25/2015  Walked RA x 3 laps @ 185 ft each stopped due to End of study, nl pace, no desat  / min sob/ some hip and leg pain  - referred to rehab 02/25/2015   - re-inforced flutter valve 05/27/2015  - 08/27/2016  After extensive coaching HFA effectiveness =90%   OV 07/27/18 -acute cough and chest congestion Presents today with cough and chest congestion that started on 07/23/2017.  States that symptoms are progressively worsening.  States that his cough is productive of yellow sputum.  He has been taking Mucinex with minimal relief noted.  He denies any sinus congestion.  He states that his cough is worse at night.  He denies any recent fever, shortness of breath, chest pain, or edema.    Allergies  Allergen Reactions  . Aspirin Swelling    Angioedema-must take EC coated form only  . Ibuprofen Swelling    Angioedema   . Penicillins Hives  . Sulfa Antibiotics Hives    Immunization History  Administered Date(s) Administered  . Influenza Split 04/05/2013, 04/19/2014, 05/06/2015  . Influenza-Unspecified 04/19/2017  . Pneumococcal Polysaccharide-23 07/20/2009  . Zoster 07/20/2009  .  Zoster Recombinat (Shingrix) 04/25/2018    Past Medical History:  Diagnosis Date  . Allergy   . Anemia   . Arthritis   . BPH (benign prostatic hyperplasia)   . CKD (chronic kidney disease), stage III ([place])   . Complication of anesthesia   . COPD (chronic obstructive pulmonary disease) ([place])    a. Uses Home O2 w/ exertion.  . Coronary artery disease    a. 11/2010 Cath: LM nl, LAD 63m (3.5x22 and 3.5x12 Resolute DES'), LCX nl, OM1/2/3 nl, RCA 20-30p, PDA nl.   . Diabetes mellitus without complication ([place])   . GERD (gastroesophageal reflux disease)   . H/O echocardiogram    a. per notes in 11/2010 - nl EF, basal inf wma.  . Heart murmur   . History of kidney stones   . Hyperlipidemia   . Hypertensive heart disease   . Hyperthyroidism   . Neuropathy   . Neuropathy   . OSA (obstructive sleep apnea)    a. Uses CPAP.  Marland Kitchen PONV (postoperative nausea and vomiting)   . Vertigo     Tobacco History: Social History   Tobacco Use  Smoking Status Former Smoker  . Packs/day: 1.50  . Years: 15.00  . Pack years: 22.50  . Types: Cigarettes  . Last attempt to quit: 04/29/1987  . Years since  quitting: 31.2  Smokeless Tobacco Never Used   Counseling given: Yes   Outpatient Encounter Medications as of 07/26/2018  Medication Sig  . acetaminophen (TYLENOL) 500 MG tablet Take 500 mg by mouth at bedtime.  Marland Kitchen albuterol (PROAIR HFA) 108 (90 Base) MCG/ACT inhaler 2 puffs every 4 hours as needed only  if your can't catch your breath  . albuterol (PROVENTIL) (5 MG/ML) 0.5% nebulizer solution Take 2.5 mg by nebulization every 4 (four) hours as needed for wheezing or shortness of breath (((PLAN B))).   Marland Kitchen aspirin EC 81 MG tablet Take 81 mg by mouth daily.  Marland Kitchen atorvastatin (LIPITOR) 40 MG tablet TAKE 1 TABLET BY MOUTH AT  BEDTIME  . budesonide-formoterol (SYMBICORT) 160-4.5 MCG/ACT inhaler INHALE 2 PUFFS BY MOUTH FIRST THING IN THE MORNING AND THEN ANOTHER 2 PUFFS ABOUT 12 HOURS LATER  . cetirizine  (ZYRTEC) 10 MG tablet Take 10 mg by mouth every morning.   . colesevelam (WELCHOL) 625 MG tablet Take 625 mg by mouth 2 (two) times daily.  Marland Kitchen diltiazem (CARDIZEM) 60 MG tablet Take 60 mg by mouth 4 (four) times daily as needed (tachycardia).  Marland Kitchen doxazosin (CARDURA) 8 MG tablet Take 8 mg by mouth at bedtime.  . famotidine (PEPCID) 20 MG tablet Take 1 tablet (20 mg total) by mouth at bedtime.  . furosemide (LASIX) 20 MG tablet Take 2 tablets (40 mg total) by mouth daily. Please keep upcoming appt in November for future refills. Thank you  . gabapentin (NEURONTIN) 100 MG capsule Take 300 mg by mouth 2 (two) times daily. 3 capsules by mouth twice daily  . ipratropium (ATROVENT) 0.06 % nasal spray Place 2 sprays into both nostrils 4 (four) times daily.  Marland Kitchen JANUVIA 100 MG tablet Take 100 mg by mouth daily.  Marland Kitchen losartan (COZAAR) 50 MG tablet Take 1 tablet (50 mg total) by mouth daily. Please keep upcoming appt in November for future refills. Thank you  . meclizine (ANTIVERT) 12.5 MG tablet Take 12.5 mg by mouth 2 (two) times daily.  . Multiple Vitamin (MULTIVITAMIN WITH MINERALS) TABS tablet Take 1 tablet by mouth at bedtime.   . nitroGLYCERIN (NITROSTAT) 0.4 MG SL tablet Place 1 tablet (0.4 mg total) under the tongue every 5 (five) minutes as needed for chest pain.  Marland Kitchen oxybutynin (DITROPAN) 5 MG tablet Take 5 mg by mouth daily as needed for bladder spasms.  . OXYGEN Inhale 2 L into the lungs at bedtime.  . pantoprazole (PROTONIX) 40 MG tablet TAKE 1 TABLET BY MOUTH  DAILY  . potassium chloride SA (K-DUR,KLOR-CON) 20 MEQ tablet Take 1 tablet by mouth  daily  . Respiratory Therapy Supplies (FLUTTER) DEVI Use as directed  . Tiotropium Bromide Monohydrate (SPIRIVA RESPIMAT) 2.5 MCG/ACT AERS Inhale 2 puffs by mouth every morning  . UNABLE TO FIND Med Name: CPAP with 2lpm o2 with sleep  . UNABLE TO FIND Med Name: Study drug- inhaler 2 pufss bid  . [DISCONTINUED] albuterol (PROAIR HFA) 108 (90 Base) MCG/ACT  inhaler 2 puffs every 4 hours as needed only  if your can't catch your breath  . [DISCONTINUED] famotidine (PEPCID) 20 MG tablet TAKE 1 TABLET BY MOUTH AT  BEDTIME  . doxycycline (VIBRA-TABS) 100 MG tablet Take 1 tablet (100 mg total) by mouth 2 (two) times daily.  . predniSONE (DELTASONE) 10 MG tablet Take 4 tabs for 2 days, then 3 tabs for 2 days, then 2 tabs for 2 days, then 1 tab for 2 days, then stop  Facility-Administered Encounter Medications as of 07/26/2018  Medication  . bupivacaine-epinephrine (MARCAINE W/ EPI) 0.5% -1:200000 injection  . [COMPLETED] levalbuterol (XOPENEX) nebulizer solution 0.63 mg     Review of Systems  Review of Systems  Constitutional: Negative.  Negative for chills and fever.  HENT: Negative.   Respiratory: Positive for cough. Negative for shortness of breath and wheezing.   Cardiovascular: Negative.  Negative for chest pain, palpitations and leg swelling.  Gastrointestinal: Negative.   Allergic/Immunologic: Negative.   Neurological: Negative.   Psychiatric/Behavioral: Negative.        Physical Exam  BP (!) 150/68 (BP Location: Left Arm, Patient Position: Sitting, Cuff Size: Normal)   Pulse 64   Ht 5\' 11"  (1.803 m)   Wt (!) 339 lb 9.6 oz (154 kg)   SpO2 96%   BMI 47.36 kg/m   Wt Readings from Last 5 Encounters:  07/26/18 (!) 339 lb 9.6 oz (154 kg)  06/09/18 (!) 335 lb 1.9 oz (152 kg)  07/28/17 (!) 331 lb (150.1 kg)  06/08/17 (!) 333 lb 3.2 oz (151.1 kg)  03/29/17 (!) 336 lb 4 oz (152.5 kg)     Physical Exam Vitals signs and nursing note reviewed.  Constitutional:      General: He is not in acute distress.    Appearance: He is well-developed.  Cardiovascular:     Rate and Rhythm: Normal rate and regular rhythm.  Pulmonary:     Effort: Pulmonary effort is normal. No respiratory distress.     Breath sounds: Normal breath sounds. No wheezing or rhonchi.  Skin:    General: Skin is warm and dry.  Neurological:     Mental Status: He  is alert and oriented to person, place, and time.       Assessment & Plan:   Acute bronchitis with COPD ([place]) Considering symptoms will treat for bronchitis.  Patient Instructions  Will order doxycycline Will order prednisone taper Continue mucinex May take delsym xopenex neb given in office today Follow up with Dr. [name] at his first available appointment or sooner if symptoms worsen       Fenton Foy, NP 07/27/2018

## 2018-07-27 ENCOUNTER — Encounter: Payer: Self-pay | Admitting: Nurse Practitioner

## 2018-07-27 DIAGNOSIS — J209 Acute bronchitis, unspecified: Secondary | ICD-10-CM | POA: Insufficient documentation

## 2018-07-27 DIAGNOSIS — J440 Chronic obstructive pulmonary disease with acute lower respiratory infection: Principal | ICD-10-CM

## 2018-07-27 NOTE — Assessment & Plan Note (Addendum)
Considering symptoms will treat for bronchitis.  Patient Instructions  Will order doxycycline Will order prednisone taper Continue mucinex May take delsym xopenex neb given in office today Follow up with Dr. [name] at his first available appointment or sooner if symptoms worsen

## 2018-07-28 NOTE — Progress Notes (Signed)
Chart and office note reviewed in detail  > agree with a/p as outlined    

## 2018-07-29 MED ORDER — AZITHROMYCIN 250 MG PO TABS: ORAL_TABLET | ORAL | 0 refills | Status: DC

## 2018-07-29 NOTE — Telephone Encounter (Signed)
Received the following message from patient:   ----- Message -----  From: Alyson Locket  Sent: 07/29/2018 12:01 AM EST  To: Christinia Gully, MD Subject: Visit Follow-Up Question  I was prescribed Doxycycline Hyclate 100mg  and every time I take this medicine I get the hiccups that last for several hours and I'm drinking at least 16oz of water with this medicine.Marland KitchenMarland KitchenI was wondering if that is ok???  Thanks  Shane Watson  [birth year] [phone]  MW, please advise. Thanks!

## 2018-07-29 NOTE — Telephone Encounter (Signed)
If having trouble swallowing it that is a concern as it can irritate the esophagus so rec change to zmax

## 2018-07-29 NOTE — Telephone Encounter (Signed)
Patient actually wrote back twice saying that it feels like the pill gets stuck in his throat at times and his SOB has increased.   MW, please advise thanks!

## 2018-07-29 NOTE — Telephone Encounter (Signed)
Unfortunately multiple other allergies so if can't tol the hiccups rec zpak   No harm usually come from hiccups though

## 2018-08-09 DIAGNOSIS — E291 Testicular hypofunction: Secondary | ICD-10-CM | POA: Diagnosis not present

## 2018-08-11 ENCOUNTER — Ambulatory Visit (INDEPENDENT_AMBULATORY_CARE_PROVIDER_SITE_OTHER): Payer: Medicare Other | Admitting: Internal Medicine

## 2018-08-11 ENCOUNTER — Encounter: Payer: Self-pay | Admitting: Internal Medicine

## 2018-08-11 VITALS — BP 148/72 | HR 77 | Ht 71.0 in | Wt 338.6 lb

## 2018-08-11 DIAGNOSIS — J440 Chronic obstructive pulmonary disease with acute lower respiratory infection: Secondary | ICD-10-CM

## 2018-08-11 DIAGNOSIS — J9612 Chronic respiratory failure with hypercapnia: Secondary | ICD-10-CM

## 2018-08-11 DIAGNOSIS — J209 Acute bronchitis, unspecified: Secondary | ICD-10-CM

## 2018-08-11 DIAGNOSIS — J449 Chronic obstructive pulmonary disease, unspecified: Secondary | ICD-10-CM

## 2018-08-11 NOTE — Progress Notes (Signed)
Subjective:   Patient ID: Shane Watson, male    DOB: 11-Jun-1946   MRN: [id number]    Brief patient profile:  16  yowm  Quit smoking 1987 at wt = 240   Referred 05/02/2013 by [name] for low 02. Sees [name] for primary and on cpap per his office with chronic R HD elevation documented first in 2005 c/w anterior eventration with GOLD II criteria copd 05/2013     History of Present Illness  05/02/2013 1st [facility] office visit/ Io Dieujuste cc indolent onset progressive doe x sev years with w/u by [name] with stent seemed to help at that point and no change uses HC sticker to go shopping and leans on cart at [facility] due to back but  does do some hunting walking x sev hundred feet.  Uses alb seems to help some with assoc subj wheeze rec Breo one puff daily automatically to see if breathing improves Please see patient coordinator before you leave today  to schedule overnight 02 sat on cpap> rx 2lpm     07/28/2017 acute extended ov/Hy Swiatek re: cough x Jan 6th / sore throat / worse p 4pm Chief Complaint  Patient presents with  . Acute Visit    Pt c/o cough with brown to yellow sputum, wheezing, increased SOB and chest tightness for the past 4 days. He has used his rescue inhaler once since onset of these symptoms. He has not used his neb. Still on study drug inhaler and off of symb and spiriva.   not sure what his rescue hfa is but gets it from study and has neb but has not used it  Was doing well until abrupt onset cough /chest congestion 4 d prior to OV   Cough esp prod in ams with assoc nasal congestion  rec zpak Prednisone 10 mg take  4 each am x 2 days,   2 each am x 2 days,  1 each am x 2 days and stop  For cough > mucinex dm up to 1200 mg every 12 hours and use flutter valve as much as you can For short of breath/ wheeze >> Please remember to go to the  x-ray department downstairs in the basement  for your tests - we will call you with the results when they are  available.    07/26/17 NP eval for aecopd rec Will order doxycycline Will order prednisone taper Continue mucinex May take delsym xopenex neb given in office today Follow up with Dr. [name] at his first available appointment or sooner if symptoms worsen      08/11/2018  f/u ov/Rockie Vawter re: aecopd / improved though could not swallow doxy Chief Complaint  Patient presents with  . Follow-up    Acute bronchitis with COPD  Dyspnea: back to baseline =   MMRC3 = can't walk 100 yards even at a slow pace at a flat grade s stopping due to sob  eg pushing cart at [employer] and then back bothers him  Cough: no Sleeping: on bed bed flat/ cpap SABA use: not much now in hfa/ no longer using neb as rescue  02: 2lpm per cpap o/w not using 02    No obvious day to day or daytime variability or assoc excess/ purulent sputum or mucus plugs or hemoptysis or cp or chest tightness, subjective wheeze or overt sinus or hb symptoms.   Sleeping as above without nocturnal  or early am exacerbation  of respiratory  c/o's or need for noct  saba. Also denies any obvious fluctuation of symptoms with weather or environmental changes or other aggravating or alleviating factors except as outlined above   No unusual exposure hx or h/o childhood pna/ asthma or knowledge of premature birth.  Current Allergies, Complete Past Medical History, Past Surgical History, Family History, and Social History were reviewed in [employer] record.  ROS  The following are not active complaints unless bolded Hoarseness, sore throat, dysphagia, dental problems, itching, sneezing,  nasal congestion or discharge of excess mucus or purulent secretions, ear ache,   fever, chills, sweats, unintended wt loss or wt gain, classically pleuritic or exertional cp,  orthopnea pnd or arm/hand swelling  or leg swelling, presyncope, palpitations, abdominal pain, anorexia, nausea, vomiting, diarrhea  or change in bowel habits or change  in bladder habits, change in stools or change in urine, dysuria, hematuria,  rash, arthralgias, visual complaints, headache, numbness, weakness or ataxia or problems with walking or coordination,  change in mood or  memory.        Current Meds  Medication Sig  . acetaminophen (TYLENOL) 500 MG tablet Take 500 mg by mouth at bedtime.  Marland Kitchen albuterol (PROAIR HFA) 108 (90 Base) MCG/ACT inhaler 2 puffs every 4 hours as needed only  if your can't catch your breath  . albuterol (PROVENTIL) (5 MG/ML) 0.5% nebulizer solution Take 2.5 mg by nebulization every 4 (four) hours as needed for wheezing or shortness of breath (((PLAN B))).   Marland Kitchen aspirin EC 81 MG tablet Take 81 mg by mouth daily.  Marland Kitchen atorvastatin (LIPITOR) 40 MG tablet TAKE 1 TABLET BY MOUTH AT  BEDTIME  . azithromycin (ZITHROMAX) 250 MG tablet Take 2 pills today then one a day for 4 additional days  . budesonide-formoterol (SYMBICORT) 160-4.5 MCG/ACT inhaler INHALE 2 PUFFS BY MOUTH FIRST THING IN THE MORNING AND THEN ANOTHER 2 PUFFS ABOUT 12 HOURS LATER  . cetirizine (ZYRTEC) 10 MG tablet Take 10 mg by mouth every morning.   . colesevelam (WELCHOL) 625 MG tablet Take 625 mg by mouth 2 (two) times daily.  Marland Kitchen diltiazem (CARDIZEM) 60 MG tablet Take 60 mg by mouth 4 (four) times daily as needed (tachycardia).  . diphenoxylate-atropine (LOMOTIL) 2.5-0.025 MG tablet Take 1 tablet by mouth as needed.  . doxazosin (CARDURA) 8 MG tablet Take 8 mg by mouth at bedtime.  . famotidine (PEPCID) 20 MG tablet Take 1 tablet (20 mg total) by mouth at bedtime.  . furosemide (LASIX) 20 MG tablet Take 2 tablets (40 mg total) by mouth daily. Please keep upcoming appt in November for future refills. Thank you  . gabapentin (NEURONTIN) 100 MG capsule Take 300 mg by mouth 2 (two) times daily. 3 capsules by mouth twice daily  . gabapentin (NEURONTIN) 300 MG capsule Take 1 capsule by mouth 2 (two) times daily.  Marland Kitchen gatifloxacin (ZYMAXID) 0.5 % SOLN Place 1 drop into both eyes  daily.   Marland Kitchen ipratropium (ATROVENT) 0.06 % nasal spray Place 2 sprays into both nostrils 4 (four) times daily.  Marland Kitchen JANUVIA 100 MG tablet Take 100 mg by mouth daily.  Marland Kitchen losartan (COZAAR) 50 MG tablet Take 1 tablet (50 mg total) by mouth daily. Please keep upcoming appt in November for future refills. Thank you  . meclizine (ANTIVERT) 12.5 MG tablet Take 12.5 mg by mouth 2 (two) times daily.  . Multiple Vitamin (MULTIVITAMIN WITH MINERALS) TABS tablet Take 1 tablet by mouth at bedtime.   . nitroGLYCERIN (NITROSTAT) 0.4 MG SL tablet Place  1 tablet (0.4 mg total) under the tongue every 5 (five) minutes as needed for chest pain.  Marland Kitchen oxybutynin (DITROPAN) 5 MG tablet Take 5 mg by mouth daily as needed for bladder spasms.  . OXYGEN Inhale 2 L into the lungs at bedtime.  . pantoprazole (PROTONIX) 40 MG tablet TAKE 1 TABLET BY MOUTH  DAILY  . potassium chloride SA (K-DUR,KLOR-CON) 20 MEQ tablet Take 1 tablet by mouth  daily  . prednisoLONE acetate (PRED FORTE) 1 % ophthalmic suspension INSTILL 1 DROP INTO SURGERY EYE TID X 1 WEEK THEN BID X 1 WEEK THEN ONCE D  X 14 DAYS  . predniSONE (DELTASONE) 10 MG tablet Take 4 tabs for 2 days, then 3 tabs for 2 days, then 2 tabs for 2 days, then 1 tab for 2 days, then stop  . Respiratory Therapy Supplies (FLUTTER) DEVI Use as directed  . Tiotropium Bromide Monohydrate (SPIRIVA RESPIMAT) 2.5 MCG/ACT AERS Inhale 2 puffs by mouth every morning  . UNABLE TO FIND Med Name: CPAP with 2lpm o2 with sleep  . UNABLE TO FIND Med Name: Study drug- inhaler 2 pufss bid                        Objective:   Physical Exam  08/10/2013      352 > 350 08/25/2013 > 10/17/2013 351 > 01/15/2014  345 > 05/08/2014 345 > 08/06/2014  354 > 02/04/2015 354 > 02/25/2015  354 > 05/17/2015 351 > 05/27/2015  349 > 08/27/2015  335 > 02/24/2016   315 > 08/27/2016  323 > 03/29/2017  336 > 07/28/2017    331 >  08/11/2018     338   amb obese wm nad   Vital signs reviewed - Note on arrival 02 sats  95% on RA      HEENT: nl dentition / oropharynx. Nl external ear canals without cough reflex -  Mild bilateral non-specific turbinate edema     NECK :  without JVD/Nodes/TM/ nl carotid upstrokes bilaterally   LUNGS: no acc muscle use,  Mild barrel  contour chest wall with bilateral  Distant bs s audible wheeze and  without cough on insp or exp maneuver and mild  Hyperresonant  to  percussion bilaterally     CV:  RRR  no s3 or murmur or increase in P2, and 1+ ptting LE =  wearing elasic hose   ABD:   Tensely obese with poor insp movement   in the supine position. No bruits or organomegaly appreciated, bowel sounds nl  MS:   Nl gait/  ext warm without deformities, calf tenderness, cyanosis or clubbing No obvious joint restrictions   SKIN: warm and dry without lesions    NEURO:  alert, approp, nl sensorium with  no motor or cerebellar deficits apparent.             Assessment & Plan:

## 2018-08-11 NOTE — Patient Instructions (Addendum)
Strongly recommend you resume regular water aerobics    Please schedule a follow up visit in 6  months but call sooner if needed with pfts on return

## 2018-08-14 ENCOUNTER — Encounter: Payer: Self-pay | Admitting: Internal Medicine

## 2018-08-14 NOTE — Assessment & Plan Note (Addendum)
Quit smoking 1980  - started breo 05/02/13 > d/c 06/06/13 and replaced with symbicort   - PFT's 06/06/13  FEV1 1.59 (47%) ratio 62 and 13% better despite  breo am ov test, DLC0 82 corrects to 113% - med calendar    08/25/2013   - flutter valve added 02/04/15  - 02/25/2015  Walked RA x 3 laps @ 185 ft each stopped due to End of study, nl pace, no desat  / min sob/ some hip and leg pain  - referred to rehab 02/25/2015   - re-inforced flutter valve 05/27/2015   08/11/2018  After extensive coaching inhaler device,  effectiveness =    90%     Back to baseline p aecopd =  Group D in terms of symptom/risk and laba/lama/ICS  therefore appropriate rx at this point > so continue symb/spiriva but work on conditioning - water aerobic has been best option longterm

## 2018-08-14 NOTE — Assessment & Plan Note (Signed)
Body mass index is 47.23 kg/m.  -  trending up Lab Results  Component Value Date   TSH 0.82 02/04/2015     Contributing to gerd risk/ doe/reviewed the need and the process to achieve and maintain neg calorie balance > defer f/u primary care including intermittently monitoring thyroid status      I had an extended discussion with the patient reviewing all relevant studies completed to date and  lasting 15 to 20 minutes of a 25 minute visit    See device teaching which extended face to face time for this visit.  Each maintenance medication was reviewed in detail including emphasizing most importantly the difference between maintenance and prns and under what circumstances the prns are to be triggered using an action plan format that is not reflected in the computer generated alphabetically organized AVS which I have not found useful in most complex patients, especially with respiratory illnesses  Please see AVS for specific instructions unique to this visit that I personally wrote and verbalized to the the pt in detail and then reviewed with pt  by my nurse highlighting any  changes in therapy recommended at today's visit to their plan of care.

## 2018-08-14 NOTE — Assessment & Plan Note (Signed)
-   HCO3 33 02/04/15  - 02/25/2015 no desat's walking  - 08/27/2015  Walked RA x 3 laps @ 185 ft each stopped due to  End of study, nl pace, no sob or desat     rec as of 08/14/2018 02 2lpm with cpap only at bedtime

## 2018-08-30 DIAGNOSIS — E291 Testicular hypofunction: Secondary | ICD-10-CM | POA: Diagnosis not present

## 2018-09-18 ENCOUNTER — Other Ambulatory Visit: Payer: Self-pay | Admitting: Internal Medicine

## 2018-09-20 DIAGNOSIS — E291 Testicular hypofunction: Secondary | ICD-10-CM | POA: Diagnosis not present

## 2018-09-21 ENCOUNTER — Other Ambulatory Visit: Payer: Self-pay | Admitting: Cardiology

## 2018-10-01 ENCOUNTER — Other Ambulatory Visit: Payer: Self-pay | Admitting: Cardiology

## 2018-10-12 DIAGNOSIS — N400 Enlarged prostate without lower urinary tract symptoms: Secondary | ICD-10-CM | POA: Diagnosis not present

## 2018-10-12 DIAGNOSIS — N183 Chronic kidney disease, stage 3 (moderate): Secondary | ICD-10-CM | POA: Diagnosis not present

## 2018-10-12 DIAGNOSIS — J441 Chronic obstructive pulmonary disease with (acute) exacerbation: Secondary | ICD-10-CM | POA: Diagnosis not present

## 2018-10-12 DIAGNOSIS — J449 Chronic obstructive pulmonary disease, unspecified: Secondary | ICD-10-CM | POA: Diagnosis not present

## 2018-10-12 DIAGNOSIS — E1142 Type 2 diabetes mellitus with diabetic polyneuropathy: Secondary | ICD-10-CM | POA: Diagnosis not present

## 2018-10-12 DIAGNOSIS — E1165 Type 2 diabetes mellitus with hyperglycemia: Secondary | ICD-10-CM | POA: Diagnosis not present

## 2018-10-12 DIAGNOSIS — I10 Essential (primary) hypertension: Secondary | ICD-10-CM | POA: Diagnosis not present

## 2018-10-12 DIAGNOSIS — I2510 Atherosclerotic heart disease of native coronary artery without angina pectoris: Secondary | ICD-10-CM | POA: Diagnosis not present

## 2018-10-12 DIAGNOSIS — Z7984 Long term (current) use of oral hypoglycemic drugs: Secondary | ICD-10-CM | POA: Diagnosis not present

## 2018-10-13 DIAGNOSIS — G4489 Other headache syndrome: Secondary | ICD-10-CM | POA: Diagnosis not present

## 2018-10-13 DIAGNOSIS — J0110 Acute frontal sinusitis, unspecified: Secondary | ICD-10-CM | POA: Diagnosis not present

## 2018-10-19 DIAGNOSIS — G4489 Other headache syndrome: Secondary | ICD-10-CM | POA: Diagnosis not present

## 2018-11-14 ENCOUNTER — Other Ambulatory Visit: Payer: Self-pay | Admitting: Cardiology

## 2018-11-14 MED ORDER — NITROGLYCERIN 0.4 MG SL SUBL: 0.4000 mg | SUBLINGUAL_TABLET | SUBLINGUAL | 4 refills | Status: AC | PRN

## 2018-11-14 NOTE — Telephone Encounter (Signed)
Pt's medication was sent to pt's pharmacy as requested. Confirmation received.  °

## 2018-11-17 DIAGNOSIS — E1142 Type 2 diabetes mellitus with diabetic polyneuropathy: Secondary | ICD-10-CM | POA: Diagnosis not present

## 2018-11-17 DIAGNOSIS — E1165 Type 2 diabetes mellitus with hyperglycemia: Secondary | ICD-10-CM | POA: Diagnosis not present

## 2018-11-17 DIAGNOSIS — I2510 Atherosclerotic heart disease of native coronary artery without angina pectoris: Secondary | ICD-10-CM | POA: Diagnosis not present

## 2018-11-17 DIAGNOSIS — J441 Chronic obstructive pulmonary disease with (acute) exacerbation: Secondary | ICD-10-CM | POA: Diagnosis not present

## 2018-11-17 DIAGNOSIS — E7800 Pure hypercholesterolemia, unspecified: Secondary | ICD-10-CM | POA: Diagnosis not present

## 2018-11-17 DIAGNOSIS — N400 Enlarged prostate without lower urinary tract symptoms: Secondary | ICD-10-CM | POA: Diagnosis not present

## 2018-11-17 DIAGNOSIS — J449 Chronic obstructive pulmonary disease, unspecified: Secondary | ICD-10-CM | POA: Diagnosis not present

## 2018-11-17 DIAGNOSIS — I10 Essential (primary) hypertension: Secondary | ICD-10-CM | POA: Diagnosis not present

## 2018-11-17 DIAGNOSIS — N183 Chronic kidney disease, stage 3 (moderate): Secondary | ICD-10-CM | POA: Diagnosis not present

## 2018-11-17 DIAGNOSIS — Z7984 Long term (current) use of oral hypoglycemic drugs: Secondary | ICD-10-CM | POA: Diagnosis not present

## 2018-11-29 DIAGNOSIS — E291 Testicular hypofunction: Secondary | ICD-10-CM | POA: Diagnosis not present

## 2018-12-09 ENCOUNTER — Other Ambulatory Visit: Payer: Self-pay | Admitting: Internal Medicine

## 2018-12-09 DIAGNOSIS — E291 Testicular hypofunction: Secondary | ICD-10-CM | POA: Diagnosis not present

## 2018-12-09 DIAGNOSIS — Z7984 Long term (current) use of oral hypoglycemic drugs: Secondary | ICD-10-CM | POA: Diagnosis not present

## 2018-12-09 DIAGNOSIS — I872 Venous insufficiency (chronic) (peripheral): Secondary | ICD-10-CM | POA: Diagnosis not present

## 2018-12-09 DIAGNOSIS — I2510 Atherosclerotic heart disease of native coronary artery without angina pectoris: Secondary | ICD-10-CM | POA: Diagnosis not present

## 2018-12-09 DIAGNOSIS — I10 Essential (primary) hypertension: Secondary | ICD-10-CM | POA: Diagnosis not present

## 2018-12-09 DIAGNOSIS — E1142 Type 2 diabetes mellitus with diabetic polyneuropathy: Secondary | ICD-10-CM | POA: Diagnosis not present

## 2018-12-09 DIAGNOSIS — E7800 Pure hypercholesterolemia, unspecified: Secondary | ICD-10-CM | POA: Diagnosis not present

## 2018-12-09 DIAGNOSIS — Z0000 Encounter for general adult medical examination without abnormal findings: Secondary | ICD-10-CM | POA: Diagnosis not present

## 2018-12-09 DIAGNOSIS — N400 Enlarged prostate without lower urinary tract symptoms: Secondary | ICD-10-CM | POA: Diagnosis not present

## 2018-12-09 DIAGNOSIS — G4733 Obstructive sleep apnea (adult) (pediatric): Secondary | ICD-10-CM | POA: Diagnosis not present

## 2018-12-09 DIAGNOSIS — N183 Chronic kidney disease, stage 3 (moderate): Secondary | ICD-10-CM | POA: Diagnosis not present

## 2018-12-09 DIAGNOSIS — J449 Chronic obstructive pulmonary disease, unspecified: Secondary | ICD-10-CM | POA: Diagnosis not present

## 2018-12-26 DIAGNOSIS — E7800 Pure hypercholesterolemia, unspecified: Secondary | ICD-10-CM | POA: Diagnosis not present

## 2018-12-26 DIAGNOSIS — E1142 Type 2 diabetes mellitus with diabetic polyneuropathy: Secondary | ICD-10-CM | POA: Diagnosis not present

## 2018-12-26 DIAGNOSIS — E119 Type 2 diabetes mellitus without complications: Secondary | ICD-10-CM | POA: Diagnosis not present

## 2018-12-26 DIAGNOSIS — E291 Testicular hypofunction: Secondary | ICD-10-CM | POA: Diagnosis not present

## 2018-12-26 DIAGNOSIS — Z125 Encounter for screening for malignant neoplasm of prostate: Secondary | ICD-10-CM | POA: Diagnosis not present

## 2018-12-26 DIAGNOSIS — I10 Essential (primary) hypertension: Secondary | ICD-10-CM | POA: Diagnosis not present

## 2018-12-28 DIAGNOSIS — L03116 Cellulitis of left lower limb: Secondary | ICD-10-CM | POA: Diagnosis not present

## 2019-01-13 DIAGNOSIS — L03116 Cellulitis of left lower limb: Secondary | ICD-10-CM | POA: Diagnosis not present

## 2019-01-16 DIAGNOSIS — I10 Essential (primary) hypertension: Secondary | ICD-10-CM | POA: Diagnosis not present

## 2019-01-16 DIAGNOSIS — E1142 Type 2 diabetes mellitus with diabetic polyneuropathy: Secondary | ICD-10-CM | POA: Diagnosis not present

## 2019-01-16 DIAGNOSIS — N183 Chronic kidney disease, stage 3 (moderate): Secondary | ICD-10-CM | POA: Diagnosis not present

## 2019-01-16 DIAGNOSIS — I2510 Atherosclerotic heart disease of native coronary artery without angina pectoris: Secondary | ICD-10-CM | POA: Diagnosis not present

## 2019-01-16 DIAGNOSIS — J441 Chronic obstructive pulmonary disease with (acute) exacerbation: Secondary | ICD-10-CM | POA: Diagnosis not present

## 2019-01-16 DIAGNOSIS — Z7984 Long term (current) use of oral hypoglycemic drugs: Secondary | ICD-10-CM | POA: Diagnosis not present

## 2019-01-16 DIAGNOSIS — E7800 Pure hypercholesterolemia, unspecified: Secondary | ICD-10-CM | POA: Diagnosis not present

## 2019-01-16 DIAGNOSIS — E1165 Type 2 diabetes mellitus with hyperglycemia: Secondary | ICD-10-CM | POA: Diagnosis not present

## 2019-01-16 DIAGNOSIS — N400 Enlarged prostate without lower urinary tract symptoms: Secondary | ICD-10-CM | POA: Diagnosis not present

## 2019-01-18 DIAGNOSIS — E291 Testicular hypofunction: Secondary | ICD-10-CM | POA: Diagnosis not present

## 2019-01-27 ENCOUNTER — Other Ambulatory Visit: Payer: Self-pay | Admitting: Internal Medicine

## 2019-02-06 ENCOUNTER — Other Ambulatory Visit (HOSPITAL_COMMUNITY)
Admission: RE | Admit: 2019-02-06 | Discharge: 2019-02-06 | Disposition: A | Discharge status: Home / Self Care | Payer: Medicare Other | Source: Ambulatory Visit | Attending: Internal Medicine | Admitting: Internal Medicine

## 2019-02-06 DIAGNOSIS — Z1159 Encounter for screening for other viral diseases: Secondary | ICD-10-CM | POA: Insufficient documentation

## 2019-02-06 LAB — SARS CORONAVIRUS 2 (TAT 6-24 HRS): SARS Coronavirus 2: NEGATIVE

## 2019-02-09 ENCOUNTER — Ambulatory Visit: Payer: Medicare Other | Admitting: Internal Medicine

## 2019-02-09 ENCOUNTER — Other Ambulatory Visit: Payer: Self-pay

## 2019-02-09 ENCOUNTER — Ambulatory Visit (INDEPENDENT_AMBULATORY_CARE_PROVIDER_SITE_OTHER): Payer: Medicare Other | Admitting: Nurse Practitioner

## 2019-02-09 ENCOUNTER — Encounter: Payer: Self-pay | Admitting: Nurse Practitioner

## 2019-02-09 ENCOUNTER — Ambulatory Visit (INDEPENDENT_AMBULATORY_CARE_PROVIDER_SITE_OTHER): Payer: Medicare Other | Admitting: Internal Medicine

## 2019-02-09 DIAGNOSIS — J449 Chronic obstructive pulmonary disease, unspecified: Secondary | ICD-10-CM

## 2019-02-09 DIAGNOSIS — E291 Testicular hypofunction: Secondary | ICD-10-CM | POA: Diagnosis not present

## 2019-02-09 LAB — PULMONARY FUNCTION TEST
DL/VA % pred: 116 %
DL/VA: 4.66 ml/min/mmHg/L
DLCO unc % pred: 101 %
DLCO unc: 25.93 ml/min/mmHg
FEF 25-75 Post: 1.61 L/sec
FEF 25-75 Pre: 1.08 L/sec
FEF2575-%Change-Post: 49 %
FEF2575-%Pred-Post: 69 %
FEF2575-%Pred-Pre: 46 %
FEV1-%Change-Post: 15 %
FEV1-%Pred-Post: 63 %
FEV1-%Pred-Pre: 55 %
FEV1-Post: 2.01 L
FEV1-Pre: 1.74 L
FEV1FVC-%Change-Post: 11 %
FEV1FVC-%Pred-Pre: 91 %
FEV6-%Change-Post: 4 %
FEV6-%Pred-Post: 66 %
FEV6-%Pred-Pre: 63 %
FEV6-Post: 2.69 L
FEV6-Pre: 2.58 L
FEV6FVC-%Change-Post: 0 %
FEV6FVC-%Pred-Post: 105 %
FEV6FVC-%Pred-Pre: 106 %
FVC-%Change-Post: 3 %
FVC-%Pred-Post: 62 %
FVC-%Pred-Pre: 60 %
FVC-Post: 2.71 L
FVC-Pre: 2.61 L
Post FEV1/FVC ratio: 74 %
Post FEV6/FVC ratio: 99 %
Pre FEV1/FVC ratio: 66 %
Pre FEV6/FVC Ratio: 100 %
RV % pred: 144 %
RV: 3.64 L
TLC % pred: 90 %
TLC: 6.39 L

## 2019-02-09 NOTE — Assessment & Plan Note (Signed)
Patient presents today for a follow-up visit.  States that this is been a stable interval for him.  He is compliant with Symbicort, Spiriva, and albuterol.  He states that he has not had to use his albuterol in a while.  Patient states that he does have a flutter valve but does not use it often.  Denies any significant shortness of breath or any significant cough at this time.  Patient did have a PFT in office today which was overall stable from last PFT in 2014.  Patient is asking today about possibly switching to Trelegy.  He was advised to check with his insurance company about cost.  He is currently having issues with cost of Symbicort and Spiriva.   Patient Instructions  Continue Symbicort Continue Spiriva Continue albuterol Continue flutter valve as needed Stay active - may start water aerobics when gym reopens  Follow up: Follow up with Dr. [name] in 6 months or sooner if needed

## 2019-02-09 NOTE — Progress Notes (Signed)
@Patient  ID: Shane Watson, male    DOB: 1946/04/16, 73 y.o.   MRN: [id number]  Chief Complaint  Patient presents with  . Results    Discuss results of PFT (OV and test same day).    Referring provider: Shirline Frees, MD  HPI 73 year old male former smoker with COPD and chronic respiratory failure with hypercapnia who is followed by Dr. [name]. Maintenance: Symbicort, Spiriva, pro-air  Tests/significant events:  Quit smoking 1980  - started breo 05/02/13 > d/c 06/06/13 and replaced with symbicort   - PFT's 06/06/13  FEV1 1.59 (47%) ratio 62 and 13% better despite  breo am ov test, DLC0 82 corrects to 113% - med calendar    08/25/2013   - flutter valve added 02/04/15  - 02/25/2015  Walked RA x 3 laps @ 185 ft each stopped due to End of study, nl pace, no desat  / min sob/ some hip and leg pain  - referred to rehab 02/25/2015   - re-inforced flutter valve 05/27/2015   08/11/2018  After extensive coaching inhaler device,  effectiveness =    90%  - HCO3 33 02/04/15  - 02/25/2015 no desat's walking  - 08/27/2015  Walked RA x 3 laps @ 185 ft each stopped due to  End of study, nl pace, no sob or desat     rec as of 08/14/2018 02 2lpm with cpap only at bedtime   PFT Results Latest Ref Rng & Units 02/09/2019  FVC-Pre L 2.61  FVC-Predicted Pre % 60  FVC-Post L 2.71  FVC-Predicted Post % 62  Pre FEV1/FVC % % 66  Post FEV1/FCV % % 74  FEV1-Pre L 1.74  FEV1-Predicted Pre % 55  FEV1-Post L 2.01  DLCO UNC% % 101  DLCO COR %Predicted % 116  TLC L 6.39  TLC % Predicted % 90  RV % Predicted % 144    OV 02/09/19 - Follow up Patient presents today for a follow-up visit.  States that this is been a stable interval for him.  He is compliant with Symbicort, Spiriva, and albuterol.  He states that he has not had to use his albuterol in a while.  Patient states that he does have a flutter valve but does not use it often.  Denies any significant shortness of breath or any significant cough at this  time.  Patient did have a PFT in office today which was overall stable from last PFT in 2014.  Patient is asking today about possibly switching to Trelegy.  He was advised to check with his insurance company about cost.  He is currently having issues with cost of Symbicort and Spiriva. Denies f/c/s, n/v/d, hemoptysis, PND, leg swelling.    Allergies  Allergen Reactions  . Aspirin Swelling    Angioedema-must take EC coated form only  . Ibuprofen Swelling    Angioedema   . Penicillins Hives  . Sulfa Antibiotics Hives    Immunization History  Administered Date(s) Administered  . Influenza Split 04/05/2013, 04/19/2014, 05/06/2015  . Influenza-Unspecified 04/19/2017  . Pneumococcal Polysaccharide-23 07/20/2009  . Zoster 07/20/2009  . Zoster Recombinat (Shingrix) 04/25/2018    Past Medical History:  Diagnosis Date  . Allergy   . Anemia   . Arthritis   . BPH (benign prostatic hyperplasia)   . CKD (chronic kidney disease), stage III ([place])   . Complication of anesthesia   . COPD (chronic obstructive pulmonary disease) ([place])    a. Uses Home O2 w/ exertion.  . Coronary  artery disease    a. 11/2010 Cath: LM nl, LAD 80m (3.5x22 and 3.5x12 Resolute DES'), LCX nl, OM1/2/3 nl, RCA 20-30p, PDA nl.   . Diabetes mellitus without complication ([place])   . GERD (gastroesophageal reflux disease)   . H/O echocardiogram    a. per notes in 11/2010 - nl EF, basal inf wma.  . Heart murmur   . History of kidney stones   . Hyperlipidemia   . Hypertensive heart disease   . Hyperthyroidism   . Neuropathy   . Neuropathy   . OSA (obstructive sleep apnea)    a. Uses CPAP.  Marland Kitchen PONV (postoperative nausea and vomiting)   . Vertigo     Tobacco History: Social History   Tobacco Use  Smoking Status Former Smoker  . Packs/day: 1.50  . Years: 15.00  . Pack years: 22.50  . Types: Cigarettes  . Quit date: 04/29/1987  . Years since quitting: 31.8  Smokeless Tobacco Never Used   Counseling given: Not  Answered   Outpatient Encounter Medications as of 02/09/2019  Medication Sig  . acetaminophen (TYLENOL) 500 MG tablet Take 500 mg by mouth at bedtime.  Marland Kitchen albuterol (PROAIR HFA) 108 (90 Base) MCG/ACT inhaler 2 puffs every 4 hours as needed only  if your can't catch your breath  . albuterol (PROVENTIL) (5 MG/ML) 0.5% nebulizer solution Take 2.5 mg by nebulization every 4 (four) hours as needed for wheezing or shortness of breath (((PLAN B))).   Marland Kitchen aspirin EC 81 MG tablet Take 81 mg by mouth daily.  Marland Kitchen atorvastatin (LIPITOR) 40 MG tablet TAKE 1 TABLET BY MOUTH AT  BEDTIME  . budesonide-formoterol (SYMBICORT) 160-4.5 MCG/ACT inhaler INHALE 2 PUFFS BY MOUTH FIRST THING IN THE MORNING AND THEN ANOTHER 2 PUFFS ABOUT 12 HOURS LATER  . cetirizine (ZYRTEC) 10 MG tablet Take 10 mg by mouth every morning.   . colesevelam (WELCHOL) 625 MG tablet Take 625 mg by mouth 2 (two) times daily.  Marland Kitchen diltiazem (CARDIZEM) 60 MG tablet Take 60 mg by mouth 4 (four) times daily as needed (tachycardia).  . diphenoxylate-atropine (LOMOTIL) 2.5-0.025 MG tablet Take 1 tablet by mouth as needed.  . doxazosin (CARDURA) 8 MG tablet Take 8 mg by mouth at bedtime.  . famotidine (PEPCID) 20 MG tablet Take 1 tablet (20 mg total) by mouth at bedtime.  . furosemide (LASIX) 20 MG tablet TAKE 2 TABLETS BY MOUTH  DAILY  . gabapentin (NEURONTIN) 100 MG capsule Take 300 mg by mouth 2 (two) times daily. 3 capsules by mouth twice daily  . gabapentin (NEURONTIN) 300 MG capsule Take 1 capsule by mouth 2 (two) times daily.  Marland Kitchen gatifloxacin (ZYMAXID) 0.5 % SOLN Place 1 drop into both eyes daily.   Marland Kitchen ipratropium (ATROVENT) 0.06 % nasal spray Place 2 sprays into both nostrils 4 (four) times daily.  Marland Kitchen JANUVIA 100 MG tablet Take 100 mg by mouth daily.  Marland Kitchen losartan (COZAAR) 50 MG tablet TAKE 1 TABLET BY MOUTH  DAILY (Patient taking differently: 2 tablets daily)  . meclizine (ANTIVERT) 12.5 MG tablet Take 12.5 mg by mouth 2 (two) times daily.  .  Multiple Vitamin (MULTIVITAMIN WITH MINERALS) TABS tablet Take 1 tablet by mouth at bedtime.   . nitroGLYCERIN (NITROSTAT) 0.4 MG SL tablet Place 1 tablet (0.4 mg total) under the tongue every 5 (five) minutes as needed for chest pain.  Glory Rosebush VERIO test strip U UTD ONCE A DAY  . oxybutynin (DITROPAN) 5 MG tablet Take 5 mg by  mouth daily as needed for bladder spasms.  . OXYGEN Inhale 2 L into the lungs at bedtime.  . pantoprazole (PROTONIX) 40 MG tablet TAKE 1 TABLET BY MOUTH  DAILY  . potassium chloride SA (K-DUR,KLOR-CON) 20 MEQ tablet Take 1 tablet by mouth  daily  . prednisoLONE acetate (PRED FORTE) 1 % ophthalmic suspension INSTILL 1 DROP INTO SURGERY EYE TID X 1 WEEK THEN BID X 1 WEEK THEN ONCE D  X 14 DAYS  . Respiratory Therapy Supplies (FLUTTER) DEVI Use as directed  . Tiotropium Bromide Monohydrate (SPIRIVA RESPIMAT) 2.5 MCG/ACT AERS Inhale 2 puffs by mouth every morning  . UNABLE TO FIND Med Name: CPAP with 2lpm o2 with sleep  . UNABLE TO FIND Med Name: Study drug- inhaler 2 pufss bid  . azithromycin (ZITHROMAX) 250 MG tablet Take 2 pills today then one a day for 4 additional days (Patient not taking: Reported on 02/09/2019)  . doxycycline (VIBRA-TABS) 100 MG tablet Take 1 tablet (100 mg total) by mouth 2 (two) times daily. (Patient not taking: Reported on 02/09/2019)  . predniSONE (DELTASONE) 10 MG tablet Take 4 tabs for 2 days, then 3 tabs for 2 days, then 2 tabs for 2 days, then 1 tab for 2 days, then stop (Patient not taking: Reported on 02/09/2019)   Facility-Administered Encounter Medications as of 02/09/2019  Medication  . bupivacaine-epinephrine (MARCAINE W/ EPI) 0.5% -1:200000 injection     Review of Systems  Review of Systems  Constitutional: Negative.  Negative for chills and fever.  HENT: Negative.   Respiratory: Negative for cough, shortness of breath and wheezing.   Cardiovascular: Negative.  Negative for chest pain, palpitations and leg swelling.   Gastrointestinal: Negative.   Allergic/Immunologic: Negative.   Neurological: Negative.   Psychiatric/Behavioral: Negative.        Physical Exam  BP 124/62 (BP Location: Left Arm, Patient Position: Sitting, Cuff Size: Normal)   Pulse 72   Temp 98.2 F (36.8 C)   Ht 5\' 10"  (1.778 m)   Wt (!) 335 lb (152 kg)   SpO2 92%   BMI 48.07 kg/m   Wt Readings from Last 5 Encounters:  02/09/19 (!) 335 lb (152 kg)  08/11/18 (!) 338 lb 9.6 oz (153.6 kg)  07/26/18 (!) 339 lb 9.6 oz (154 kg)  06/09/18 (!) 335 lb 1.9 oz (152 kg)  07/28/17 (!) 331 lb (150.1 kg)     Physical Exam Vitals signs and nursing note reviewed.  Constitutional:      General: He is not in acute distress.    Appearance: He is well-developed.  Cardiovascular:     Rate and Rhythm: Normal rate and regular rhythm.  Pulmonary:     Effort: Pulmonary effort is normal. No respiratory distress.     Breath sounds: Normal breath sounds. No wheezing or rhonchi.  Musculoskeletal:        General: No swelling.  Skin:    General: Skin is warm and dry.  Neurological:     Mental Status: He is alert and oriented to person, place, and time.       Assessment & Plan:   COPD  GOLD II with reversibilty  Patient presents today for a follow-up visit.  States that this is been a stable interval for him.  He is compliant with Symbicort, Spiriva, and albuterol.  He states that he has not had to use his albuterol in a while.  Patient states that he does have a flutter valve but does not use it  often.  Denies any significant shortness of breath or any significant cough at this time.  Patient did have a PFT in office today which was overall stable from last PFT in 2014.  Patient is asking today about possibly switching to Trelegy.  He was advised to check with his insurance company about cost.  He is currently having issues with cost of Symbicort and Spiriva.   Patient Instructions  Continue Symbicort Continue Spiriva Continue albuterol  Continue flutter valve as needed Stay active - may start water aerobics when gym reopens  Follow up: Follow up with Dr. [name] in 6 months or sooner if needed        Fenton Foy, NP 02/09/2019

## 2019-02-09 NOTE — Progress Notes (Signed)
PFT Done today. 

## 2019-02-09 NOTE — Patient Instructions (Signed)
Continue Symbicort Continue Spiriva Continue albuterol Continue flutter valve as needed Stay active - may start water aerobics when gym reopens  Follow up: Follow up with Dr. [name] in 6 months or sooner if needed

## 2019-02-12 ENCOUNTER — Encounter: Payer: Self-pay | Admitting: Internal Medicine

## 2019-02-20 NOTE — Progress Notes (Signed)
Chart and office note reviewed in detail  > agree with a/p as outlined    

## 2019-02-28 ENCOUNTER — Other Ambulatory Visit: Payer: Self-pay | Admitting: Cardiology

## 2019-03-07 DIAGNOSIS — E291 Testicular hypofunction: Secondary | ICD-10-CM | POA: Diagnosis not present

## 2019-03-17 DIAGNOSIS — E1165 Type 2 diabetes mellitus with hyperglycemia: Secondary | ICD-10-CM | POA: Diagnosis not present

## 2019-03-17 DIAGNOSIS — I2510 Atherosclerotic heart disease of native coronary artery without angina pectoris: Secondary | ICD-10-CM | POA: Diagnosis not present

## 2019-03-17 DIAGNOSIS — E1142 Type 2 diabetes mellitus with diabetic polyneuropathy: Secondary | ICD-10-CM | POA: Diagnosis not present

## 2019-03-17 DIAGNOSIS — N400 Enlarged prostate without lower urinary tract symptoms: Secondary | ICD-10-CM | POA: Diagnosis not present

## 2019-03-17 DIAGNOSIS — N183 Chronic kidney disease, stage 3 (moderate): Secondary | ICD-10-CM | POA: Diagnosis not present

## 2019-03-17 DIAGNOSIS — I10 Essential (primary) hypertension: Secondary | ICD-10-CM | POA: Diagnosis not present

## 2019-03-17 DIAGNOSIS — J441 Chronic obstructive pulmonary disease with (acute) exacerbation: Secondary | ICD-10-CM | POA: Diagnosis not present

## 2019-03-17 DIAGNOSIS — E7800 Pure hypercholesterolemia, unspecified: Secondary | ICD-10-CM | POA: Diagnosis not present

## 2019-03-22 DIAGNOSIS — I10 Essential (primary) hypertension: Secondary | ICD-10-CM | POA: Diagnosis not present

## 2019-03-22 DIAGNOSIS — L03116 Cellulitis of left lower limb: Secondary | ICD-10-CM | POA: Diagnosis not present

## 2019-03-22 DIAGNOSIS — R600 Localized edema: Secondary | ICD-10-CM | POA: Diagnosis not present

## 2019-03-28 DIAGNOSIS — E291 Testicular hypofunction: Secondary | ICD-10-CM | POA: Diagnosis not present

## 2019-04-04 ENCOUNTER — Other Ambulatory Visit: Payer: Self-pay | Admitting: Internal Medicine

## 2019-04-18 DIAGNOSIS — Z23 Encounter for immunization: Secondary | ICD-10-CM | POA: Diagnosis not present

## 2019-04-18 DIAGNOSIS — E291 Testicular hypofunction: Secondary | ICD-10-CM | POA: Diagnosis not present

## 2019-05-01 ENCOUNTER — Ambulatory Visit
Admission: RE | Admit: 2019-05-01 | Discharge: 2019-05-01 | Disposition: A | Discharge status: Home / Self Care | Payer: Medicare Other | Source: Ambulatory Visit | Attending: Family Medicine | Admitting: Family Medicine

## 2019-05-01 ENCOUNTER — Other Ambulatory Visit: Payer: Self-pay | Admitting: Family Medicine

## 2019-05-01 DIAGNOSIS — I10 Essential (primary) hypertension: Secondary | ICD-10-CM | POA: Diagnosis not present

## 2019-05-01 DIAGNOSIS — R1011 Right upper quadrant pain: Secondary | ICD-10-CM

## 2019-05-01 DIAGNOSIS — M545 Low back pain: Secondary | ICD-10-CM | POA: Diagnosis not present

## 2019-05-01 MED ORDER — IOPAMIDOL (ISOVUE-300) INJECTION 61%
100.0000 mL | Freq: Once | INTRAVENOUS | Status: AC | PRN
  Administered 2019-05-01: 100 mL via INTRAVENOUS

## 2019-05-02 ENCOUNTER — Other Ambulatory Visit: Payer: Self-pay | Admitting: Family Medicine

## 2019-05-02 ENCOUNTER — Other Ambulatory Visit (HOSPITAL_COMMUNITY): Payer: Self-pay | Admitting: Family Medicine

## 2019-05-02 DIAGNOSIS — R590 Localized enlarged lymph nodes: Secondary | ICD-10-CM

## 2019-05-02 DIAGNOSIS — M899 Disorder of bone, unspecified: Secondary | ICD-10-CM

## 2019-05-02 DIAGNOSIS — R1011 Right upper quadrant pain: Secondary | ICD-10-CM

## 2019-05-03 DIAGNOSIS — H524 Presbyopia: Secondary | ICD-10-CM | POA: Diagnosis not present

## 2019-05-03 DIAGNOSIS — Z961 Presence of intraocular lens: Secondary | ICD-10-CM | POA: Diagnosis not present

## 2019-05-03 DIAGNOSIS — H52221 Regular astigmatism, right eye: Secondary | ICD-10-CM | POA: Diagnosis not present

## 2019-05-03 DIAGNOSIS — Z9849 Cataract extraction status, unspecified eye: Secondary | ICD-10-CM | POA: Diagnosis not present

## 2019-05-03 DIAGNOSIS — E119 Type 2 diabetes mellitus without complications: Secondary | ICD-10-CM | POA: Diagnosis not present

## 2019-05-03 DIAGNOSIS — H5213 Myopia, bilateral: Secondary | ICD-10-CM | POA: Diagnosis not present

## 2019-05-04 ENCOUNTER — Ambulatory Visit (HOSPITAL_COMMUNITY)
Admission: RE | Admit: 2019-05-04 | Discharge: 2019-05-04 | Disposition: A | Discharge status: Home / Self Care | Payer: Medicare Other | Source: Ambulatory Visit | Attending: Family Medicine | Admitting: Family Medicine

## 2019-05-04 ENCOUNTER — Other Ambulatory Visit: Payer: Self-pay

## 2019-05-04 DIAGNOSIS — M899 Disorder of bone, unspecified: Secondary | ICD-10-CM | POA: Insufficient documentation

## 2019-05-04 DIAGNOSIS — R918 Other nonspecific abnormal finding of lung field: Secondary | ICD-10-CM | POA: Insufficient documentation

## 2019-05-04 DIAGNOSIS — R590 Localized enlarged lymph nodes: Secondary | ICD-10-CM | POA: Insufficient documentation

## 2019-05-04 LAB — GLUCOSE, CAPILLARY: Glucose-Capillary: 113 mg/dL — ABNORMAL HIGH (ref 70–99)

## 2019-05-04 MED ORDER — FLUDEOXYGLUCOSE F - 18 (FDG) INJECTION
16.0000 | Freq: Once | INTRAVENOUS | Status: AC
  Administered 2019-05-04: 10:00:00 16 via INTRAVENOUS

## 2019-05-08 ENCOUNTER — Other Ambulatory Visit (HOSPITAL_COMMUNITY): Payer: Self-pay | Admitting: Family Medicine

## 2019-05-08 DIAGNOSIS — R591 Generalized enlarged lymph nodes: Secondary | ICD-10-CM

## 2019-05-09 ENCOUNTER — Encounter (HOSPITAL_COMMUNITY): Payer: Self-pay | Admitting: Radiology

## 2019-05-09 DIAGNOSIS — E291 Testicular hypofunction: Secondary | ICD-10-CM | POA: Diagnosis not present

## 2019-05-09 DIAGNOSIS — R972 Elevated prostate specific antigen [PSA]: Secondary | ICD-10-CM | POA: Diagnosis not present

## 2019-05-09 NOTE — Progress Notes (Unsigned)
Shane Watson. Shane Watson, 73 y.o., 1946-04-21 MRN:  [id number] Phone:  [phone] [name]) PCP:  Shirline Frees, MD Primary Cvg:  Medicare/Medicare Part A And B Next Appt With Radiology ([facility]-CT 3) 05/11/2019 at 11:00 AM  RE: CT Biopsy Received: Yesterday Message Contents  Greggory Keen, MD  Jillyn Hidden        Ok for CT bx RP adenopathy   See PET 05/04/19    TS   Previous Messages  ----- Message -----  From: Garth Bigness D  Sent: 05/08/2019 12:28 PM EDT  To: [facility] Procedure Requests  Subject: CT Biopsy                     Procedure:  CT Biopsy   Reason: Lymphadenopathy,  Need for biopsy of retroperitoneal lymph nodes, PET scan consistent with lymphoma versus granulomatous disease   IMPRESSION:  1. 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  2. 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  3. Small pulmonary nodules do not have associated metabolic  activity. Recommend attention on follow-up.  4. Unfortunately, there are no large hypermetabolic supraclavicular  or inguinal/iliac nodes for biopsy. The hypermetabolic T7 lesion is  represented well on CT. The hypermetabolic LEFT iliac bone lesion is  not well depicted on CT.      Electronically Signed  By: Suzy Bouchard M.D.  On: 05/04/2019 13:06   History: CT, NM PET in computer   Dr. Shirline Frees  [phone]

## 2019-05-10 ENCOUNTER — Other Ambulatory Visit: Payer: Self-pay | Admitting: Student

## 2019-05-11 ENCOUNTER — Other Ambulatory Visit: Payer: Self-pay

## 2019-05-11 ENCOUNTER — Encounter (HOSPITAL_COMMUNITY): Payer: Self-pay

## 2019-05-11 ENCOUNTER — Ambulatory Visit (HOSPITAL_COMMUNITY)
Admission: RE | Admit: 2019-05-11 | Discharge: 2019-05-11 | Disposition: A | Discharge status: Home / Self Care | Payer: Medicare Other | Source: Ambulatory Visit | Attending: Family Medicine | Admitting: Family Medicine

## 2019-05-11 DIAGNOSIS — Z7982 Long term (current) use of aspirin: Secondary | ICD-10-CM | POA: Insufficient documentation

## 2019-05-11 DIAGNOSIS — M1990 Unspecified osteoarthritis, unspecified site: Secondary | ICD-10-CM | POA: Diagnosis not present

## 2019-05-11 DIAGNOSIS — Z8249 Family history of ischemic heart disease and other diseases of the circulatory system: Secondary | ICD-10-CM | POA: Insufficient documentation

## 2019-05-11 DIAGNOSIS — J449 Chronic obstructive pulmonary disease, unspecified: Secondary | ICD-10-CM | POA: Insufficient documentation

## 2019-05-11 DIAGNOSIS — Z833 Family history of diabetes mellitus: Secondary | ICD-10-CM | POA: Diagnosis not present

## 2019-05-11 DIAGNOSIS — Z87891 Personal history of nicotine dependence: Secondary | ICD-10-CM | POA: Insufficient documentation

## 2019-05-11 DIAGNOSIS — E1122 Type 2 diabetes mellitus with diabetic chronic kidney disease: Secondary | ICD-10-CM | POA: Insufficient documentation

## 2019-05-11 DIAGNOSIS — G4733 Obstructive sleep apnea (adult) (pediatric): Secondary | ICD-10-CM | POA: Insufficient documentation

## 2019-05-11 DIAGNOSIS — R591 Generalized enlarged lymph nodes: Secondary | ICD-10-CM | POA: Insufficient documentation

## 2019-05-11 DIAGNOSIS — Z9981 Dependence on supplemental oxygen: Secondary | ICD-10-CM | POA: Insufficient documentation

## 2019-05-11 DIAGNOSIS — N183 Chronic kidney disease, stage 3 unspecified: Secondary | ICD-10-CM | POA: Diagnosis not present

## 2019-05-11 DIAGNOSIS — I2510 Atherosclerotic heart disease of native coronary artery without angina pectoris: Secondary | ICD-10-CM | POA: Insufficient documentation

## 2019-05-11 DIAGNOSIS — E1140 Type 2 diabetes mellitus with diabetic neuropathy, unspecified: Secondary | ICD-10-CM | POA: Insufficient documentation

## 2019-05-11 DIAGNOSIS — Z9049 Acquired absence of other specified parts of digestive tract: Secondary | ICD-10-CM | POA: Insufficient documentation

## 2019-05-11 DIAGNOSIS — Z79899 Other long term (current) drug therapy: Secondary | ICD-10-CM | POA: Insufficient documentation

## 2019-05-11 DIAGNOSIS — K219 Gastro-esophageal reflux disease without esophagitis: Secondary | ICD-10-CM | POA: Insufficient documentation

## 2019-05-11 DIAGNOSIS — Z880 Allergy status to penicillin: Secondary | ICD-10-CM | POA: Diagnosis not present

## 2019-05-11 DIAGNOSIS — E785 Hyperlipidemia, unspecified: Secondary | ICD-10-CM | POA: Diagnosis not present

## 2019-05-11 DIAGNOSIS — Z96611 Presence of right artificial shoulder joint: Secondary | ICD-10-CM | POA: Insufficient documentation

## 2019-05-11 DIAGNOSIS — I129 Hypertensive chronic kidney disease with stage 1 through stage 4 chronic kidney disease, or unspecified chronic kidney disease: Secondary | ICD-10-CM | POA: Diagnosis not present

## 2019-05-11 DIAGNOSIS — Z886 Allergy status to analgesic agent status: Secondary | ICD-10-CM | POA: Insufficient documentation

## 2019-05-11 DIAGNOSIS — Z882 Allergy status to sulfonamides status: Secondary | ICD-10-CM | POA: Insufficient documentation

## 2019-05-11 DIAGNOSIS — N400 Enlarged prostate without lower urinary tract symptoms: Secondary | ICD-10-CM | POA: Insufficient documentation

## 2019-05-11 DIAGNOSIS — R590 Localized enlarged lymph nodes: Secondary | ICD-10-CM | POA: Diagnosis not present

## 2019-05-11 LAB — CBC
HCT: 44.1 % (ref 39.0–52.0)
Hemoglobin: 13.5 g/dL (ref 13.0–17.0)
MCH: 28.5 pg (ref 26.0–34.0)
MCHC: 30.6 g/dL (ref 30.0–36.0)
MCV: 93 fL (ref 80.0–100.0)
Platelets: 175 10*3/uL (ref 150–400)
RBC: 4.74 MIL/uL (ref 4.22–5.81)
RDW: 16.4 % — ABNORMAL HIGH (ref 11.5–15.5)
WBC: 6.5 10*3/uL (ref 4.0–10.5)
nRBC: 0 % (ref 0.0–0.2)

## 2019-05-11 LAB — PROTIME-INR
INR: 1.2 (ref 0.8–1.2)
Prothrombin Time: 14.6 seconds (ref 11.4–15.2)

## 2019-05-11 LAB — GLUCOSE, CAPILLARY: Glucose-Capillary: 115 mg/dL — ABNORMAL HIGH (ref 70–99)

## 2019-05-11 LAB — APTT: aPTT: 36 seconds (ref 24–36)

## 2019-05-11 MED ORDER — SODIUM CHLORIDE 0.9 % IV SOLN: INTRAVENOUS | Status: DC

## 2019-05-11 MED ORDER — FENTANYL CITRATE (PF) 100 MCG/2ML IJ SOLN
INTRAMUSCULAR | Status: AC | PRN
  Administered 2019-05-11: 12.5 ug via INTRAVENOUS

## 2019-05-11 MED ORDER — FENTANYL CITRATE (PF) 100 MCG/2ML IJ SOLN
INTRAMUSCULAR | Status: AC
  Filled 2019-05-11: qty 2

## 2019-05-11 MED ORDER — MIDAZOLAM HCL 2 MG/2ML IJ SOLN
INTRAMUSCULAR | Status: AC | PRN
  Administered 2019-05-11: 0.5 mg via INTRAVENOUS

## 2019-05-11 MED ORDER — MIDAZOLAM HCL 2 MG/2ML IJ SOLN
INTRAMUSCULAR | Status: AC
  Filled 2019-05-11: qty 2

## 2019-05-11 NOTE — Procedures (Signed)
RP adenopathy  S/p CT RP adenopathy core bx  No comp Stable ebl min Path pending Full report in pacs

## 2019-05-11 NOTE — Progress Notes (Addendum)
Pt assisted to side of bed to void.  Complains of pain in left groin area.  Larene Beach, Radiology PA and Dr. Annamaria Boots notified.  No orders given.  Pa to see pt. Prior to d/c.  Back stick site and left groin site look normal.

## 2019-05-11 NOTE — Progress Notes (Signed)
Pt. States that left groin pain has improved and able to ambulate at baseline.  No further complaints.

## 2019-05-11 NOTE — Progress Notes (Signed)
Radiology PA in to see pt.  OK to d/c home.

## 2019-05-11 NOTE — Discharge Instructions (Signed)
Needle Biopsy, Care After °This sheet gives you information about how to care for yourself after your procedure. Your health care provider may also give you more specific instructions. If you have problems or questions, contact your health care provider. °What can I expect after the procedure? °After the procedure, it is common to have soreness, bruising, or mild pain at the puncture site. This should go away in a few days. °Follow these instructions at home: °Needle insertion site care ° °· Wash your hands with soap and water before you change your bandage (dressing). If you cannot use soap and water, use hand sanitizer. °· Follow instructions from your health care provider about how to take care of your puncture site. This includes: °? When and how to change your dressing. °? When to remove your dressing. °· Check your puncture site every day for signs of infection. Check for: °? Redness, swelling, or pain. °? Fluid or blood. °? Pus or a bad smell. °? Warmth. °General instructions °· Return to your normal activities as told by your health care provider. Ask your health care provider what activities are safe for you. °· Do not take baths, swim, or use a hot tub until your health care provider approves. Ask your health care provider if you may take showers. You may only be allowed to take sponge baths. °· Take over-the-counter and prescription medicines only as told by your health care provider. °· Keep all follow-up visits as told by your health care provider. This is important. °Contact a health care provider if: °· You have a fever. °· You have redness, swelling, or pain at the puncture site that lasts longer than a few days. °· You have fluid, blood, or pus coming from your puncture site. °· Your puncture site feels warm to the touch. °Get help right away if: °· You have severe bleeding from the puncture site. °Summary °· After the procedure, it is common to have soreness, bruising, or mild pain at the puncture  site. This should go away in a few days. °· Check your puncture site every day for signs of infection, such as redness, swelling, or pain. °· Get help right away if you have severe bleeding from your puncture site. °This information is not intended to replace advice given to you by your health care provider. Make sure you discuss any questions you have with your health care provider. °Document Released: 11/20/2014 Document Revised: 09/17/2017 Document Reviewed: 07/19/2017 °Elsevier Patient Education © 2020 [employer] ° °

## 2019-05-11 NOTE — H&P (Signed)
Chief Complaint: Patient was seen in consultation today for retroperitoneal LN bx at the request of Harris,William  Referring Physician(s): Harris,William  Supervising Physician: Daryll Brod  Patient Status: [facility] - Out-pt  History of Present Illness: Shane Watson is a 73 y.o. male being worked up for RP lymphadenopathy. He underwent PET scan and is now scheduled for image guided biopsy of retroperitoneal LN. PMHx, meds, labs, imaging, allergies reviewed. He reports he is on home O2 only at night. Feels well, no recent fevers, chills, illness. Has been NPO today as directed.   Past Medical History:  Diagnosis Date   Allergy    Anemia    Arthritis    BPH (benign prostatic hyperplasia)    CKD (chronic kidney disease), stage III    Complication of anesthesia    COPD (chronic obstructive pulmonary disease) ([place])    a. Uses Home O2 w/ exertion.   Coronary artery disease    a. 11/2010 Cath: LM nl, LAD 51m (3.5x22 and 3.5x12 Resolute DES'), LCX nl, OM1/2/3 nl, RCA 20-30p, PDA nl.    Diabetes mellitus without complication (HCC)    GERD (gastroesophageal reflux disease)    H/O echocardiogram    a. per notes in 11/2010 - nl EF, basal inf wma.   Heart murmur    History of kidney stones    Hyperlipidemia    Hypertensive heart disease    Hyperthyroidism    Neuropathy    Neuropathy    OSA (obstructive sleep apnea)    a. Uses CPAP.   PONV (postoperative nausea and vomiting)    Vertigo     Past Surgical History:  Procedure Laterality Date   2 heart stents  12/18/10   CARDIAC CATHETERIZATION     CHOLECYSTECTOMY     COLONOSCOPY     REVERSE SHOULDER ARTHROPLASTY Right 10/08/2015   REVERSE SHOULDER ARTHROPLASTY Right 10/08/2015   Procedure: REVERSE SHOULDER ARTHROPLASTY;  Surgeon: Netta Cedars, MD;  Location: [place];  Service: Orthopedics;  Laterality: Right;   right knee surgery Right 2008ish   "scraped it out"    Allergies: Aspirin,  Ibuprofen, Penicillins, and Sulfa antibiotics  Medications: Prior to Admission medications   Medication Sig Start Date End Date Taking? Authorizing Provider  acetaminophen (TYLENOL) 500 MG tablet Take 500 mg by mouth every 8 (eight) hours as needed for moderate pain.    Yes [provider]  albuterol (PROAIR HFA) 108 (90 Base) MCG/ACT inhaler 2 puffs every 4 hours as needed only  if your can't catch your breath 07/26/18  Yes Fenton Foy, NP  albuterol (PROVENTIL) (5 MG/ML) 0.5% nebulizer solution Take 2.5 mg by nebulization every 4 (four) hours as needed for wheezing or shortness of breath (((PLAN B))).    Yes [provider]  aspirin EC 81 MG tablet Take 81 mg by mouth daily.   Yes [provider]  atorvastatin (LIPITOR) 40 MG tablet TAKE 1 TABLET BY MOUTH AT  BEDTIME 07/21/18  Yes Skains, Thana Farr, MD  budesonide-formoterol (SYMBICORT) 160-4.5 MCG/ACT inhaler INHALE 2 PUFFS BY MOUTH  FIRST THING IN THE MORNING  AND THEN ANOTHER 2 PUFFS  ABOUT 12 HOURS LATER Patient taking differently: Inhale 2 puffs into the lungs every morning. INHALE 2 PUFFS BY MOUTH  FIRST THING IN THE MORNING 04/04/19  Yes Tanda Rockers, MD  cetirizine (ZYRTEC) 10 MG tablet Take 10 mg by mouth daily as needed for allergies.    Yes [provider]  colesevelam (WELCHOL) 625 MG tablet  Take 625 mg by mouth 2 (two) times daily.   Yes [provider]  diltiazem (CARDIZEM SR) 90 MG 12 hr capsule Take 90 mg by mouth daily. 02/17/19  Yes [provider]  diltiazem (CARDIZEM) 60 MG tablet Take 60 mg by mouth 4 (four) times daily as needed (tachycardia).   Yes [provider]  diphenoxylate-atropine (LOMOTIL) 2.5-0.025 MG tablet Take 1 tablet by mouth 4 (four) times daily as needed for diarrhea or loose stools.  07/25/18  Yes [provider]  doxazosin (CARDURA) 8 MG tablet Take 8 mg by mouth at bedtime.   Yes [provider]  famotidine (PEPCID) 20 MG tablet  Take 20 mg by mouth at bedtime.   Yes [provider]  furosemide (LASIX) 20 MG tablet Take 2 tablets (40 mg total) by mouth daily. Please make yearly appt with Dr. [name] for November for future refills. 1st attempt 03/02/19  Yes Jerline Pain, MD  gabapentin (NEURONTIN) 300 MG capsule Take 1 capsule by mouth 2 (two) times daily. 07/25/18  Yes [provider]  hydrochlorothiazide (HYDRODIURIL) 25 MG tablet Take 12.5 mg by mouth daily.   Yes [provider]  JANUVIA 100 MG tablet Take 100 mg by mouth daily. 04/13/16  Yes [provider]  losartan (COZAAR) 100 MG tablet Take 100 mg by mouth daily.   Yes [provider]  meclizine (ANTIVERT) 12.5 MG tablet Take 25 mg by mouth daily.  02/05/16  Yes [provider]  Multiple Vitamin (MULTIVITAMIN WITH MINERALS) TABS tablet Take 1 tablet by mouth at bedtime.    Yes [provider]  [facility] VERIO test strip U UTD ONCE A DAY 07/21/18  Yes [provider]  oxybutynin (DITROPAN) 5 MG tablet Take 5 mg by mouth daily as needed for bladder spasms.   Yes [provider]  OXYGEN Inhale 2 L into the lungs at bedtime.   Yes [provider]  pantoprazole (PROTONIX) 40 MG tablet TAKE 1 TABLET BY MOUTH  DAILY 12/09/18  Yes Tanda Rockers, MD  potassium chloride SA (K-DUR,KLOR-CON) 20 MEQ tablet Take 1 tablet by mouth  daily 02/25/16  Yes Ria Bush, MD  Propylene Glycol (SYSTANE BALANCE) 0.6 % SOLN Place 2 drops into both eyes 2 (two) times daily.   Yes [provider]  Respiratory Therapy Supplies (FLUTTER) DEVI Use as directed 02/04/15  Yes Tanda Rockers, MD  SPIRIVA RESPIMAT 2.5 MCG/ACT AERS USE 2 SPRAYS(INHALATIONS)   BY MOUTH EVERY MORNING Patient taking differently: Inhale 2 sprays into the lungs every morning.  04/04/19  Yes Tanda Rockers, MD  nitroGLYCERIN (NITROSTAT) 0.4 MG SL tablet Place 1 tablet (0.4 mg total) under the tongue every 5 (five) minutes as  needed for chest pain. 11/14/18   Jerline Pain, MD  UNABLE TO FIND Med Name: CPAP with 2lpm o2 with sleep    [provider]     Family History  Problem Relation Age of Onset   Diabetes Mother    Heart disease Father    Diabetes Father    Heart disease Sister    Diabetes Sister     Social History   Socioeconomic History   Marital status: Single    Spouse name: Not on file   Number of children: Not on file   Years of education: Not on file   Highest education level: Not on file  Occupational History   Occupation: Retired    Comment: Academic librarian  Social Needs  Financial resource strain: Not on file   Food insecurity    Worry: Not on file    Inability: Not on file   Transportation needs    Medical: Not on file    Non-medical: Not on file  Tobacco Use   Smoking status: Former Smoker    Packs/day: 1.50    Years: 15.00    Pack years: 22.50    Types: Cigarettes    Quit date: 04/29/1987    Years since quitting: 32.0   Smokeless tobacco: Never Used  Substance and Sexual Activity   Alcohol use: Yes    Comment: Rare   Drug use: No   Sexual activity: Not on file  Lifestyle   Physical activity    Days per week: Not on file    Minutes per session: Not on file   Stress: Not on file  Relationships   Social connections    Talks on phone: Not on file    Gets together: Not on file    Attends religious service: Not on file    Active member of club or organization: Not on file    Attends meetings of clubs or organizations: Not on file    Relationship status: Not on file  Other Topics Concern   Not on file  Social History Narrative   Not on file     Review of Systems: A 12 point ROS discussed and pertinent positives are indicated in the HPI above.  All other systems are negative.  Review of Systems  Vital Signs: BP (!) 157/71    Pulse 79    Temp 98.4 F (36.9 C) (Skin)    Resp 14    Ht 5\' 11"  (1.803 m)    Wt (!) 154.2 kg    SpO2 91%     BMI 47.42 kg/m   Physical Exam Constitutional:      Appearance: Normal appearance. He is obese. He is not ill-appearing.  HENT:     Head: Normocephalic.     Mouth/Throat:     Mouth: Mucous membranes are moist.     Pharynx: Oropharynx is clear.  Cardiovascular:     Rate and Rhythm: Normal rate and regular rhythm.     Heart sounds: Normal heart sounds.  Pulmonary:     Effort: Pulmonary effort is normal. No respiratory distress.     Breath sounds: Normal breath sounds.  Skin:    General: Skin is warm and dry.  Neurological:     General: No focal deficit present.     Mental Status: He is alert and oriented to person, place, and time.  Psychiatric:        Mood and Affect: Mood normal.        Judgment: Judgment normal.     Imaging: Ct Abdomen Pelvis W Contrast  Result Date: 05/01/2019 CLINICAL DATA:  2 week history of right upper quadrant pain. EXAM: CT ABDOMEN AND PELVIS WITH CONTRAST TECHNIQUE: Multidetector CT imaging of the abdomen and pelvis was performed using the standard protocol following bolus administration of intravenous contrast. CONTRAST:  133mL ISOVUE-300 IOPAMIDOL (ISOVUE-300) INJECTION 61% COMPARISON:  01/17/2018 FINDINGS: Lower chest: Tiny pulmonary nodules in the posterior right lung base measuring 6 mm on images 1 and 8 of series 3 are stable in the interval, likely benign. Atelectasis or scarring is noted in the lung bases bilaterally. Hepatobiliary: No suspicious focal abnormality within the liver parenchyma. Gallbladder surgically absent. No intrahepatic or extrahepatic biliary dilation. Pancreas: No focal mass lesion. No dilatation  of the main duct. No intraparenchymal cyst. No peripancreatic edema. Spleen: No splenomegaly. No focal mass lesion. Adrenals/Urinary Tract: No adrenal nodule or mass. Right kidney unremarkable. Similar appearance of a 6.5 cm interpolar left renal cyst. Smaller low-density 14 mm interpolar left renal lesion is also stable. No evidence for  hydronephrosis. No evidence for hydroureter. Bladder is decompressed. Stomach/Bowel: Stomach is unremarkable. No gastric wall thickening. No evidence of outlet obstruction. Duodenum is normally positioned as is the ligament of Treitz. No small bowel wall thickening. No small bowel dilatation. The terminal ileum is normal. The appendix is not visualized, but there is no edema or inflammation in the region of the cecum. No gross colonic mass. No colonic wall thickening. Vascular/Lymphatic: There is abdominal aortic atherosclerosis without aneurysm. Interval development of lymphadenopathy in the lower mediastinum and abdomen. 13 mm right retrocrural node seen on 12/02. 2.4 cm short axis portal caval node visible on 30/2. 2.3 cm short axis aortocaval node visible on 38/2. 2.8 cm left para-aortic node identified on 50/2. Small lymph nodes are seen in the common iliac chains bilaterally. 15 mm short axis left common femoral node visible on 77/2. Reproductive: Prostate gland is enlarged. Other: No intraperitoneal free fluid. Musculoskeletal: Right groin hernia contains only fat. Multiple new subtle lucent lesions are identified in the bony anatomy including a 13 mm lesion in the L1 vertebral body (32/2), 1.9 cm lesion in the L2 vertebral body (40/2), subtle cortical lesion in the right iliac crest (59/2) and 10 mm sacral lesion on 69/2. IMPRESSION: 1. Lower mediastinal and abdominal/pelvic lymphadenopathy. This could be related to lymphoma or metastatic disease, but given the interval development of multiple new lucent bone lesions, metastatic disease is favored. No evidence for a primary lesion on the current exam. 2.  Aortic Atherosclerois (ICD10-170.0) 3. Tiny pulmonary nodules in the right lung base are stable since prior study, suggesting benign etiology. These results will be called to the ordering clinician or representative by the Radiologist Assistant, and communication documented in the PACS or zVision Dashboard.  Electronically Signed   By: Misty Stanley M.D.   On: 05/01/2019 17:26   Nm Pet Image Initial (pi) Skull Base To Thigh  Result Date: 05/04/2019 CLINICAL DATA:  Initial staging, abdominal lymphadenopathy treatment strategy for abdominal lymphadenopathy. EXAM: NUCLEAR MEDICINE PET SKULL BASE TO THIGH TECHNIQUE: Sixteen mCi F-18 FDG was injected intravenously. Full-ring PET imaging was performed from the skull base to thigh after the radiotracer. CT data was obtained and used for attenuation correction and anatomic localization. Fasting blood glucose: 113 mg/dl COMPARISON:  None. FINDINGS: Mediastinal blood pool activity: SUV max 1.8 Liver activity: SUV max NA NECK: No hypermetabolic lymph nodes in the neck. Incidental CT findings: none CHEST: Cluster prevascular lymph nodes. Example lymph node measures 0.6 cm prevascular space with SUV max 10.7. Nodes extend to the high LEFT paratracheal nodal station but no clear supraclavicular hypermetabolic nodes. No hypermetabolic axillary nodes Incidental CT findings: Small RIGHT pleural effusion. Mild RIGHT basilar atelectasis. Nodule along the RIGHT pleural surface measures 8 mm (image 11/9 without metabolic activity. Second nodule along the posterior aspect of the LEFT upper lobe against the fissure measures 6 mm (image 57/4 also without metabolic activity. ABDOMEN/PELVIS: Cluster hypermetabolic lymph nodes in the retroperitoneum along the aorta from the renal veins to the iliac arteries. Example LEFT aorta SUV max equal 6.2 measures 2 cm (image 143/4. Posterior the aorta SUV max equal 7.6. Noted within the mesentery on the SMA adjacent to the uncinate of  the pancreas measures 2.2 cm SUV max 10.6. Smaller hypermetabolic nodes extend along the common iliac arteries. No hypermetabolic external iliac nodes or inguinal nodes. Spleen is normal metabolic activity normal volume. No abnormal activity in liver. Incidental CT findings: There is a lesion in the SKELETON: Along the  posterior aspect of the T7 vertebral body there is a lytic lesion measuring 2 cm (image 80/4) which has mild metabolic activity (SUV max equal 6.0.) This lesion is new from CT September 22, 7320) Focal metabolic activity in the posterior LEFT iliac bone with SUV max equal 6.9. No clear CT abnormality at this level. Lytic lesion in the L1 vertebral body with mild metabolic activity (SUV max equal 5.7. Incidental CT findings: none IMPRESSION: 1. Hypermetabolic mediastinal prevascular nodes and hypermetabolic retroperitoneal and mesenteric nodes consistent with lymphoma versus granulomatous disease (sarcoidosis). 2. Lytic lesion in the T7 vertebral body with moderate metabolic activity is concerning for skeletal metastasis versus sarcoid lesion. 3. Small pulmonary nodules do not have associated metabolic activity. Recommend attention on follow-up. 4. Unfortunately, there are no large hypermetabolic supraclavicular or inguinal/iliac nodes for biopsy. The hypermetabolic T7 lesion is represented well on CT. The hypermetabolic LEFT iliac bone lesion is not well depicted on CT. Electronically Signed   By: Suzy Bouchard M.D.   On: 05/04/2019 13:06    Labs:  CBC: Recent Labs    05/11/19 0914  WBC 6.5  HGB 13.5  HCT 44.1  PLT 175    COAGS: Recent Labs    05/11/19 0914  INR 1.2  APTT 36    Assessment and Plan: Adenopathy For CT guided biopsy of RP LN Labs pending Risks and benefits of RP LN biopsy was discussed with the patient and/or patient's family including, but not limited to bleeding, infection, damage to adjacent structures or low yield requiring additional tests.  All of the questions were answered and there is agreement to proceed.  Consent signed and in chart.    Thank you for this interesting consult.  I greatly enjoyed meeting Shane Watson and look forward to participating in their care.  A copy of this report was sent to the requesting provider on this date.  Electronically  Signed: Ascencion Dike, PA-C 05/11/2019, 10:52 AM   I spent a total of 20 minutes in face to face in clinical consultation, greater than 50% of which was counseling/coordinating care for RP LN bx

## 2019-05-11 NOTE — Progress Notes (Signed)
S: Patient post CT guided left retroperitoneal lymph node biopsy earlier today with complaints of 10/10 left groin pain which began upon ambulating to the bathroom. Per RN patient with severe pain and difficulty walking due to the pain.  Patient seen in short stay, laying on his right side - reports pain has improved to 6/10 now but is still present. Pain present to left groin and left testicle, does not radiate to the lower leg or foot. He denies any numbness, tingling or difficulty moving his leg/foot. The pain is worsened with bending of the leg at the knee. He has some mobility issues at baseline. He reports some soreness to back at puncture site which is minimal.   O: Groin and left testicle palpated - unable to reproduce pain. No edema, erythema, wound or rash present. Grossly neurologically intact on exam; unable to palpate distal pulses on either leg due to significant pitting edema.  A:   Post procedure left groin - likely referred pain post biopsy. No indication for further imaging at this time. Discussed use of NSAIDs at home for inflammation as well as rest. Encouraged patient to continue to monitor pain level and if pain worsens or new symptoms develop he was instructed to present to the ED for evaluation to which he agreed. Patient safe for discharge home at this time.   Candiss Norse, PA-C

## 2019-05-15 ENCOUNTER — Ambulatory Visit (INDEPENDENT_AMBULATORY_CARE_PROVIDER_SITE_OTHER): Payer: Medicare Other | Admitting: Internal Medicine

## 2019-05-15 ENCOUNTER — Other Ambulatory Visit: Payer: Self-pay

## 2019-05-15 ENCOUNTER — Encounter: Payer: Self-pay | Admitting: Internal Medicine

## 2019-05-15 DIAGNOSIS — J449 Chronic obstructive pulmonary disease, unspecified: Secondary | ICD-10-CM

## 2019-05-15 DIAGNOSIS — J9612 Chronic respiratory failure with hypercapnia: Secondary | ICD-10-CM

## 2019-05-15 DIAGNOSIS — K589 Irritable bowel syndrome without diarrhea: Secondary | ICD-10-CM | POA: Insufficient documentation

## 2019-05-15 DIAGNOSIS — K588 Other irritable bowel syndrome: Secondary | ICD-10-CM | POA: Diagnosis not present

## 2019-05-15 MED ORDER — PREDNISONE 10 MG PO TABS: ORAL_TABLET | ORAL | 0 refills | Status: DC

## 2019-05-15 NOTE — Progress Notes (Addendum)
Subjective:   Patient ID: Shane Watson, male    DOB: [birth year]   MRN: [id number]    Brief patient profile:  32 yowm  Quit smoking 1987 at wt = 240   Referred 05/02/2013 by Shane Watson for low 02. 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  Uses alb seems to help some with assoc subj wheeze rec Breo one puff daily automatically to see if breathing improves Please see patient coordinator before you leave today  to schedule overnight 02 sat on cpap> rx 2lpm     07/28/2017 acute extended ov/Shane Watson re: cough x Jan 6th / sore throat / worse p 4pm Chief Complaint  Patient presents with  . Acute Visit    Pt c/o cough with brown to yellow sputum, wheezing, increased SOB and chest tightness for the past 4 days. He has used his rescue inhaler once since onset of these symptoms. He has not used his neb. Still on study drug inhaler and off of symb and spiriva.   not sure what his rescue hfa is but gets it from study and has neb but has not used it  Was doing well until abrupt onset cough /chest congestion 4 d prior to OV   Cough esp prod in ams with assoc nasal congestion  rec zpak Prednisone 10 mg take  4 each am x 2 days,   2 each am x 2 days,  1 each am x 2 days and stop  For cough > mucinex dm up to 1200 mg every 12 hours and use flutter valve as much as you can For short of breath/ wheeze >> Please remember to go to the  x-ray department downstairs in the basement  for your tests - we will call you with the results when  they are available.    07/26/17 Shane Watson for aecopd rec Will order doxycycline Will order prednisone taper Continue mucinex May take delsym xopenex neb given in office today Follow up with Dr. Melvyn Watson at his first available appointment or sooner if symptoms worsen      08/11/2018  f/u ov/Shane Watson re: aecopd / improved though could not swallow doxy Chief Complaint  Patient presents with  . Follow-up    Acute bronchitis with COPD  Dyspnea: back to baseline =   MMRC3 = can't walk 100 yards even at a slow pace at a flat grade s stopping due to sob  eg pushing cart at [employer] and then back bothers him  Cough: no Sleeping: on bed bed flat/ cpap SABA use: not much now in hfa/ no longer using neb as rescue  02: 2lpm per cpap o/w not using 02  rec Water aerobics   Shane Watson  02/09/19 Continue Symbicort Continue Spiriva Continue albuterol Continue flutter valve as needed Stay active - may start water aerobics when gym reopens   05/15/2019  Acute  ov/Shane Watson re:  GOLD II AB with new RUQ pain and bloating / constipation  on spiriva  Chief Complaint  Patient presents with  . Acute Visit    Increased SOB x 1 wk. Feels like he is unable to take a good, deep breath in. He has sharp pain on his right side.   Dyspnea:  Can still push cart at [employer] and rests every aisle or two Cough: not now  Sleeping: on right side/ one pillow on cpap per pcp SABA use: 3 x per week, no  Neb x one month 02: 2lpm with cpap only  - sats in higher 80-lower 90s with activity = baseline  ruq pain never present when supine or on side, mostly when sitting, assoc constipation and bloating and wt gain W/u for lymphadenopthy > bx per Shane Watson 05/11/2019 still pending    No obvious day to day or daytime variability or assoc excess/ purulent sputum or mucus plugs or hemoptysis or cp or chest tightness, subjective wheeze or overt sinus or hb symptoms.   Sleeping as above without nocturnal  or early am exacerbation  of  respiratory  c/o's or need for noct saba. Also denies any obvious fluctuation of symptoms with weather or environmental changes or other aggravating or alleviating factors except as outlined above   No unusual exposure hx or h/o childhood pna/ asthma or knowledge of premature birth.  Current Allergies, Complete Past Medical History, Past Surgical History, Family History, and Social History were reviewed in [employer] record.  ROS  The following are not active complaints unless bolded Hoarseness, sore throat, dysphagia, dental problems, itching, sneezing,  nasal congestion or discharge of excess mucus or purulent secretions, ear ache,   fever, chills, sweats, unintended wt loss or wt gain, classically pleuritic or exertional cp,  orthopnea pnd or arm/hand swelling  or leg swelling, presyncope, palpitations, abdominal pain, anorexia, nausea, vomiting, diarrhea  or change in bowel habits or change in bladder habits, change in stools or change in urine, dysuria, hematuria,  rash, arthralgias, visual complaints, headache, numbness, weakness or ataxia or problems with walking or coordination,  change in mood or  memory.        Current Meds  Medication Sig  . acetaminophen (TYLENOL) 500 MG tablet Take 500 mg by mouth every 8 (eight) hours as needed for moderate pain.   Marland Kitchen albuterol (PROAIR HFA) 108 (90 Base) MCG/ACT inhaler 2 puffs every 4 hours as needed only  if your can't catch your breath  . albuterol (PROVENTIL) (5 MG/ML) 0.5% nebulizer solution Take 2.5 mg by nebulization every 4 (four) hours as needed for wheezing or shortness of breath (((PLAN B))).   Marland Kitchen aspirin EC 81 MG tablet Take 81 mg by mouth daily.  Marland Kitchen atorvastatin (LIPITOR) 40 MG tablet TAKE 1 TABLET BY MOUTH AT  BEDTIME  . budesonide-formoterol (SYMBICORT) 160-4.5 MCG/ACT inhaler INHALE 2 PUFFS BY MOUTH  FIRST THING IN THE MORNING  AND THEN ANOTHER 2 PUFFS  ABOUT 12 HOURS LATER (Patient taking differently: Inhale 2  puffs into the lungs every morning. INHALE 2 PUFFS BY MOUTH  FIRST THING IN THE MORNING)  . cetirizine (ZYRTEC) 10 MG tablet Take 10 mg by mouth daily as needed for allergies.   . colesevelam (WELCHOL) 625 MG tablet Take 625 mg by mouth 2 (two) times daily.  Marland Kitchen diltiazem (CARDIZEM SR) 90 MG 12 hr capsule Take 90 mg by mouth daily.  Marland Kitchen diltiazem (CARDIZEM) 60 MG tablet Take 60 mg by mouth 4 (four) times daily as needed (tachycardia).  . diphenoxylate-atropine (LOMOTIL) 2.5-0.025 MG  tablet Take 1 tablet by mouth 4 (four) times daily as needed for diarrhea or loose stools.   . doxazosin (CARDURA) 8 MG tablet Take 8 mg by mouth at bedtime.  . famotidine (PEPCID) 20 MG tablet Take 20 mg by mouth at bedtime.  . furosemide (LASIX) 20 MG tablet Take 2 tablets (40 mg total) by mouth daily. Please make yearly appt with Dr. Marlou Watson for November for future refills. 1st attempt  . gabapentin (NEURONTIN) 300 MG capsule Take 1 capsule by mouth 2 (two) times daily.  . hydrochlorothiazide (HYDRODIURIL) 25 MG tablet Take 12.5 mg by mouth daily.  Marland Kitchen JANUVIA 100 MG tablet Take 100 mg by mouth daily.  Marland Kitchen losartan (COZAAR) 100 MG tablet Take 100 mg by mouth daily.  . meclizine (ANTIVERT) 12.5 MG tablet Take 25 mg by mouth daily.   . Multiple Vitamin (MULTIVITAMIN WITH MINERALS) TABS tablet Take 1 tablet by mouth at bedtime.   . nitroGLYCERIN (NITROSTAT) 0.4 MG SL tablet Place 1 tablet (0.4 mg total) under the tongue every 5 (five) minutes as needed for chest pain.  Glory Rosebush VERIO test strip U UTD ONCE A DAY  . oxybutynin (DITROPAN) 5 MG tablet Take 5 mg by mouth daily as needed for bladder spasms.  . OXYGEN Inhale 2 L into the lungs at bedtime.  . pantoprazole (PROTONIX) 40 MG tablet TAKE 1 TABLET BY MOUTH  DAILY  . potassium chloride SA (K-DUR,KLOR-CON) 20 MEQ tablet Take 1 tablet by mouth  daily  . Propylene Glycol (SYSTANE BALANCE) 0.6 % SOLN Place 2 drops into both eyes 2 (two) times daily.  Marland Kitchen Respiratory Therapy  Supplies (FLUTTER) DEVI Use as directed  . SPIRIVA RESPIMAT 2.5 MCG/ACT AERS USE 2 SPRAYS(INHALATIONS)   BY MOUTH EVERY MORNING (Patient taking differently: Inhale 2 sprays into the lungs every morning. )  . UNABLE TO FIND Med Name: CPAP with 2lpm o2 with sleep                    Objective:   Physical Exam   05/15/2019    348  08/10/2013      352 > 350 08/25/2013 > 10/17/2013 351 > 01/15/2014  345 > 05/08/2014 345 > 08/06/2014  354 > 02/04/2015 354 > 02/25/2015  354 > 05/17/2015 351 > 05/27/2015  349 > 08/27/2015  335 > 02/24/2016   315 > 08/27/2016  323 > 03/29/2017  336 > 07/28/2017    331 >  08/11/2018     338    tensely obese amb wm   Vital signs reviewed - Note on arrival 02 sats  92% on RA   HEENT : pt wearing mask not removed for exam due to covid - 19 concerns.   NECK :  without JVD/Nodes/TM/ nl carotid upstrokes bilaterally   LUNGS: no acc muscle use,  Min barrel  contour chest wall with bilateral  slightly decreased bs s audible wheeze and  without cough on insp or exp maneuvers and min  Hyperresonant  to  percussion bilaterally     CV:  RRR  no s3 or murmur or increase in P2, and wearing tight elastic hose   ABD:  Tensely obese  with pos end  insp Hoover's  in the supine position. No bruits or organomegaly appreciated, bowel sounds nl  MS:   Slow  gait/  ext warm without deformities, calf tenderness, cyanosis or clubbing No obvious joint restrictions   SKIN: warm and dry without lesions    NEURO:  alert, approp, nl sensorium with  no motor or cerebellar deficits apparent.          I personally reviewed images and agree with radiology impression as follows:   Chest CT cuts on abd ct 05/01/2019 Lower chest: Tiny pulmonary nodules in the posterior right lung base measuring 6 mm on images 1 and 8 of series 3 are stable in the interval, likely benign. Atelectasis or scarring is noted in the lung bases bilaterally.       Assessment & Plan:

## 2019-05-15 NOTE — Assessment & Plan Note (Signed)
Quit smoking 1980  - started breo 05/02/13 > d/c 06/06/13 and replaced with symbicort   - PFT's 06/06/13  FEV1 1.59 (47%) ratio 62 and 13% better despite  breo am ov test, DLC0 82 corrects to 113% - med calendar    08/25/2013   - flutter valve added 02/04/15  - 02/25/2015  Walked RA x 3 laps @ 185 ft each stopped due to End of study, nl pace, no desat  / min sob/ some hip and leg pain  - referred to rehab 02/25/2015   - re-inforced flutter valve 05/27/2015  08/11/2018  After extensive coaching inhaler device,  effectiveness =    90% - PFT's  02/09/2019 FEV1 2.01 (63 % ) ratio 0.74  p 15 % improvement from saba p nothing prior to study with DLCO  25.93 (101%) corrects to 4.66 (116%)  for alv volume and FV curve mild concavity    - 05/15/2019  After extensive coaching inhaler device,  effectiveness =   75%  (short Ti)    Despite short Ti relatively well compensated and no real change in ex tol with minimal copd though may have element of AB   Also may have intol to LAMA with bloating / constipation so try off to see benefit or not    I had an extended discussion with the patient reviewing all relevant studies completed to date and  lasting 15 to 20 minutes of a 25 minute visit    I performed detailed device teaching using a teach back method which extended face to face time for this visit (see above)  Each maintenance medication was reviewed in detail including emphasizing most importantly the difference between maintenance and prns and under what circumstances the prns are to be triggered using an action plan format that is not reflected in the computer generated alphabetically organized AVS which I have not found useful in most complex patients, especially with respiratory illnesses  Please see AVS for specific instructions unique to this visit that I personally wrote and verbalized to the the pt in detail and then reviewed with pt  by my nurse highlighting any  changes in therapy recommended at  today's visit to their plan of care.

## 2019-05-15 NOTE — Assessment & Plan Note (Signed)
PFTs 02/09/19   ERV 6% c/w effects of obesity   Body mass index is 48.54 kg/m.  -  trending up  Lab Results  Component Value Date   TSH 0.82 02/04/2015     Contributing to gerd risk/ doe/reviewed the need and the process to achieve and maintain neg calorie balance > defer f/u primary care including intermittently monitoring thyroid status

## 2019-05-15 NOTE — Patient Instructions (Addendum)
Classic subdiaphragmatic pain pattern suggests ibs:   very limited distribution of pain locations, daytime, not usually exacerbated by exercise   worse in sitting position, frequently associated with generalized abd bloating, not as likely to be present supine due to the dome effect of the diaphragm which  is  canceled in that position. Frequently these patients have had multiple negative GI workups and CT scans.  Treatment consists of avoiding foods that cause gas (especially boiled eggs, mexcican food but especially  beans and undercooked vegetables like  spinach and some salads)  and Citrucel 1 heaping tsp twice daily with a large glass of water.  Pain should improve w/in 2 weeks and if not then consider further GI work up.      Prednisone 10 mg take  4 each am x 2 days,   2 each am x 2 days,  1 each am x 2 days and stop   Work on inhaler technique:  relax and gently blow all the way out then take a nice smooth deep breath back in, triggering the inhaler at same time you start breathing in.  Hold for up to 5 seconds if you can. Blow symbicort out thru nose. Rinse and gargle with water when done      Stop spiriva to see if you can tell a difference in your pain or breathing (think of it like high octane fuel)   We need to see you when you are low on your symbicort supply to consider a change to [facility] or Trelegy

## 2019-05-15 NOTE — Assessment & Plan Note (Signed)
-   HCO3 33 02/04/15  - 02/25/2015 no desat's walking  - 08/27/2015  Walked RA x 3 laps @ 185 ft each stopped due to  End of study, nl pace, no sob or desat     rec as of 05/15/2019 02 2lpm with cpap only at bedtime   sats in upper 80s acceptable given hypercarbia, no change in rx needed

## 2019-05-15 NOTE — Assessment & Plan Note (Signed)
rx with citrucel/ diet 05/15/2019   I doubt there adenopathy explains the abd pain: Classic subdiaphragmatic pain pattern suggests ibs:  Stereotypical, migratory with a very limited distribution of pain locations, daytime, not usually exacerbated by exercise  or coughing, worse in sitting position, frequently associated with generalized abd bloating, not as likely to be present supine due to the dome effect of the diaphragm which  is  canceled in that position. Frequently these patients have had multiple negative GI workups and CT scans.  Treatment consists of avoiding foods that cause gas (especially boiled eggs, mexcican food but especially  beans and undercooked vegetables like  spinach and some salads)  and citrucel 1 heaping tsp twice daily with a large glass of water.  Pain should improve w/in 2 weeks if this is ibs and may be better off spiriva (see copd)

## 2019-05-16 ENCOUNTER — Telehealth: Payer: Self-pay | Admitting: Internal Medicine

## 2019-05-16 DIAGNOSIS — J438 Other emphysema: Secondary | ICD-10-CM

## 2019-05-16 DIAGNOSIS — J449 Chronic obstructive pulmonary disease, unspecified: Secondary | ICD-10-CM

## 2019-05-16 MED ORDER — ALBUTEROL SULFATE (5 MG/ML) 0.5% IN NEBU: 2.5000 mg | INHALATION_SOLUTION | RESPIRATORY_TRACT | 11 refills | Status: AC | PRN

## 2019-05-16 NOTE — Telephone Encounter (Signed)
Spoke with the pt  He is asking for albuterol neb sol to be sent to [facility]  Rx printed and given to [facility] and DME order placed

## 2019-05-17 LAB — SURGICAL PATHOLOGY

## 2019-05-22 ENCOUNTER — Ambulatory Visit (HOSPITAL_COMMUNITY)
Admission: EM | Admit: 2019-05-22 | Discharge: 2019-05-22 | Disposition: A | Discharge status: Home / Self Care | Payer: Medicare Other | Source: Home / Self Care

## 2019-05-22 ENCOUNTER — Other Ambulatory Visit: Payer: Self-pay

## 2019-05-22 ENCOUNTER — Encounter (HOSPITAL_COMMUNITY): Payer: Self-pay | Admitting: Family Medicine

## 2019-05-22 ENCOUNTER — Telehealth: Payer: Self-pay | Admitting: Hematology

## 2019-05-22 DIAGNOSIS — C799 Secondary malignant neoplasm of unspecified site: Secondary | ICD-10-CM

## 2019-05-22 MED ORDER — POLYETHYLENE GLYCOL 3350 17 GM/SCOOP PO POWD: ORAL | 0 refills | Status: AC

## 2019-05-22 MED ORDER — OXYCODONE-ACETAMINOPHEN 5-325 MG PO TABS: 2.0000 | ORAL_TABLET | ORAL | 0 refills | Status: AC | PRN

## 2019-05-22 NOTE — Telephone Encounter (Signed)
Confirmed with patient 11/10 new patient appointment with Dr. Irene Limbo.

## 2019-05-22 NOTE — ED Triage Notes (Signed)
Pt c/o back pain, states hes had several scans, CT,s PET scans, and has an appt with oncology next week. Pt states he was given tramadol for the pain and it doesn't help.

## 2019-05-22 NOTE — ED Notes (Signed)
Patient verbalizes understanding of discharge instructions. Opportunity for questioning and answers were provided. Patient discharged from [facility] by provider.  

## 2019-05-22 NOTE — ED Provider Notes (Signed)
Shane Watson    CSN: [id number] Arrival date & time: 05/22/19  1251      History   Chief Complaint Chief Complaint  Patient presents with  . Back Pain    HPI Shane Watson is a 73 y.o. male.   This is an initial visit for this 73 year old gentleman complaining of left-sided back pain.  His past medical history is significant for quite a few significant problems: Coronary artery disease, COPD, chronic kidney disease, kidney stones, and diabetes.    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  He was subsequently seen by his pulmonologist Dr. [name].  He has been experiencing increasing pain for a week.  He has called his PCP multiple times, and today was told he has an oncology appt next Tuesday and pain medicine would be called in today.   Note from 10/26 Dr. [name]: Classic subdiaphragmatic pain pattern suggests ibs:   very limited distribution of pain locations, daytime, not usually exacerbated by exercise   worse in sitting position, frequently associated with generalized abd bloating, not as likely to be present supine due to the dome effect of the diaphragm which  is  canceled in that position. Frequently these patients have had multiple negative GI workups and CT scans.  Treatment consists of avoiding foods that cause gas (especially boiled eggs, mexcican food but especially  beans and undercooked vegetables like  spinach and some salads)  and Citrucel 1 heaping tsp twice daily with a large glass of water.  Pain should improve w/in 2 weeks and if not then consider further GI work up.       Past Medical History:  Diagnosis Date  . Allergy   . Anemia   . Arthritis   . BPH (benign prostatic hyperplasia)   . CKD (chronic kidney disease), stage III   . Complication of anesthesia   . COPD (chronic obstructive pulmonary disease) ([place])    a. Uses Home O2 w/ exertion.  . Coronary artery disease     a. 11/2010 Cath: LM nl, LAD 73m (3.5x22 and 3.5x12 Resolute DES'), LCX nl, OM1/2/3 nl, RCA 20-30p, PDA nl.   . Diabetes mellitus without complication ([place])   . GERD (gastroesophageal reflux disease)   . H/O echocardiogram    a. per notes in 11/2010 - nl EF, basal inf wma.  . Heart murmur   . History of kidney stones   . Hyperlipidemia   . Hypertensive heart disease   . Hyperthyroidism   . Neuropathy   . Neuropathy   . OSA (obstructive sleep apnea)    a. Uses CPAP.  Marland Kitchen PONV (postoperative nausea and vomiting)   . Vertigo     Patient Active Problem List   Diagnosis Date Noted  . IBS (irritable bowel syndrome) 05/15/2019  . Acute bronchitis with COPD ([place]) 07/27/2018  . Abnormal CT scan of lung 03/30/2017  . Morbid obesity ([place]) 10/08/2015  . Lower extremity edema 10/08/2015  . S/P shoulder replacement 10/08/2015  . Fracture, humerus, proximal 10/07/2015  . Coronary artery disease   . Hypertensive heart disease   . CKD (chronic kidney disease), stage III   . Hyperlipidemia   . COPD (chronic obstructive pulmonary disease) ([place])   . Morbid obesity due to excess calories ([place]) 02/25/2015  . Dyspnea 02/04/2015  . CKD (chronic kidney disease) 10/09/2014  . Coronary artery disease due to lipid rich plaque 10/09/2014  . Chronic respiratory failure with hypercapnia (  [place]) 05/08/2014  . Cellulitis of left lower extremity 02/20/2014  . Leukocytosis, unspecified 02/20/2014  . COPD exacerbation ([place]) 02/20/2014  . Dyslipidemia 02/20/2014  . CKD (chronic kidney disease) stage 4, GFR 15-29 ml/min ([facility]) 02/20/2014  . Multiple pulmonary nodules 11/01/2013  . Asthmatic bronchitis , chronic ([facility])   FEV1/FVC nl p bronchodilators  05/03/2013  . Obstructive sleep apnea 05/02/2013    Past Surgical History:  Procedure Laterality Date  . 2 heart stents  12/18/10  . CARDIAC CATHETERIZATION    . CHOLECYSTECTOMY    . COLONOSCOPY    . REVERSE SHOULDER ARTHROPLASTY Right 10/08/2015  . REVERSE  SHOULDER ARTHROPLASTY Right 10/08/2015   Procedure: REVERSE SHOULDER ARTHROPLASTY;  Surgeon: Netta Cedars, MD;  Location: [facility];  Service: Orthopedics;  Laterality: Right;  . right knee surgery Right 2008ish   "scraped it out"       Home Medications    Prior to Admission medications   Medication Sig Start Date End Date Taking? Authorizing Provider  acetaminophen (TYLENOL) 500 MG tablet Take 500 mg by mouth every 8 (eight) hours as needed for moderate pain.     [provider]  albuterol (PROAIR HFA) 108 (90 Base) MCG/ACT inhaler 2 puffs every 4 hours as needed only  if your can't catch your breath 07/26/18   Fenton Foy, NP  albuterol (PROVENTIL) (5 MG/ML) 0.5% nebulizer solution Take 0.5 mLs (2.5 mg total) by nebulization every 4 (four) hours as needed for wheezing or shortness of breath (((PLAN B))). 05/16/19   Tanda Rockers, MD  aspirin EC 81 MG tablet Take 81 mg by mouth daily.    [provider]  atorvastatin (LIPITOR) 40 MG tablet TAKE 1 TABLET BY MOUTH AT  BEDTIME 07/21/18   Jerline Pain, MD  budesonide-formoterol (SYMBICORT) 160-4.5 MCG/ACT inhaler INHALE 2 PUFFS BY MOUTH  FIRST THING IN THE MORNING  AND THEN ANOTHER 2 PUFFS  ABOUT 12 HOURS LATER Patient taking differently: Inhale 2 puffs into the lungs every morning. INHALE 2 PUFFS BY MOUTH  FIRST THING IN THE MORNING 04/04/19   Tanda Rockers, MD  cetirizine (ZYRTEC) 10 MG tablet Take 10 mg by mouth daily as needed for allergies.     [provider]  colesevelam (WELCHOL) 625 MG tablet Take 625 mg by mouth 2 (two) times daily.    [provider]  diltiazem (CARDIZEM SR) 90 MG 12 hr capsule Take 90 mg by mouth daily. 02/17/19   [provider]  diltiazem (CARDIZEM) 60 MG tablet Take 60 mg by mouth 4 (four) times daily as needed (tachycardia).    [provider]  diphenoxylate-atropine (LOMOTIL) 2.5-0.025 MG tablet Take 1 tablet by mouth 4 (four) times daily as needed for  diarrhea or loose stools.  07/25/18   [provider]  doxazosin (CARDURA) 8 MG tablet Take 8 mg by mouth at bedtime.    [provider]  famotidine (PEPCID) 20 MG tablet Take 20 mg by mouth at bedtime.    [provider]  furosemide (LASIX) 20 MG tablet Take 2 tablets (40 mg total) by mouth daily. Please make yearly appt with Dr. [name] for November for future refills. 1st attempt 03/02/19   Jerline Pain, MD  gabapentin (NEURONTIN) 300 MG capsule Take 1 capsule by mouth 2 (two) times daily. 07/25/18   [provider]  hydrochlorothiazide (HYDRODIURIL) 25 MG tablet Take 12.5 mg by mouth daily.    [provider]  JANUVIA 100 MG tablet  Take 100 mg by mouth daily. 04/13/16   [provider]  losartan (COZAAR) 100 MG tablet Take 100 mg by mouth daily.    [provider]  meclizine (ANTIVERT) 12.5 MG tablet Take 25 mg by mouth daily.  02/05/16   [provider]  Multiple Vitamin (MULTIVITAMIN WITH MINERALS) TABS tablet Take 1 tablet by mouth at bedtime.     [provider]  nitroGLYCERIN (NITROSTAT) 0.4 MG SL tablet Place 1 tablet (0.4 mg total) under the tongue every 5 (five) minutes as needed for chest pain. 11/14/18   Jerline Pain, MD  ONETOUCH VERIO test strip U UTD ONCE A DAY 07/21/18   [provider]  oxybutynin (DITROPAN) 5 MG tablet Take 5 mg by mouth daily as needed for bladder spasms.    [provider]  oxyCODONE-acetaminophen (PERCOCET/ROXICET) 5-325 MG tablet Take 2 tablets by mouth every 4 (four) hours as needed for severe pain. 05/22/19   Robyn Haber, MD  OXYGEN Inhale 2 L into the lungs at bedtime.    [provider]  pantoprazole (PROTONIX) 40 MG tablet TAKE 1 TABLET BY MOUTH  DAILY 12/09/18   Tanda Rockers, MD  polyethylene glycol powder [facility]) 17 GM/SCOOP powder One capful in 8 oz water daily 05/22/19   Robyn Haber, MD  potassium chloride SA (K-DUR,KLOR-CON) 20 MEQ tablet  Take 1 tablet by mouth  daily 02/25/16   Ria Bush, MD  Propylene Glycol (SYSTANE BALANCE) 0.6 % SOLN Place 2 drops into both eyes 2 (two) times daily.    [provider]  Respiratory Therapy Supplies (FLUTTER) DEVI Use as directed 02/04/15   Tanda Rockers, MD  SPIRIVA RESPIMAT 2.5 MCG/ACT AERS USE 2 SPRAYS(INHALATIONS)   BY MOUTH EVERY MORNING Patient taking differently: Inhale 2 sprays into the lungs every morning.  04/04/19   Tanda Rockers, MD  UNABLE TO FIND Med Name: CPAP with 2lpm o2 with sleep    [provider]    Family History Family History  Problem Relation Age of Onset  . Diabetes Mother   . Heart disease Father   . Diabetes Father   . Heart disease Sister   . Diabetes Sister     Social History Social History   Tobacco Use  . Smoking status: Former Smoker    Packs/day: 1.50    Years: 15.00    Pack years: 22.50    Types: Cigarettes    Quit date: 04/29/1987    Years since quitting: 32.0  . Smokeless tobacco: Never Used  Substance Use Topics  . Alcohol use: Yes    Comment: Rare  . Drug use: No     Allergies   Aspirin, Ibuprofen, Penicillins, and Sulfa antibiotics   Review of Systems Review of Systems   Physical Exam Triage Vital Signs ED Triage Vitals  Enc Vitals Group     BP      Pulse      Resp      Temp      Temp src      SpO2      Weight      Height      Head Circumference      Peak Flow      Pain Score      Pain Loc      Pain Edu?      Excl. in [place]?    No data found.  Updated Vital Signs BP 126/62   Pulse 89   Temp  97.6 F (36.4 C)   Resp 18   SpO2 95%    Physical Exam Vitals signs and nursing note reviewed.  Constitutional:      Appearance: He is obese. He is ill-appearing.  HENT:     Head: Normocephalic.  Eyes:     Conjunctiva/sclera: Conjunctivae normal.  Neck:     Musculoskeletal: Normal range of motion and neck supple.  Pulmonary:     Effort: Pulmonary effort is normal.   Musculoskeletal:     Comments: Obviously in pain with getting up and walking.  Skin:    General: Skin is warm and dry.  Neurological:     General: No focal deficit present.     Mental Status: He is alert.  Psychiatric:        Mood and Affect: Mood normal.      UC Treatments / Results  Labs (all labs ordered are listed, but only abnormal results are displayed) SURGICAL PATHOLOGY SURGICAL PATHOLOGY  CASE: [id number]  PATIENT: Melissa Noon  Surgical Pathology Report      Clinical History: concern for lymphoma (cm)      FINAL MICROSCOPIC DIAGNOSIS:   A. LYMPH NODE, LEFT RETROPERITONEAL, NEEDLE CORE BIOPSY:  - Poorly differentiated adenocarcinoma. See comment  - Lymphoid tissue is not identified     EKG   Radiology CONTRAST:  170mL ISOVUE-300 IOPAMIDOL (ISOVUE-300) INJECTION 61%  COMPARISON:  01/17/2018  FINDINGS: Lower chest: Tiny pulmonary nodules in the posterior right lung base measuring 6 mm on images 1 and 8 of series 3 are stable in the interval, likely benign. Atelectasis or scarring is noted in the lung bases bilaterally.  Hepatobiliary: No suspicious focal abnormality within the liver parenchyma. Gallbladder surgically absent. No intrahepatic or extrahepatic biliary dilation.  Pancreas: No focal mass lesion. No dilatation of the main duct. No intraparenchymal cyst. No peripancreatic edema.  Spleen: No splenomegaly. No focal mass lesion.  Adrenals/Urinary Tract: No adrenal nodule or mass. Right kidney unremarkable. Similar appearance of a 6.5 cm interpolar left renal cyst. Smaller low-density 14 mm interpolar left renal lesion is also stable. No evidence for hydronephrosis. No evidence for hydroureter. Bladder is decompressed.  Stomach/Bowel: Stomach is unremarkable. No gastric wall thickening. No evidence of outlet obstruction. Duodenum is normally positioned as is the ligament of Treitz. No small bowel wall thickening. No small  bowel dilatation. The terminal ileum is normal. The appendix is not visualized, but there is no edema or inflammation in the region of the cecum. No gross colonic mass. No colonic wall thickening.  Vascular/Lymphatic: There is abdominal aortic atherosclerosis without aneurysm. Interval development of lymphadenopathy in the lower mediastinum and abdomen. 13 mm right retrocrural node seen on 12/02. 2.4 cm short axis portal caval node visible on 30/2. 2.3 cm short axis aortocaval node visible on 38/2. 2.8 cm left para-aortic node identified on 50/2. Small lymph nodes are seen in the common iliac chains bilaterally. 15 mm short axis left common femoral node visible on 77/2.  Reproductive: Prostate gland is enlarged.  Other: No intraperitoneal free fluid.  Musculoskeletal: Right groin hernia contains only fat. Multiple new subtle lucent lesions are identified in the bony anatomy including a 13 mm lesion in the L1 vertebral body (32/2), 1.9 cm lesion in the L2 vertebral body (40/2), subtle cortical lesion in the right iliac crest (59/2) and 10 mm sacral lesion on 69/2.  IMPRESSION: 1. Lower mediastinal and abdominal/pelvic lymphadenopathy. This could be related to lymphoma or metastatic disease, but given the interval development of  multiple new lucent bone lesions, metastatic disease is favored. No evidence for a primary lesion on the current exam. 2.  Aortic Atherosclerois (ICD10-170.0) 3. Tiny pulmonary nodules in the right lung base are stable since prior study, suggesting benign etiology. Procedures EXAM: CT-GUIDED BIOPSY LEFT RETROPERITONEAL BULKY ADENOPATHY  MEDICATIONS: 1% lidocaine local  ANESTHESIA/SEDATION: 0.5 mg IV Versed; 12.5 mcg IV Fentanyl  Moderate Sedation Time:  13 minutes  The patient was continuously monitored during the procedure by the interventional radiology nurse under my direct supervision.  PROCEDURE: The procedure, risks, benefits,  and alternatives were explained to the patient. Questions regarding the procedure were encouraged and answered. The patient understands and consents to the procedure.  Previous imaging reviewed. Patient positioned prone. Noncontrast localization CT performed. The left bulky retroperitoneal periaortic adenopathy was localized and marked.  Under sterile conditions and local anesthesia, a 17 gauge 16.8 cm guide was advanced to the adenopathy. Needle position confirmed with CT. 18 gauge core biopsies obtained. Samples placed in saline. Needle removed. Postprocedure imaging demonstrates no hemorrhage or hematoma.  Patient tolerated the procedure well without complication. Vital sign monitoring by nursing staff during the procedure will continue as patient is in the special procedures unit for post procedure observation.  FINDINGS: The images document guide needle placement within the left retroperitoneal periaortic adenopathy. Post biopsy images demonstrate no hemorrhage or hematoma.  COMPLICATIONS: None immediate.  IMPRESSION: Successful CT-guided core biopsy of the left retroperitoneal periaortic adenopathy   Electronically Signed   By: [name].  Shick M.D.   On: 05/11/2019 14:24   Medications Ordered in UC Medications - No data to display  Initial Impression / Assessment and Plan / UC Course  I have reviewed the triage vital signs and the nursing notes.  Pertinent labs & imaging results that were available during my care of the patient were reviewed by me and considered in my medical decision making (see chart for details).   I have offered to get the patient attention over it was a long emergency room but patient prefers to try to stick out the next week with pain medicine until he can see the oncologist. Final Clinical Impressions(s) / UC Diagnoses   Final diagnoses:  Metastatic adenocarcinoma [facility])     Discharge Instructions     PSA report 2.52 (normal)  Stool test for colon cancer last year was normal    ED Prescriptions    Medication Sig Dispense Auth. Provider   oxyCODONE-acetaminophen (PERCOCET/ROXICET) 5-325 MG tablet Take 2 tablets by mouth every 4 (four) hours as needed for severe pain. 30 tablet Robyn Haber, MD   polyethylene glycol powder (MIRALAX) 17 GM/SCOOP powder One capful in 8 oz water daily 255 g Robyn Haber, MD     I have reviewed the PDMP during this encounter.   Robyn Haber, MD 05/22/19 1340

## 2019-05-22 NOTE — Discharge Instructions (Signed)
PSA report 2.52 (normal) Stool test for colon cancer last year was normal

## 2019-05-24 ENCOUNTER — Telehealth (HOSPITAL_COMMUNITY): Payer: Self-pay

## 2019-05-24 ENCOUNTER — Inpatient Hospital Stay (HOSPITAL_COMMUNITY)
Admission: EM | Admit: 2019-05-24 | Discharge: 2019-06-20 | DRG: 542 | Disposition: E | Payer: Medicare Other | Attending: Internal Medicine | Admitting: Internal Medicine

## 2019-05-24 ENCOUNTER — Other Ambulatory Visit: Payer: Self-pay

## 2019-05-24 ENCOUNTER — Encounter (HOSPITAL_COMMUNITY): Payer: Self-pay | Admitting: *Deleted

## 2019-05-24 ENCOUNTER — Emergency Department (HOSPITAL_COMMUNITY): Payer: Medicare Other

## 2019-05-24 DIAGNOSIS — Z79899 Other long term (current) drug therapy: Secondary | ICD-10-CM

## 2019-05-24 DIAGNOSIS — I2583 Coronary atherosclerosis due to lipid rich plaque: Secondary | ICD-10-CM | POA: Diagnosis present

## 2019-05-24 DIAGNOSIS — Z880 Allergy status to penicillin: Secondary | ICD-10-CM

## 2019-05-24 DIAGNOSIS — Z96611 Presence of right artificial shoulder joint: Secondary | ICD-10-CM | POA: Diagnosis present

## 2019-05-24 DIAGNOSIS — E875 Hyperkalemia: Secondary | ICD-10-CM | POA: Diagnosis not present

## 2019-05-24 DIAGNOSIS — I130 Hypertensive heart and chronic kidney disease with heart failure and stage 1 through stage 4 chronic kidney disease, or unspecified chronic kidney disease: Secondary | ICD-10-CM | POA: Diagnosis present

## 2019-05-24 DIAGNOSIS — N3289 Other specified disorders of bladder: Secondary | ICD-10-CM | POA: Diagnosis present

## 2019-05-24 DIAGNOSIS — N401 Enlarged prostate with lower urinary tract symptoms: Secondary | ICD-10-CM | POA: Diagnosis present

## 2019-05-24 DIAGNOSIS — K219 Gastro-esophageal reflux disease without esophagitis: Secondary | ICD-10-CM | POA: Diagnosis present

## 2019-05-24 DIAGNOSIS — Z9981 Dependence on supplemental oxygen: Secondary | ICD-10-CM

## 2019-05-24 DIAGNOSIS — G9341 Metabolic encephalopathy: Secondary | ICD-10-CM | POA: Diagnosis not present

## 2019-05-24 DIAGNOSIS — Z87442 Personal history of urinary calculi: Secondary | ICD-10-CM

## 2019-05-24 DIAGNOSIS — R059 Cough, unspecified: Secondary | ICD-10-CM

## 2019-05-24 DIAGNOSIS — Z66 Do not resuscitate: Secondary | ICD-10-CM | POA: Diagnosis not present

## 2019-05-24 DIAGNOSIS — D869 Sarcoidosis, unspecified: Secondary | ICD-10-CM | POA: Diagnosis present

## 2019-05-24 DIAGNOSIS — G253 Myoclonus: Secondary | ICD-10-CM | POA: Diagnosis not present

## 2019-05-24 DIAGNOSIS — J9621 Acute and chronic respiratory failure with hypoxia: Secondary | ICD-10-CM | POA: Diagnosis not present

## 2019-05-24 DIAGNOSIS — Z20828 Contact with and (suspected) exposure to other viral communicable diseases: Secondary | ICD-10-CM | POA: Diagnosis present

## 2019-05-24 DIAGNOSIS — I5033 Acute on chronic diastolic (congestive) heart failure: Secondary | ICD-10-CM | POA: Diagnosis present

## 2019-05-24 DIAGNOSIS — Z833 Family history of diabetes mellitus: Secondary | ICD-10-CM

## 2019-05-24 DIAGNOSIS — Z79891 Long term (current) use of opiate analgesic: Secondary | ICD-10-CM

## 2019-05-24 DIAGNOSIS — R1084 Generalized abdominal pain: Secondary | ICD-10-CM | POA: Diagnosis not present

## 2019-05-24 DIAGNOSIS — E1122 Type 2 diabetes mellitus with diabetic chronic kidney disease: Secondary | ICD-10-CM | POA: Diagnosis present

## 2019-05-24 DIAGNOSIS — G4733 Obstructive sleep apnea (adult) (pediatric): Secondary | ICD-10-CM | POA: Diagnosis present

## 2019-05-24 DIAGNOSIS — Z6841 Body Mass Index (BMI) 40.0 and over, adult: Secondary | ICD-10-CM

## 2019-05-24 DIAGNOSIS — C801 Malignant (primary) neoplasm, unspecified: Secondary | ICD-10-CM | POA: Diagnosis present

## 2019-05-24 DIAGNOSIS — Z955 Presence of coronary angioplasty implant and graft: Secondary | ICD-10-CM

## 2019-05-24 DIAGNOSIS — M545 Low back pain: Secondary | ICD-10-CM | POA: Diagnosis present

## 2019-05-24 DIAGNOSIS — I2510 Atherosclerotic heart disease of native coronary artery without angina pectoris: Secondary | ICD-10-CM | POA: Diagnosis present

## 2019-05-24 DIAGNOSIS — Z7189 Other specified counseling: Secondary | ICD-10-CM

## 2019-05-24 DIAGNOSIS — E8809 Other disorders of plasma-protein metabolism, not elsewhere classified: Secondary | ICD-10-CM | POA: Diagnosis present

## 2019-05-24 DIAGNOSIS — J441 Chronic obstructive pulmonary disease with (acute) exacerbation: Secondary | ICD-10-CM | POA: Diagnosis present

## 2019-05-24 DIAGNOSIS — E222 Syndrome of inappropriate secretion of antidiuretic hormone: Secondary | ICD-10-CM | POA: Diagnosis present

## 2019-05-24 DIAGNOSIS — Z7984 Long term (current) use of oral hypoglycemic drugs: Secondary | ICD-10-CM

## 2019-05-24 DIAGNOSIS — C799 Secondary malignant neoplasm of unspecified site: Secondary | ICD-10-CM

## 2019-05-24 DIAGNOSIS — R05 Cough: Secondary | ICD-10-CM

## 2019-05-24 DIAGNOSIS — I700 Atherosclerosis of aorta: Secondary | ICD-10-CM | POA: Diagnosis present

## 2019-05-24 DIAGNOSIS — I5030 Unspecified diastolic (congestive) heart failure: Secondary | ICD-10-CM | POA: Diagnosis present

## 2019-05-24 DIAGNOSIS — C7951 Secondary malignant neoplasm of bone: Principal | ICD-10-CM

## 2019-05-24 DIAGNOSIS — Z9049 Acquired absence of other specified parts of digestive tract: Secondary | ICD-10-CM

## 2019-05-24 DIAGNOSIS — E785 Hyperlipidemia, unspecified: Secondary | ICD-10-CM | POA: Diagnosis present

## 2019-05-24 DIAGNOSIS — M549 Dorsalgia, unspecified: Secondary | ICD-10-CM | POA: Diagnosis present

## 2019-05-24 DIAGNOSIS — G893 Neoplasm related pain (acute) (chronic): Secondary | ICD-10-CM | POA: Diagnosis present

## 2019-05-24 DIAGNOSIS — B309 Viral conjunctivitis, unspecified: Secondary | ICD-10-CM | POA: Diagnosis not present

## 2019-05-24 DIAGNOSIS — R338 Other retention of urine: Secondary | ICD-10-CM | POA: Diagnosis not present

## 2019-05-24 DIAGNOSIS — E1142 Type 2 diabetes mellitus with diabetic polyneuropathy: Secondary | ICD-10-CM | POA: Diagnosis present

## 2019-05-24 DIAGNOSIS — Z888 Allergy status to other drugs, medicaments and biological substances status: Secondary | ICD-10-CM

## 2019-05-24 DIAGNOSIS — J449 Chronic obstructive pulmonary disease, unspecified: Secondary | ICD-10-CM | POA: Diagnosis present

## 2019-05-24 DIAGNOSIS — C8590 Non-Hodgkin lymphoma, unspecified, unspecified site: Secondary | ICD-10-CM | POA: Diagnosis present

## 2019-05-24 DIAGNOSIS — Z7951 Long term (current) use of inhaled steroids: Secondary | ICD-10-CM

## 2019-05-24 DIAGNOSIS — R161 Splenomegaly, not elsewhere classified: Secondary | ICD-10-CM | POA: Diagnosis not present

## 2019-05-24 DIAGNOSIS — Z8249 Family history of ischemic heart disease and other diseases of the circulatory system: Secondary | ICD-10-CM

## 2019-05-24 DIAGNOSIS — Z515 Encounter for palliative care: Secondary | ICD-10-CM | POA: Diagnosis not present

## 2019-05-24 DIAGNOSIS — N184 Chronic kidney disease, stage 4 (severe): Secondary | ICD-10-CM | POA: Diagnosis present

## 2019-05-24 DIAGNOSIS — R0600 Dyspnea, unspecified: Secondary | ICD-10-CM

## 2019-05-24 DIAGNOSIS — Z87891 Personal history of nicotine dependence: Secondary | ICD-10-CM

## 2019-05-24 DIAGNOSIS — Z882 Allergy status to sulfonamides status: Secondary | ICD-10-CM

## 2019-05-24 DIAGNOSIS — D631 Anemia in chronic kidney disease: Secondary | ICD-10-CM | POA: Diagnosis present

## 2019-05-24 DIAGNOSIS — Z7982 Long term (current) use of aspirin: Secondary | ICD-10-CM

## 2019-05-24 DIAGNOSIS — N179 Acute kidney failure, unspecified: Secondary | ICD-10-CM | POA: Diagnosis present

## 2019-05-24 LAB — URINALYSIS, ROUTINE W REFLEX MICROSCOPIC
Bilirubin Urine: NEGATIVE
Glucose, UA: NEGATIVE mg/dL
Hgb urine dipstick: NEGATIVE
Ketones, ur: NEGATIVE mg/dL
Leukocytes,Ua: NEGATIVE
Nitrite: NEGATIVE
Protein, ur: NEGATIVE mg/dL
Specific Gravity, Urine: 1.017 (ref 1.005–1.030)
pH: 5 (ref 5.0–8.0)

## 2019-05-24 LAB — COMPREHENSIVE METABOLIC PANEL
ALT: 19 U/L (ref 0–44)
AST: 32 U/L (ref 15–41)
Albumin: 3.1 g/dL — ABNORMAL LOW (ref 3.5–5.0)
Alkaline Phosphatase: 114 U/L (ref 38–126)
Anion gap: 7 (ref 5–15)
BUN: 30 mg/dL — ABNORMAL HIGH (ref 8–23)
CO2: 32 mmol/L (ref 22–32)
Calcium: 8.9 mg/dL (ref 8.9–10.3)
Chloride: 92 mmol/L — ABNORMAL LOW (ref 98–111)
Creatinine, Ser: 2.18 mg/dL — ABNORMAL HIGH (ref 0.61–1.24)
GFR calc Af Amer: 33 mL/min — ABNORMAL LOW (ref >60)
GFR calc non Af Amer: 29 mL/min — ABNORMAL LOW (ref >60)
Glucose, Bld: 121 mg/dL — ABNORMAL HIGH (ref 70–99)
Potassium: 4.5 mmol/L (ref 3.5–5.1)
Sodium: 131 mmol/L — ABNORMAL LOW (ref 135–145)
Total Bilirubin: 1.1 mg/dL (ref 0.3–1.2)
Total Protein: 5.6 g/dL — ABNORMAL LOW (ref 6.5–8.1)

## 2019-05-24 LAB — CBC
HCT: 38.3 % — ABNORMAL LOW (ref 39.0–52.0)
Hemoglobin: 11.9 g/dL — ABNORMAL LOW (ref 13.0–17.0)
MCH: 28.4 pg (ref 26.0–34.0)
MCHC: 31.1 g/dL (ref 30.0–36.0)
MCV: 91.4 fL (ref 80.0–100.0)
Platelets: 150 10*3/uL (ref 150–400)
RBC: 4.19 MIL/uL — ABNORMAL LOW (ref 4.22–5.81)
RDW: 16.8 % — ABNORMAL HIGH (ref 11.5–15.5)
WBC: 6.9 10*3/uL (ref 4.0–10.5)
nRBC: 0 % (ref 0.0–0.2)

## 2019-05-24 LAB — LIPASE, BLOOD: Lipase: 55 U/L — ABNORMAL HIGH (ref 11–51)

## 2019-05-24 MED ORDER — ACETAMINOPHEN 500 MG PO TABS
1000.0000 mg | ORAL_TABLET | Freq: Once | ORAL | Status: AC
  Administered 2019-05-24: 1000 mg via ORAL
  Filled 2019-05-24: qty 2

## 2019-05-24 MED ORDER — SODIUM CHLORIDE 0.9% FLUSH: 3.0000 mL | Freq: Once | INTRAVENOUS | Status: DC

## 2019-05-24 MED ORDER — LIDOCAINE 5 % EX PTCH
3.0000 | MEDICATED_PATCH | CUTANEOUS | Status: DC
  Administered 2019-05-24 – 2019-06-05 (×12): 3 via TRANSDERMAL
  Filled 2019-05-24 (×14): qty 3

## 2019-05-24 NOTE — ED Notes (Signed)
Pt attempting to provide urine specimen

## 2019-05-24 NOTE — Telephone Encounter (Signed)
Spoke with patient on the phone regarding what to do with his continued severe back and abdominal pain. Pt was told by the MD during his visit that if he was not feeling better by today that he should call this [facility] and the MD would get him direct-admitted to the hospital. [facility] is not able to direct-admit, and the patient was recommended to go to the closest emergency department for further workup and evaluation. The patient is agreeable to this despite the confusion regarding being directly admitted to the hospital.

## 2019-05-24 NOTE — ED Triage Notes (Signed)
Pt reporting lower back pain for about a week. Generalized abdominal pain for about the same time. Pt stating that he has had several workups for the same, due to see oncologist. Pt says he was seen at Urgent Care for the pain and given oxycodone, which only helps for about 3-4 hours. Decreased urinary output today.

## 2019-05-25 ENCOUNTER — Other Ambulatory Visit: Payer: Self-pay

## 2019-05-25 ENCOUNTER — Encounter (HOSPITAL_COMMUNITY): Payer: Self-pay | Admitting: Emergency Medicine

## 2019-05-25 DIAGNOSIS — M549 Dorsalgia, unspecified: Secondary | ICD-10-CM | POA: Diagnosis present

## 2019-05-25 DIAGNOSIS — R0602 Shortness of breath: Secondary | ICD-10-CM | POA: Diagnosis not present

## 2019-05-25 DIAGNOSIS — E1142 Type 2 diabetes mellitus with diabetic polyneuropathy: Secondary | ICD-10-CM | POA: Diagnosis present

## 2019-05-25 DIAGNOSIS — Z515 Encounter for palliative care: Secondary | ICD-10-CM | POA: Diagnosis not present

## 2019-05-25 DIAGNOSIS — J9610 Chronic respiratory failure, unspecified whether with hypoxia or hypercapnia: Secondary | ICD-10-CM | POA: Diagnosis not present

## 2019-05-25 DIAGNOSIS — J9601 Acute respiratory failure with hypoxia: Secondary | ICD-10-CM | POA: Diagnosis not present

## 2019-05-25 DIAGNOSIS — N401 Enlarged prostate with lower urinary tract symptoms: Secondary | ICD-10-CM | POA: Diagnosis present

## 2019-05-25 DIAGNOSIS — K219 Gastro-esophageal reflux disease without esophagitis: Secondary | ICD-10-CM | POA: Diagnosis present

## 2019-05-25 DIAGNOSIS — Z7189 Other specified counseling: Secondary | ICD-10-CM | POA: Diagnosis not present

## 2019-05-25 DIAGNOSIS — R531 Weakness: Secondary | ICD-10-CM | POA: Diagnosis not present

## 2019-05-25 DIAGNOSIS — I1310 Hypertensive heart and chronic kidney disease without heart failure, with stage 1 through stage 4 chronic kidney disease, or unspecified chronic kidney disease: Secondary | ICD-10-CM | POA: Diagnosis not present

## 2019-05-25 DIAGNOSIS — Z66 Do not resuscitate: Secondary | ICD-10-CM | POA: Diagnosis not present

## 2019-05-25 DIAGNOSIS — I5033 Acute on chronic diastolic (congestive) heart failure: Secondary | ICD-10-CM | POA: Diagnosis not present

## 2019-05-25 DIAGNOSIS — E222 Syndrome of inappropriate secretion of antidiuretic hormone: Secondary | ICD-10-CM | POA: Diagnosis present

## 2019-05-25 DIAGNOSIS — E1122 Type 2 diabetes mellitus with diabetic chronic kidney disease: Secondary | ICD-10-CM | POA: Diagnosis not present

## 2019-05-25 DIAGNOSIS — M545 Low back pain: Secondary | ICD-10-CM

## 2019-05-25 DIAGNOSIS — C8590 Non-Hodgkin lymphoma, unspecified, unspecified site: Secondary | ICD-10-CM | POA: Diagnosis present

## 2019-05-25 DIAGNOSIS — I2583 Coronary atherosclerosis due to lipid rich plaque: Secondary | ICD-10-CM

## 2019-05-25 DIAGNOSIS — R05 Cough: Secondary | ICD-10-CM | POA: Diagnosis not present

## 2019-05-25 DIAGNOSIS — J438 Other emphysema: Secondary | ICD-10-CM | POA: Diagnosis not present

## 2019-05-25 DIAGNOSIS — I5031 Acute diastolic (congestive) heart failure: Secondary | ICD-10-CM | POA: Diagnosis not present

## 2019-05-25 DIAGNOSIS — R609 Edema, unspecified: Secondary | ICD-10-CM | POA: Diagnosis not present

## 2019-05-25 DIAGNOSIS — J90 Pleural effusion, not elsewhere classified: Secondary | ICD-10-CM | POA: Diagnosis not present

## 2019-05-25 DIAGNOSIS — I5030 Unspecified diastolic (congestive) heart failure: Secondary | ICD-10-CM | POA: Diagnosis present

## 2019-05-25 DIAGNOSIS — C7951 Secondary malignant neoplasm of bone: Principal | ICD-10-CM

## 2019-05-25 DIAGNOSIS — B309 Viral conjunctivitis, unspecified: Secondary | ICD-10-CM | POA: Diagnosis not present

## 2019-05-25 DIAGNOSIS — M5489 Other dorsalgia: Secondary | ICD-10-CM | POA: Diagnosis not present

## 2019-05-25 DIAGNOSIS — N1831 Chronic kidney disease, stage 3a: Secondary | ICD-10-CM

## 2019-05-25 DIAGNOSIS — I2510 Atherosclerotic heart disease of native coronary artery without angina pectoris: Secondary | ICD-10-CM

## 2019-05-25 DIAGNOSIS — C801 Malignant (primary) neoplasm, unspecified: Secondary | ICD-10-CM | POA: Diagnosis not present

## 2019-05-25 DIAGNOSIS — J441 Chronic obstructive pulmonary disease with (acute) exacerbation: Secondary | ICD-10-CM | POA: Diagnosis not present

## 2019-05-25 DIAGNOSIS — R52 Pain, unspecified: Secondary | ICD-10-CM | POA: Diagnosis not present

## 2019-05-25 DIAGNOSIS — G4733 Obstructive sleep apnea (adult) (pediatric): Secondary | ICD-10-CM | POA: Diagnosis present

## 2019-05-25 DIAGNOSIS — N184 Chronic kidney disease, stage 4 (severe): Secondary | ICD-10-CM | POA: Diagnosis not present

## 2019-05-25 DIAGNOSIS — C799 Secondary malignant neoplasm of unspecified site: Secondary | ICD-10-CM | POA: Diagnosis not present

## 2019-05-25 DIAGNOSIS — C778 Secondary and unspecified malignant neoplasm of lymph nodes of multiple regions: Secondary | ICD-10-CM | POA: Diagnosis not present

## 2019-05-25 DIAGNOSIS — G893 Neoplasm related pain (acute) (chronic): Secondary | ICD-10-CM | POA: Diagnosis not present

## 2019-05-25 DIAGNOSIS — Z6841 Body Mass Index (BMI) 40.0 and over, adult: Secondary | ICD-10-CM | POA: Diagnosis not present

## 2019-05-25 DIAGNOSIS — Z9981 Dependence on supplemental oxygen: Secondary | ICD-10-CM | POA: Diagnosis not present

## 2019-05-25 DIAGNOSIS — N179 Acute kidney failure, unspecified: Secondary | ICD-10-CM | POA: Diagnosis not present

## 2019-05-25 DIAGNOSIS — J410 Simple chronic bronchitis: Secondary | ICD-10-CM | POA: Diagnosis not present

## 2019-05-25 DIAGNOSIS — N400 Enlarged prostate without lower urinary tract symptoms: Secondary | ICD-10-CM | POA: Diagnosis not present

## 2019-05-25 DIAGNOSIS — M5440 Lumbago with sciatica, unspecified side: Secondary | ICD-10-CM | POA: Diagnosis not present

## 2019-05-25 DIAGNOSIS — E871 Hypo-osmolality and hyponatremia: Secondary | ICD-10-CM | POA: Diagnosis not present

## 2019-05-25 DIAGNOSIS — Z20828 Contact with and (suspected) exposure to other viral communicable diseases: Secondary | ICD-10-CM | POA: Diagnosis not present

## 2019-05-25 DIAGNOSIS — I130 Hypertensive heart and chronic kidney disease with heart failure and stage 1 through stage 4 chronic kidney disease, or unspecified chronic kidney disease: Secondary | ICD-10-CM | POA: Diagnosis not present

## 2019-05-25 DIAGNOSIS — J9621 Acute and chronic respiratory failure with hypoxia: Secondary | ICD-10-CM | POA: Diagnosis not present

## 2019-05-25 DIAGNOSIS — E875 Hyperkalemia: Secondary | ICD-10-CM | POA: Diagnosis not present

## 2019-05-25 DIAGNOSIS — G9341 Metabolic encephalopathy: Secondary | ICD-10-CM | POA: Diagnosis not present

## 2019-05-25 LAB — LACTATE DEHYDROGENASE: LDH: 209 U/L — ABNORMAL HIGH (ref 98–192)

## 2019-05-25 LAB — COMPREHENSIVE METABOLIC PANEL
ALT: 19 U/L (ref 0–44)
AST: 32 U/L (ref 15–41)
Albumin: 3.3 g/dL — ABNORMAL LOW (ref 3.5–5.0)
Alkaline Phosphatase: 109 U/L (ref 38–126)
Anion gap: 8 (ref 5–15)
BUN: 35 mg/dL — ABNORMAL HIGH (ref 8–23)
CO2: 30 mmol/L (ref 22–32)
Calcium: 8.9 mg/dL (ref 8.9–10.3)
Chloride: 93 mmol/L — ABNORMAL LOW (ref 98–111)
Creatinine, Ser: 2.19 mg/dL — ABNORMAL HIGH (ref 0.61–1.24)
GFR calc Af Amer: 33 mL/min — ABNORMAL LOW (ref >60)
GFR calc non Af Amer: 29 mL/min — ABNORMAL LOW (ref >60)
Glucose, Bld: 129 mg/dL — ABNORMAL HIGH (ref 70–99)
Potassium: 4.4 mmol/L (ref 3.5–5.1)
Sodium: 131 mmol/L — ABNORMAL LOW (ref 135–145)
Total Bilirubin: 1.5 mg/dL — ABNORMAL HIGH (ref 0.3–1.2)
Total Protein: 5.6 g/dL — ABNORMAL LOW (ref 6.5–8.1)

## 2019-05-25 LAB — CBC
HCT: 38.4 % — ABNORMAL LOW (ref 39.0–52.0)
Hemoglobin: 12 g/dL — ABNORMAL LOW (ref 13.0–17.0)
MCH: 28.8 pg (ref 26.0–34.0)
MCHC: 31.3 g/dL (ref 30.0–36.0)
MCV: 92.1 fL (ref 80.0–100.0)
Platelets: 153 10*3/uL (ref 150–400)
RBC: 4.17 MIL/uL — ABNORMAL LOW (ref 4.22–5.81)
RDW: 17 % — ABNORMAL HIGH (ref 11.5–15.5)
WBC: 7.8 10*3/uL (ref 4.0–10.5)
nRBC: 0 % (ref 0.0–0.2)

## 2019-05-25 LAB — SODIUM, URINE, RANDOM: Sodium, Ur: <10 mmol/L

## 2019-05-25 LAB — PSA: Prostatic Specific Antigen: 2.61 ng/mL (ref 0.00–4.00)

## 2019-05-25 LAB — PROTEIN / CREATININE RATIO, URINE
Creatinine, Urine: 374.25 mg/dL
Protein Creatinine Ratio: 0.07 mg/mg{Cre} (ref 0.00–0.15)
Total Protein, Urine: 27 mg/dL

## 2019-05-25 LAB — SARS CORONAVIRUS 2 (TAT 6-24 HRS): SARS Coronavirus 2: NEGATIVE

## 2019-05-25 MED ORDER — DIPHENOXYLATE-ATROPINE 2.5-0.025 MG PO TABS: 1.0000 | ORAL_TABLET | Freq: Four times a day (QID) | ORAL | Status: DC | PRN

## 2019-05-25 MED ORDER — FUROSEMIDE 10 MG/ML IJ SOLN
20.0000 mg | Freq: Every day | INTRAMUSCULAR | Status: DC
  Administered 2019-05-25 – 2019-05-28 (×4): 20 mg via INTRAVENOUS
  Filled 2019-05-25 (×4): qty 2

## 2019-05-25 MED ORDER — FAMOTIDINE 20 MG PO TABS
20.0000 mg | ORAL_TABLET | Freq: Every day | ORAL | Status: DC
  Administered 2019-05-25 – 2019-06-03 (×10): 20 mg via ORAL
  Filled 2019-05-25 (×10): qty 1

## 2019-05-25 MED ORDER — DILTIAZEM HCL 60 MG PO TABS: 60.0000 mg | ORAL_TABLET | Freq: Four times a day (QID) | ORAL | Status: DC | PRN

## 2019-05-25 MED ORDER — MECLIZINE HCL 25 MG PO TABS
25.0000 mg | ORAL_TABLET | Freq: Every day | ORAL | Status: DC
  Administered 2019-05-25 – 2019-06-05 (×11): 25 mg via ORAL
  Filled 2019-05-25 (×11): qty 1

## 2019-05-25 MED ORDER — MAGNESIUM OXIDE 400 (241.3 MG) MG PO TABS
400.0000 mg | ORAL_TABLET | Freq: Every day | ORAL | Status: DC
  Administered 2019-05-25 – 2019-06-05 (×11): 400 mg via ORAL
  Filled 2019-05-25 (×11): qty 1

## 2019-05-25 MED ORDER — SODIUM CHLORIDE 0.9 % IV SOLN
INTRAVENOUS | Status: AC
  Administered 2019-05-25: 01:00:00 via INTRAVENOUS

## 2019-05-25 MED ORDER — LORATADINE 10 MG PO TABS
10.0000 mg | ORAL_TABLET | Freq: Every day | ORAL | Status: DC
  Administered 2019-05-25 – 2019-06-05 (×11): 10 mg via ORAL
  Filled 2019-05-25 (×11): qty 1

## 2019-05-25 MED ORDER — LINAGLIPTIN 5 MG PO TABS
5.0000 mg | ORAL_TABLET | Freq: Every day | ORAL | Status: DC
  Administered 2019-05-25 – 2019-06-05 (×11): 5 mg via ORAL
  Filled 2019-05-25 (×11): qty 1

## 2019-05-25 MED ORDER — ENOXAPARIN SODIUM 60 MG/0.6ML ~~LOC~~ SOLN
60.0000 mg | SUBCUTANEOUS | Status: DC
  Administered 2019-05-25 – 2019-05-29 (×5): 60 mg via SUBCUTANEOUS
  Filled 2019-05-25 (×5): qty 0.6

## 2019-05-25 MED ORDER — DILTIAZEM HCL ER 90 MG PO CP12
90.0000 mg | ORAL_CAPSULE | Freq: Every day | ORAL | Status: DC
  Administered 2019-05-25 – 2019-06-02 (×9): 90 mg via ORAL
  Filled 2019-05-25 (×11): qty 1

## 2019-05-25 MED ORDER — ATORVASTATIN CALCIUM 40 MG PO TABS
40.0000 mg | ORAL_TABLET | Freq: Every day | ORAL | Status: DC
  Administered 2019-05-25 – 2019-06-05 (×11): 40 mg via ORAL
  Filled 2019-05-25 (×11): qty 1

## 2019-05-25 MED ORDER — FENTANYL CITRATE (PF) 100 MCG/2ML IJ SOLN
50.0000 ug | Freq: Once | INTRAMUSCULAR | Status: AC
  Administered 2019-05-25: 50 ug via INTRAVENOUS
  Filled 2019-05-25: qty 2

## 2019-05-25 MED ORDER — MOMETASONE FURO-FORMOTEROL FUM 200-5 MCG/ACT IN AERO
2.0000 | INHALATION_SPRAY | Freq: Two times a day (BID) | RESPIRATORY_TRACT | Status: DC
  Administered 2019-05-25 – 2019-06-05 (×23): 2 via RESPIRATORY_TRACT
  Filled 2019-05-25: qty 8.8

## 2019-05-25 MED ORDER — POLYVINYL ALCOHOL 1.4 % OP SOLN: 2.0000 [drp] | OPHTHALMIC | Status: DC | PRN

## 2019-05-25 MED ORDER — POLYETHYLENE GLYCOL 3350 17 GM/SCOOP PO POWD: 17.0000 g | Freq: Every day | ORAL | Status: DC

## 2019-05-25 MED ORDER — HYDROMORPHONE HCL 1 MG/ML IJ SOLN
1.0000 mg | INTRAMUSCULAR | Status: DC | PRN
  Administered 2019-05-26 – 2019-05-29 (×14): 1 mg via INTRAVENOUS
  Filled 2019-05-25 (×14): qty 1

## 2019-05-25 MED ORDER — PROPYLENE GLYCOL 0.6 % OP SOLN: 2.0000 [drp] | Freq: Two times a day (BID) | OPHTHALMIC | Status: DC

## 2019-05-25 MED ORDER — GABAPENTIN 300 MG PO CAPS
300.0000 mg | ORAL_CAPSULE | Freq: Two times a day (BID) | ORAL | Status: DC
  Administered 2019-05-25 – 2019-06-02 (×18): 300 mg via ORAL
  Filled 2019-05-25: qty 3
  Filled 2019-05-25 (×17): qty 1

## 2019-05-25 MED ORDER — COLESEVELAM HCL 625 MG PO TABS
625.0000 mg | ORAL_TABLET | Freq: Two times a day (BID) | ORAL | Status: DC
  Administered 2019-05-25 – 2019-06-05 (×21): 625 mg via ORAL
  Filled 2019-05-25 (×25): qty 1

## 2019-05-25 MED ORDER — ONDANSETRON HCL 4 MG/2ML IJ SOLN
4.0000 mg | Freq: Four times a day (QID) | INTRAMUSCULAR | Status: DC | PRN
  Administered 2019-06-02: 4 mg via INTRAVENOUS
  Filled 2019-05-25: qty 2

## 2019-05-25 MED ORDER — POLYETHYLENE GLYCOL 3350 17 G PO PACK
17.0000 g | PACK | Freq: Every day | ORAL | Status: DC
  Administered 2019-05-25 – 2019-06-05 (×10): 17 g via ORAL
  Filled 2019-05-25 (×10): qty 1

## 2019-05-25 MED ORDER — ACETAMINOPHEN 325 MG PO TABS
650.0000 mg | ORAL_TABLET | Freq: Four times a day (QID) | ORAL | Status: DC | PRN
  Filled 2019-05-25 (×2): qty 2

## 2019-05-25 MED ORDER — OXYBUTYNIN CHLORIDE 5 MG PO TABS
5.0000 mg | ORAL_TABLET | Freq: Every day | ORAL | Status: DC | PRN
  Administered 2019-05-31 – 2019-06-02 (×2): 5 mg via ORAL
  Filled 2019-05-25 (×2): qty 1

## 2019-05-25 MED ORDER — PANTOPRAZOLE SODIUM 40 MG PO TBEC
40.0000 mg | DELAYED_RELEASE_TABLET | Freq: Every day | ORAL | Status: DC
  Administered 2019-05-25 – 2019-06-05 (×11): 40 mg via ORAL
  Filled 2019-05-25 (×11): qty 1

## 2019-05-25 MED ORDER — SODIUM CHLORIDE 0.9 % IV SOLN
2.0000 g | Freq: Three times a day (TID) | INTRAVENOUS | Status: DC
  Administered 2019-05-25: 05:00:00 2 g via INTRAVENOUS
  Filled 2019-05-25: qty 2

## 2019-05-25 MED ORDER — ACETAMINOPHEN 650 MG RE SUPP: 650.0000 mg | Freq: Four times a day (QID) | RECTAL | Status: DC | PRN

## 2019-05-25 MED ORDER — DOXAZOSIN MESYLATE 4 MG PO TABS
8.0000 mg | ORAL_TABLET | Freq: Every day | ORAL | Status: DC
  Administered 2019-05-25 – 2019-05-28 (×4): 8 mg via ORAL
  Filled 2019-05-25 (×4): qty 2

## 2019-05-25 MED ORDER — ALBUTEROL SULFATE (2.5 MG/3ML) 0.083% IN NEBU: 3.0000 mL | INHALATION_SOLUTION | RESPIRATORY_TRACT | Status: DC | PRN

## 2019-05-25 MED ORDER — HYDRALAZINE HCL 20 MG/ML IJ SOLN
10.0000 mg | Freq: Four times a day (QID) | INTRAMUSCULAR | Status: DC | PRN
  Administered 2019-06-02 – 2019-06-04 (×2): 10 mg via INTRAVENOUS
  Filled 2019-05-25 (×2): qty 1

## 2019-05-25 MED ORDER — ASPIRIN EC 81 MG PO TBEC
81.0000 mg | DELAYED_RELEASE_TABLET | Freq: Every day | ORAL | Status: DC
  Administered 2019-05-25 – 2019-06-05 (×11): 81 mg via ORAL
  Filled 2019-05-25 (×11): qty 1

## 2019-05-25 MED ORDER — MAGNESIUM OXIDE 400 MG PO TABS
400.0000 mg | ORAL_TABLET | Freq: Every day | ORAL | Status: DC
  Filled 2019-05-25: qty 1

## 2019-05-25 NOTE — Plan of Care (Signed)
POC initiated 

## 2019-05-25 NOTE — ED Provider Notes (Addendum)
Shane Watson Provider Note   CSN: [id number] Arrival date & time: 06/14/2019  2016     History   Chief Complaint Chief Complaint  Patient presents with   Back Pain    HPI Shane Watson is a 73 y.o. male.     The history is provided by the patient.  Back Pain Location:  Sacro-iliac joint Quality:  Aching Radiates to: groin. Pain severity:  Moderate Pain is:  Same all the time Onset quality:  Gradual Timing:  Constant Progression:  Worsening Chronicity:  New Context: not emotional stress, not falling, not jumping from heights, not lifting heavy objects, not MCA, not MVA, not occupational injury, not pedestrian accident, not physical stress, not recent illness, not recent injury and not twisting   Relieved by:  Nothing Worsened by:  Nothing Ineffective treatments:  None tried Associated symptoms: no abdominal pain, no abdominal swelling, no bladder incontinence, no bowel incontinence, no chest pain, no dysuria, no fever, no headaches, no leg pain, no numbness, no paresthesias, no pelvic pain, no perianal numbness, no tingling, no weakness and no weight loss   Risk factors: no recent surgery   Patient has had this pain for weeks.  Had a biopsy based on CT. 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  Was seen again for pain and started on narcotic pain medication.  He is here because it is not completely relieving his pain and he "needs to know why I have this pain".    Past Medical History:  Diagnosis Date   Allergy    Anemia    Arthritis    BPH (benign prostatic hyperplasia)    CKD (chronic kidney disease), stage III    Complication of anesthesia    COPD (chronic obstructive pulmonary disease) ([place])    a. Uses Home O2 w/ exertion.   Coronary artery disease    a. 11/2010 Cath: LM nl, LAD 60m (3.5x22 and 3.5x12 Resolute DES'), LCX nl, OM1/2/3 nl, RCA 20-30p, PDA nl.    Diabetes mellitus without complication (HCC)    GERD  (gastroesophageal reflux disease)    H/O echocardiogram    a. per notes in 11/2010 - nl EF, basal inf wma.   Heart murmur    History of kidney stones    Hyperlipidemia    Hypertensive heart disease    Hyperthyroidism    Neuropathy    Neuropathy    OSA (obstructive sleep apnea)    a. Uses CPAP.   PONV (postoperative nausea and vomiting)    Vertigo     Patient Active Problem List   Diagnosis Date Noted   IBS (irritable bowel syndrome) 05/15/2019   Acute bronchitis with COPD ([place]) 07/27/2018   Abnormal CT scan of lung 03/30/2017   Morbid obesity ([place]) 10/08/2015   Lower extremity edema 10/08/2015   S/P shoulder replacement 10/08/2015   Fracture, humerus, proximal 10/07/2015   Coronary artery disease    Hypertensive heart disease    CKD (chronic kidney disease), stage III    Hyperlipidemia    COPD (chronic obstructive pulmonary disease) ([place])    Morbid obesity due to excess calories ([place]) 02/25/2015   Dyspnea 02/04/2015   CKD (chronic kidney disease) 10/09/2014   Coronary artery disease due to lipid rich plaque 10/09/2014   Chronic respiratory failure with hypercapnia ([place]) 05/08/2014   Cellulitis of left lower extremity 02/20/2014   Leukocytosis, unspecified 02/20/2014   COPD exacerbation ([place]) 02/20/2014   Dyslipidemia 02/20/2014   CKD (chronic kidney disease) stage 4,  GFR 15-29 ml/min (HCC) 02/20/2014   Multiple pulmonary nodules 11/01/2013   Asthmatic bronchitis , chronic (HCC)   FEV1/FVC nl p bronchodilators  05/03/2013   Obstructive sleep apnea 05/02/2013    Past Surgical History:  Procedure Laterality Date   2 heart stents  12/18/10   CARDIAC CATHETERIZATION     CHOLECYSTECTOMY     COLONOSCOPY     REVERSE SHOULDER ARTHROPLASTY Right 10/08/2015   REVERSE SHOULDER ARTHROPLASTY Right 10/08/2015   Procedure: REVERSE SHOULDER ARTHROPLASTY;  Surgeon: Netta Cedars, MD;  Location: [facility];  Service: Orthopedics;  Laterality: Right;     right knee surgery Right 2008ish   "scraped it out"        Home Medications    Prior to Admission medications   Medication Sig Start Date End Date Taking? Authorizing Provider  acetaminophen (TYLENOL) 500 MG tablet Take 500 mg by mouth every 8 (eight) hours as needed for moderate pain.    Yes [provider]  albuterol (PROAIR HFA) 108 (90 Base) MCG/ACT inhaler 2 puffs every 4 hours as needed only  if your can't catch your breath 07/26/18  Yes Fenton Foy, NP  albuterol (PROVENTIL) (5 MG/ML) 0.5% nebulizer solution Take 0.5 mLs (2.5 mg total) by nebulization every 4 (four) hours as needed for wheezing or shortness of breath (((PLAN B))). 05/16/19  Yes Tanda Rockers, MD  aspirin EC 81 MG tablet Take 81 mg by mouth daily.   Yes [provider]  atorvastatin (LIPITOR) 40 MG tablet TAKE 1 TABLET BY MOUTH AT  BEDTIME Patient taking differently: Take 40 mg by mouth daily.  07/21/18  Yes Jerline Pain, MD  budesonide-formoterol (SYMBICORT) 160-4.5 MCG/ACT inhaler INHALE 2 PUFFS BY MOUTH  FIRST THING IN THE MORNING  AND THEN ANOTHER 2 PUFFS  ABOUT 12 HOURS LATER Patient taking differently: Inhale 2 puffs into the lungs every morning. INHALE 2 PUFFS BY MOUTH  FIRST THING IN THE MORNING 04/04/19  Yes Tanda Rockers, MD  cetirizine (ZYRTEC) 10 MG tablet Take 10 mg by mouth daily as needed for allergies.    Yes [provider]  colesevelam (WELCHOL) 625 MG tablet Take 625 mg by mouth 2 (two) times daily.   Yes [provider]  diltiazem (CARDIZEM SR) 90 MG 12 hr capsule Take 90 mg by mouth daily. 02/17/19  Yes [provider]  diltiazem (CARDIZEM) 60 MG tablet Take 60 mg by mouth 4 (four) times daily as needed (tachycardia).   Yes [provider]  diphenoxylate-atropine (LOMOTIL) 2.5-0.025 MG tablet Take 1 tablet by mouth 4 (four) times daily as needed for diarrhea or loose stools.  07/25/18  Yes [provider]  doxazosin (CARDURA) 8 MG  tablet Take 8 mg by mouth at bedtime.   Yes [provider]  famotidine (PEPCID) 20 MG tablet Take 20 mg by mouth at bedtime.   Yes [provider]  furosemide (LASIX) 20 MG tablet Take 2 tablets (40 mg total) by mouth daily. Please make yearly appt with Dr. [name] for November for future refills. 1st attempt 03/02/19  Yes Jerline Pain, MD  gabapentin (NEURONTIN) 300 MG capsule Take 1 capsule by mouth 2 (two) times daily. 07/25/18  Yes [provider]  hydrochlorothiazide (HYDRODIURIL) 25 MG tablet Take 12.5 mg by mouth daily.   Yes [provider]  JANUVIA 100 MG tablet Take 100 mg by mouth daily. 04/13/16  Yes [provider]  losartan (COZAAR) 100 MG tablet Take 100 mg  by mouth daily.   Yes [provider]  magnesium oxide (MAG-OX) 400 MG tablet Take 400 mg by mouth daily.   Yes [provider]  meclizine (ANTIVERT) 12.5 MG tablet Take 25 mg by mouth daily.  02/05/16  Yes [provider]  Multiple Vitamin (MULTIVITAMIN WITH MINERALS) TABS tablet Take 1 tablet by mouth at bedtime.    Yes [provider]  nitroGLYCERIN (NITROSTAT) 0.4 MG SL tablet Place 1 tablet (0.4 mg total) under the tongue every 5 (five) minutes as needed for chest pain. 11/14/18  Yes Jerline Pain, MD  oxybutynin (DITROPAN) 5 MG tablet Take 5 mg by mouth daily as needed for bladder spasms.   Yes [provider]  oxyCODONE-acetaminophen (PERCOCET/ROXICET) 5-325 MG tablet Take 2 tablets by mouth every 4 (four) hours as needed for severe pain. 05/22/19  Yes Robyn Haber, MD  pantoprazole (PROTONIX) 40 MG tablet TAKE 1 TABLET BY MOUTH  DAILY 12/09/18  Yes Tanda Rockers, MD  polyethylene glycol powder [facility]) 17 GM/SCOOP powder One capful in 8 oz water daily 05/22/19  Yes Robyn Haber, MD  potassium chloride SA (K-DUR,KLOR-CON) 20 MEQ tablet Take 1 tablet by mouth  daily 02/25/16  Yes Ria Bush, MD  Propylene Glycol (SYSTANE  BALANCE) 0.6 % SOLN Place 2 drops into both eyes 2 (two) times daily.   Yes [provider]  [facility] VERIO test strip U UTD ONCE A DAY 07/21/18   [provider]  OXYGEN Inhale 2 L into the lungs at bedtime.    [provider]  Respiratory Therapy Supplies (FLUTTER) DEVI Use as directed 02/04/15   Tanda Rockers, MD  SPIRIVA RESPIMAT 2.5 MCG/ACT AERS USE 2 SPRAYS(INHALATIONS)   BY MOUTH EVERY MORNING Patient not taking: No sig reported 04/04/19   Tanda Rockers, MD  UNABLE TO FIND Med Name: CPAP with 2lpm o2 with sleep    [provider]    Family History Family History  Problem Relation Age of Onset   Diabetes Mother    Heart disease Father    Diabetes Father    Heart disease Sister    Diabetes Sister     Social History Social History   Tobacco Use   Smoking status: Former Smoker    Packs/day: 1.50    Years: 15.00    Pack years: 22.50    Types: Cigarettes    Quit date: 04/29/1987    Years since quitting: 32.0   Smokeless tobacco: Never Used  Substance Use Topics   Alcohol use: Yes    Comment: Rare   Drug use: No     Allergies   Aspirin, Ibuprofen, Penicillins, and Sulfa antibiotics   Review of Systems Review of Systems  Constitutional: Negative for fever and weight loss.  HENT: Negative for congestion.   Eyes: Negative for visual disturbance.  Respiratory: Negative for cough, shortness of breath and wheezing.   Cardiovascular: Negative for chest pain, palpitations and leg swelling.  Gastrointestinal: Negative for abdominal pain and bowel incontinence.  Genitourinary: Negative for bladder incontinence, difficulty urinating, dysuria and pelvic pain.  Musculoskeletal: Positive for back pain. Negative for gait problem.  Skin: Negative for color change.  Neurological: Negative for tingling, weakness, numbness, headaches and paresthesias.  Psychiatric/Behavioral: Negative for agitation.  All other systems reviewed and are  negative.    Physical Exam Updated Vital Signs BP (!) 133/52    Pulse 79    Temp 98.8 F (37.1 C) (Oral)    Resp Marland Kitchen)  24    Ht 5\' 11"  (1.803 m)    Wt (!) 157.9 kg    SpO2 97%    BMI 48.54 kg/m   Physical Exam Vitals signs and nursing note reviewed.  Constitutional:      General: He is not in acute distress.    Appearance: Normal appearance.  HENT:     Head: Normocephalic and atraumatic.     Nose: Nose normal.  Eyes:     Conjunctiva/sclera: Conjunctivae normal.     Pupils: Pupils are equal, round, and reactive to light.  Neck:     Musculoskeletal: Normal range of motion and neck supple.  Cardiovascular:     Rate and Rhythm: Normal rate and regular rhythm.     Pulses: Normal pulses.     Heart sounds: Normal heart sounds.  Pulmonary:     Effort: Pulmonary effort is normal.     Breath sounds: Normal breath sounds.  Abdominal:     General: Abdomen is flat. Bowel sounds are normal.     Tenderness: There is no abdominal tenderness. There is no guarding or rebound.  Musculoskeletal: Normal range of motion.        General: No swelling or tenderness.     Right hip: Normal.     Left hip: Normal.     Thoracic back: Normal.     Lumbar back: Normal.     Right lower leg: No edema.     Left lower leg: No edema.  Skin:    General: Skin is warm and dry.     Capillary Refill: Capillary refill takes less than 2 seconds.  Neurological:     General: No focal deficit present.     Mental Status: He is alert and oriented to person, place, and time.     Deep Tendon Reflexes: Reflexes normal.  Psychiatric:        Mood and Affect: Mood normal.        Behavior: Behavior normal.      ED Treatments / Results  Labs (all labs ordered are listed, but only abnormal results are displayed) Results for orders placed or performed during the hospital encounter of 06/02/2019  Lipase, blood  Result Value Ref Range   Lipase 55 (H) 11 - 51 U/L  Comprehensive metabolic panel  Result Value Ref Range    Sodium 131 (L) 135 - 145 mmol/L   Potassium 4.5 3.5 - 5.1 mmol/L   Chloride 92 (L) 98 - 111 mmol/L   CO2 32 22 - 32 mmol/L   Glucose, Bld 121 (H) 70 - 99 mg/dL   BUN 30 (H) 8 - 23 mg/dL   Creatinine, Ser 2.18 (H) 0.61 - 1.24 mg/dL   Calcium 8.9 8.9 - 10.3 mg/dL   Total Protein 5.6 (L) 6.5 - 8.1 g/dL   Albumin 3.1 (L) 3.5 - 5.0 g/dL   AST 32 15 - 41 U/L   ALT 19 0 - 44 U/L   Alkaline Phosphatase 114 38 - 126 U/L   Total Bilirubin 1.1 0.3 - 1.2 mg/dL   GFR calc non Af Amer 29 (L) >60 mL/min   GFR calc Af Amer 33 (L) >60 mL/min   Anion gap 7 5 - 15  CBC  Result Value Ref Range   WBC 6.9 4.0 - 10.5 K/uL   RBC 4.19 (L) 4.22 - 5.81 MIL/uL   Hemoglobin 11.9 (L) 13.0 - 17.0 g/dL   HCT 38.3 (L) 39.0 - 52.0 %   MCV 91.4 80.0 -  100.0 fL   MCH 28.4 26.0 - 34.0 pg   MCHC 31.1 30.0 - 36.0 g/dL   RDW 16.8 (H) 11.5 - 15.5 %   Platelets 150 150 - 400 K/uL   nRBC 0.0 0.0 - 0.2 %  Urinalysis, Routine w reflex microscopic  Result Value Ref Range   Color, Urine YELLOW YELLOW   APPearance HAZY (A) CLEAR   Specific Gravity, Urine 1.017 1.005 - 1.030   pH 5.0 5.0 - 8.0   Glucose, UA NEGATIVE NEGATIVE mg/dL   Hgb urine dipstick NEGATIVE NEGATIVE   Bilirubin Urine NEGATIVE NEGATIVE   Ketones, ur NEGATIVE NEGATIVE mg/dL   Protein, ur NEGATIVE NEGATIVE mg/dL   Nitrite NEGATIVE NEGATIVE   Leukocytes,Ua NEGATIVE NEGATIVE   Ct Abdomen Pelvis W Contrast  Result Date: 05/01/2019 CLINICAL DATA:  2 week history of right upper quadrant pain. EXAM: CT ABDOMEN AND PELVIS WITH CONTRAST TECHNIQUE: Multidetector CT imaging of the abdomen and pelvis was performed using the standard protocol following bolus administration of intravenous contrast. CONTRAST:  19mL ISOVUE-300 IOPAMIDOL (ISOVUE-300) INJECTION 61% COMPARISON:  01/17/2018 FINDINGS: Lower chest: Tiny pulmonary nodules in the posterior right lung base measuring 6 mm on images 1 and 8 of series 3 are stable in the interval, likely benign. Atelectasis or  scarring is noted in the lung bases bilaterally. Hepatobiliary: No suspicious focal abnormality within the liver parenchyma. Gallbladder surgically absent. No intrahepatic or extrahepatic biliary dilation. Pancreas: No focal mass lesion. No dilatation of the main duct. No intraparenchymal cyst. No peripancreatic edema. Spleen: No splenomegaly. No focal mass lesion. Adrenals/Urinary Tract: No adrenal nodule or mass. Right kidney unremarkable. Similar appearance of a 6.5 cm interpolar left renal cyst. Smaller low-density 14 mm interpolar left renal lesion is also stable. No evidence for hydronephrosis. No evidence for hydroureter. Bladder is decompressed. Stomach/Bowel: Stomach is unremarkable. No gastric wall thickening. No evidence of outlet obstruction. Duodenum is normally positioned as is the ligament of Treitz. No small bowel wall thickening. No small bowel dilatation. The terminal ileum is normal. The appendix is not visualized, but there is no edema or inflammation in the region of the cecum. No gross colonic mass. No colonic wall thickening. Vascular/Lymphatic: There is abdominal aortic atherosclerosis without aneurysm. Interval development of lymphadenopathy in the lower mediastinum and abdomen. 13 mm right retrocrural node seen on 12/02. 2.4 cm short axis portal caval node visible on 30/2. 2.3 cm short axis aortocaval node visible on 38/2. 2.8 cm left para-aortic node identified on 50/2. Small lymph nodes are seen in the common iliac chains bilaterally. 15 mm short axis left common femoral node visible on 77/2. Reproductive: Prostate gland is enlarged. Other: No intraperitoneal free fluid. Musculoskeletal: Right groin hernia contains only fat. Multiple new subtle lucent lesions are identified in the bony anatomy including a 13 mm lesion in the L1 vertebral body (32/2), 1.9 cm lesion in the L2 vertebral body (40/2), subtle cortical lesion in the right iliac crest (59/2) and 10 mm sacral lesion on 69/2.  IMPRESSION: 1. Lower mediastinal and abdominal/pelvic lymphadenopathy. This could be related to lymphoma or metastatic disease, but given the interval development of multiple new lucent bone lesions, metastatic disease is favored. No evidence for a primary lesion on the current exam. 2.  Aortic Atherosclerois (ICD10-170.0) 3. Tiny pulmonary nodules in the right lung base are stable since prior study, suggesting benign etiology. These results will be called to the ordering clinician or representative by the Radiologist Assistant, and communication documented in the PACS or  [facility]Vision Dashboard. Electronically Signed   By: Misty Stanley M.D.   On: 05/01/2019 17:26   Nm Pet Image Initial (pi) Skull Base To Thigh  Result Date: 05/04/2019 CLINICAL DATA:  Initial staging, abdominal lymphadenopathy treatment strategy for abdominal lymphadenopathy. EXAM: NUCLEAR MEDICINE PET SKULL BASE TO THIGH TECHNIQUE: Sixteen mCi F-18 FDG was injected intravenously. Full-ring PET imaging was performed from the skull base to thigh after the radiotracer. CT data was obtained and used for attenuation correction and anatomic localization. Fasting blood glucose: 113 mg/dl COMPARISON:  None. FINDINGS: Mediastinal blood pool activity: SUV max 1.8 Liver activity: SUV max NA NECK: No hypermetabolic lymph nodes in the neck. Incidental CT findings: none CHEST: Cluster prevascular lymph nodes. Example lymph node measures 0.6 cm prevascular space with SUV max 10.7. Nodes extend to the high LEFT paratracheal nodal station but no clear supraclavicular hypermetabolic nodes. No hypermetabolic axillary nodes Incidental CT findings: Small RIGHT pleural effusion. Mild RIGHT basilar atelectasis. Nodule along the RIGHT pleural surface measures 8 mm (image 94/8 without metabolic activity. Second nodule along the posterior aspect of the LEFT upper lobe against the fissure measures 6 mm (image 57/4 also without metabolic activity. ABDOMEN/PELVIS: Cluster  hypermetabolic lymph nodes in the retroperitoneum along the aorta from the renal veins to the iliac arteries. Example LEFT aorta SUV max equal 6.2 measures 2 cm (image 143/4. Posterior the aorta SUV max equal 7.6. Noted within the mesentery on the SMA adjacent to the uncinate of the pancreas measures 2.2 cm SUV max 10.6. Smaller hypermetabolic nodes extend along the common iliac arteries. No hypermetabolic external iliac nodes or inguinal nodes. Spleen is normal metabolic activity normal volume. No abnormal activity in liver. Incidental CT findings: There is a lesion in the SKELETON: Along the posterior aspect of the T7 vertebral body there is a lytic lesion measuring 2 cm (image 80/4) which has mild metabolic activity (SUV max equal 6.0.) This lesion is new from CT September 22, 5460) Focal metabolic activity in the posterior LEFT iliac bone with SUV max equal 6.9. No clear CT abnormality at this level. Lytic lesion in the L1 vertebral body with mild metabolic activity (SUV max equal 5.7. Incidental CT findings: none IMPRESSION: 1. Hypermetabolic mediastinal prevascular nodes and hypermetabolic retroperitoneal and mesenteric nodes consistent with lymphoma versus granulomatous disease (sarcoidosis). 2. Lytic lesion in the T7 vertebral body with moderate metabolic activity is concerning for skeletal metastasis versus sarcoid lesion. 3. Small pulmonary nodules do not have associated metabolic activity. Recommend attention on follow-up. 4. Unfortunately, there are no large hypermetabolic supraclavicular or inguinal/iliac nodes for biopsy. The hypermetabolic T7 lesion is represented well on CT. The hypermetabolic LEFT iliac bone lesion is not well depicted on CT. Electronically Signed   By: Suzy Bouchard M.D.   On: 05/04/2019 13:06   Ct Biopsy  Result Date: 05/11/2019 INDICATION: Retroperitoneal adenopathy, concern for lymphoproliferative disorder EXAM: CT-GUIDED BIOPSY LEFT RETROPERITONEAL BULKY ADENOPATHY  MEDICATIONS: 1% lidocaine local ANESTHESIA/SEDATION: 0.5 mg IV Versed; 12.5 mcg IV Fentanyl Moderate Sedation Time:  13 minutes The patient was continuously monitored during the procedure by the interventional radiology nurse under my direct supervision. PROCEDURE: The procedure, risks, benefits, and alternatives were explained to the patient. Questions regarding the procedure were encouraged and answered. The patient understands and consents to the procedure. Previous imaging reviewed. Patient positioned prone. Noncontrast localization CT performed. The left bulky retroperitoneal periaortic adenopathy was localized and marked. Under sterile conditions and local anesthesia, a 17 gauge 16.8 cm guide was advanced to  the adenopathy. Needle position confirmed with CT. 18 gauge core biopsies obtained. Samples placed in saline. Needle removed. Postprocedure imaging demonstrates no hemorrhage or hematoma. Patient tolerated the procedure well without complication. Vital sign monitoring by nursing staff during the procedure will continue as patient is in the special procedures unit for post procedure observation. FINDINGS: The images document guide needle placement within the left retroperitoneal periaortic adenopathy. Post biopsy images demonstrate no hemorrhage or hematoma. COMPLICATIONS: None immediate. IMPRESSION: Successful CT-guided core biopsy of the left retroperitoneal periaortic adenopathy Electronically Signed   By: [name].  Shick M.D.   On: 05/11/2019 14:24   Ct Renal Stone Study  Result Date: 05/25/2019 CLINICAL DATA:  Low back pain, generalized abdominal pain EXAM: CT ABDOMEN AND PELVIS WITHOUT CONTRAST TECHNIQUE: Multidetector CT imaging of the abdomen and pelvis was performed following the standard protocol without IV contrast. COMPARISON:  08/31/2018 FINDINGS: Lower chest: small bilateral pleural effusions, new since prior study. Small nodules in the right lower lobe are stable. Bibasilar atelectasis. Heart  is normal size. Mediastinal adenopathy partially imaged on the upper images as seen on prior study. Heart is normal size. Hepatobiliary: No focal liver abnormality is seen. Status post cholecystectomy. No biliary dilatation. Pancreas: No focal abnormality or ductal dilatation. Spleen: Spleen mildly enlarged with a craniocaudal length of 13.9 cm. This is stable when compared to prior study. No focal splenic abnormality. Adrenals/Urinary Tract: Left renal cyst measures 6 cm. No hydronephrosis. Adrenal glands and urinary bladder unremarkable. Stomach/Bowel: Appendix normal. Stomach, large and small bowel grossly unremarkable. Vascular/Lymphatic: Aortic atherosclerosis. No aneurysm. Extensive retroperitoneal adenopathy. This continues into the pelvis/iliac chains. This is also noted in the retrocrural region. This is unchanged since prior study. Reproductive: Mild prostate prominence. Other: No free fluid or free air. There is extensive stranding/soft tissue density noted in the upper retroperitoneum around the adrenal glands. This has progressed significantly since prior study and is of unknown etiology. Musculoskeletal: Lytic lesion within the T7 vertebral body is stable. Lytic lesion within the L2 vertebral body also stable. IMPRESSION: Extensive retroperitoneal adenopathy in the abdomen and pelvis. Adenopathy in the visualized lower mediastinum. This is unchanged since prior study and could reflect lymphoma or metastatic disease. Extensive stranding in the retroperitoneum in the upper abdomen around the adrenal glands. This is of unknown etiology, progressed since prior study. Small bilateral pleural effusions, new since prior study. Stable small lower lobe pulmonary nodules. Bibasilar atelectasis. Splenomegaly, stable. Aortic atherosclerosis. Stable lytic lesions in the T7 and L2 vertebral bodies. Electronically Signed   By: Rolm Baptise M.D.   On: 05/25/2019 00:04  ]  EKG None  Radiology No results  found.  Procedures Procedures (including critical care time)  Medications Ordered in ED Medications  lidocaine (LIDODERM) 5 % 3 patch (3 patches Transdermal Patch Applied 06/12/2019 2357)  acetaminophen (TYLENOL) tablet 1,000 mg (1,000 mg Oral Given 06/16/2019 2330)     Initial Impression / Assessment and Plan / ED Course  EDP stated the pain was from lytic lesions to bone from the patient's known cancer.  He will need to start treatment with oncology and   Final Clinical Impressions(s) / ED Diagnoses   Will admit for pain control and to start treatment.     Lirio Bach, MD 05/25/19 0263

## 2019-05-25 NOTE — Progress Notes (Signed)
Lovenox per Pharmacy for DVT Prophylaxis    Pharmacy has been consulted from dosing enoxaparin (lovenox) in this patient for DVT prophylaxis.  The pharmacist has reviewed pertinent labs (Hgb _12__; PLT_153__), patient weight (__157_kg) and renal function (CrCl__45_mL/min) and decided that enoxaparin _60_mg SQ Q24Hrs is appropriate for this patient.  The pharmacy department will sign off at this time.  Please reconsult pharmacy if status changes or for further issues.  Thank you  Cyndia Diver PharmD, BCPS  05/25/2019, 2:31 AM

## 2019-05-25 NOTE — H&P (Addendum)
TRH H&P    Patient Demographics:    Shane Watson, is a 73 y.o. male  MRN: [id number]  DOB - August 19, 1945  Admit Date - 06/16/2019  Referring MD/NP/PA:   Randal Buba  Outpatient Primary MD for the patient is Shirline Frees, MD  Patient coming from:  home  Chief complaint-  Back pain    HPI:    Shane Watson  is a 73 y.o. male, hypertension, hyeprlipidemia, Dm2, w neuropathy, Ckd stage 3, CAD, Anemia, Bph,   c/o back pain radiating to the left anterior abdomen x 3-4 weeks.  His pcp sent him for CT scan and then went to get PET scan and then a biopsy.  Pt presented tonight due to back pain, worse since the biopsy.  Pt notes his abdomen has been getting bigger since the pandemic began. Pt has had some lower extremity edema worse for the past 3-4 weeks.  Pt states has had prior colonoscopy about 3 years ago, negative  In ED,  T 98.8, P 77 R 17, Bp 139/54, pox 91% on RA Wt 157.9kg  CT abd/ pelvis IMPRESSION: Extensive retroperitoneal adenopathy in the abdomen and pelvis. Adenopathy in the visualized lower mediastinum. This is unchanged since prior study and could reflect lymphoma or metastatic disease.  Extensive stranding in the retroperitoneum in the upper abdomen around the adrenal glands. This is of unknown etiology, progressed since prior study.  Small bilateral pleural effusions, new since prior study. Stable small lower lobe pulmonary nodules. Bibasilar atelectasis.  Splenomegaly, stable.  Aortic atherosclerosis.  Stable lytic lesions in the T7 and L2 vertebral bodies.  Wbc 7.8, Hgb 12.0, Plt 153 Na 131, K 4.4, Bun 30, Creatinine 2.18 Ast 32, Alt 19, Alk phos 114, T. Bili 1.1  Pt will be admitted for intractable back pain and ARF    Review of systems:    In addition to the HPI above,  No Fever-chills, No Headache, No changes with Vision or hearing, No problems swallowing food or  Liquids, No Chest pain, Cough or Shortness of Breath,   No Nausea or Vomiting, bowel movements are regular, No Blood in stool or Urine, No dysuria, No new skin rashes or bruises, No new joints pains-aches,  No new weakness, tingling, numbness in any extremity, No recent weight gain or loss, No polyuria, polydypsia or polyphagia, No significant Mental Stressors.  All other systems reviewed and are negative.    Past History of the following :    Past Medical History:  Diagnosis Date   Allergy    Anemia    Arthritis    BPH (benign prostatic hyperplasia)    CKD (chronic kidney disease), stage III    Complication of anesthesia    COPD (chronic obstructive pulmonary disease) ([place])    a. Uses Home O2 w/ exertion.   Coronary artery disease    a. 11/2010 Cath: LM nl, LAD 39m(3.5x22 and 3.5x12 Resolute DES'), LCX nl, OM1/2/3 nl, RCA 20-30p, PDA nl.    Diabetes mellitus without complication (HCC)    GERD (gastroesophageal reflux disease)  H/O echocardiogram    a. per notes in 11/2010 - nl EF, basal inf wma.   Heart murmur    History of kidney stones    Hyperlipidemia    Hypertensive heart disease    Hyperthyroidism    Neuropathy    Neuropathy    OSA (obstructive sleep apnea)    a. Uses CPAP.   PONV (postoperative nausea and vomiting)    Vertigo       Past Surgical History:  Procedure Laterality Date   2 heart stents  12/18/10   CARDIAC CATHETERIZATION     CHOLECYSTECTOMY     COLONOSCOPY     REVERSE SHOULDER ARTHROPLASTY Right 10/08/2015   REVERSE SHOULDER ARTHROPLASTY Right 10/08/2015   Procedure: REVERSE SHOULDER ARTHROPLASTY;  Surgeon: Netta Cedars, MD;  Location: [facility];  Service: Orthopedics;  Laterality: Right;   right knee surgery Right 2008ish   "scraped it out"      Social History:      Social History   Tobacco Use   Smoking status: Former Smoker    Packs/day: 1.50    Years: 15.00    Pack years: 22.50    Types: Cigarettes     Quit date: 04/29/1987    Years since quitting: 32.0   Smokeless tobacco: Never Used  Substance Use Topics   Alcohol use: Yes    Comment: Rare       Family History :     Family History  Problem Relation Age of Onset   Diabetes Mother    Heart disease Father    Diabetes Father    Heart disease Sister    Diabetes Sister        Home Medications:   Prior to Admission medications   Medication Sig Start Date End Date Taking? Authorizing Provider  acetaminophen (TYLENOL) 500 MG tablet Take 500 mg by mouth every 8 (eight) hours as needed for moderate pain.    Yes [provider]  albuterol (PROAIR HFA) 108 (90 Base) MCG/ACT inhaler 2 puffs every 4 hours as needed only  if your can't catch your breath 07/26/18  Yes Fenton Foy, NP  albuterol (PROVENTIL) (5 MG/ML) 0.5% nebulizer solution Take 0.5 mLs (2.5 mg total) by nebulization every 4 (four) hours as needed for wheezing or shortness of breath (((PLAN B))). 05/16/19  Yes Tanda Rockers, MD  aspirin EC 81 MG tablet Take 81 mg by mouth daily.   Yes [provider]  atorvastatin (LIPITOR) 40 MG tablet TAKE 1 TABLET BY MOUTH AT  BEDTIME Patient taking differently: Take 40 mg by mouth daily.  07/21/18  Yes Jerline Pain, MD  budesonide-formoterol (SYMBICORT) 160-4.5 MCG/ACT inhaler INHALE 2 PUFFS BY MOUTH  FIRST THING IN THE MORNING  AND THEN ANOTHER 2 PUFFS  ABOUT 12 HOURS LATER Patient taking differently: Inhale 2 puffs into the lungs every morning. INHALE 2 PUFFS BY MOUTH  FIRST THING IN THE MORNING 04/04/19  Yes Tanda Rockers, MD  cetirizine (ZYRTEC) 10 MG tablet Take 10 mg by mouth daily as needed for allergies.    Yes [provider]  colesevelam (WELCHOL) 625 MG tablet Take 625 mg by mouth 2 (two) times daily.   Yes [provider]  diltiazem (CARDIZEM SR) 90 MG 12 hr capsule Take 90 mg by mouth daily. 02/17/19  Yes [provider]  diltiazem (CARDIZEM) 60 MG tablet Take 60 mg  by mouth 4 (four) times daily as needed (tachycardia).   Yes [provider]  diphenoxylate-atropine (LOMOTIL) 2.5-0.025 MG tablet Take 1 tablet by mouth 4 (four) times daily as needed for diarrhea or loose stools.  07/25/18  Yes [provider]  doxazosin (CARDURA) 8 MG tablet Take 8 mg by mouth at bedtime.   Yes [provider]  famotidine (PEPCID) 20 MG tablet Take 20 mg by mouth at bedtime.   Yes [provider]  furosemide (LASIX) 20 MG tablet Take 2 tablets (40 mg total) by mouth daily. Please make yearly appt with Dr. [name] for November for future refills. 1st attempt 03/02/19  Yes Jerline Pain, MD  gabapentin (NEURONTIN) 300 MG capsule Take 1 capsule by mouth 2 (two) times daily. 07/25/18  Yes [provider]  hydrochlorothiazide (HYDRODIURIL) 25 MG tablet Take 12.5 mg by mouth daily.   Yes [provider]  JANUVIA 100 MG tablet Take 100 mg by mouth daily. 04/13/16  Yes [provider]  losartan (COZAAR) 100 MG tablet Take 100 mg by mouth daily.   Yes [provider]  magnesium oxide (MAG-OX) 400 MG tablet Take 400 mg by mouth daily.   Yes [provider]  meclizine (ANTIVERT) 12.5 MG tablet Take 25 mg by mouth daily.  02/05/16  Yes [provider]  Multiple Vitamin (MULTIVITAMIN WITH MINERALS) TABS tablet Take 1 tablet by mouth at bedtime.    Yes [provider]  nitroGLYCERIN (NITROSTAT) 0.4 MG SL tablet Place 1 tablet (0.4 mg total) under the tongue every 5 (five) minutes as needed for chest pain. 11/14/18  Yes Jerline Pain, MD  oxybutynin (DITROPAN) 5 MG tablet Take 5 mg by mouth daily as needed for bladder spasms.   Yes [provider]  oxyCODONE-acetaminophen (PERCOCET/ROXICET) 5-325 MG tablet Take 2 tablets by mouth every 4 (four) hours as needed for severe pain. 05/22/19  Yes Robyn Haber, MD  pantoprazole (PROTONIX) 40 MG tablet TAKE 1 TABLET BY MOUTH  DAILY 12/09/18  Yes Tanda Rockers, MD  polyethylene glycol powder [facility]) 17 GM/SCOOP powder One capful in 8 oz water daily 05/22/19  Yes Robyn Haber, MD  potassium chloride SA (K-DUR,KLOR-CON) 20 MEQ tablet Take 1 tablet by mouth  daily 02/25/16  Yes Ria Bush, MD  Propylene Glycol (SYSTANE BALANCE) 0.6 % SOLN Place 2 drops into both eyes 2 (two) times daily.   Yes [provider]  [facility] VERIO test strip U UTD ONCE A DAY 07/21/18   [provider]  OXYGEN Inhale 2 L into the lungs at bedtime.    [provider]  Respiratory Therapy Supplies (FLUTTER) DEVI Use as directed 02/04/15   Tanda Rockers, MD  SPIRIVA RESPIMAT 2.5 MCG/ACT AERS USE 2 SPRAYS(INHALATIONS)   BY MOUTH EVERY MORNING Patient not taking: No sig reported 04/04/19   Tanda Rockers, MD  UNABLE TO FIND Med Name: CPAP with 2lpm o2 with sleep    [provider]     Allergies:     Allergies  Allergen Reactions   Aspirin Swelling    Angioedema-must take EC coated form only   Ibuprofen Swelling    Angioedema    Penicillins Hives    Did it involve swelling of the face/tongue/throat, SOB, or low BP? No Did it involve sudden or severe rash/hives, skin peeling, or any reaction on the inside of your mouth or nose? Yes Did you need to seek medical attention at a hospital or doctor's office? Yes When did it last happen?childhood  If all above answers are NO, may  proceed with cephalosporin use.    Sulfa Antibiotics Hives     Physical Exam:   Vitals  Blood pressure (!) 143/40, pulse 75, temperature 98.5 F (36.9 C), temperature source Oral, resp. rate 20, height '5\' 11"'  (1.803 m), weight (!) 159.7 kg, SpO2 96 %.  1.  General: axoxo3  2. Psychiatric: euthymic  3. Neurologic: cn2-12 intact, reflexes 2+ symmetric, diffuse with no clonus, motor 5/5 in all 4 ext  4. HEENMT:  Anicteric,  Pupils 1.32m symmetric, direct, consensual, near intact NEck: no jvd  5. Respiratory : CTAB  6.  Cardiovascular : rrr s1, s2, no m/g/r  7. Gastrointestinal:  Abd: soft, morbidly obese, nt, nd, +bs  8. Skin:  Ext: no c/c/e,  No rash  9.Musculoskeletal:  Good ROM    Data Review:    CBC Recent Labs  Lab 05/22/2019 2057 05/25/19 0109  WBC 6.9 7.8  HGB 11.9* 12.0*  HCT 38.3* 38.4*  PLT 150 153  MCV 91.4 92.1  MCH 28.4 28.8  MCHC 31.1 31.3  RDW 16.8* 17.0*   ------------------------------------------------------------------------------------------------------------------  Results for orders placed or performed during the hospital encounter of 05/30/2019 (from the past 48 hour(s))  Lipase, blood     Status: Abnormal   Collection Time: 06/02/2019  8:57 PM  Result Value Ref Range   Lipase 55 (H) 11 - 51 U/L    Comment: Performed at [facility] [address] [place] [id number] Comprehensive metabolic panel     Status: Abnormal   Collection Time: 06/18/2019  8:57 PM  Result Value Ref Range   Sodium 131 (L) 135 - 145 mmol/L   Potassium 4.5 3.5 - 5.1 mmol/L   Chloride 92 (L) 98 - 111 mmol/L   CO2 32 22 - 32 mmol/L   Glucose, Bld 121 (H) 70 - 99 mg/dL   BUN 30 (H) 8 - 23 mg/dL   Creatinine, Ser 2.18 (H) 0.61 - 1.24 mg/dL   Calcium 8.9 8.9 - 10.3 mg/dL   Total Protein 5.6 (L) 6.5 - 8.1 g/dL   Albumin 3.1 (L) 3.5 - 5.0 g/dL   AST 32 15 - 41 U/L   ALT 19 0 - 44 U/L   Alkaline Phosphatase 114 38 - 126 U/L   Total Bilirubin 1.1 0.3 - 1.2 mg/dL   GFR calc non Af Amer 29 (L) >60 mL/min   GFR calc Af Amer 33 (L) >60 mL/min   Anion gap 7 5 - 15    Comment: Performed at [facility] [address] [place] [id number] CBC     Status: Abnormal   Collection Time: 05/28/2019  8:57 PM  Result Value Ref Range   WBC 6.9 4.0 - 10.5 K/uL   RBC 4.19 (L) 4.22 - 5.81 MIL/uL   Hemoglobin 11.9 (L) 13.0 - 17.0 g/dL   HCT 38.3 (L) 39.0 - 52.0 %   MCV 91.4 80.0 - 100.0 fL   MCH 28.4 26.0 - 34.0 pg   MCHC 31.1 30.0 - 36.0 g/dL   RDW  16.8 (H) 11.5 - 15.5 %   Platelets 150 150 - 400 K/uL   nRBC 0.0 0.0 - 0.2 %    Comment: Performed at [facility] [address], [place] [address] Urinalysis, Routine w reflex microscopic     Status: Abnormal   Collection Time: 06/18/2019 10:12 PM  Result Value Ref Range   Color, Urine YELLOW YELLOW   APPearance  HAZY (A) CLEAR   Specific Gravity, Urine 1.017 1.005 - 1.030   pH 5.0 5.0 - 8.0   Glucose, UA NEGATIVE NEGATIVE mg/dL   Hgb urine dipstick NEGATIVE NEGATIVE   Bilirubin Urine NEGATIVE NEGATIVE   Ketones, ur NEGATIVE NEGATIVE mg/dL   Protein, ur NEGATIVE NEGATIVE mg/dL   Nitrite NEGATIVE NEGATIVE   Leukocytes,Ua NEGATIVE NEGATIVE    Comment: Performed at [facility] [address] [place], [place] [address]  Comprehensive metabolic panel     Status: Abnormal   Collection Time: 05/25/19  1:09 AM  Result Value Ref Range   Sodium 131 (L) 135 - 145 mmol/L   Potassium 4.4 3.5 - 5.1 mmol/L   Chloride 93 (L) 98 - 111 mmol/L   CO2 30 22 - 32 mmol/L   Glucose, Bld 129 (H) 70 - 99 mg/dL   BUN 35 (H) 8 - 23 mg/dL   Creatinine, Ser 2.19 (H) 0.61 - 1.24 mg/dL   Calcium 8.9 8.9 - 10.3 mg/dL   Total Protein 5.6 (L) 6.5 - 8.1 g/dL   Albumin 3.3 (L) 3.5 - 5.0 g/dL   AST 32 15 - 41 U/L   ALT 19 0 - 44 U/L   Alkaline Phosphatase 109 38 - 126 U/L   Total Bilirubin 1.5 (H) 0.3 - 1.2 mg/dL   GFR calc non Af Amer 29 (L) >60 mL/min   GFR calc Af Amer 33 (L) >60 mL/min   Anion gap 8 5 - 15    Comment: Performed at [facility], [place] [address], [place], [place] [address]  CBC     Status: Abnormal   Collection Time: 05/25/19  1:09 AM  Result Value Ref Range   WBC 7.8 4.0 - 10.5 K/uL   RBC 4.17 (L) 4.22 - 5.81 MIL/uL   Hemoglobin 12.0 (L) 13.0 - 17.0 g/dL   HCT 38.4 (L) 39.0 - 52.0 %   MCV 92.1 80.0 - 100.0 fL   MCH 28.8 26.0 - 34.0 pg   MCHC 31.3 30.0 - 36.0 g/dL   RDW 17.0 (H) 11.5 - 15.5 %   Platelets 153 150 -  400 K/uL   nRBC 0.0 0.0 - 0.2 %    Comment: Performed at [facility], [address] [place], [place] [address]    Chemistries  Recent Labs  Lab 06/19/2019 2057 05/25/19 0109  NA 131* 131*  K 4.5 4.4  CL 92* 93*  CO2 32 30  GLUCOSE 121* 129*  BUN 30* 35*  CREATININE 2.18* 2.19*  CALCIUM 8.9 8.9  AST 32 32  ALT 19 19  ALKPHOS 114 109  BILITOT 1.1 1.5*   ------------------------------------------------------------------------------------------------------------------  ------------------------------------------------------------------------------------------------------------------ GFR: Estimated Creatinine Clearance: 45.7 mL/min (A) (by C-G formula based on SCr of 2.19 mg/dL (H)). Liver Function Tests: Recent Labs  Lab 06/05/2019 2057 05/25/19 0109  AST 32 32  ALT 19 19  ALKPHOS 114 109  BILITOT 1.1 1.5*  PROT 5.6* 5.6*  ALBUMIN 3.1* 3.3*   Recent Labs  Lab 06/12/2019 2057  LIPASE 55*   No results for input(s): AMMONIA in the last 168 hours. Coagulation Profile: No results for input(s): INR, PROTIME in the last 168 hours. Cardiac Enzymes: No results for input(s): CKTOTAL, CKMB, CKMBINDEX, TROPONINI in the last 168 hours. BNP (last 3 results) No results for input(s): PROBNP in the last 8760 hours. HbA1C: No results for input(s): HGBA1C in the last 72 hours. CBG: No results for input(s): GLUCAP in the last  168 hours. Lipid Profile: No results for input(s): CHOL, HDL, LDLCALC, TRIG, CHOLHDL, LDLDIRECT in the last 72 hours. Thyroid Function Tests: No results for input(s): TSH, T4TOTAL, FREET4, T3FREE, THYROIDAB in the last 72 hours. Anemia Panel: No results for input(s): VITAMINB12, FOLATE, FERRITIN, TIBC, IRON, RETICCTPCT in the last 72 hours.  --------------------------------------------------------------------------------------------------------------- Urine analysis:    Component Value Date/Time   COLORURINE YELLOW 06/18/2019 2212    APPEARANCEUR HAZY (A) 06/11/2019 2212   LABSPEC 1.017 06/09/2019 2212   PHURINE 5.0 06/02/2019 2212   GLUCOSEU NEGATIVE 06/19/2019 2212   HGBUR NEGATIVE 05/31/2019 2212   BILIRUBINUR NEGATIVE 06/16/2019 2212   KETONESUR NEGATIVE 06/12/2019 2212   PROTEINUR NEGATIVE 05/31/2019 2212   UROBILINOGEN 1.0 02/20/2014 1809   NITRITE NEGATIVE 06/17/2019 2212   LEUKOCYTESUR NEGATIVE 06/14/2019 2212      Imaging Results:    Ct Renal Stone Study  Result Date: 05/25/2019 CLINICAL DATA:  Low back pain, generalized abdominal pain EXAM: CT ABDOMEN AND PELVIS WITHOUT CONTRAST TECHNIQUE: Multidetector CT imaging of the abdomen and pelvis was performed following the standard protocol without IV contrast. COMPARISON:  08/31/2018 FINDINGS: Lower chest: small bilateral pleural effusions, new since prior study. Small nodules in the right lower lobe are stable. Bibasilar atelectasis. Heart is normal size. Mediastinal adenopathy partially imaged on the upper images as seen on prior study. Heart is normal size. Hepatobiliary: No focal liver abnormality is seen. Status post cholecystectomy. No biliary dilatation. Pancreas: No focal abnormality or ductal dilatation. Spleen: Spleen mildly enlarged with a craniocaudal length of 13.9 cm. This is stable when compared to prior study. No focal splenic abnormality. Adrenals/Urinary Tract: Left renal cyst measures 6 cm. No hydronephrosis. Adrenal glands and urinary bladder unremarkable. Stomach/Bowel: Appendix normal. Stomach, large and small bowel grossly unremarkable. Vascular/Lymphatic: Aortic atherosclerosis. No aneurysm. Extensive retroperitoneal adenopathy. This continues into the pelvis/iliac chains. This is also noted in the retrocrural region. This is unchanged since prior study. Reproductive: Mild prostate prominence. Other: No free fluid or free air. There is extensive stranding/soft tissue density noted in the upper retroperitoneum around the adrenal glands. This has  progressed significantly since prior study and is of unknown etiology. Musculoskeletal: Lytic lesion within the T7 vertebral body is stable. Lytic lesion within the L2 vertebral body also stable. IMPRESSION: Extensive retroperitoneal adenopathy in the abdomen and pelvis. Adenopathy in the visualized lower mediastinum. This is unchanged since prior study and could reflect lymphoma or metastatic disease. Extensive stranding in the retroperitoneum in the upper abdomen around the adrenal glands. This is of unknown etiology, progressed since prior study. Small bilateral pleural effusions, new since prior study. Stable small lower lobe pulmonary nodules. Bibasilar atelectasis. Splenomegaly, stable. Aortic atherosclerosis. Stable lytic lesions in the T7 and L2 vertebral bodies. Electronically Signed   By: Rolm Baptise M.D.   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  Dilaudid 51m iv q4h prn pain Please consult oncology in AM  CAD Cont Aspirin  Cont Lipitor  42m po qhs Cont Welchol Cont Cardizem   Dm2 Cont Januvia => Tradjenta Fsbs ac and qhs, ISS  Diabetic neuropathy Cont Gabapentin  Hypertension Hold hydrochlorothiazide as above Hold Losartan as above  Copd Cont Symbicort 2puff bid Cont Albuterol HFA 2puff q4h prn   Gerd Cont pepcid  BPH Cont Cardura  DVT Prophylaxis-   Lovenox - SCDs   AM Labs Ordered, also please review Full Orders  Family Communication: Admission,  patients condition and plan of care including tests being ordered have been discussed with the patient  who indicate understanding and agree with the plan and Code Status.  Code Status:  FULL CODE per patient  Admission status: Observation: Based on patients clinical presentation and evaluation of above clinical data, I have made determination that patient meets observation criteria at this time.   Time spent in minutes : 70 minutes   JJani GravelM.D on 05/25/2019 at 5:09 AM

## 2019-05-25 NOTE — Consult Note (Addendum)
[place]  [phone] [phone]   MEDICAL ONCOLOGY - INITIAL CONSULTATION  Referral MD: Dr. Guilford Shi  Reason for Referral: Metastatic adenocarcinoma  HPI: Mr. Shane Watson is a 73 year old male with a past medical history significant for hypertension, hyperlipidemia, diabetes with peripheral neuropathy, CKD stage III, CAD, anemia, BPH.  The patient presented to the emergency room with back pain and worsening lower extremity edema.  The patient had been having back pain for at least 3 to 4 weeks.  His back pain radiated to his left anterior abdomen and his PCP sent him for a CT scan of the abdomen pelvis with contrast on 05/01/2019 which showed lower mediastinal and abdominal/pelvic lymphadenopathy which could be related to lymphoma or metastatic disease.  The CT showed multiple lucent bone lesions and therefore metastatic disease is favored.  There was no evidence of a primary lesion on the CT scan.  He also has some tiny pulmonary nodules in the right lung base which were stable since the prior study.  Thereafter, he had a PET scan performed on 05/04/2019.  PET scan results showed hypermetabolic mediastinal prevascular nodes and hypermetabolic retroperitoneal and mesenteric nodes consistent with lymphoma versus granulomatous disease (sarcoidosis), lytic lesion in the T7 vertebral body with moderate metabolic activity concerning for skeletal metastasis versus sarcoid lesion, small pulmonary nodules do not have associated metabolic activity, there were no large hypermetabolic supraclavicular or inguinal/iliac nodes for biopsy.  He had a CT-guided biopsy of his left retroperitoneal lymph node by interventional radiology on 05/11/2019.  Biopsy results showed poorly differentiated adenocarcinoma with no lymphoid tissue identified.  Immunohistochemical staining was positive for Pancytokeratin (AE1/AE3), CK20 (rare cells - possibly nonspecific) and negative for CK7,  CK5/6, TTF-1, CDX2, CD45, PSA, prostein, GATA3, calretinin and WT1.  Mucicarmine special stain is positive.  The staining profile is nonspecific.    In the emergency room, he had a CT renal stone study which showed extensive retroperitoneal adenopathy in the abdomen pelvis, adenopathy in the visualized lower mediastinum, this is unchanged from the prior CT scan, extensive stranding in the retroperitoneum in the upper abdomen around the adrenal glands  -this is of unknown etiology and has progressed since the prior study, small bilateral pleural effusions which are new since the prior study, stable small lower lobe pulmonary nodules, splenomegaly which is stable, stable lytic lesions at T7 and L2 vertebral bodies.  CBC on admission showed mild anemia with a hemoglobin of 12.0.  CMET showed sodium of 131, BUN 35, creatinine 2.19, total protein 5.6, albumin 3.3.  LDH obtained on admission was mildly elevated at 209.  PSA was normal at 2.61.  A CEA, SPEP, and UPEP are currently pending.  The patient states today that his back pain started about a month ago and got progressively worse.  He had the work-up as outlined above by his primary care provider.  He has been referred to the cancer center and was scheduled to see [place] on 05/30/2019.  However, his back pain worsened to the point that he could not bear it despite receiving oxycodone from urgent care.  Pain worse with movement.  He also reports that his lower extremity edema has worsened over the past month.  He states that he has had some lower extremity edema for many years and has been on Lasix.  However, the lower extremity edema he currently has a significantly worse.  The patient denies fevers and chills.  Denies headaches and dizziness.  Reports anorexia but states that he  is actually gained weight.  Denies night sweats.  He denies chest pain.  Reports shortness of breath with exertion and feels as though he is "gasping" for air at times.  Reports his abdomen  feels swollen.  Denies abdominal pain, nausea, vomiting.  He has had some constipation since being on pain medication.  Reports a small amount of blood on the toilet paper after a bowel movement.  Other than that, he has not noticed any bleeding. The patient states that he has last colonoscopy at age 79 which he reports as normal.  States he was referred back for colonoscopy at age 57 and was told he did not need a repeat colonoscopy given his age.  However, he states that he did a home test for colon cancer which he reports is negative.  He thinks test was performed 2 to 3 years ago.  The patient also tells me that he has been seen by urology in the past for an enlarged prostate.  He has never had an elevated PSA and has never had a prostate biopsy.  The patient is divorced and has 1 son who lives in [place].  Denies alcohol use.  Previously smoked 1-1/2 pack of cigarettes per day for 15 years.  He quit in 1988.  Denies family history of cancer and blood disorders. Medical oncology was asked see the patient to make recommendations regarding his metastatic, poorly differentiated adenocarcinoma.   Past Medical History:  Diagnosis Date  . Allergy   . Anemia   . Arthritis   . BPH (benign prostatic hyperplasia)   . CKD (chronic kidney disease), stage III   . Complication of anesthesia   . COPD (chronic obstructive pulmonary disease) ([facility])    a. Uses Home O2 w/ exertion.  . Coronary artery disease    a. 11/2010 Cath: LM nl, LAD 27m (3.5x22 and 3.5x12 Resolute DES'), LCX nl, OM1/2/3 nl, RCA 20-30p, PDA nl.   . Diabetes mellitus without complication ([facility])   . GERD (gastroesophageal reflux disease)   . H/O echocardiogram    a. per notes in 11/2010 - nl EF, basal inf wma.  . Heart murmur   . History of kidney stones   . Hyperlipidemia   . Hypertensive heart disease   . Hyperthyroidism   . Neuropathy   . Neuropathy   . OSA (obstructive sleep apnea)    a. Uses CPAP.  Marland Kitchen PONV (postoperative nausea and  vomiting)   . Vertigo   :  Past Surgical History:  Procedure Laterality Date  . 2 heart stents  12/18/10  . CARDIAC CATHETERIZATION    . CHOLECYSTECTOMY    . COLONOSCOPY    . REVERSE SHOULDER ARTHROPLASTY Right 10/08/2015  . REVERSE SHOULDER ARTHROPLASTY Right 10/08/2015   Procedure: REVERSE SHOULDER ARTHROPLASTY;  Surgeon: Netta Cedars, MD;  Location: [facility];  Service: Orthopedics;  Laterality: Right;  . right knee surgery Right 2008ish   "scraped it out"  :  Current Facility-Administered Medications  Medication Dose Route Frequency Provider Last Rate Last Dose  . acetaminophen (TYLENOL) tablet 650 mg  650 mg Oral Q6H PRN Jani Gravel, MD       Or  . acetaminophen (TYLENOL) suppository 650 mg  650 mg Rectal Q6H PRN Jani Gravel, MD      . albuterol (PROVENTIL) (2.5 MG/3ML) 0.083% nebulizer solution 3 mL  3 mL Inhalation Q4H PRN Jani Gravel, MD      . aspirin EC tablet 81 mg  81 mg Oral Daily Jani Gravel,  MD   81 mg at 05/25/19 1008  . atorvastatin (LIPITOR) tablet 40 mg  40 mg Oral Daily Jani Gravel, MD   40 mg at 05/25/19 1008  . [facility]) tablet 625 mg  625 mg Oral BID WC Jani Gravel, MD   625 mg at 05/25/19 1007  . diltiazem (CARDIZEM SR) 12 hr capsule 90 mg  90 mg Oral Daily Jani Gravel, MD   90 mg at 05/25/19 1008  . diltiazem (CARDIZEM) tablet 60 mg  60 mg Oral QID PRN Jani Gravel, MD      . diphenoxylate-atropine (LOMOTIL) 2.5-0.025 MG per tablet 1 tablet  1 tablet Oral QID PRN Jani Gravel, MD      . doxazosin (CARDURA) tablet 8 mg  8 mg Oral QHS Jani Gravel, MD      . enoxaparin (LOVENOX) injection 60 mg  60 mg Subcutaneous Q24H Jani Gravel, MD   60 mg at 05/25/19 1008  . famotidine (PEPCID) tablet 20 mg  20 mg Oral QHS Jani Gravel, MD      . gabapentin (NEURONTIN) capsule 300 mg  300 mg Oral BID Jani Gravel, MD   300 mg at 05/25/19 1008  . hydrALAZINE (APRESOLINE) injection 10 mg  10 mg Intravenous Q6H PRN Jani Gravel, MD      . HYDROmorphone (DILAUDID) injection 1 mg  1 mg  Intravenous Q4H PRN Jani Gravel, MD      . lidocaine (LIDODERM) 5 % 3 patch  3 patch Transdermal Q24H Palumbo, April, MD   3 patch at 06/15/2019 2357  . linagliptin (TRADJENTA) tablet 5 mg  5 mg Oral Daily Jani Gravel, MD   5 mg at 05/25/19 1008  . loratadine (CLARITIN) tablet 10 mg  10 mg Oral Daily Jani Gravel, MD   10 mg at 05/25/19 1008  . magnesium oxide (MAG-OX) tablet 400 mg  400 mg Oral Daily Guilford Shi, MD   400 mg at 05/25/19 1008  . meclizine (ANTIVERT) tablet 25 mg  25 mg Oral Daily Jani Gravel, MD   25 mg at 05/25/19 1008  . mometasone-formoterol (DULERA) 200-5 MCG/ACT inhaler 2 puff  2 puff Inhalation BID Jani Gravel, MD   2 puff at 05/25/19 1019  . ondansetron (ZOFRAN) injection 4 mg  4 mg Intravenous Q6H PRN Jani Gravel, MD      . oxybutynin (DITROPAN) tablet 5 mg  5 mg Oral Daily PRN Jani Gravel, MD      . pantoprazole (PROTONIX) EC tablet 40 mg  40 mg Oral Daily Jani Gravel, MD   40 mg at 05/25/19 1008  . polyethylene glycol (MIRALAX / GLYCOLAX) packet 17 g  17 g Oral Daily Guilford Shi, MD   17 g at 05/25/19 1008  . polyvinyl alcohol (LIQUIFILM TEARS) 1.4 % ophthalmic solution 2 drop  2 drop Both Eyes PRN Guilford Shi, MD       Facility-Administered Medications Ordered in Other Encounters  Medication Dose Route Frequency Provider Last Rate Last Dose  . bupivacaine-epinephrine (MARCAINE W/ EPI) 0.5% -1:200000 injection    Anesthesia Intra-op Lorrene Reid, MD   25 mL at 10/07/15 1734     Allergies  Allergen Reactions  . Aspirin Swelling    Angioedema-must take EC coated form only  . Ibuprofen Swelling    Angioedema   . Penicillins Hives    Did it involve swelling of the face/tongue/throat, SOB, or low BP? No Did it involve sudden or severe rash/hives, skin peeling, or any reaction on the inside of  your mouth or nose? Yes Did you need to seek medical attention at a hospital or doctor's office? Yes When did it last happen?childhood  If all above answers are  "NO", may proceed with cephalosporin use.   . Sulfa Antibiotics Hives  :  Family History  Problem Relation Age of Onset  . Diabetes Mother   . Heart disease Father   . Diabetes Father   . Heart disease Sister   . Diabetes Sister   :  Social History   Socioeconomic History  . Marital status: Single    Spouse name: Not on file  . Number of children: Not on file  . Years of education: Not on file  . Highest education level: Not on file  Occupational History  . Occupation: Retired    Comment: Musician  . Financial resource strain: Not on file  . Food insecurity    Worry: Not on file    Inability: Not on file  . Transportation needs    Medical: Not on file    Non-medical: Not on file  Tobacco Use  . Smoking status: Former Smoker    Packs/day: 1.50    Years: 15.00    Pack years: 22.50    Types: Cigarettes    Quit date: 04/29/1987    Years since quitting: 32.0  . Smokeless tobacco: Never Used  Substance and Sexual Activity  . Alcohol use: Yes    Comment: Rare  . Drug use: No  . Sexual activity: Not on file  Lifestyle  . Physical activity    Days per week: Not on file    Minutes per session: Not on file  . Stress: Not on file  Relationships  . Social Herbalist on phone: Not on file    Gets together: Not on file    Attends religious service: Not on file    Active member of club or organization: Not on file    Attends meetings of clubs or organizations: Not on file    Relationship status: Not on file  . Intimate partner violence    Fear of current or ex partner: Not on file    Emotionally abused: Not on file    Physically abused: Not on file    Forced sexual activity: Not on file  Other Topics Concern  . Not on file  Social History Narrative  . Not on file  :  Review of Systems: A comprehensive 14 point review of systems was negative except as noted in the HPI.  Exam: Patient Vitals for the past 24 hrs:  BP Temp Temp src Pulse  Resp SpO2 Height Weight  05/25/19 0608 (!) 141/56 98.5 F (36.9 C) Oral 80 20 94 % - -  05/25/19 0340 - - - - - - 5\' 11"  (1.803 m) (!) 352 lb (159.7 kg)  05/25/19 0243 (!) 143/40 98.5 F (36.9 C) Oral 75 20 96 % - -  05/25/19 0200 118/66 - - 69 15 95 % - -  05/25/19 0130 (!) 111/44 - - 72 16 95 % - -  05/25/19 0105 (!) 126/56 - - 75 15 95 % - -  05/25/19 0030 (!) 145/49 - - 81 (!) 25 92 % - -  05/25/19 0000 (!) 117/93 - - 79 17 96 % - -  06/04/2019 2300 (!) 119/46 - - 70 17 97 % - -  05/26/2019 2228 - - - 79 (!) 24 97 % - -  06/05/2019  2224 (!) 133/52 - - 87 (!) 23 (!) 87 % - -  05/23/2019 2040 (!) 139/54 98.8 F (37.1 C) Oral 77 17 91 % 5\' 11"  (1.803 m) (!) 348 lb (157.9 kg)    General:  well-nourished in no acute distress.   Eyes:  no scleral icterus.   ENT:  There were no oropharyngeal lesions.   Neck was without thyromegaly.   Lymphatics:  Negative cervical, supraclavicular or axillary adenopathy.   Respiratory: The patient has some audible wheezing when talking with him however, lungs were clear bilaterally without wheezing or crackles.  Cardiovascular:  Regular rate and rhythm, S1/S2, without murmur, rub or gallop.  Pitting edema to the bilateral lower extremities. GI: Morbidly obese, abdomen is distended, positive bowel sounds, nontender Musculoskeletal: Strength symmetrical in the extremities.  Skin exam was without echymosis, petichae.   Neuro exam was nonfocal. Patient was alert and oriented.  Attention was good.   Language was appropriate.  Mood was normal without depression.  Speech was not pressured.  Thought content was not tangential.     Lab Results  Component Value Date   WBC 7.8 05/25/2019   HGB 12.0 (L) 05/25/2019   HCT 38.4 (L) 05/25/2019   PLT 153 05/25/2019   GLUCOSE 129 (H) 05/25/2019   ALT 19 05/25/2019   AST 32 05/25/2019   NA 131 (L) 05/25/2019   K 4.4 05/25/2019   CL 93 (L) 05/25/2019   CREATININE 2.19 (H) 05/25/2019   BUN 35 (H) 05/25/2019   CO2 30  05/25/2019    Ct Abdomen Pelvis W Contrast  Result Date: 05/01/2019 CLINICAL DATA:  2 week history of right upper quadrant pain. EXAM: CT ABDOMEN AND PELVIS WITH CONTRAST TECHNIQUE: Multidetector CT imaging of the abdomen and pelvis was performed using the standard protocol following bolus administration of intravenous contrast. CONTRAST:  168mL ISOVUE-300 IOPAMIDOL (ISOVUE-300) INJECTION 61% COMPARISON:  01/17/2018 FINDINGS: Lower chest: Tiny pulmonary nodules in the posterior right lung base measuring 6 mm on images 1 and 8 of series 3 are stable in the interval, likely benign. Atelectasis or scarring is noted in the lung bases bilaterally. Hepatobiliary: No suspicious focal abnormality within the liver parenchyma. Gallbladder surgically absent. No intrahepatic or extrahepatic biliary dilation. Pancreas: No focal mass lesion. No dilatation of the main duct. No intraparenchymal cyst. No peripancreatic edema. Spleen: No splenomegaly. No focal mass lesion. Adrenals/Urinary Tract: No adrenal nodule or mass. Right kidney unremarkable. Similar appearance of a 6.5 cm interpolar left renal cyst. Smaller low-density 14 mm interpolar left renal lesion is also stable. No evidence for hydronephrosis. No evidence for hydroureter. Bladder is decompressed. Stomach/Bowel: Stomach is unremarkable. No gastric wall thickening. No evidence of outlet obstruction. Duodenum is normally positioned as is the ligament of Treitz. No small bowel wall thickening. No small bowel dilatation. The terminal ileum is normal. The appendix is not visualized, but there is no edema or inflammation in the region of the cecum. No gross colonic mass. No colonic wall thickening. Vascular/Lymphatic: There is abdominal aortic atherosclerosis without aneurysm. Interval development of lymphadenopathy in the lower mediastinum and abdomen. 13 mm right retrocrural node seen on 12/02. 2.4 cm short axis portal caval node visible on 30/2. 2.3 cm short axis  aortocaval node visible on 38/2. 2.8 cm left para-aortic node identified on 50/2. Small lymph nodes are seen in the common iliac chains bilaterally. 15 mm short axis left common femoral node visible on 77/2. Reproductive: Prostate gland is enlarged. Other: No intraperitoneal free fluid. Musculoskeletal:  Right groin hernia contains only fat. Multiple new subtle lucent lesions are identified in the bony anatomy including a 13 mm lesion in the L1 vertebral body (32/2), 1.9 cm lesion in the L2 vertebral body (40/2), subtle cortical lesion in the right iliac crest (59/2) and 10 mm sacral lesion on 69/2. IMPRESSION: 1. Lower mediastinal and abdominal/pelvic lymphadenopathy. This could be related to lymphoma or metastatic disease, but given the interval development of multiple new lucent bone lesions, metastatic disease is favored. No evidence for a primary lesion on the current exam. 2.  Aortic Atherosclerois (ICD10-170.0) 3. Tiny pulmonary nodules in the right lung base are stable since prior study, suggesting benign etiology. These results will be called to the ordering clinician or representative by the Radiologist Assistant, and communication documented in the PACS or zVision Dashboard. Electronically Signed   By: Misty Stanley M.D.   On: 05/01/2019 17:26   Nm Pet Image Initial (pi) Skull Base To Thigh  Result Date: 05/04/2019 CLINICAL DATA:  Initial staging, abdominal lymphadenopathy treatment strategy for abdominal lymphadenopathy. EXAM: NUCLEAR MEDICINE PET SKULL BASE TO THIGH TECHNIQUE: Sixteen mCi F-18 FDG was injected intravenously. Full-ring PET imaging was performed from the skull base to thigh after the radiotracer. CT data was obtained and used for attenuation correction and anatomic localization. Fasting blood glucose: 113 mg/dl COMPARISON:  None. FINDINGS: Mediastinal blood pool activity: SUV max 1.8 Liver activity: SUV max NA NECK: No hypermetabolic lymph nodes in the neck. Incidental CT findings:  none CHEST: Cluster prevascular lymph nodes. Example lymph node measures 0.6 cm prevascular space with SUV max 10.7. Nodes extend to the high LEFT paratracheal nodal station but no clear supraclavicular hypermetabolic nodes. No hypermetabolic axillary nodes Incidental CT findings: Small RIGHT pleural effusion. Mild RIGHT basilar atelectasis. Nodule along the RIGHT pleural surface measures 8 mm (image 84/1 without metabolic activity. Second nodule along the posterior aspect of the LEFT upper lobe against the fissure measures 6 mm (image 57/4 also without metabolic activity. ABDOMEN/PELVIS: Cluster hypermetabolic lymph nodes in the retroperitoneum along the aorta from the renal veins to the iliac arteries. Example LEFT aorta SUV max equal 6.2 measures 2 cm (image 143/4. Posterior the aorta SUV max equal 7.6. Noted within the mesentery on the SMA adjacent to the uncinate of the pancreas measures 2.2 cm SUV max 10.6. Smaller hypermetabolic nodes extend along the common iliac arteries. No hypermetabolic external iliac nodes or inguinal nodes. Spleen is normal metabolic activity normal volume. No abnormal activity in liver. Incidental CT findings: There is a lesion in the SKELETON: Along the posterior aspect of the T7 vertebral body there is a lytic lesion measuring 2 cm (image 80/4) which has mild metabolic activity (SUV max equal 6.0.) This lesion is new from CT September 23, 3242) Focal metabolic activity in the posterior LEFT iliac bone with SUV max equal 6.9. No clear CT abnormality at this level. Lytic lesion in the L1 vertebral body with mild metabolic activity (SUV max equal 5.7. Incidental CT findings: none IMPRESSION: 1. Hypermetabolic mediastinal prevascular nodes and hypermetabolic retroperitoneal and mesenteric nodes consistent with lymphoma versus granulomatous disease (sarcoidosis). 2. Lytic lesion in the T7 vertebral body with moderate metabolic activity is concerning for skeletal metastasis versus sarcoid  lesion. 3. Small pulmonary nodules do not have associated metabolic activity. Recommend attention on follow-up. 4. Unfortunately, there are no large hypermetabolic supraclavicular or inguinal/iliac nodes for biopsy. The hypermetabolic T7 lesion is represented well on CT. The hypermetabolic LEFT iliac bone lesion is not well  depicted on CT. Electronically Signed   By: Suzy Bouchard M.D.   On: 05/04/2019 13:06   Ct Biopsy  Result Date: 05/11/2019 INDICATION: Retroperitoneal adenopathy, concern for lymphoproliferative disorder EXAM: CT-GUIDED BIOPSY LEFT RETROPERITONEAL BULKY ADENOPATHY MEDICATIONS: 1% lidocaine local ANESTHESIA/SEDATION: 0.5 mg IV Versed; 12.5 mcg IV Fentanyl Moderate Sedation Time:  13 minutes The patient was continuously monitored during the procedure by the interventional radiology nurse under my direct supervision. PROCEDURE: The procedure, risks, benefits, and alternatives were explained to the patient. Questions regarding the procedure were encouraged and answered. The patient understands and consents to the procedure. Previous imaging reviewed. Patient positioned prone. Noncontrast localization CT performed. The left bulky retroperitoneal periaortic adenopathy was localized and marked. Under sterile conditions and local anesthesia, a 17 gauge 16.8 cm guide was advanced to the adenopathy. Needle position confirmed with CT. 18 gauge core biopsies obtained. Samples placed in saline. Needle removed. Postprocedure imaging demonstrates no hemorrhage or hematoma. Patient tolerated the procedure well without complication. Vital sign monitoring by nursing staff during the procedure will continue as patient is in the special procedures unit for post procedure observation. FINDINGS: The images document guide needle placement within the left retroperitoneal periaortic adenopathy. Post biopsy images demonstrate no hemorrhage or hematoma. COMPLICATIONS: None immediate. IMPRESSION: Successful  CT-guided core biopsy of the left retroperitoneal periaortic adenopathy Electronically Signed   By: [name].  Shick M.D.   On: 05/11/2019 14:24   Ct Renal Stone Study  Result Date: 05/25/2019 CLINICAL DATA:  Low back pain, generalized abdominal pain EXAM: CT ABDOMEN AND PELVIS WITHOUT CONTRAST TECHNIQUE: Multidetector CT imaging of the abdomen and pelvis was performed following the standard protocol without IV contrast. COMPARISON:  08/31/2018 FINDINGS: Lower chest: small bilateral pleural effusions, new since prior study. Small nodules in the right lower lobe are stable. Bibasilar atelectasis. Heart is normal size. Mediastinal adenopathy partially imaged on the upper images as seen on prior study. Heart is normal size. Hepatobiliary: No focal liver abnormality is seen. Status post cholecystectomy. No biliary dilatation. Pancreas: No focal abnormality or ductal dilatation. Spleen: Spleen mildly enlarged with a craniocaudal length of 13.9 cm. This is stable when compared to prior study. No focal splenic abnormality. Adrenals/Urinary Tract: Left renal cyst measures 6 cm. No hydronephrosis. Adrenal glands and urinary bladder unremarkable. Stomach/Bowel: Appendix normal. Stomach, large and small bowel grossly unremarkable. Vascular/Lymphatic: Aortic atherosclerosis. No aneurysm. Extensive retroperitoneal adenopathy. This continues into the pelvis/iliac chains. This is also noted in the retrocrural region. This is unchanged since prior study. Reproductive: Mild prostate prominence. Other: No free fluid or free air. There is extensive stranding/soft tissue density noted in the upper retroperitoneum around the adrenal glands. This has progressed significantly since prior study and is of unknown etiology. Musculoskeletal: Lytic lesion within the T7 vertebral body is stable. Lytic lesion within the L2 vertebral body also stable. IMPRESSION: Extensive retroperitoneal adenopathy in the abdomen and pelvis. Adenopathy in the  visualized lower mediastinum. This is unchanged since prior study and could reflect lymphoma or metastatic disease. Extensive stranding in the retroperitoneum in the upper abdomen around the adrenal glands. This is of unknown etiology, progressed since prior study. Small bilateral pleural effusions, new since prior study. Stable small lower lobe pulmonary nodules. Bibasilar atelectasis. Splenomegaly, stable. Aortic atherosclerosis. Stable lytic lesions in the T7 and L2 vertebral bodies. Electronically Signed   By: Rolm Baptise M.D.   On: 05/25/2019 00:04     Ct Abdomen Pelvis W Contrast  Result Date: 05/01/2019 CLINICAL DATA:  2 week  history of right upper quadrant pain. EXAM: CT ABDOMEN AND PELVIS WITH CONTRAST TECHNIQUE: Multidetector CT imaging of the abdomen and pelvis was performed using the standard protocol following bolus administration of intravenous contrast. CONTRAST:  188mL ISOVUE-300 IOPAMIDOL (ISOVUE-300) INJECTION 61% COMPARISON:  01/17/2018 FINDINGS: Lower chest: Tiny pulmonary nodules in the posterior right lung base measuring 6 mm on images 1 and 8 of series 3 are stable in the interval, likely benign. Atelectasis or scarring is noted in the lung bases bilaterally. Hepatobiliary: No suspicious focal abnormality within the liver parenchyma. Gallbladder surgically absent. No intrahepatic or extrahepatic biliary dilation. Pancreas: No focal mass lesion. No dilatation of the main duct. No intraparenchymal cyst. No peripancreatic edema. Spleen: No splenomegaly. No focal mass lesion. Adrenals/Urinary Tract: No adrenal nodule or mass. Right kidney unremarkable. Similar appearance of a 6.5 cm interpolar left renal cyst. Smaller low-density 14 mm interpolar left renal lesion is also stable. No evidence for hydronephrosis. No evidence for hydroureter. Bladder is decompressed. Stomach/Bowel: Stomach is unremarkable. No gastric wall thickening. No evidence of outlet obstruction. Duodenum is normally  positioned as is the ligament of Treitz. No small bowel wall thickening. No small bowel dilatation. The terminal ileum is normal. The appendix is not visualized, but there is no edema or inflammation in the region of the cecum. No gross colonic mass. No colonic wall thickening. Vascular/Lymphatic: There is abdominal aortic atherosclerosis without aneurysm. Interval development of lymphadenopathy in the lower mediastinum and abdomen. 13 mm right retrocrural node seen on 12/02. 2.4 cm short axis portal caval node visible on 30/2. 2.3 cm short axis aortocaval node visible on 38/2. 2.8 cm left para-aortic node identified on 50/2. Small lymph nodes are seen in the common iliac chains bilaterally. 15 mm short axis left common femoral node visible on 77/2. Reproductive: Prostate gland is enlarged. Other: No intraperitoneal free fluid. Musculoskeletal: Right groin hernia contains only fat. Multiple new subtle lucent lesions are identified in the bony anatomy including a 13 mm lesion in the L1 vertebral body (32/2), 1.9 cm lesion in the L2 vertebral body (40/2), subtle cortical lesion in the right iliac crest (59/2) and 10 mm sacral lesion on 69/2. IMPRESSION: 1. Lower mediastinal and abdominal/pelvic lymphadenopathy. This could be related to lymphoma or metastatic disease, but given the interval development of multiple new lucent bone lesions, metastatic disease is favored. No evidence for a primary lesion on the current exam. 2.  Aortic Atherosclerois (ICD10-170.0) 3. Tiny pulmonary nodules in the right lung base are stable since prior study, suggesting benign etiology. These results will be called to the ordering clinician or representative by the Radiologist Assistant, and communication documented in the PACS or zVision Dashboard. Electronically Signed   By: Misty Stanley M.D.   On: 05/01/2019 17:26   Nm Pet Image Initial (pi) Skull Base To Thigh  Result Date: 05/04/2019 CLINICAL DATA:  Initial staging, abdominal  lymphadenopathy treatment strategy for abdominal lymphadenopathy. EXAM: NUCLEAR MEDICINE PET SKULL BASE TO THIGH TECHNIQUE: Sixteen mCi F-18 FDG was injected intravenously. Full-ring PET imaging was performed from the skull base to thigh after the radiotracer. CT data was obtained and used for attenuation correction and anatomic localization. Fasting blood glucose: 113 mg/dl COMPARISON:  None. FINDINGS: Mediastinal blood pool activity: SUV max 1.8 Liver activity: SUV max NA NECK: No hypermetabolic lymph nodes in the neck. Incidental CT findings: none CHEST: Cluster prevascular lymph nodes. Example lymph node measures 0.6 cm prevascular space with SUV max 10.7. Nodes extend to the high LEFT paratracheal  nodal station but no clear supraclavicular hypermetabolic nodes. No hypermetabolic axillary nodes Incidental CT findings: Small RIGHT pleural effusion. Mild RIGHT basilar atelectasis. Nodule along the RIGHT pleural surface measures 8 mm (image 36/6 without metabolic activity. Second nodule along the posterior aspect of the LEFT upper lobe against the fissure measures 6 mm (image 57/4 also without metabolic activity. ABDOMEN/PELVIS: Cluster hypermetabolic lymph nodes in the retroperitoneum along the aorta from the renal veins to the iliac arteries. Example LEFT aorta SUV max equal 6.2 measures 2 cm (image 143/4. Posterior the aorta SUV max equal 7.6. Noted within the mesentery on the SMA adjacent to the uncinate of the pancreas measures 2.2 cm SUV max 10.6. Smaller hypermetabolic nodes extend along the common iliac arteries. No hypermetabolic external iliac nodes or inguinal nodes. Spleen is normal metabolic activity normal volume. No abnormal activity in liver. Incidental CT findings: There is a lesion in the SKELETON: Along the posterior aspect of the T7 vertebral body there is a lytic lesion measuring 2 cm (image 80/4) which has mild metabolic activity (SUV max equal 6.0.) This lesion is new from CT September 22, 4401)  Focal metabolic activity in the posterior LEFT iliac bone with SUV max equal 6.9. No clear CT abnormality at this level. Lytic lesion in the L1 vertebral body with mild metabolic activity (SUV max equal 5.7. Incidental CT findings: none IMPRESSION: 1. Hypermetabolic mediastinal prevascular nodes and hypermetabolic retroperitoneal and mesenteric nodes consistent with lymphoma versus granulomatous disease (sarcoidosis). 2. Lytic lesion in the T7 vertebral body with moderate metabolic activity is concerning for skeletal metastasis versus sarcoid lesion. 3. Small pulmonary nodules do not have associated metabolic activity. Recommend attention on follow-up. 4. Unfortunately, there are no large hypermetabolic supraclavicular or inguinal/iliac nodes for biopsy. The hypermetabolic T7 lesion is represented well on CT. The hypermetabolic LEFT iliac bone lesion is not well depicted on CT. Electronically Signed   By: Suzy Bouchard M.D.   On: 05/04/2019 13:06   Ct Biopsy  Result Date: 05/11/2019 INDICATION: Retroperitoneal adenopathy, concern for lymphoproliferative disorder EXAM: CT-GUIDED BIOPSY LEFT RETROPERITONEAL BULKY ADENOPATHY MEDICATIONS: 1% lidocaine local ANESTHESIA/SEDATION: 0.5 mg IV Versed; 12.5 mcg IV Fentanyl Moderate Sedation Time:  13 minutes The patient was continuously monitored during the procedure by the interventional radiology nurse under my direct supervision. PROCEDURE: The procedure, risks, benefits, and alternatives were explained to the patient. Questions regarding the procedure were encouraged and answered. The patient understands and consents to the procedure. Previous imaging reviewed. Patient positioned prone. Noncontrast localization CT performed. The left bulky retroperitoneal periaortic adenopathy was localized and marked. Under sterile conditions and local anesthesia, a 17 gauge 16.8 cm guide was advanced to the adenopathy. Needle position confirmed with CT. 18 gauge core biopsies  obtained. Samples placed in saline. Needle removed. Postprocedure imaging demonstrates no hemorrhage or hematoma. Patient tolerated the procedure well without complication. Vital sign monitoring by nursing staff during the procedure will continue as patient is in the special procedures unit for post procedure observation. FINDINGS: The images document guide needle placement within the left retroperitoneal periaortic adenopathy. Post biopsy images demonstrate no hemorrhage or hematoma. COMPLICATIONS: None immediate. IMPRESSION: Successful CT-guided core biopsy of the left retroperitoneal periaortic adenopathy Electronically Signed   By: [name].  Shick M.D.   On: 05/11/2019 14:24   Ct Renal Stone Study  Result Date: 05/25/2019 CLINICAL DATA:  Low back pain, generalized abdominal pain EXAM: CT ABDOMEN AND PELVIS WITHOUT CONTRAST TECHNIQUE: Multidetector CT imaging of the abdomen and pelvis was performed following  the standard protocol without IV contrast. COMPARISON:  08/31/2018 FINDINGS: Lower chest: small bilateral pleural effusions, new since prior study. Small nodules in the right lower lobe are stable. Bibasilar atelectasis. Heart is normal size. Mediastinal adenopathy partially imaged on the upper images as seen on prior study. Heart is normal size. Hepatobiliary: No focal liver abnormality is seen. Status post cholecystectomy. No biliary dilatation. Pancreas: No focal abnormality or ductal dilatation. Spleen: Spleen mildly enlarged with a craniocaudal length of 13.9 cm. This is stable when compared to prior study. No focal splenic abnormality. Adrenals/Urinary Tract: Left renal cyst measures 6 cm. No hydronephrosis. Adrenal glands and urinary bladder unremarkable. Stomach/Bowel: Appendix normal. Stomach, large and small bowel grossly unremarkable. Vascular/Lymphatic: Aortic atherosclerosis. No aneurysm. Extensive retroperitoneal adenopathy. This continues into the pelvis/iliac chains. This is also noted in the  retrocrural region. This is unchanged since prior study. Reproductive: Mild prostate prominence. Other: No free fluid or free air. There is extensive stranding/soft tissue density noted in the upper retroperitoneum around the adrenal glands. This has progressed significantly since prior study and is of unknown etiology. Musculoskeletal: Lytic lesion within the T7 vertebral body is stable. Lytic lesion within the L2 vertebral body also stable. IMPRESSION: Extensive retroperitoneal adenopathy in the abdomen and pelvis. Adenopathy in the visualized lower mediastinum. This is unchanged since prior study and could reflect lymphoma or metastatic disease. Extensive stranding in the retroperitoneum in the upper abdomen around the adrenal glands. This is of unknown etiology, progressed since prior study. Small bilateral pleural effusions, new since prior study. Stable small lower lobe pulmonary nodules. Bibasilar atelectasis. Splenomegaly, stable. Aortic atherosclerosis. Stable lytic lesions in the T7 and L2 vertebral bodies. Electronically Signed   By: Rolm Baptise M.D.   On: 05/25/2019 00:04    Pathology:  SURGICAL PATHOLOGY  CASE: [id number]  PATIENT: Melissa Noon  Surgical Pathology Report   Clinical History: concern for lymphoma (cm)   FINAL MICROSCOPIC DIAGNOSIS:   A. LYMPH NODE, LEFT RETROPERITONEAL, NEEDLE CORE BIOPSY:  - Poorly differentiated adenocarcinoma. See comment  - Lymphoid tissue is not identified    COMMENT:   Immunohistochemical stains show following pattern of staining   Positive: Pancytokeratin (AE1/AE3), CK20 (rare cells - possibly  nonspecific)  Negative: CK7, CK5/6, TTF-1, CDX2, CD45, PSA, prostein, GATA3,  calretinin and WT1   Mucicarmine special stain is positive. This staining profile is  nonspecific and consistent with above interpretation. Dr. Saralyn Pilar and  Dr. Melina Copa have reviewed the case and concur with the above diagnosis.  Dr. Kenton Kingfisher was notified on  05/17/2019.   Assessment and Plan:   This is a 73 year old male with  1.  Metastatic poorly differentiated adenocarcinoma.  Immunohistochemical staining is nonspecific.  He has extensive retroperitoneal and mesenteric lymphadenopathy and lytic lesions at T7 and L2.  PET scan also showed a hypermetabolic left iliac bone lesion which was not present on the CT scan.  CT of the abdomen and pelvis with contrast on 05/01/2019 - 1. Lower mediastinal and abdominal/pelvic lymphadenopathy. This could be related to lymphoma or metastatic disease, but given the interval development of multiple new lucent bone lesions, metastatic disease is favored. No evidence for a primary lesion on the current exam. 2.  Aortic Atherosclerois (ICD10-170.0) 3. Tiny pulmonary nodules in the right lung base are stable since prior study, suggesting benign etiology.  PET scan on 05/04/2019 - 1. Hypermetabolic mediastinal prevascular nodes and hypermetabolic retroperitoneal and mesenteric nodes consistent with lymphoma versus granulomatous disease (sarcoidosis). 2. Lytic lesion in the T7  vertebral body with moderate metabolic activity is concerning for skeletal metastasis versus sarcoid lesion. 3. Small pulmonary nodules do not have associated metabolic activity. Recommend attention on follow-up. 4. Unfortunately, there are no large hypermetabolic supraclavicular or inguinal/iliac nodes for biopsy. The hypermetabolic T7 lesion is represented well on CT. The hypermetabolic LEFT iliac bone lesion is not well depicted on CT.  CT-guided biopsy of the left retroperitoneal adenopathy on 05/11/2019 -poorly differentiated adenocarcinoma, nonspecific immunohistochemical staining  CT renal stone study on 06/19/2019 - Extensive retroperitoneal adenopathy in the abdomen and pelvis. Adenopathy in the visualized lower mediastinum. This is unchanged since prior study and could reflect lymphoma or metastatic disease. Extensive  stranding in the retroperitoneum in the upper abdomen around the adrenal glands. This is of unknown etiology, progressed since prior study. Small bilateral pleural effusions, new since prior study. Stable small lower lobe pulmonary nodules. Bibasilar atelectasis. Splenomegaly, stable. Aortic atherosclerosis. Stable lytic lesions in the T7 and L2 vertebral bodies.  Labs from 05/25/2019 -LDH 209, PSA 2.61.  CEA pending.  -Imaging and biopsy results have been reviewed and discussed with the patient.  Biopsy consistent with metastatic poorly differentiated adenocarcinoma.  Primary site is unclear at this time.  Will await tumor markers. -Once the primary site of the cancer has been determined, will discuss details of systemic treatment with the patient.  2.  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  Acute on chronic CKD  -IV fluids per hospitalist. -Diuretics and ARB are currently on hold -Avoid nephrotoxic medications -Monitor renal function closely  4.  Diabetes with peripheral neuropathy   -On Tradjenta -CBGs are being monitored before meals and at bedtime with sliding scale insulin ordered  5.  CAD  -Currently on aspirin, Lipitor, WelChol, and diltiazem  6.  Hypertension  -Diuretics and ARB are currently on hold  7.  COPD  -On Symbicort and albuterol  8.  BPH  -On Cardura  Thank you for this referral.   Mikey Bussing, DNP, AGPCNP-BC, AOCNP   ADDENDUM  .Patient was Personally and independently interviewed, examined and relevant elements of the history of present illness were reviewed in details and an assessment and plan was created. All elements of the patient's history of present illness , assessment and plan were discussed in details with Mikey Bussing, DNP, AGPCNP-BC, AOCNP. The above documentation  reflects our combined findings assessment and plan.  73 yo male with extensive medical co-morbidities with ECOG PS of 2-3 with newly diagnosed  1) Metastatic poorly differentiated adenocarcinoma with unknown primary.  Discussed with pathology -- no definitive clues on morphology or IHC regarding site of primary tumor. Tumor is mucicarmine + --confirming adenocarcinoma.   PET/CT- no evident primary tumor. Has extensive disease and skeletal metastases. Patient with no overt focal symptoms to suggest a clear primary site of his tumor.  Most likely primary sites  GI tract vs urinary system   CEA WNL LDH - borderline +ve  2) Bone metastases Along the posterior aspect of the T7 vertebral body there is a lytic lesion measuring 2 cm (image 80/4) which has mild metabolic activity (SUV max equal 6.0.) This lesion is new from CT September 22, 4740)  Focal metabolic activity in the posterior LEFT iliac bone with SUV max equal 6.9. No clear CT abnormality at this level. Lytic lesion in the L1 vertebral body with mild metabolic activity (  SUV max equal 5.7.  PLAN -I had a detailed discussion with patient regarding the available imaging and pathology results. -we discussed the diagnosis of metastatic poorly differentiated adenocarcinoma with unknown primary. -we discussed that this is an incurable condition. -ordered AFP and bHCG tumor marker to r/o other primary sites -would recommend urine cytology to r/o TCC - if abnormal might need cystoscopy (based on goals of care) -options for treatment are all palliative and would take into account significant limitation from his performance status. -options broadly would be based on goals of cares -- Symptom based approach/best support cares through hospice vs palliative chemotherapy +/- RT -we discussed that with his performance status and extensive medical problems limiting his overall health palliative chemotherapy may not be tolerated very well. -he  would like to think further regarding these treatment approaches -would recommend palliative care consultation to help with symptom management and continued goals of care discussion. -pain mx will be potentially challenging outside of [facility] approach given high risk of respiratory decmpensation from his sleep apnea and O2 dependent COPD. -if patient is very keen to pursue palliative cancer directed treatment options would need to consider additional workup- cytology, potential tissue or origin analysis +/- foundation one testing and consideration of empiric chemotherapy regimen carbo/taxol vs FOLFOX -back pain mx per hospitalist-- multifactorial from DDD + ? From iliac and spinal mets. If pain uncontrolled might need to consider rad onc input for palliative RT. Based on goals of care -- might need to consider bone directed treatments for bone mets. -will f/u on Monday -all of the patients questions were answered to his apparent satisfaction.  Sullivan Lone MD MS

## 2019-05-25 NOTE — Progress Notes (Signed)
73 year old male with history of hypertension, chronic back pain/neuropathy, BPH, CKD stage III, COPD, CAD, diabetes mellitus, GERD, obstructive sleep apnea on CPAP evaluated by PCP recently on 11/2 for worsening left-sided back pain for over a week and prescribed tramadol.  He presented to the ED today with complaints of persistent back pain radiating to left anterior abdomen and stated tramadol not helping.  He also reports worsening lower extremity edema.  Patient seen GI recently as outpatient for subdiaphragmatic pain-bloating and dietary changes were advised.  Last colonoscopy was 3 years back.  It appears that patient underwent CT-guided retroperitoneal lymph node biopsy by IR on 10/22 and postprocedure complained of pain radiating to his groin-NSAIDs were recommended.  Pathology results indicate undifferentiated adenocarcinoma.  Patient states he has oncology appointment coming up. ED course:T 98.8, P 77 R 17, Bp 139/54, pox 91% on RA.Wbc 7.8, Hgb 12.0, Plt 153 Na 131, K 4.4, Bun 30, Creatinine 2.18.Ast 32, Alt 19, Alk phos 114, T. Bili 1.1 CT abdomen/pelvis revealed extensive retroperitoneal adenopathy in the abdomen and pelvis. Adenopathy in the visualized lower mediastinum. This is unchanged since prior study.  Extensive stranding in the retroperitoneum in the upper abdomen around the adrenal glands. This is of unknown etiology, progressed since prior study.  New small bilateral pleural effusions.  Splenomegaly, stable lytic lesions in T7/L2 vertebral bodies. Patient admitted for back pain symptom management with Dilaudid 1 mg IV every 4 as needed, IV fluids for AKI and with plan for oncology evaluation as inpatient.  On my assessment: Patient noted to be on 3 L nasal cannula O2 (uses CPAP and 2 L O2 at night at baseline) and noted to have significant lower extremity swellings 3+, pitting (states have worsened over the last 3 weeks).  He states pain currently controlled on IV Dilaudid.  He was  apparently advised to take 1-2 tabs of oxycodone as needed but 1 tab was not enough and 2 tabs were making him too drowsy.  He states he hurts more when he moves.  He is currently sitting in a chair comfortably and states does not hurt if he does not move. Patient reports following pulmonary as outpatient for COPD.  He apparently has been on Lasix for 2 to 3 years for chronic leg swellings with instructions to take extra diuretic as needed for worsening leg swellings.  He however states they have never been as bad as they are now. Plan: Discussed with oncology who will evaluate patient today. IV diuresis while watching renal function closely. Continue current pain medications.

## 2019-05-26 ENCOUNTER — Ambulatory Visit
Admission: RE | Admit: 2019-05-26 | Discharge: 2019-05-26 | Disposition: A | Discharge status: Home / Self Care | Payer: Medicare Other | Source: Ambulatory Visit | Attending: Radiation Oncology | Admitting: Radiation Oncology

## 2019-05-26 ENCOUNTER — Ambulatory Visit
Admit: 2019-05-26 | Discharge: 2019-05-26 | Disposition: A | Discharge status: Home / Self Care | Payer: Medicare Other | Attending: Radiation Oncology | Admitting: Radiation Oncology

## 2019-05-26 ENCOUNTER — Inpatient Hospital Stay (HOSPITAL_COMMUNITY): Payer: Medicare Other

## 2019-05-26 DIAGNOSIS — C7951 Secondary malignant neoplasm of bone: Secondary | ICD-10-CM

## 2019-05-26 DIAGNOSIS — I5033 Acute on chronic diastolic (congestive) heart failure: Secondary | ICD-10-CM

## 2019-05-26 DIAGNOSIS — Z7189 Other specified counseling: Secondary | ICD-10-CM

## 2019-05-26 DIAGNOSIS — J9621 Acute and chronic respiratory failure with hypoxia: Secondary | ICD-10-CM

## 2019-05-26 DIAGNOSIS — I5031 Acute diastolic (congestive) heart failure: Secondary | ICD-10-CM

## 2019-05-26 DIAGNOSIS — C799 Secondary malignant neoplasm of unspecified site: Secondary | ICD-10-CM

## 2019-05-26 LAB — BASIC METABOLIC PANEL
Anion gap: 10 (ref 5–15)
BUN: 29 mg/dL — ABNORMAL HIGH (ref 8–23)
CO2: 27 mmol/L (ref 22–32)
Calcium: 8.7 mg/dL — ABNORMAL LOW (ref 8.9–10.3)
Chloride: 93 mmol/L — ABNORMAL LOW (ref 98–111)
Creatinine, Ser: 1.49 mg/dL — ABNORMAL HIGH (ref 0.61–1.24)
GFR calc Af Amer: 53 mL/min — ABNORMAL LOW (ref >60)
GFR calc non Af Amer: 46 mL/min — ABNORMAL LOW (ref >60)
Glucose, Bld: 122 mg/dL — ABNORMAL HIGH (ref 70–99)
Potassium: 4.2 mmol/L (ref 3.5–5.1)
Sodium: 130 mmol/L — ABNORMAL LOW (ref 135–145)

## 2019-05-26 LAB — HEMOGLOBIN A1C
Hgb A1c MFr Bld: 5.4 % (ref 4.8–5.6)
Mean Plasma Glucose: 108.28 mg/dL

## 2019-05-26 LAB — CBC
HCT: 37.8 % — ABNORMAL LOW (ref 39.0–52.0)
Hemoglobin: 11.6 g/dL — ABNORMAL LOW (ref 13.0–17.0)
MCH: 28.2 pg (ref 26.0–34.0)
MCHC: 30.7 g/dL (ref 30.0–36.0)
MCV: 91.7 fL (ref 80.0–100.0)
Platelets: 153 10*3/uL (ref 150–400)
RBC: 4.12 MIL/uL — ABNORMAL LOW (ref 4.22–5.81)
RDW: 16.7 % — ABNORMAL HIGH (ref 11.5–15.5)
WBC: 6.2 10*3/uL (ref 4.0–10.5)
nRBC: 0 % (ref 0.0–0.2)

## 2019-05-26 LAB — ECHOCARDIOGRAM COMPLETE
Height: 71 in
Weight: 5571.2 oz

## 2019-05-26 LAB — GLUCOSE, CAPILLARY
Glucose-Capillary: 137 mg/dL — ABNORMAL HIGH (ref 70–99)
Glucose-Capillary: 139 mg/dL — ABNORMAL HIGH (ref 70–99)
Glucose-Capillary: 164 mg/dL — ABNORMAL HIGH (ref 70–99)

## 2019-05-26 LAB — BRAIN NATRIURETIC PEPTIDE: B Natriuretic Peptide: 13.7 pg/mL (ref 0.0–100.0)

## 2019-05-26 LAB — CYTOLOGY - NON PAP

## 2019-05-26 LAB — CEA: CEA: 1.9 ng/mL (ref 0.0–4.7)

## 2019-05-26 MED ORDER — INSULIN ASPART 100 UNIT/ML ~~LOC~~ SOLN
0.0000 [IU] | Freq: Three times a day (TID) | SUBCUTANEOUS | Status: DC
  Administered 2019-05-26 – 2019-05-27 (×3): 2 [IU] via SUBCUTANEOUS
  Administered 2019-05-27: 3 [IU] via SUBCUTANEOUS
  Administered 2019-05-27 – 2019-05-28 (×3): 2 [IU] via SUBCUTANEOUS
  Administered 2019-05-28: 3 [IU] via SUBCUTANEOUS
  Administered 2019-05-29 (×3): 2 [IU] via SUBCUTANEOUS
  Administered 2019-05-30: 3 [IU] via SUBCUTANEOUS
  Administered 2019-05-30 – 2019-05-31 (×2): 2 [IU] via SUBCUTANEOUS
  Administered 2019-05-31 (×2): 3 [IU] via SUBCUTANEOUS
  Administered 2019-06-01: 13:00:00 1 [IU] via SUBCUTANEOUS
  Administered 2019-06-01: 17:00:00 5 [IU] via SUBCUTANEOUS
  Administered 2019-06-02 – 2019-06-03 (×3): 3 [IU] via SUBCUTANEOUS
  Administered 2019-06-03 – 2019-06-04 (×2): 2 [IU] via SUBCUTANEOUS
  Administered 2019-06-04: 17:00:00 3 [IU] via SUBCUTANEOUS
  Administered 2019-06-05 (×2): 2 [IU] via SUBCUTANEOUS

## 2019-05-26 MED ORDER — INSULIN ASPART 100 UNIT/ML ~~LOC~~ SOLN
0.0000 [IU] | Freq: Every day | SUBCUTANEOUS | Status: DC
  Administered 2019-06-02: 2 [IU] via SUBCUTANEOUS

## 2019-05-26 NOTE — Progress Notes (Signed)
  Echocardiogram 2D Echocardiogram has been performed.  Jennette Dubin 05/26/2019, 3:22 PM

## 2019-05-26 NOTE — Consult Note (Signed)
Radiation Oncology         [phone] ________________________________  Initial inpatient Consultation  Name: Shane Watson MRN: [id number]  Date of Service: 06/12/2019 DOB: [birth year]  [id number], [name], MD  No ref. provider found   REFERRING PHYSICIAN: Dr. Earnest Conroy DIAGNOSIS: 73 y.o. 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    ICD-10-CM   1. Metastatic malignant neoplasm, unspecified site [facility])  C79.9     HISTORY OF PRESENT ILLNESS: Shane Watson is a 72 y.o. male seen at the request of Dr. Earnest Conroy.  He was recently admitted to the hospital on 05/25/2019 with a 6-week history of progressive low back pain which was poorly managed despite narcotic pain medication.  He was evaluated with his PCP and referred for CT A/P which was performed on 05/01/2019 and showed lower mediastinal and abdominopelvic lymphadenopathy felt likely to be related to lymphoma or metastatic disease.  Given the multiple lytic bone lesions, metastatic disease was favored but there was no evidence of a primary lesion on this study.  He had further evaluation with a PET scan on 05/04/2019 which confirmed hypermetabolic mediastinal prevascular nodes and hypermetabolic retroperitoneal mesenteric nodes consistent with lymphoma versus granulomatous disease (sarcoidosis).  There was a 2 cm lytic lesion in the T7 vertebral body with moderate metabolic activity concerning for skeletal metastasis versus sarcoid lesion as well as a lytic lesion in the L1 vertebral body and focal metabolic activity in the posterior left iliac bone.  He had a CT-guided biopsy of the left retroperitoneal lymph node on 05/11/2019 with final pathology revealing poorly differentiated adenocarcinoma.  Unfortunately, immunohistochemical chemical staining was inconclusive/nonspecific.  He was referred to the cancer center and scheduled for outpatient  consult visit on 05/30/2019 but unfortunately his pain became so severe that he required admission.  A CT renal stone protocol study was performed on admission and demonstrated an unchanged appearance of the diffuse retroperitoneal and mediastinal adenopathy and stable lytic lesions at T7 and L2 vertebral bodies. PSA was normal at 2.61.  A CEA, SPEP, and UPEP are currently pending.  We are asked to consult today for consideration of palliative radiotherapy to his painful metastatic disease in the spine.  PREVIOUS RADIATION THERAPY: No  PAST MEDICAL HISTORY:  Past Medical History:  Diagnosis Date   Allergy    Anemia    Arthritis    BPH (benign prostatic hyperplasia)    CKD (chronic kidney disease), stage III    Complication of anesthesia    COPD (chronic obstructive pulmonary disease) ([place])    a. Uses Home O2 w/ exertion.   Coronary artery disease    a. 11/2010 Cath: LM nl, LAD 60m (3.5x22 and 3.5x12 Resolute DES'), LCX nl, OM1/2/3 nl, RCA 20-30p, PDA nl.    Diabetes mellitus without complication (HCC)    GERD (gastroesophageal reflux disease)    H/O echocardiogram    a. per notes in 11/2010 - nl EF, basal inf wma.   Heart murmur    History of kidney stones    Hyperlipidemia    Hypertensive heart disease    Hyperthyroidism    Neuropathy    Neuropathy    OSA (obstructive sleep apnea)    a. Uses CPAP.   PONV (postoperative nausea and vomiting)    Vertigo       PAST SURGICAL HISTORY: Past Surgical History:  Procedure Laterality Date   2 heart stents  12/18/10   CARDIAC  CATHETERIZATION     CHOLECYSTECTOMY     COLONOSCOPY     REVERSE SHOULDER ARTHROPLASTY Right 10/08/2015   REVERSE SHOULDER ARTHROPLASTY Right 10/08/2015   Procedure: REVERSE SHOULDER ARTHROPLASTY;  Surgeon: Netta Cedars, MD;  Location: [place];  Service: Orthopedics;  Laterality: Right;   right knee surgery Right 2008ish   "scraped it out"    FAMILY HISTORY:  Family History  Problem  Relation Age of Onset   Diabetes Mother    Heart disease Father    Diabetes Father    Heart disease Sister    Diabetes Sister     SOCIAL HISTORY:  Social History   Socioeconomic History   Marital status: Single    Spouse name: Not on file   Number of children: Not on file   Years of education: Not on file   Highest education level: Not on file  Occupational History   Occupation: Retired    Comment: Merchandiser, retail strain: Not on file   Food insecurity    Worry: Not on file    Inability: Not on file   Transportation needs    Medical: Not on file    Non-medical: Not on file  Tobacco Use   Smoking status: Former Smoker    Packs/day: 1.50    Years: 15.00    Pack years: 22.50    Types: Cigarettes    Quit date: 04/29/1987    Years since quitting: 32.0   Smokeless tobacco: Never Used  Substance and Sexual Activity   Alcohol use: Yes    Comment: Rare   Drug use: No   Sexual activity: Not on file  Lifestyle   Physical activity    Days per week: Not on file    Minutes per session: Not on file   Stress: Not on file  Relationships   Social connections    Talks on phone: Not on file    Gets together: Not on file    Attends religious service: Not on file    Active member of club or organization: Not on file    Attends meetings of clubs or organizations: Not on file    Relationship status: Not on file   Intimate partner violence    Fear of current or ex partner: Not on file    Emotionally abused: Not on file    Physically abused: Not on file    Forced sexual activity: Not on file  Other Topics Concern   Not on file  Social History Narrative   Not on file    ALLERGIES: Aspirin, Ibuprofen, Penicillins, and Sulfa antibiotics  MEDICATIONS:  Current Facility-Administered Medications  Medication Dose Route Frequency Provider Last Rate Last Dose   acetaminophen (TYLENOL) tablet 650 mg  650 mg Oral Q6H PRN Jani Gravel, MD       Or   acetaminophen (TYLENOL) suppository 650 mg  650 mg Rectal Q6H PRN Jani Gravel, MD       albuterol (PROVENTIL) (2.5 MG/3ML) 0.083% nebulizer solution 3 mL  3 mL Inhalation Q4H PRN Jani Gravel, MD       aspirin EC tablet 81 mg  81 mg Oral Daily Jani Gravel, MD   81 mg at 05/26/19 0932   atorvastatin (LIPITOR) tablet 40 mg  40 mg Oral Daily Jani Gravel, MD   40 mg at 05/26/19 0933   [facility]) tablet 625 mg  625 mg Oral BID WC Jani Gravel, MD   625 mg at 05/26/19  4259   diltiazem (CARDIZEM SR) 12 hr capsule 90 mg  90 mg Oral Daily Jani Gravel, MD   90 mg at 05/26/19 0931   diltiazem (CARDIZEM) tablet 60 mg  60 mg Oral QID PRN Jani Gravel, MD       diphenoxylate-atropine (LOMOTIL) 2.5-0.025 MG per tablet 1 tablet  1 tablet Oral QID PRN Jani Gravel, MD       doxazosin (CARDURA) tablet 8 mg  8 mg Oral QHS Jani Gravel, MD   8 mg at 05/25/19 2013   enoxaparin (LOVENOX) injection 60 mg  60 mg Subcutaneous Q24H Jani Gravel, MD   60 mg at 05/26/19 0933   famotidine (PEPCID) tablet 20 mg  20 mg Oral Loma Sousa, MD   20 mg at 05/25/19 2014   furosemide (LASIX) injection 20 mg  20 mg Intravenous Daily Guilford Shi, MD   20 mg at 05/26/19 0932   gabapentin (NEURONTIN) capsule 300 mg  300 mg Oral BID Jani Gravel, MD   300 mg at 05/26/19 0932   hydrALAZINE (APRESOLINE) injection 10 mg  10 mg Intravenous Q6H PRN Jani Gravel, MD       HYDROmorphone (DILAUDID) injection 1 mg  1 mg Intravenous Q4H PRN Jani Gravel, MD   1 mg at 05/26/19 1508   insulin aspart (novoLOG) injection 0-15 Units  0-15 Units Subcutaneous TID WC Guilford Shi, MD   2 Units at 05/26/19 1232   insulin aspart (novoLOG) injection 0-5 Units  0-5 Units Subcutaneous QHS Guilford Shi, MD       lidocaine (LIDODERM) 5 % 3 patch  3 patch Transdermal Q24H Palumbo, April, MD   3 patch at 05/26/19 0007   linagliptin (TRADJENTA) tablet 5 mg  5 mg Oral Daily Jani Gravel, MD   5 mg at 05/26/19 0933    loratadine (CLARITIN) tablet 10 mg  10 mg Oral Daily Jani Gravel, MD   10 mg at 05/26/19 0933   magnesium oxide (MAG-OX) tablet 400 mg  400 mg Oral Daily Guilford Shi, MD   400 mg at 05/26/19 0933   meclizine (ANTIVERT) tablet 25 mg  25 mg Oral Daily Jani Gravel, MD   25 mg at 05/26/19 0932   mometasone-formoterol (DULERA) 200-5 MCG/ACT inhaler 2 puff  2 puff Inhalation BID Jani Gravel, MD   2 puff at 05/26/19 0759   ondansetron (ZOFRAN) injection 4 mg  4 mg Intravenous Q6H PRN Jani Gravel, MD       oxybutynin (DITROPAN) tablet 5 mg  5 mg Oral Daily PRN Jani Gravel, MD       pantoprazole (PROTONIX) EC tablet 40 mg  40 mg Oral Daily Jani Gravel, MD   40 mg at 05/26/19 0933   polyethylene glycol (MIRALAX / GLYCOLAX) packet 17 g  17 g Oral Daily Guilford Shi, MD   17 g at 05/26/19 5638   polyvinyl alcohol (LIQUIFILM TEARS) 1.4 % ophthalmic solution 2 drop  2 drop Both Eyes PRN Guilford Shi, MD       Facility-Administered Medications Ordered in Other Encounters  Medication Dose Route Frequency Provider Last Rate Last Dose   bupivacaine-epinephrine (MARCAINE W/ EPI) 0.5% -1:200000 injection    Anesthesia Intra-op Lorrene Reid, MD   25 mL at 10/07/15 1734    REVIEW OF SYSTEMS:  On review of systems, the patient reports that he is doing fair overall.  His pain has significantly improved since admission.  He currently denies any chest pain, increased shortness of breath, productive cough, hemoptysis,  fevers, chills, or night sweats.  He reports chronic shortness of breath on exertion which is unchanged recently.  He reports a decreased appetite but notes that he has actually gained weight.  He does report chronic lower extremity edema which has been worse over the past month despite taking his Lasix as prescribed.  He also reports feeling tightness/swelling in his abdomen.  He denies abdominal pain, nausea or vomiting but has had some intermittent constipation since starting the narcotic  pain medication.  He denies any new musculoskeletal or joint aches or pains aside from that mentioned in the HPI related to his back pain.  He reports that the back pain does not radiate into the upper or lower extremities.  He has noted intermittent paresthesias along the right anterior chest wall as well as in the left upper thigh that come and go and are most noticeable after he has been sitting for any period of time.  A complete review of systems is obtained and is otherwise negative.  PHYSICAL EXAM:  Wt Readings from Last 3 Encounters:  05/26/19 (!) 348 lb 3.2 oz (157.9 kg)  05/15/19 (!) 348 lb (157.9 kg)  05/11/19 (!) 340 lb (154.2 kg)   Temp Readings from Last 3 Encounters:  05/26/19 (!) 97.5 F (36.4 C) (Oral)  05/22/19 97.6 F (36.4 C)  05/15/19 97.6 F (36.4 C) (Temporal)   BP Readings from Last 3 Encounters:  05/26/19 (!) 144/72  05/22/19 126/62  05/15/19 (!) 130/54   Pulse Readings from Last 3 Encounters:  05/26/19 80  05/22/19 89  05/15/19 84   Pain Assessment Pain Score: 7 /10  In general this is a well appearing, obese Caucasian male in no acute distress. He is alert and oriented x4, sitting upright, comfortably in the chair and appropriate throughout the examination. HEENT reveals that the patient is normocephalic, atraumatic. EOMs are intact. PERRLA. Skin is intact without any evidence of gross lesions. Cardiovascular exam reveals a regular rate and rhythm, no clicks rubs or murmurs are auscultated. Chest is clear to auscultation bilaterally. Lymphatic assessment is performed and does not reveal any adenopathy in the cervical, supraclavicular, axillary, or inguinal chains. Abdomen is obese and distended but has active bowel sounds in all quadrants and is intact.  Lower extremities are negative for deep calf tenderness, cyanosis or clubbing.  There is 2+ pretibial pitting edema bilaterally.  KPS = 60  100 - Normal; no complaints; no evidence of disease. 90   - Able  to carry on normal activity; minor signs or symptoms of disease. 80   - Normal activity with effort; some signs or symptoms of disease. 56   - Cares for self; unable to carry on normal activity or to do active work. 60   - Requires occasional assistance, but is able to care for most of his personal needs. 50   - Requires considerable assistance and frequent medical care. 75   - Disabled; requires special care and assistance. 93   - Severely disabled; hospital admission is indicated although death not imminent. 48   - Very sick; hospital admission necessary; active supportive treatment necessary. 10   - Moribund; fatal processes progressing rapidly. 0     - Dead  Karnofsky DA, Abelmann [place], Craver LS and Burchenal [facility] [phone]) The use of the nitrogen mustards in the palliative treatment of carcinoma: with particular reference to bronchogenic carcinoma Cancer 1 634-56  LABORATORY DATA:  Lab Results  Component Value Date   WBC 6.2 05/26/2019  HGB 11.6 (L) 05/26/2019   HCT 37.8 (L) 05/26/2019   MCV 91.7 05/26/2019   PLT 153 05/26/2019   Lab Results  Component Value Date   NA 130 (L) 05/26/2019   K 4.2 05/26/2019   CL 93 (L) 05/26/2019   CO2 27 05/26/2019   Lab Results  Component Value Date   ALT 19 05/25/2019   AST 32 05/25/2019   ALKPHOS 109 05/25/2019   BILITOT 1.5 (H) 05/25/2019     RADIOGRAPHY: Ct Abdomen Pelvis W Contrast  Result Date: 05/01/2019 CLINICAL DATA:  2 week history of right upper quadrant pain. EXAM: CT ABDOMEN AND PELVIS WITH CONTRAST TECHNIQUE: Multidetector CT imaging of the abdomen and pelvis was performed using the standard protocol following bolus administration of intravenous contrast. CONTRAST:  150mL ISOVUE-300 IOPAMIDOL (ISOVUE-300) INJECTION 61% COMPARISON:  01/17/2018 FINDINGS: Lower chest: Tiny pulmonary nodules in the posterior right lung base measuring 6 mm on images 1 and 8 of series 3 are stable in the interval, likely benign. Atelectasis or scarring  is noted in the lung bases bilaterally. Hepatobiliary: No suspicious focal abnormality within the liver parenchyma. Gallbladder surgically absent. No intrahepatic or extrahepatic biliary dilation. Pancreas: No focal mass lesion. No dilatation of the main duct. No intraparenchymal cyst. No peripancreatic edema. Spleen: No splenomegaly. No focal mass lesion. Adrenals/Urinary Tract: No adrenal nodule or mass. Right kidney unremarkable. Similar appearance of a 6.5 cm interpolar left renal cyst. Smaller low-density 14 mm interpolar left renal lesion is also stable. No evidence for hydronephrosis. No evidence for hydroureter. Bladder is decompressed. Stomach/Bowel: Stomach is unremarkable. No gastric wall thickening. No evidence of outlet obstruction. Duodenum is normally positioned as is the ligament of Treitz. No small bowel wall thickening. No small bowel dilatation. The terminal ileum is normal. The appendix is not visualized, but there is no edema or inflammation in the region of the cecum. No gross colonic mass. No colonic wall thickening. Vascular/Lymphatic: There is abdominal aortic atherosclerosis without aneurysm. Interval development of lymphadenopathy in the lower mediastinum and abdomen. 13 mm right retrocrural node seen on 12/02. 2.4 cm short axis portal caval node visible on 30/2. 2.3 cm short axis aortocaval node visible on 38/2. 2.8 cm left para-aortic node identified on 50/2. Small lymph nodes are seen in the common iliac chains bilaterally. 15 mm short axis left common femoral node visible on 77/2. Reproductive: Prostate gland is enlarged. Other: No intraperitoneal free fluid. Musculoskeletal: Right groin hernia contains only fat. Multiple new subtle lucent lesions are identified in the bony anatomy including a 13 mm lesion in the L1 vertebral body (32/2), 1.9 cm lesion in the L2 vertebral body (40/2), subtle cortical lesion in the right iliac crest (59/2) and 10 mm sacral lesion on 69/2. IMPRESSION: 1.  Lower mediastinal and abdominal/pelvic lymphadenopathy. This could be related to lymphoma or metastatic disease, but given the interval development of multiple new lucent bone lesions, metastatic disease is favored. No evidence for a primary lesion on the current exam. 2.  Aortic Atherosclerois (ICD10-170.0) 3. Tiny pulmonary nodules in the right lung base are stable since prior study, suggesting benign etiology. These results will be called to the ordering clinician or representative by the Radiologist Assistant, and communication documented in the PACS or zVision Dashboard. Electronically Signed   By: Misty Stanley M.D.   On: 05/01/2019 17:26   Nm Pet Image Initial (pi) Skull Base To Thigh  Result Date: 05/04/2019 CLINICAL DATA:  Initial staging, abdominal lymphadenopathy treatment strategy for abdominal lymphadenopathy. EXAM:  NUCLEAR MEDICINE PET SKULL BASE TO THIGH TECHNIQUE: Sixteen mCi F-18 FDG was injected intravenously. Full-ring PET imaging was performed from the skull base to thigh after the radiotracer. CT data was obtained and used for attenuation correction and anatomic localization. Fasting blood glucose: 113 mg/dl COMPARISON:  None. FINDINGS: Mediastinal blood pool activity: SUV max 1.8 Liver activity: SUV max NA NECK: No hypermetabolic lymph nodes in the neck. Incidental CT findings: none CHEST: Cluster prevascular lymph nodes. Example lymph node measures 0.6 cm prevascular space with SUV max 10.7. Nodes extend to the high LEFT paratracheal nodal station but no clear supraclavicular hypermetabolic nodes. No hypermetabolic axillary nodes Incidental CT findings: Small RIGHT pleural effusion. Mild RIGHT basilar atelectasis. Nodule along the RIGHT pleural surface measures 8 mm (image 89/1 without metabolic activity. Second nodule along the posterior aspect of the LEFT upper lobe against the fissure measures 6 mm (image 57/4 also without metabolic activity. ABDOMEN/PELVIS: Cluster hypermetabolic  lymph nodes in the retroperitoneum along the aorta from the renal veins to the iliac arteries. Example LEFT aorta SUV max equal 6.2 measures 2 cm (image 143/4. Posterior the aorta SUV max equal 7.6. Noted within the mesentery on the SMA adjacent to the uncinate of the pancreas measures 2.2 cm SUV max 10.6. Smaller hypermetabolic nodes extend along the common iliac arteries. No hypermetabolic external iliac nodes or inguinal nodes. Spleen is normal metabolic activity normal volume. No abnormal activity in liver. Incidental CT findings: There is a lesion in the SKELETON: Along the posterior aspect of the T7 vertebral body there is a lytic lesion measuring 2 cm (image 80/4) which has mild metabolic activity (SUV max equal 6.0.) This lesion is new from CT September 23, 6943) Focal metabolic activity in the posterior LEFT iliac bone with SUV max equal 6.9. No clear CT abnormality at this level. Lytic lesion in the L1 vertebral body with mild metabolic activity (SUV max equal 5.7. Incidental CT findings: none IMPRESSION: 1. Hypermetabolic mediastinal prevascular nodes and hypermetabolic retroperitoneal and mesenteric nodes consistent with lymphoma versus granulomatous disease (sarcoidosis). 2. Lytic lesion in the T7 vertebral body with moderate metabolic activity is concerning for skeletal metastasis versus sarcoid lesion. 3. Small pulmonary nodules do not have associated metabolic activity. Recommend attention on follow-up. 4. Unfortunately, there are no large hypermetabolic supraclavicular or inguinal/iliac nodes for biopsy. The hypermetabolic T7 lesion is represented well on CT. The hypermetabolic LEFT iliac bone lesion is not well depicted on CT. Electronically Signed   By: Suzy Bouchard M.D.   On: 05/04/2019 13:06   Ct Biopsy  Result Date: 05/11/2019 INDICATION: Retroperitoneal adenopathy, concern for lymphoproliferative disorder EXAM: CT-GUIDED BIOPSY LEFT RETROPERITONEAL BULKY ADENOPATHY MEDICATIONS: 1%  lidocaine local ANESTHESIA/SEDATION: 0.5 mg IV Versed; 12.5 mcg IV Fentanyl Moderate Sedation Time:  13 minutes The patient was continuously monitored during the procedure by the interventional radiology nurse under my direct supervision. PROCEDURE: The procedure, risks, benefits, and alternatives were explained to the patient. Questions regarding the procedure were encouraged and answered. The patient understands and consents to the procedure. Previous imaging reviewed. Patient positioned prone. Noncontrast localization CT performed. The left bulky retroperitoneal periaortic adenopathy was localized and marked. Under sterile conditions and local anesthesia, a 17 gauge 16.8 cm guide was advanced to the adenopathy. Needle position confirmed with CT. 18 gauge core biopsies obtained. Samples placed in saline. Needle removed. Postprocedure imaging demonstrates no hemorrhage or hematoma. Patient tolerated the procedure well without complication. Vital sign monitoring by nursing staff during the procedure will continue as  patient is in the special procedures unit for post procedure observation. FINDINGS: The images document guide needle placement within the left retroperitoneal periaortic adenopathy. Post biopsy images demonstrate no hemorrhage or hematoma. COMPLICATIONS: None immediate. IMPRESSION: Successful CT-guided core biopsy of the left retroperitoneal periaortic adenopathy Electronically Signed   By: [name].  Shick M.D.   On: 05/11/2019 14:24   Ct Renal Stone Study  Result Date: 05/25/2019 CLINICAL DATA:  Low back pain, generalized abdominal pain EXAM: CT ABDOMEN AND PELVIS WITHOUT CONTRAST TECHNIQUE: Multidetector CT imaging of the abdomen and pelvis was performed following the standard protocol without IV contrast. COMPARISON:  08/31/2018 FINDINGS: Lower chest: small bilateral pleural effusions, new since prior study. Small nodules in the right lower lobe are stable. Bibasilar atelectasis. Heart is normal size.  Mediastinal adenopathy partially imaged on the upper images as seen on prior study. Heart is normal size. Hepatobiliary: No focal liver abnormality is seen. Status post cholecystectomy. No biliary dilatation. Pancreas: No focal abnormality or ductal dilatation. Spleen: Spleen mildly enlarged with a craniocaudal length of 13.9 cm. This is stable when compared to prior study. No focal splenic abnormality. Adrenals/Urinary Tract: Left renal cyst measures 6 cm. No hydronephrosis. Adrenal glands and urinary bladder unremarkable. Stomach/Bowel: Appendix normal. Stomach, large and small bowel grossly unremarkable. Vascular/Lymphatic: Aortic atherosclerosis. No aneurysm. Extensive retroperitoneal adenopathy. This continues into the pelvis/iliac chains. This is also noted in the retrocrural region. This is unchanged since prior study. Reproductive: Mild prostate prominence. Other: No free fluid or free air. There is extensive stranding/soft tissue density noted in the upper retroperitoneum around the adrenal glands. This has progressed significantly since prior study and is of unknown etiology. Musculoskeletal: Lytic lesion within the T7 vertebral body is stable. Lytic lesion within the L2 vertebral body also stable. IMPRESSION: Extensive retroperitoneal adenopathy in the abdomen and pelvis. Adenopathy in the visualized lower mediastinum. This is unchanged since prior study and could reflect lymphoma or metastatic disease. Extensive stranding in the retroperitoneum in the upper abdomen around the adrenal glands. This is of unknown etiology, progressed since prior study. Small bilateral pleural effusions, new since prior study. Stable small lower lobe pulmonary nodules. Bibasilar atelectasis. Splenomegaly, stable. Aortic atherosclerosis. Stable lytic lesions in the T7 and L2 vertebral bodies. Electronically Signed   By: Rolm Baptise M.D.   On: 05/25/2019 00:04      IMPRESSION/PLAN: 1. 73 y.o. 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 Today, after reviewing the patient's case and imaging with Dr. Tammi Klippel, I talked to the patient about the findings and workup thus far. We discussed the natural history of metastatic carcinoma and general treatment, highlighting the role of palliative radiotherapy in the management of painful/symptomatic metastases. We discussed the available radiation techniques, and focused on the details and logistics of delivery.  Dr. Tammi Klippel recommends a 2-week course of palliative radiotherapy directed at the symptomatic metastatic lesions in the T7 and L2 vertebral bodies.  We reviewed the anticipated acute and late sequelae associated with radiation in this setting. The patient was encouraged to ask questions that were answered to his satisfaction.    At the conclusion of our conversation, the patient elects to proceed with palliative radiotherapy as recommended.  He appears to have a good understanding of his disease and our recommendations which are of palliative intent.  He has freely signed written consent to proceed and a copy of this document will be placed in his medical  record.  We will plan to bring him down for CT simulation this afternoon for treatment planning in anticipation of beginning his treatments on Monday, 05/29/2019.  We feel that it would be in the patient's best interest to remain in the hospital for the first 1-2 radiation treatments but he understands that once his hospital medical team deems him stable and safe for discharge home, he can continue his radiation treatments on an outpatient basis. He is comfortable and in agreement with the stated plan.  In a visit lasting 70 minutes, greater than 50% of that time was spent in floor time, discussing his case and coordinating his care.   Nicholos Johns, PA-C    Tyler Pita, MD  [facility] Direct Dial: [phone]   Fax: [phone] [email or web address]   Skype   [email or web address]

## 2019-05-26 NOTE — Progress Notes (Signed)
PROGRESS NOTE    Shane Watson  IRW:[id number]  DOB: 13-May-1946  DOA: 06/14/2019 PCP: Shirline Frees, MD  Brief Narrative: 73 year old male with history of hypertension, chronic back pain/neuropathy, BPH, CKD stage III, COPD, CAD, diabetes mellitus, GERD, obstructive sleep apnea on CPAP evaluated by PCP few weeks back for back pain/abdominal pain, underwent outpatient work up with CT scan of the abdomen pelvis on 10/12 showing lower mediastinal and abdominal/pelvic lymphadenopathy  related to lymphoma or metastatic disease as well as tiny pulmonary nodules in the right lung base which were stable since the prior study->PET scan performed on 05/04/2019.  PET scan results showed hypermetabolic mediastinal prevascular nodes and hypermetabolic retroperitoneal and mesenteric nodes consistent with lymphoma versus granulomatous disease (sarcoidosis), lytic lesion in the T7 vertebral body with moderate metabolic activity concerning for skeletal metastasis versus sarcoid lesion, small pulmonary nodules do not have associated metabolic activity-->patient underwent CT-guided retroperitoneal lymph node biopsy by IR on 10/22 and postprocedure complained of pain radiating to his groin-NSAIDs were recommended.  Pathology results indicate undifferentiated adenocarcinoma --> He was referred for oncology evaluation and was scheduled to see Dr Irene Limbo on 11/10. He however has had worsening back pain and presented to Urgent care on 11/2 and prescribed 73m Oxycodone-1 tab was not enough and 2 tabs were making him too drowsy. Patient was also seen by his primary pulmonologist recently as outpatient for chest/abdominal pain complaints- felt to have subdiaphragmatic pain/bloating and dietary changes were advised.  Last colonoscopy was 3 years back.  [facility] complaints of persistent back pain radiating to left anterior abdomen.  He also reports worsening lower extremity edema.   ED course:T 98.8, P  77 R 17, Bp 139/54, pox 91% on RA.Wbc 7.8, Hgb 12.0, Plt 153 Na 131, K 4.4, Bun 30, Creatinine 2.18.Ast 32, Alt 19, Alk phos 114, T. Bili 1.1 CT renal revealed extensive retroperitoneal adenopathy in the abdomen and pelvis and in the visualized lower mediastinum. This is unchanged since prior study.  Extensive stranding in the retroperitoneum in the upper abdomenaround the adrenal glands. This is of unknown etiology, progressed since prior study.  New small bilateral pleural effusions,splenomegaly, stable lytic lesions in T7/L2 vertebral bodies. Patient admitted for back pain symptom management with Dilaudid 1 mg IV every 4 as needed, IV fluids for AKI and oncology evaluation as inpatient.   Subjective: Sitting in bedside chair, complaining of pain at 8/10 this morning.  Waiting for nurse to bring IV Dilaudid.  Feels leg swellings are somewhat improved.  Down to 2 L nasal cannula.  Objective: Vitals:   05/26/19 0315 05/26/19 0619 05/26/19 0632 05/26/19 0759  BP: (!) 156/66 (!) 157/68    Pulse: 85 94    Resp:  20    Temp:  98.2 F (36.8 C)    TempSrc:  Oral    SpO2:  94%  93%  Weight:   (!) 157.9 kg   Height:        Intake/Output Summary (Last 24 hours) at 05/26/2019 0810 Last data filed at 05/26/2019 07619Gross per 24 hour  Intake 1080 ml  Output 1200 ml  Net -120 ml   Filed Weights   05/30/2019 2040 05/25/19 0340 05/26/19 05093 Weight: (!) 157.9 kg (!) 159.7 kg (!) 157.9 kg    Physical Examination:  General exam: Appears calm and comfortable  Respiratory system: Clear to auscultation. Respiratory effort normal. Cardiovascular system: S1 & S2 heard, RRR. No JVD, murmurs. 2+ pedal  edema. Gastrointestinal system: Abdomen is obese, soft and nontender. Normal bowel sounds heard. Central nervous system: Alert and oriented. No focal neurological deficits. Extremities: Symmetric 5 x 5 power.  2+ pitting bilateral leg edema Skin: No rashes, lesions or ulcers Psychiatry: Judgement and  insight appear normal. Mood & affect appropriate.     Data Reviewed: I have personally reviewed following labs and imaging studies  CBC: Recent Labs  Lab 06/05/2019 2057 05/25/19 0109 05/26/19 0327  WBC 6.9 7.8 6.2  HGB 11.9* 12.0* 11.6*  HCT 38.3* 38.4* 37.8*  MCV 91.4 92.1 91.7  PLT 150 153 128   Basic Metabolic Panel: Recent Labs  Lab 06/14/2019 2057 05/25/19 0109 05/26/19 0327  NA 131* 131* 130*  K 4.5 4.4 4.2  CL 92* 93* 93*  CO2 32 30 27  GLUCOSE 121* 129* 122*  BUN 30* 35* 29*  CREATININE 2.18* 2.19* 1.49*  CALCIUM 8.9 8.9 8.7*   GFR: Estimated Creatinine Clearance: 66.6 mL/min (A) (by C-G formula based on SCr of 1.49 mg/dL (H)). Liver Function Tests: Recent Labs  Lab 06/15/2019 2057 05/25/19 0109  AST 32 32  ALT 19 19  ALKPHOS 114 109  BILITOT 1.1 1.5*  PROT 5.6* 5.6*  ALBUMIN 3.1* 3.3*   Recent Labs  Lab 06/14/2019 2057  LIPASE 55*   No results for input(s): AMMONIA in the last 168 hours. Coagulation Profile: No results for input(s): INR, PROTIME in the last 168 hours. Cardiac Enzymes: No results for input(s): CKTOTAL, CKMB, CKMBINDEX, TROPONINI in the last 168 hours. BNP (last 3 results) No results for input(s): PROBNP in the last 8760 hours. HbA1C: No results for input(s): HGBA1C in the last 72 hours. CBG: No results for input(s): GLUCAP in the last 168 hours. Lipid Profile: No results for input(s): CHOL, HDL, LDLCALC, TRIG, CHOLHDL, LDLDIRECT in the last 72 hours. Thyroid Function Tests: No results for input(s): TSH, T4TOTAL, FREET4, T3FREE, THYROIDAB in the last 72 hours. Anemia Panel: No results for input(s): VITAMINB12, FOLATE, FERRITIN, TIBC, IRON, RETICCTPCT in the last 72 hours. Sepsis Labs: No results for input(s): PROCALCITON, LATICACIDVEN in the last 168 hours.  Recent Results (from the past 240 hour(s))  SARS CORONAVIRUS 2 (TAT 6-24 HRS) Nasopharyngeal Nasopharyngeal Swab     Status: None   Collection Time: 05/25/19  1:09 AM    Specimen: Nasopharyngeal Swab  Result Value Ref Range Status   SARS Coronavirus 2 NEGATIVE NEGATIVE Final    Comment: (NOTE) SARS-CoV-2 target nucleic acids are NOT DETECTED. The SARS-CoV-2 RNA is generally detectable in upper and lower respiratory specimens during the acute phase of infection. Negative results do not preclude SARS-CoV-2 infection, do not rule out co-infections with other pathogens, and should not be used as the sole basis for treatment or other patient management decisions. Negative results must be combined with clinical observations, patient history, and epidemiological information. The expected result is Negative. Fact Sheet for Patients: SugarRoll.be Fact Sheet for Healthcare Providers: https://www.woods-mathews.com/ This test is not yet approved or cleared by the [place] FDA and  has been authorized for detection and/or diagnosis of SARS-CoV-2 by FDA under an Emergency Use Authorization (EUA). This EUA will remain  in effect (meaning this test can be used) for the duration of the COVID-19 declaration under Section 56 4(b)(1) of the Act, 21 U.S.C. section 360bbb-3(b)(1), unless the authorization is terminated or revoked sooner. Performed at [facility], [place] [address], [place], [place] [address]       Radiology Studies: Ct Renal  Stone Study  Result Date: 05/25/2019 CLINICAL DATA:  Low back pain, generalized abdominal pain EXAM: CT ABDOMEN AND PELVIS WITHOUT CONTRAST TECHNIQUE: Multidetector CT imaging of the abdomen and pelvis was performed following the standard protocol without IV contrast. COMPARISON:  08/31/2018 FINDINGS: Lower chest: small bilateral pleural effusions, new since prior study. Small nodules in the right lower lobe are stable. Bibasilar atelectasis. Heart is normal size. Mediastinal adenopathy partially imaged on the upper images as seen on prior study. Heart is normal size. Hepatobiliary: No  focal liver abnormality is seen. Status post cholecystectomy. No biliary dilatation. Pancreas: No focal abnormality or ductal dilatation. Spleen: Spleen mildly enlarged with a craniocaudal length of 13.9 cm. This is stable when compared to prior study. No focal splenic abnormality. Adrenals/Urinary Tract: Left renal cyst measures 6 cm. No hydronephrosis. Adrenal glands and urinary bladder unremarkable. Stomach/Bowel: Appendix normal. Stomach, large and small bowel grossly unremarkable. Vascular/Lymphatic: Aortic atherosclerosis. No aneurysm. Extensive retroperitoneal adenopathy. This continues into the pelvis/iliac chains. This is also noted in the retrocrural region. This is unchanged since prior study. Reproductive: Mild prostate prominence. Other: No free fluid or free air. There is extensive stranding/soft tissue density noted in the upper retroperitoneum around the adrenal glands. This has progressed significantly since prior study and is of unknown etiology. Musculoskeletal: Lytic lesion within the T7 vertebral body is stable. Lytic lesion within the L2 vertebral body also stable. IMPRESSION: Extensive retroperitoneal adenopathy in the abdomen and pelvis. Adenopathy in the visualized lower mediastinum. This is unchanged since prior study and could reflect lymphoma or metastatic disease. Extensive stranding in the retroperitoneum in the upper abdomen around the adrenal glands. This is of unknown etiology, progressed since prior study. Small bilateral pleural effusions, new since prior study. Stable small lower lobe pulmonary nodules. Bibasilar atelectasis. Splenomegaly, stable. Aortic atherosclerosis. Stable lytic lesions in the T7 and L2 vertebral bodies. Electronically Signed   By: Rolm Baptise M.D.   On: 05/25/2019 00:04        Scheduled Meds: . aspirin EC  81 mg Oral Daily  . atorvastatin  40 mg Oral Daily  . colesevelam  625 mg Oral BID WC  . diltiazem  90 mg Oral Daily  . doxazosin  8 mg Oral  QHS  . enoxaparin (LOVENOX) injection  60 mg Subcutaneous Q24H  . famotidine  20 mg Oral QHS  . furosemide  20 mg Intravenous Daily  . gabapentin  300 mg Oral BID  . lidocaine  3 patch Transdermal Q24H  . linagliptin  5 mg Oral Daily  . loratadine  10 mg Oral Daily  . magnesium oxide  400 mg Oral Daily  . meclizine  25 mg Oral Daily  . mometasone-formoterol  2 puff Inhalation BID  . pantoprazole  40 mg Oral Daily  . polyethylene glycol  17 g Oral Daily   Continuous Infusions:  Assessment & Plan:    1.Intractable back pain/Lytic bony lesions: in the setting of #4. Recent PSA wnl. Pain better controlled on IV diluadid/neurontin/lidocaine patch currently.  Consulted radiation /concology for consideration of palliative radiation/symptom relief.   2. Leg swellings- Could be related to hypoalbuminemia. CT reported small bilateral pleural effusions and patient has 3+ pitting leg edema on clinical exam. Started on IV lasix , monitor renal function. Check BNP/Echo.  3. AKI on CKD stage 3a: HCTZ/losartan held on admission and briefly hydrated. Now off IV fluids in concern for #2 and being diuresed. Baseline creatinine 1.4. No evedence of hydronephrosis/ renal calculi on CT  but reported extensive stranding in the retroperitoneum in the upper abdomen around the adrenal glands  4. Metastatic adenocarcinoma: Appreciate Oncology evaluation and recommendations. Unknown primary. Last colonoscopy 3 yrs back. Never had prostrate biopsy. Recent PSA wnl   5. Acute on chronic hypoxic respiratory failure: Patient uses CPAP/nocturnal O2 -2lits at baseline. Currently requiring 2-3 lits continuous. CT with atelectasis/small pleural effusion. Continue IS, IV diuretics and taper 02 as tolerated. 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   6. BPH - reports seeing a Urologist in the past. Mild hypertrophy reported on CT pelvis.Resume home meds doxazosin/oxybutinin)   7. HTN: resumed home meds except Losartan/HCTZ.   8. GERD: Famotidine/PPI  9. Diabetes Mellitus: Tradjenta. Add SSI  10. Hyperlipidemia: on Welchol/Lipitor  11. Sleep apnea/COPD: Follows pulmonary as outpatient. Resume CPAP/home inhaler therapy (recently changed per patient)   DVT prophylaxis: Lovenox Code Status: Full code Family / Patient Communication: d/w patient. Family not present bedside Disposition Plan: TBD     LOS: 1 day    Time spent: 35 minutes    Guilford Shi, MD [facility] Pager [phone]  If 7PM-7AM, please contact night-coverage [email or web address] Password [email or web address] 05/26/2019, 8:10 AM

## 2019-05-26 NOTE — Progress Notes (Signed)
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 Bone metastases (HCC)  C79.51     HISTORY OF PRESENT ILLNESS: Shane Watson is a 73 y.o. male seen at the request of Dr. Irene Watson. He initially presented to his PCP with a 2 week history of right upper quadrant pain. He underwent abdomen/pelvis CT on 05/01/2019, which revealed: lower mediastinal and abdominal/pelvic lymphadenopathy, metastatic disease favored but not evidence of primary lesion on current exam.  He proceeded to PET scan on 05/04/2019, which showed: hypermetabolic mediastinal prevascular nodes and hypermetabolic retroperitoneal and mesenteric nodes consistent with lymphoma versus granulomatous disease; lytic lesion in the T7 vertebral body with moderate metabolic activity concerning for skeletal metastasis; small pulmonary nodules without associated metabolic activity.  He underwent CT-guided biopsy of the left retroperitoneal periaortic adenopathy on 05/11/2019. Pathology from the procedure revealed: poorly differentiated adenocarcinoma; lymphoid tissue not identified.  He presented to urgent care with worsening back pain on 05/22/2019 and was given oxycodone. Unfortunately, his back pain continued to worsen, and he presented to the ED on 05/26/2019 and was admitted.   He underwent CT renal stone study that day, which showed: unchanged extensive retroperitoneal adenopathy in the abdomen and pelvis with adenopathy in the visualized lower mediastinum; progressed extensive stranding in the retroperitoneum in the upper abdomen around the adrenal glands; new small bilateral pleural effusions; stable small lower lobe pulmonary  nodules; stable lytic lesions in the T7 and L2 vertebral bodies.  Dr. Irene Watson, whom the patient was scheduled to meet with on 05/30/2019, met with the patient in consultation at the hospital. Per his note, Dr. Irene Watson ordered tumor marker studies and discussed palliative chemotherapy with the patient.  He has been kindly referred to [place] today to discuss potential pain management palliative radiation therapy to the spine.  PREVIOUS RADIATION THERAPY: No  PAST MEDICAL HISTORY:  Past Medical History:  Diagnosis Date   Allergy    Anemia    Arthritis    BPH (benign prostatic hyperplasia)    CKD (chronic kidney disease), stage III    Complication of anesthesia    COPD (chronic obstructive pulmonary disease) ([place])    a. Uses Home O2 w/ exertion.   Coronary artery disease    a. 11/2010 Cath: LM nl, LAD 65m(3.5x22 and 3.5x12 Resolute DES'), LCX nl, OM1/2/3 nl, RCA 20-30p, PDA nl.    Diabetes mellitus without complication (HCC)    GERD (gastroesophageal reflux disease)    H/O echocardiogram    a. per notes in 11/2010 - nl EF, basal inf wma.   Heart murmur    History of kidney stones    Hyperlipidemia    Hypertensive heart disease    Hyperthyroidism    Neuropathy    Neuropathy    OSA (obstructive sleep apnea)    a. Uses CPAP.   PONV (postoperative nausea and vomiting)    Vertigo       PAST SURGICAL HISTORY: Past Surgical History:  Procedure Laterality Date   2 heart stents  12/18/10   CARDIAC CATHETERIZATION     CHOLECYSTECTOMY     COLONOSCOPY     REVERSE SHOULDER ARTHROPLASTY Right 10/08/2015   REVERSE SHOULDER ARTHROPLASTY  Right 10/08/2015   Procedure: REVERSE SHOULDER ARTHROPLASTY;  Surgeon: Shane Cedars, MD;  Location: [facility];  Service: Orthopedics;  Laterality: Right;   right knee surgery Right 2008ish   "scraped it out"    FAMILY HISTORY:  Family History  Problem Relation Age of Onset   Diabetes Mother    Heart disease Father    Diabetes Father     Heart disease Sister    Diabetes Sister     SOCIAL HISTORY:  Social History   Socioeconomic History   Marital status: Single    Spouse name: Not on file   Number of children: Not on file   Years of education: Not on file   Highest education level: Not on file  Occupational History   Occupation: Retired    Comment: Merchandiser, retail strain: Not on file   Food insecurity    Worry: Not on file    Inability: Not on file   Transportation needs    Medical: Not on file    Non-medical: Not on file  Tobacco Use   Smoking status: Former Smoker    Packs/day: 1.50    Years: 15.00    Pack years: 22.50    Types: Cigarettes    Quit date: 04/29/1987    Years since quitting: 32.1   Smokeless tobacco: Never Used  Substance and Sexual Activity   Alcohol use: Yes    Comment: Rare   Drug use: No   Sexual activity: Not on file  Lifestyle   Physical activity    Days per week: Not on file    Minutes per session: Not on file   Stress: Not on file  Relationships   Social connections    Talks on phone: Not on file    Gets together: Not on file    Attends religious service: Not on file    Active member of club or organization: Not on file    Attends meetings of clubs or organizations: Not on file    Relationship status: Not on file   Intimate partner violence    Fear of current or ex partner: Not on file    Emotionally abused: Not on file    Physically abused: Not on file    Forced sexual activity: Not on file  Other Topics Concern   Not on file  Social History Narrative   Not on file    ALLERGIES: Aspirin, Ibuprofen, Penicillins, and Sulfa antibiotics  MEDICATIONS:  No current facility-administered medications for this encounter.    No current outpatient medications on file.   Facility-Administered Medications Ordered in Other Encounters  Medication Dose Route Frequency Provider Last Rate Last Dose   acetaminophen  (TYLENOL) tablet 650 mg  650 mg Oral Q6H PRN Jani Gravel, MD       Or   acetaminophen (TYLENOL) suppository 650 mg  650 mg Rectal Q6H PRN Jani Gravel, MD       albuterol (PROVENTIL) (2.5 MG/3ML) 0.083% nebulizer solution 3 mL  3 mL Inhalation Q4H PRN Jani Gravel, MD       aspirin EC tablet 81 mg  81 mg Oral Daily Jani Gravel, MD   81 mg at 05/28/19 1007   atorvastatin (LIPITOR) tablet 40 mg  40 mg Oral Daily Jani Gravel, MD   40 mg at 05/28/19 1007   bupivacaine-epinephrine (MARCAINE W/ EPI) 0.5% -1:200000 injection    Anesthesia Intra-op Lorrene Reid, MD   25 mL at 10/07/15 1734  colesevelam [facility]) tablet 625 mg  625 mg Oral BID WC Jani Gravel, MD   625 mg at 05/28/19 1659   diltiazem (CARDIZEM SR) 12 hr capsule 90 mg  90 mg Oral Daily Jani Gravel, MD   90 mg at 05/28/19 1007   diltiazem (CARDIZEM) tablet 60 mg  60 mg Oral QID PRN Jani Gravel, MD       diphenoxylate-atropine (LOMOTIL) 2.5-0.025 MG per tablet 1 tablet  1 tablet Oral QID PRN Jani Gravel, MD       doxazosin (CARDURA) tablet 8 mg  8 mg Oral QHS Jani Gravel, MD   8 mg at 05/27/19 2158   enoxaparin (LOVENOX) injection 60 mg  60 mg Subcutaneous Q24H Jani Gravel, MD   60 mg at 05/28/19 1007   famotidine (PEPCID) tablet 20 mg  20 mg Oral Loma Sousa, MD   20 mg at 05/27/19 2158   gabapentin (NEURONTIN) capsule 300 mg  300 mg Oral BID Jani Gravel, MD   300 mg at 05/28/19 1007   hydrALAZINE (APRESOLINE) injection 10 mg  10 mg Intravenous Q6H PRN Jani Gravel, MD       HYDROmorphone (DILAUDID) injection 1 mg  1 mg Intravenous Q4H PRN Jani Gravel, MD   1 mg at 05/28/19 1541   insulin aspart (novoLOG) injection 0-15 Units  0-15 Units Subcutaneous TID WC Guilford Shi, MD   2 Units at 05/28/19 1659   insulin aspart (novoLOG) injection 0-5 Units  0-5 Units Subcutaneous QHS Guilford Shi, MD       lidocaine (LIDODERM) 5 % 3 patch  3 patch Transdermal Q24H Palumbo, April, MD   3 patch at 05/27/19 2353   linagliptin (TRADJENTA)  tablet 5 mg  5 mg Oral Daily Jani Gravel, MD   5 mg at 05/28/19 1007   loratadine (CLARITIN) tablet 10 mg  10 mg Oral Daily Jani Gravel, MD   10 mg at 05/28/19 1007   magnesium oxide (MAG-OX) tablet 400 mg  400 mg Oral Daily Guilford Shi, MD   400 mg at 05/28/19 1007   meclizine (ANTIVERT) tablet 25 mg  25 mg Oral Daily Jani Gravel, MD   25 mg at 05/28/19 1007   mometasone-formoterol (DULERA) 200-5 MCG/ACT inhaler 2 puff  2 puff Inhalation BID Jani Gravel, MD   2 puff at 05/28/19 0845   ondansetron (ZOFRAN) injection 4 mg  4 mg Intravenous Q6H PRN Jani Gravel, MD       oxybutynin (DITROPAN) tablet 5 mg  5 mg Oral Daily PRN Jani Gravel, MD       pantoprazole (PROTONIX) EC tablet 40 mg  40 mg Oral Daily Jani Gravel, MD   40 mg at 05/28/19 1007   polyethylene glycol (MIRALAX / GLYCOLAX) packet 17 g  17 g Oral Daily Guilford Shi, MD   17 g at 05/28/19 1007   polyvinyl alcohol (LIQUIFILM TEARS) 1.4 % ophthalmic solution 2 drop  2 drop Both Eyes PRN Guilford Shi, MD        REVIEW OF SYSTEMS:  On review of systems, the patient reports that he is doing well overall. He denies any chest pain, shortness of breath, cough, fevers, chills, night sweats, unintended weight changes. He denies any bowel or bladder disturbances, and denies abdominal pain, nausea or vomiting. He denies any new musculoskeletal or joint aches or pains. A complete review of systems is obtained and is otherwise negative.    PHYSICAL EXAM:  Wt Readings from Last 3 Encounters:  05/28/19 Marland Kitchen)  355 lb 11.2 oz (161.3 kg)  05/15/19 (!) 348 lb (157.9 kg)  05/11/19 (!) 340 lb (154.2 kg)   Temp Readings from Last 3 Encounters:  05/28/19 97.9 F (36.6 C) (Oral)  05/22/19 97.6 F (36.4 C)  05/15/19 97.6 F (36.4 C) (Temporal)   BP Readings from Last 3 Encounters:  05/28/19 (!) 161/57  05/22/19 126/62  05/15/19 (!) 130/54   Pulse Readings from Last 3 Encounters:  05/28/19 82  05/22/19 89  05/15/19 84    /10  In  general this is a well appearing man gentleman in no acute distress. He is alert and oriented x4 and appropriate throughout the examination. HEENT reveals that the patient is normocephalic, atraumatic. EOMs are intact. PERRLA. Skin is intact without any evidence of gross lesions. Cardiovascular exam reveals a regular rate and rhythm, no clicks rubs or murmurs are auscultated. Chest is clear to auscultation bilaterally. Lymphatic assessment is performed and does not reveal any adenopathy in the cervical, supraclavicular, axillary, or inguinal chains. Abdomen has active bowel sounds in all quadrants and is intact. The abdomen is soft, non tender, non distended. Lower extremities are negative for pretibial pitting edema, deep calf tenderness, cyanosis or clubbing.   KPS = 40  100 - Normal; no complaints; no evidence of disease. 90   - Able to carry on normal activity; minor signs or symptoms of disease. 80   - Normal activity with effort; some signs or symptoms of disease. 78   - Cares for self; unable to carry on normal activity or to do active work. 60   - Requires occasional assistance, but is able to care for most of his personal needs. 50   - Requires considerable assistance and frequent medical care. 62   - Disabled; requires special care and assistance. 25   - Severely disabled; hospital admission is indicated although death not imminent. 84   - Very sick; hospital admission necessary; active supportive treatment necessary. 10   - Moribund; fatal processes progressing rapidly. 0     - Dead  Karnofsky DA, Abel[place], Craver LS and [facility] [phone]) The use of the nitrogen mustards in the palliative treatment of carcinoma: with particular reference to bronchogenic carcinoma Cancer 1 634-56  LABORATORY DATA:  Lab Results  Component Value Date   WBC 6.2 05/26/2019   HGB 11.6 (L) 05/26/2019   HCT 37.8 (L) 05/26/2019   MCV 91.7 05/26/2019   PLT 153 05/26/2019   Lab Results  Component  Value Date   NA 129 (L) 05/28/2019   K 4.7 05/28/2019   CL 90 (L) 05/28/2019   CO2 30 05/28/2019   Lab Results  Component Value Date   ALT 19 05/25/2019   AST 32 05/25/2019   ALKPHOS 109 05/25/2019   BILITOT 1.5 (H) 05/25/2019     RADIOGRAPHY: Ct Abdomen Pelvis W Contrast  Result Date: 05/01/2019 CLINICAL DATA:  2 week history of right upper quadrant pain. EXAM: CT ABDOMEN AND PELVIS WITH CONTRAST TECHNIQUE: Multidetector CT imaging of the abdomen and pelvis was performed using the standard protocol following bolus administration of intravenous contrast. CONTRAST:  151m ISOVUE-300 IOPAMIDOL (ISOVUE-300) INJECTION 61% COMPARISON:  01/17/2018 FINDINGS: Lower chest: Tiny pulmonary nodules in the posterior right lung base measuring 6 mm on images 1 and 8 of series 3 are stable in the interval, likely benign. Atelectasis or scarring is noted in the lung bases bilaterally. Hepatobiliary: No suspicious focal abnormality within the liver parenchyma. Gallbladder surgically absent. No intrahepatic or  extrahepatic biliary dilation. Pancreas: No focal mass lesion. No dilatation of the main duct. No intraparenchymal cyst. No peripancreatic edema. Spleen: No splenomegaly. No focal mass lesion. Adrenals/Urinary Tract: No adrenal nodule or mass. Right kidney unremarkable. Similar appearance of a 6.5 cm interpolar left renal cyst. Smaller low-density 14 mm interpolar left renal lesion is also stable. No evidence for hydronephrosis. No evidence for hydroureter. Bladder is decompressed. Stomach/Bowel: Stomach is unremarkable. No gastric wall thickening. No evidence of outlet obstruction. Duodenum is normally positioned as is the ligament of Treitz. No small bowel wall thickening. No small bowel dilatation. The terminal ileum is normal. The appendix is not visualized, but there is no edema or inflammation in the region of the cecum. No gross colonic mass. No colonic wall thickening. Vascular/Lymphatic: There is  abdominal aortic atherosclerosis without aneurysm. Interval development of lymphadenopathy in the lower mediastinum and abdomen. 13 mm right retrocrural node seen on 12/02. 2.4 cm short axis portal caval node visible on 30/2. 2.3 cm short axis aortocaval node visible on 38/2. 2.8 cm left para-aortic node identified on 50/2. Small lymph nodes are seen in the common iliac chains bilaterally. 15 mm short axis left common femoral node visible on 77/2. Reproductive: Prostate gland is enlarged. Other: No intraperitoneal free fluid. Musculoskeletal: Right groin hernia contains only fat. Multiple new subtle lucent lesions are identified in the bony anatomy including a 13 mm lesion in the L1 vertebral body (32/2), 1.9 cm lesion in the L2 vertebral body (40/2), subtle cortical lesion in the right iliac crest (59/2) and 10 mm sacral lesion on 69/2. IMPRESSION: 1. Lower mediastinal and abdominal/pelvic lymphadenopathy. This could be related to lymphoma or metastatic disease, but given the interval development of multiple new lucent bone lesions, metastatic disease is favored. No evidence for a primary lesion on the current exam. 2.  Aortic Atherosclerois (ICD10-170.0) 3. Tiny pulmonary nodules in the right lung base are stable since prior study, suggesting benign etiology. These results will be called to the ordering clinician or representative by the Radiologist Assistant, and communication documented in the PACS or zVision Dashboard. Electronically Signed   By: Misty Stanley M.D.   On: 05/01/2019 17:26   Nm Pet Image Initial (pi) Skull Base To Thigh  Result Date: 05/04/2019 CLINICAL DATA:  Initial staging, abdominal lymphadenopathy treatment strategy for abdominal lymphadenopathy. EXAM: NUCLEAR MEDICINE PET SKULL BASE TO THIGH TECHNIQUE: Sixteen mCi F-18 FDG was injected intravenously. Full-ring PET imaging was performed from the skull base to thigh after the radiotracer. CT data was obtained and used for attenuation  correction and anatomic localization. Fasting blood glucose: 113 mg/dl COMPARISON:  None. FINDINGS: Mediastinal blood pool activity: SUV max 1.8 Liver activity: SUV max NA NECK: No hypermetabolic lymph nodes in the neck. Incidental CT findings: none CHEST: Cluster prevascular lymph nodes. Example lymph node measures 0.6 cm prevascular space with SUV max 10.7. Nodes extend to the high LEFT paratracheal nodal station but no clear supraclavicular hypermetabolic nodes. No hypermetabolic axillary nodes Incidental CT findings: Small RIGHT pleural effusion. Mild RIGHT basilar atelectasis. Nodule along the RIGHT pleural surface measures 8 mm (image 40/9 without metabolic activity. Second nodule along the posterior aspect of the LEFT upper lobe against the fissure measures 6 mm (image 57/4 also without metabolic activity. ABDOMEN/PELVIS: Cluster hypermetabolic lymph nodes in the retroperitoneum along the aorta from the renal veins to the iliac arteries. Example LEFT aorta SUV max equal 6.2 measures 2 cm (image 143/4. Posterior the aorta SUV max equal 7.6. Noted within  the mesentery on the SMA adjacent to the uncinate of the pancreas measures 2.2 cm SUV max 10.6. Smaller hypermetabolic nodes extend along the common iliac arteries. No hypermetabolic external iliac nodes or inguinal nodes. Spleen is normal metabolic activity normal volume. No abnormal activity in liver. Incidental CT findings: There is a lesion in the SKELETON: Along the posterior aspect of the T7 vertebral body there is a lytic lesion measuring 2 cm (image 80/4) which has mild metabolic activity (SUV max equal 6.0.) This lesion is new from CT September 22, 7652) Focal metabolic activity in the posterior LEFT iliac bone with SUV max equal 6.9. No clear CT abnormality at this level. Lytic lesion in the L1 vertebral body with mild metabolic activity (SUV max equal 5.7. Incidental CT findings: none IMPRESSION: 1. Hypermetabolic mediastinal prevascular nodes and  hypermetabolic retroperitoneal and mesenteric nodes consistent with lymphoma versus granulomatous disease (sarcoidosis). 2. Lytic lesion in the T7 vertebral body with moderate metabolic activity is concerning for skeletal metastasis versus sarcoid lesion. 3. Small pulmonary nodules do not have associated metabolic activity. Recommend attention on follow-up. 4. Unfortunately, there are no large hypermetabolic supraclavicular or inguinal/iliac nodes for biopsy. The hypermetabolic T7 lesion is represented well on CT. The hypermetabolic LEFT iliac bone lesion is not well depicted on CT. Electronically Signed   By: Suzy Bouchard M.D.   On: 05/04/2019 13:06   Ct Biopsy  Result Date: 05/11/2019 INDICATION: Retroperitoneal adenopathy, concern for lymphoproliferative disorder EXAM: CT-GUIDED BIOPSY LEFT RETROPERITONEAL BULKY ADENOPATHY MEDICATIONS: 1% lidocaine local ANESTHESIA/SEDATION: 0.5 mg IV Versed; 12.5 mcg IV Fentanyl Moderate Sedation Time:  13 minutes The patient was continuously monitored during the procedure by the interventional radiology nurse under my direct supervision. PROCEDURE: The procedure, risks, benefits, and alternatives were explained to the patient. Questions regarding the procedure were encouraged and answered. The patient understands and consents to the procedure. Previous imaging reviewed. Patient positioned prone. Noncontrast localization CT performed. The left bulky retroperitoneal periaortic adenopathy was localized and marked. Under sterile conditions and local anesthesia, a 17 gauge 16.8 cm guide was advanced to the adenopathy. Needle position confirmed with CT. 18 gauge core biopsies obtained. Samples placed in saline. Needle removed. Postprocedure imaging demonstrates no hemorrhage or hematoma. Patient tolerated the procedure well without complication. Vital sign monitoring by nursing staff during the procedure will continue as patient is in the special procedures unit for post  procedure observation. FINDINGS: The images document guide needle placement within the left retroperitoneal periaortic adenopathy. Post biopsy images demonstrate no hemorrhage or hematoma. COMPLICATIONS: None immediate. IMPRESSION: Successful CT-guided core biopsy of the left retroperitoneal periaortic adenopathy Electronically Signed   By: [name].  Shick M.D.   On: 05/11/2019 14:24   Ct Renal Stone Study  Result Date: 05/25/2019 CLINICAL DATA:  Low back pain, generalized abdominal pain EXAM: CT ABDOMEN AND PELVIS WITHOUT CONTRAST TECHNIQUE: Multidetector CT imaging of the abdomen and pelvis was performed following the standard protocol without IV contrast. COMPARISON:  08/31/2018 FINDINGS: Lower chest: small bilateral pleural effusions, new since prior study. Small nodules in the right lower lobe are stable. Bibasilar atelectasis. Heart is normal size. Mediastinal adenopathy partially imaged on the upper images as seen on prior study. Heart is normal size. Hepatobiliary: No focal liver abnormality is seen. Status post cholecystectomy. No biliary dilatation. Pancreas: No focal abnormality or ductal dilatation. Spleen: Spleen mildly enlarged with a craniocaudal length of 13.9 cm. This is stable when compared to prior study. No focal splenic abnormality. Adrenals/Urinary Tract: Left renal cyst  measures 6 cm. No hydronephrosis. Adrenal glands and urinary bladder unremarkable. Stomach/Bowel: Appendix normal. Stomach, large and small bowel grossly unremarkable. Vascular/Lymphatic: Aortic atherosclerosis. No aneurysm. Extensive retroperitoneal adenopathy. This continues into the pelvis/iliac chains. This is also noted in the retrocrural region. This is unchanged since prior study. Reproductive: Mild prostate prominence. Other: No free fluid or free air. There is extensive stranding/soft tissue density noted in the upper retroperitoneum around the adrenal glands. This has progressed significantly since prior study and is of  unknown etiology. Musculoskeletal: Lytic lesion within the T7 vertebral body is stable. Lytic lesion within the L2 vertebral body also stable. IMPRESSION: Extensive retroperitoneal adenopathy in the abdomen and pelvis. Adenopathy in the visualized lower mediastinum. This is unchanged since prior study and could reflect lymphoma or metastatic disease. Extensive stranding in the retroperitoneum in the upper abdomen around the adrenal glands. This is of unknown etiology, progressed since prior study. Small bilateral pleural effusions, new since prior study. Stable small lower lobe pulmonary nodules. Bibasilar atelectasis. Splenomegaly, stable. Aortic atherosclerosis. Stable lytic lesions in the T7 and L2 vertebral bodies. Electronically Signed   By: Rolm Baptise M.D.   On: 05/25/2019 00:04      IMPRESSION/PLAN: 1. 73 y.o. gentleman with metastatic poorly differentiated adenocarcinoma with unknown primary with painful T7 and L2 metastases.  Today, I talked to the patient and family about the findings and work-up thus far.  We discussed the natural history of spinal metastases and general treatment, highlighting the role of radiotherapy in the management.  We discussed the available radiation techniques, and focused on the details of logistics and delivery.  We reviewed the anticipated acute and late sequelae associated with radiation in this setting.  The patient was encouraged to ask questions that I answered to the best of my ability. The patient would like to proceed with radiation and will be scheduled for CT simulation.  In a visit lasting 60 minutes, greater than 50% of that time was spent in floor time, discussing his/her case and coordinating his/her care.   Nicholos Johns, PA-C    Tyler Pita, MD  [facility] Direct Dial: [phone]   Fax: [phone] [email or web address]   Skype   [email or web address]   This document serves as a record of services personally performed by Tyler Pita, MD and Freeman Caldron, PA-C. It was created on their behalf by Wilburn Mylar, a trained medical scribe. The creation of this record is based on the scribe's personal observations and the provider's statements to them. This document has been checked and approved by the attending provider.

## 2019-05-27 DIAGNOSIS — N184 Chronic kidney disease, stage 4 (severe): Secondary | ICD-10-CM

## 2019-05-27 LAB — BASIC METABOLIC PANEL
Anion gap: 9 (ref 5–15)
BUN: 31 mg/dL — ABNORMAL HIGH (ref 8–23)
CO2: 30 mmol/L (ref 22–32)
Calcium: 8.9 mg/dL (ref 8.9–10.3)
Chloride: 91 mmol/L — ABNORMAL LOW (ref 98–111)
Creatinine, Ser: 1.34 mg/dL — ABNORMAL HIGH (ref 0.61–1.24)
GFR calc Af Amer: >60 mL/min (ref >60)
GFR calc non Af Amer: 52 mL/min — ABNORMAL LOW (ref >60)
Glucose, Bld: 129 mg/dL — ABNORMAL HIGH (ref 70–99)
Potassium: 4.4 mmol/L (ref 3.5–5.1)
Sodium: 130 mmol/L — ABNORMAL LOW (ref 135–145)

## 2019-05-27 LAB — GLUCOSE, CAPILLARY
Glucose-Capillary: 136 mg/dL — ABNORMAL HIGH (ref 70–99)
Glucose-Capillary: 149 mg/dL — ABNORMAL HIGH (ref 70–99)
Glucose-Capillary: 178 mg/dL — ABNORMAL HIGH (ref 70–99)
Glucose-Capillary: 161 mg/dL — ABNORMAL HIGH (ref 70–99)

## 2019-05-27 LAB — PROTEIN ELECTROPHORESIS, SERUM
A/G Ratio: 1.6 (ref 0.7–1.7)
Albumin ELP: 3.1 g/dL (ref 2.9–4.4)
Alpha-1-Globulin: 0.3 g/dL (ref 0.0–0.4)
Alpha-2-Globulin: 0.4 g/dL (ref 0.4–1.0)
Beta Globulin: 0.8 g/dL (ref 0.7–1.3)
Gamma Globulin: 0.5 g/dL (ref 0.4–1.8)
Globulin, Total: 2 g/dL — ABNORMAL LOW (ref 2.2–3.9)
Total Protein ELP: 5.1 g/dL — ABNORMAL LOW (ref 6.0–8.5)

## 2019-05-27 LAB — AFP TUMOR MARKER: AFP, Serum, Tumor Marker: 1.1 ng/mL (ref 0.0–8.3)

## 2019-05-27 LAB — BETA HCG QUANT (REF LAB): hCG Quant: 1 m[IU]/mL (ref 0–3)

## 2019-05-27 NOTE — Progress Notes (Signed)
[facility]                                                                              Patient Demographics  Shane Watson, is a 73 y.o. male, DOB - [birth year], MGN:[id number]  Admit date - 05/31/2019   Admitting Physician Jani Gravel, MD  Outpatient Primary MD for the patient is Shirline Frees, MD  Outpatient specialists:   LOS - 2  days   Medical records reviewed and are as summarized below:    Chief Complaint  Patient presents with   Back Pain       Brief summary   73 year old male with history of hypertension, chronic back pain/neuropathy, BPH, CKD stage III, COPD, CAD, diabetes mellitus, GERD, obstructive sleep apnea on CPAP evaluated by PCP few weeks back for back pain/abdominal pain, underwent outpatient work upwith CT scan of the abdomen pelvis on 10/12showing lower mediastinal and abdominal/pelvic lymphadenopathy  related to lymphoma or metastatic disease as well as tiny pulmonary nodules in the right lung base which were stable since the prior study->PET scan performed on 05/04/2019. PET scan results showed hypermetabolic mediastinal prevascular nodes and hypermetabolic retroperitoneal and mesenteric nodes consistent with lymphoma versus granulomatous disease (sarcoidosis),lytic lesion in the T7 vertebral body with moderate metabolic activity concerning for skeletal metastasis versus sarcoid lesion, small pulmonary nodules do not have associated metabolic activity-->patient underwent CT-guided retroperitoneal lymph node biopsy by IR on 10/22 and postprocedure complained of pain radiating to his groin-NSAIDs were recommended. Pathology results indicate undifferentiated adenocarcinoma --> He was referred for oncology evaluation and was scheduled to see Dr Irene Limbo on 11/10.  Patient had worsening back pain and presented to Urgent care on 11/2 and prescribed 5mg  Oxycodone-1 tab was not enough and 2 tabs were making him too drowsy. Patient was also seen by his  primary pulmonologist recently as outpatient for chest/abdominal pain complaints- felt to have subdiaphragmatic pain/bloating and dietary changes were advised. Last colonoscopy was 3 years back.  He presented to the [facility] ED on 11/5 with complaints of persistentbackpain radiating to left anterior abdomen.He also reports worsening lower extremity edema.   Assessment & Plan    Principal Problem: Intractable back pain, lytic bony lesions secondary to metastatic adenocarcinoma with unknown primary -Recent PSA WNL. 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  AFP CEA, hCG, PSA normal -Per oncology, Dr. Irene Limbo, this is incurable condition, options for treatment are all palliative.  Patient wants to discuss the management with oncology and his son in the discussion. -XRT treatments to begin on 11/9, radiation oncology recommending first 1 or 2 radiation treatments inpatient if possible and then can continue his XRT treatments outpatient Palliative medicine also consulted for symptom control and goals of care  Leg swelling likely due to hypoalbuminemia -CT also reported small bilateral pleural effusions, started on IV Lasix -BNP 13.7.  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  Continue incentive spirometry, diuretics -Uses CPAP/nocturnal O2  2 L at baseline  BPH Patient reports seeing a urologist in the past, mild hypertrophy on the CT pelvis, resume home meds  Essential hypertension Resumed home meds except losartan/HCTZ  GERD Continue famotidine PPI   Diabetes mellitus type 2 -CBGs fairly stable, continue sliding scale insulin, Tradjenta  Hyperlipidemia Continue Lipitor  Morbid obesity BMI 49.3, counseled on diet and weight control  Code Status: Full code DVT Prophylaxis:  Lovenox  Family Communication: Discussed all imaging results, lab results, explained to the patient   Disposition Plan: Pain control and PT over the weekend, start XRT on Monday.  Patient wants to have his son discuss the options with oncology.  Time Spent in minutes 35 minutes  Procedures:  None  Consultants:   Oncology Radiation oncology Palliative medicine  Antimicrobials:   Anti-infectives (From admission, onward)   Start     Dose/Rate Route Frequency Ordered Stop   05/25/19 0045  aztreonam (AZACTAM) 2 g in sodium chloride 0.9 % 100 mL IVPB  Status:  Discontinued     2 g 200 mL/hr over 30 Minutes Intravenous Every 8 hours 05/25/19 0035 05/25/19 0522          Medications  Scheduled Meds:  aspirin EC  81 mg Oral Daily   atorvastatin  40 mg Oral Daily   colesevelam  625 mg Oral BID WC   diltiazem  90 mg Oral Daily   doxazosin  8 mg Oral QHS   enoxaparin (LOVENOX) injection  60 mg Subcutaneous Q24H   famotidine  20 mg Oral QHS   furosemide  20 mg Intravenous Daily   gabapentin  300 mg Oral BID   insulin aspart  0-15 Units Subcutaneous TID WC   insulin aspart  0-5 Units Subcutaneous QHS   lidocaine  3 patch Transdermal Q24H   linagliptin  5 mg Oral Daily   loratadine  10 mg Oral Daily   magnesium oxide  400 mg Oral Daily   meclizine  25 mg Oral Daily   mometasone-formoterol  2 puff Inhalation BID   pantoprazole  40 mg Oral Daily   polyethylene glycol  17 g Oral Daily   Continuous  Infusions: PRN Meds:.acetaminophen **OR** acetaminophen, albuterol, diltiazem, diphenoxylate-atropine, hydrALAZINE, HYDROmorphone (DILAUDID) injection, ondansetron (ZOFRAN) IV, oxybutynin, polyvinyl alcohol      Subjective:   Shane Watson was seen and examined today.  No acute events overnight, no fevers or chills.  Pain 7/10, no FND's. Patient denies dizziness, chest pain, shortness of breath, abdominal pain, N/V/D/C.   Objective:   Vitals:   05/27/19 0442 05/27/19 0500 05/27/19 0806 05/27/19 1401  BP: (!) 159/69   139/63  Pulse: 79   92  Resp: 17   19  Temp: 97.8 F (36.6 C)   97.7 F (36.5 C)  TempSrc: Oral     SpO2: 98%  95% 95%  Weight:  (!) 160.5 kg    Height:        Intake/Output Summary (Last 24 hours) at 05/27/2019 1502 Last data filed at 05/27/2019 0901 Gross per 24 hour  Intake 620 ml  Output 300 ml  Net 320 ml     Wt Readings from  Last 3 Encounters:  05/27/19 (!) 160.5 kg  05/15/19 (!) 157.9 kg  05/11/19 (!) 154.2 kg     Exam  General: Alert and oriented x 3, NAD  Eyes:   HEENT:  Atraumatic, normocephalic  Cardiovascular: S1 S2 auscultated, no murmurs, RRR  Respiratory: Clear to auscultation bilaterally, no wheezing, rales or rhonchi  Gastrointestinal: Soft, nontender, nondistended, + bowel sounds  Ext: 2+ pedal edema bilaterally  Neuro: No new deficit  Musculoskeletal: No digital cyanosis, clubbing  Skin: No rashes  Psych: Normal affect and demeanor, alert and oriented x3    Data Reviewed:  I have personally reviewed following labs and imaging studies  Micro Results Recent Results (from the past 240 hour(s))  SARS CORONAVIRUS 2 (TAT 6-24 HRS) Nasopharyngeal Nasopharyngeal Swab     Status: None   Collection Time: 05/25/19  1:09 AM   Specimen: Nasopharyngeal Swab  Result Value Ref Range Status   SARS Coronavirus 2 NEGATIVE NEGATIVE Final    Comment: (NOTE) SARS-CoV-2 target nucleic acids are NOT DETECTED. The SARS-CoV-2 RNA is  generally detectable in upper and lower respiratory specimens during the acute phase of infection. Negative results do not preclude SARS-CoV-2 infection, do not rule out co-infections with other pathogens, and should not be used as the sole basis for treatment or other patient management decisions. Negative results must be combined with clinical observations, patient history, and epidemiological information. The expected result is Negative. Fact Sheet for Patients: SugarRoll.be Fact Sheet for Healthcare Providers: https://www.woods-mathews.com/ This test is not yet approved or cleared by the [place] FDA and  has been authorized for detection and/or diagnosis of SARS-CoV-2 by FDA under an Emergency Use Authorization (EUA). This EUA will remain  in effect (meaning this test can be used) for the duration of the COVID-19 declaration under Section 56 4(b)(1) of the Act, 21 U.S.C. section 360bbb-3(b)(1), unless the authorization is terminated or revoked sooner. Performed at [facility], [place] [address] [place], [place] [address]     Radiology Reports Ct Abdomen Pelvis W Contrast  Result Date: 05/01/2019 CLINICAL DATA:  2 week history of right upper quadrant pain. EXAM: CT ABDOMEN AND PELVIS WITH CONTRAST TECHNIQUE: Multidetector CT imaging of the abdomen and pelvis was performed using the standard protocol following bolus administration of intravenous contrast. CONTRAST:  170mL ISOVUE-300 IOPAMIDOL (ISOVUE-300) INJECTION 61% COMPARISON:  01/17/2018 FINDINGS: Lower chest: Tiny pulmonary nodules in the posterior right lung base measuring 6 mm on images 1 and 8 of series 3 are stable in the interval, likely benign. Atelectasis or scarring is noted in the lung bases bilaterally. Hepatobiliary: No suspicious focal abnormality within the liver parenchyma. Gallbladder surgically absent. No intrahepatic or extrahepatic biliary dilation. Pancreas: No  focal mass lesion. No dilatation of the main duct. No intraparenchymal cyst. No peripancreatic edema. Spleen: No splenomegaly. No focal mass lesion. Adrenals/Urinary Tract: No adrenal nodule or mass. Right kidney unremarkable. Similar appearance of a 6.5 cm interpolar left renal cyst. Smaller low-density 14 mm interpolar left renal lesion is also stable. No evidence for hydronephrosis. No evidence for hydroureter. Bladder is decompressed. Stomach/Bowel: Stomach is unremarkable. No gastric wall thickening. No evidence of outlet obstruction. Duodenum is normally positioned as is the ligament of Treitz. No small bowel wall thickening. No small bowel dilatation. The terminal ileum is normal. The appendix is not visualized, but there is no edema or inflammation in the region of the cecum. No gross colonic mass. No colonic wall thickening. Vascular/Lymphatic: There is abdominal aortic atherosclerosis without  aneurysm. Interval development of lymphadenopathy in the lower mediastinum and abdomen. 13 mm right retrocrural node seen on 12/02. 2.4 cm short axis portal caval node visible on 30/2. 2.3 cm short axis aortocaval node visible on 38/2. 2.8 cm left para-aortic node identified on 50/2. Small lymph nodes are seen in the common iliac chains bilaterally. 15 mm short axis left common femoral node visible on 77/2. Reproductive: Prostate gland is enlarged. Other: No intraperitoneal free fluid. Musculoskeletal: Right groin hernia contains only fat. Multiple new subtle lucent lesions are identified in the bony anatomy including a 13 mm lesion in the L1 vertebral body (32/2), 1.9 cm lesion in the L2 vertebral body (40/2), subtle cortical lesion in the right iliac crest (59/2) and 10 mm sacral lesion on 69/2. IMPRESSION: 1. Lower mediastinal and abdominal/pelvic lymphadenopathy. This could be related to lymphoma or metastatic disease, but given the interval development of multiple new lucent bone lesions, metastatic disease is  favored. No evidence for a primary lesion on the current exam. 2.  Aortic Atherosclerois (ICD10-170.0) 3. Tiny pulmonary nodules in the right lung base are stable since prior study, suggesting benign etiology. These results will be called to the ordering clinician or representative by the Radiologist Assistant, and communication documented in the PACS or zVision Dashboard. Electronically Signed   By: Misty Stanley M.D.   On: 05/01/2019 17:26   Nm Pet Image Initial (pi) Skull Base To Thigh  Result Date: 05/04/2019 CLINICAL DATA:  Initial staging, abdominal lymphadenopathy treatment strategy for abdominal lymphadenopathy. EXAM: NUCLEAR MEDICINE PET SKULL BASE TO THIGH TECHNIQUE: Sixteen mCi F-18 FDG was injected intravenously. Full-ring PET imaging was performed from the skull base to thigh after the radiotracer. CT data was obtained and used for attenuation correction and anatomic localization. Fasting blood glucose: 113 mg/dl COMPARISON:  None. FINDINGS: Mediastinal blood pool activity: SUV max 1.8 Liver activity: SUV max NA NECK: No hypermetabolic lymph nodes in the neck. Incidental CT findings: none CHEST: Cluster prevascular lymph nodes. Example lymph node measures 0.6 cm prevascular space with SUV max 10.7. Nodes extend to the high LEFT paratracheal nodal station but no clear supraclavicular hypermetabolic nodes. No hypermetabolic axillary nodes Incidental CT findings: Small RIGHT pleural effusion. Mild RIGHT basilar atelectasis. Nodule along the RIGHT pleural surface measures 8 mm (image 78/2 without metabolic activity. Second nodule along the posterior aspect of the LEFT upper lobe against the fissure measures 6 mm (image 57/4 also without metabolic activity. ABDOMEN/PELVIS: Cluster hypermetabolic lymph nodes in the retroperitoneum along the aorta from the renal veins to the iliac arteries. Example LEFT aorta SUV max equal 6.2 measures 2 cm (image 143/4. Posterior the aorta SUV max equal 7.6. Noted within  the mesentery on the SMA adjacent to the uncinate of the pancreas measures 2.2 cm SUV max 10.6. Smaller hypermetabolic nodes extend along the common iliac arteries. No hypermetabolic external iliac nodes or inguinal nodes. Spleen is normal metabolic activity normal volume. No abnormal activity in liver. Incidental CT findings: There is a lesion in the SKELETON: Along the posterior aspect of the T7 vertebral body there is a lytic lesion measuring 2 cm (image 80/4) which has mild metabolic activity (SUV max equal 6.0.) This lesion is new from CT September 22, 4233) Focal metabolic activity in the posterior LEFT iliac bone with SUV max equal 6.9. No clear CT abnormality at this level. Lytic lesion in the L1 vertebral body with mild metabolic activity (SUV max equal 5.7. Incidental CT findings: none IMPRESSION: 1. Hypermetabolic mediastinal prevascular  nodes and hypermetabolic retroperitoneal and mesenteric nodes consistent with lymphoma versus granulomatous disease (sarcoidosis). 2. Lytic lesion in the T7 vertebral body with moderate metabolic activity is concerning for skeletal metastasis versus sarcoid lesion. 3. Small pulmonary nodules do not have associated metabolic activity. Recommend attention on follow-up. 4. Unfortunately, there are no large hypermetabolic supraclavicular or inguinal/iliac nodes for biopsy. The hypermetabolic T7 lesion is represented well on CT. The hypermetabolic LEFT iliac bone lesion is not well depicted on CT. Electronically Signed   By: Suzy Bouchard M.D.   On: 05/04/2019 13:06   Ct Biopsy  Result Date: 05/11/2019 INDICATION: Retroperitoneal adenopathy, concern for lymphoproliferative disorder EXAM: CT-GUIDED BIOPSY LEFT RETROPERITONEAL BULKY ADENOPATHY MEDICATIONS: 1% lidocaine local ANESTHESIA/SEDATION: 0.5 mg IV Versed; 12.5 mcg IV Fentanyl Moderate Sedation Time:  13 minutes The patient was continuously monitored during the procedure by the interventional radiology nurse under my  direct supervision. PROCEDURE: The procedure, risks, benefits, and alternatives were explained to the patient. Questions regarding the procedure were encouraged and answered. The patient understands and consents to the procedure. Previous imaging reviewed. Patient positioned prone. Noncontrast localization CT performed. The left bulky retroperitoneal periaortic adenopathy was localized and marked. Under sterile conditions and local anesthesia, a 17 gauge 16.8 cm guide was advanced to the adenopathy. Needle position confirmed with CT. 18 gauge core biopsies obtained. Samples placed in saline. Needle removed. Postprocedure imaging demonstrates no hemorrhage or hematoma. Patient tolerated the procedure well without complication. Vital sign monitoring by nursing staff during the procedure will continue as patient is in the special procedures unit for post procedure observation. FINDINGS: The images document guide needle placement within the left retroperitoneal periaortic adenopathy. Post biopsy images demonstrate no hemorrhage or hematoma. COMPLICATIONS: None immediate. IMPRESSION: Successful CT-guided core biopsy of the left retroperitoneal periaortic adenopathy Electronically Signed   By: [name].  Shick M.D.   On: 05/11/2019 14:24   Ct Renal Stone Study  Result Date: 05/25/2019 CLINICAL DATA:  Low back pain, generalized abdominal pain EXAM: CT ABDOMEN AND PELVIS WITHOUT CONTRAST TECHNIQUE: Multidetector CT imaging of the abdomen and pelvis was performed following the standard protocol without IV contrast. COMPARISON:  08/31/2018 FINDINGS: Lower chest: small bilateral pleural effusions, new since prior study. Small nodules in the right lower lobe are stable. Bibasilar atelectasis. Heart is normal size. Mediastinal adenopathy partially imaged on the upper images as seen on prior study. Heart is normal size. Hepatobiliary: No focal liver abnormality is seen. Status post cholecystectomy. No biliary dilatation. Pancreas:  No focal abnormality or ductal dilatation. Spleen: Spleen mildly enlarged with a craniocaudal length of 13.9 cm. This is stable when compared to prior study. No focal splenic abnormality. Adrenals/Urinary Tract: Left renal cyst measures 6 cm. No hydronephrosis. Adrenal glands and urinary bladder unremarkable. Stomach/Bowel: Appendix normal. Stomach, large and small bowel grossly unremarkable. Vascular/Lymphatic: Aortic atherosclerosis. No aneurysm. Extensive retroperitoneal adenopathy. This continues into the pelvis/iliac chains. This is also noted in the retrocrural region. This is unchanged since prior study. Reproductive: Mild prostate prominence. Other: No free fluid or free air. There is extensive stranding/soft tissue density noted in the upper retroperitoneum around the adrenal glands. This has progressed significantly since prior study and is of unknown etiology. Musculoskeletal: Lytic lesion within the T7 vertebral body is stable. Lytic lesion within the L2 vertebral body also stable. IMPRESSION: Extensive retroperitoneal adenopathy in the abdomen and pelvis. Adenopathy in the visualized lower mediastinum. This is unchanged since prior study and could reflect lymphoma or metastatic disease. Extensive stranding in the retroperitoneum  in the upper abdomen around the adrenal glands. This is of unknown etiology, progressed since prior study. Small bilateral pleural effusions, new since prior study. Stable small lower lobe pulmonary nodules. Bibasilar atelectasis. Splenomegaly, stable. Aortic atherosclerosis. Stable lytic lesions in the T7 and L2 vertebral bodies. Electronically Signed   By: Rolm Baptise M.D.   On: 05/25/2019 00:04    Lab Data:  CBC: Recent Labs  Lab 05/28/2019 2057 05/25/19 0109 05/26/19 0327  WBC 6.9 7.8 6.2  HGB 11.9* 12.0* 11.6*  HCT 38.3* 38.4* 37.8*  MCV 91.4 92.1 91.7  PLT 150 153 161   Basic Metabolic Panel: Recent Labs  Lab 06/10/2019 2057 05/25/19 0109 05/26/19 0327  05/27/19 0409  NA 131* 131* 130* 130*  K 4.5 4.4 4.2 4.4  CL 92* 93* 93* 91*  CO2 32 30 27 30   GLUCOSE 121* 129* 122* 129*  BUN 30* 35* 29* 31*  CREATININE 2.18* 2.19* 1.49* 1.34*  CALCIUM 8.9 8.9 8.7* 8.9   GFR: Estimated Creatinine Clearance: 74.8 mL/min (A) (by C-G formula based on SCr of 1.34 mg/dL (H)). Liver Function Tests: Recent Labs  Lab 06/04/2019 2057 05/25/19 0109  AST 32 32  ALT 19 19  ALKPHOS 114 109  BILITOT 1.1 1.5*  PROT 5.6* 5.6*  ALBUMIN 3.1* 3.3*   Recent Labs  Lab 06/08/2019 2057  LIPASE 55*   No results for input(s): AMMONIA in the last 168 hours. Coagulation Profile: No results for input(s): INR, PROTIME in the last 168 hours. Cardiac Enzymes: No results for input(s): CKTOTAL, CKMB, CKMBINDEX, TROPONINI in the last 168 hours. BNP (last 3 results) No results for input(s): PROBNP in the last 8760 hours. HbA1C: Recent Labs    05/26/19 1112  HGBA1C 5.4   CBG: Recent Labs  Lab 05/26/19 1134 05/26/19 1641 05/26/19 2052 05/27/19 0752 05/27/19 1210  GLUCAP 137* 139* 164* 136* 149*   Lipid Profile: No results for input(s): CHOL, HDL, LDLCALC, TRIG, CHOLHDL, LDLDIRECT in the last 72 hours. Thyroid Function Tests: No results for input(s): TSH, T4TOTAL, FREET4, T3FREE, THYROIDAB in the last 72 hours. Anemia Panel: No results for input(s): VITAMINB12, FOLATE, FERRITIN, TIBC, IRON, RETICCTPCT in the last 72 hours. Urine analysis:    Component Value Date/Time   COLORURINE YELLOW 06/15/2019 2212   APPEARANCEUR HAZY (A) 06/05/2019 2212   LABSPEC 1.017 06/18/2019 2212   PHURINE 5.0 06/15/2019 2212   GLUCOSEU NEGATIVE 06/07/2019 2212   HGBUR NEGATIVE 06/17/2019 2212   BILIRUBINUR NEGATIVE 06/05/2019 2212   KETONESUR NEGATIVE 06/09/2019 2212   PROTEINUR NEGATIVE 06/05/2019 2212   UROBILINOGEN 1.0 02/20/2014 1809   NITRITE NEGATIVE 05/23/2019 2212   LEUKOCYTESUR NEGATIVE 06/05/2019 2212     Elgin Carn M.D. [facility] 05/27/2019,  3:02 PM  Pager: [phone] Between 7am to 7pm - call Pager - [phone]  After 7pm go to [email or web address] - password [email or web address]  Call night coverage person covering after 7pm

## 2019-05-28 ENCOUNTER — Other Ambulatory Visit: Payer: Self-pay | Admitting: Cardiology

## 2019-05-28 ENCOUNTER — Other Ambulatory Visit: Payer: Self-pay | Admitting: Internal Medicine

## 2019-05-28 DIAGNOSIS — Z7189 Other specified counseling: Secondary | ICD-10-CM

## 2019-05-28 DIAGNOSIS — Z515 Encounter for palliative care: Secondary | ICD-10-CM

## 2019-05-28 LAB — BASIC METABOLIC PANEL
Anion gap: 9 (ref 5–15)
BUN: 34 mg/dL — ABNORMAL HIGH (ref 8–23)
CO2: 30 mmol/L (ref 22–32)
Calcium: 9 mg/dL (ref 8.9–10.3)
Chloride: 90 mmol/L — ABNORMAL LOW (ref 98–111)
Creatinine, Ser: 1.52 mg/dL — ABNORMAL HIGH (ref 0.61–1.24)
GFR calc Af Amer: 52 mL/min — ABNORMAL LOW (ref >60)
GFR calc non Af Amer: 44 mL/min — ABNORMAL LOW (ref >60)
Glucose, Bld: 129 mg/dL — ABNORMAL HIGH (ref 70–99)
Potassium: 4.7 mmol/L (ref 3.5–5.1)
Sodium: 129 mmol/L — ABNORMAL LOW (ref 135–145)

## 2019-05-28 LAB — GLUCOSE, CAPILLARY
Glucose-Capillary: 121 mg/dL — ABNORMAL HIGH (ref 70–99)
Glucose-Capillary: 160 mg/dL — ABNORMAL HIGH (ref 70–99)
Glucose-Capillary: 122 mg/dL — ABNORMAL HIGH (ref 70–99)
Glucose-Capillary: 153 mg/dL — ABNORMAL HIGH (ref 70–99)

## 2019-05-28 LAB — SODIUM, URINE, RANDOM: Sodium, Ur: <10 mmol/L

## 2019-05-28 LAB — OSMOLALITY: Osmolality: 273 mOsm/kg — ABNORMAL LOW (ref 275–295)

## 2019-05-28 LAB — OSMOLALITY, URINE: Osmolality, Ur: 459 mOsm/kg (ref 300–900)

## 2019-05-28 MED ORDER — OXYCODONE HCL ER 10 MG PO T12A
10.0000 mg | EXTENDED_RELEASE_TABLET | Freq: Two times a day (BID) | ORAL | Status: DC
  Administered 2019-05-28 – 2019-05-29 (×2): 10 mg via ORAL
  Filled 2019-05-28 (×2): qty 1

## 2019-05-28 NOTE — Progress Notes (Signed)
Pt c/o urge to urinate but unable. Bladder scan performed and showed 6ml in the bladder. Patient has voided 285ml of urine since 7a. Will continue to monitor output.   Aariv Medlock, [name] 05/28/2019 5:21 PM

## 2019-05-28 NOTE — Progress Notes (Signed)
  Radiation Oncology         [phone] ________________________________  Name: Shane Watson MRN: [id number]  Date: 05/26/2019  DOB: October 31, 1945  SIMULATION AND TREATMENT PLANNING NOTE    ICD-10-CM   1. Bone metastases ([place])  C79.51     DIAGNOSIS:  73 y.o. patient with T7 and L2 lumbar metastasis  NARRATIVE:  The patient was brought to the [facility].  Identity was confirmed.  All relevant records and images related to the planned course of therapy were reviewed.  The patient freely provided informed written consent to proceed with treatment after reviewing the details related to the planned course of therapy. The consent form was witnessed and verified by the simulation staff.  Then, the patient was set-up in a stable reproducible  supine position for radiation therapy.  CT images were obtained.  Surface markings were placed.  The CT images were loaded into the planning software.  Then the target and avoidance structures were contoured including kidneys.  Treatment planning then occurred.  The radiation prescription was entered and confirmed.  Then, I designed and supervised the construction of a total of 5 medically necessary complex treatment devices with VacLoc positioner and 2 MLCs to shield kidneys.  I have requested : 3D Simulation  I have requested a DVH of the following structures: Left Kidney, Right Kidney and target.  PLAN:  The patient will receive 30 Gy in 10 fractions.  ________________________________  Sheral Apley Tammi Klippel, M.D.

## 2019-05-28 NOTE — Progress Notes (Addendum)
[facility]                                                                              Patient Demographics  Shane Watson, is a 73 y.o. male, DOB - 30-Jun-1946, SEG:[id number]  Admit date - 06/09/2019   Admitting Physician Jani Gravel, MD  Outpatient Primary MD for the patient is Shirline Frees, MD  Outpatient specialists:   LOS - 3  days   Medical records reviewed and are as summarized below:    Chief Complaint  Patient presents with   Back Pain       Brief summary   73 year old male with history of hypertension, chronic back pain/neuropathy, BPH, CKD stage III, COPD, CAD, diabetes mellitus, GERD, obstructive sleep apnea on CPAP evaluated by PCP few weeks back for back pain/abdominal pain, underwent outpatient work upwith CT scan of the abdomen pelvis on 10/12showing lower mediastinal and abdominal/pelvic lymphadenopathy  related to lymphoma or metastatic disease as well as tiny pulmonary nodules in the right lung base which were stable since the prior study->PET scan performed on 05/04/2019. PET scan results showed hypermetabolic mediastinal prevascular nodes and hypermetabolic retroperitoneal and mesenteric nodes consistent with lymphoma versus granulomatous disease (sarcoidosis),lytic lesion in the T7 vertebral body with moderate metabolic activity concerning for skeletal metastasis versus sarcoid lesion, small pulmonary nodules do not have associated metabolic activity-->patient underwent CT-guided retroperitoneal lymph node biopsy by IR on 10/22 and postprocedure complained of pain radiating to his groin-NSAIDs were recommended. Pathology results indicate undifferentiated adenocarcinoma --> He was referred for oncology evaluation and was scheduled to see Dr Irene Limbo on 11/10.  Patient had worsening back pain and presented to Urgent care on 11/2 and prescribed 5mg  Oxycodone-1 tab was not enough and 2 tabs were making him too drowsy. Patient was also seen by his  primary pulmonologist recently as outpatient for chest/abdominal pain complaints- felt to have subdiaphragmatic pain/bloating and dietary changes were advised. Last colonoscopy was 3 years back.  He presented to the [facility] ED on 11/5 with complaints of persistentbackpain radiating to left anterior abdomen.He also reports worsening lower extremity edema.   Assessment & Plan    Principal Problem: Intractable back pain, lytic bony lesions secondary to metastatic adenocarcinoma with unknown primary -Recent PSA WNL. 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  AFP CEA, hCG, PSA normal -Per oncology, Dr. Irene Limbo, this is incurable condition, options for treatment are all palliative.  Patient wants to discuss the management with oncology and his son in the discussion. -XRT treatments to begin on 11/9, radiation oncology recommending first 1 or 2 radiation treatments inpatient if possible and then can continue his XRT treatments outpatient -Awaiting palliative medicine recommendations regarding pain control and goals of care.  Leg swelling likely due to hypoalbuminemia -CT also reported small bilateral pleural effusions -BNP 13.7.  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  Continue incentive spirometry, diuretics -Uses CPAP/nocturnal O2 2 L  at baseline  BPH Patient reports seeing a urologist in the past, mild hypertrophy on the CT pelvis, resume home meds  Essential hypertension Resumed home meds except losartan/HCTZ  GERD Continue famotidine PPI   Diabetes mellitus type 2 -CBGs stable, continue sliding scale insulin, Tradjenta   Hyperlipidemia Continue Lipitor  Morbid obesity BMI 49.3, counseled on diet and weight control   hyponatremia -Hold off on the Lasix, obtain serum osmolality, urine osmolality, urine  Code Status: Full code DVT Prophylaxis:  Lovenox  Family Communication: Discussed all imaging results, lab results, explained to the patient   Disposition Plan: Pain control and PT over the weekend, start XRT on Monday.  Patient wants to have his son discuss the options with oncology.  Time Spent in minutes 25 minutes  Procedures:  None  Consultants:   Oncology Radiation oncology Palliative medicine  Antimicrobials:   Anti-infectives (From admission, onward)   Start     Dose/Rate Route Frequency Ordered Stop   05/25/19 0045  aztreonam (AZACTAM) 2 g in sodium chloride 0.9 % 100 mL IVPB  Status:  Discontinued     2 g 200 mL/hr over 30 Minutes Intravenous Every 8 hours 05/25/19 0035 05/25/19 0522         Medications  Scheduled Meds:  aspirin EC  81 mg Oral Daily   atorvastatin  40 mg Oral Daily   colesevelam  625 mg Oral BID WC   diltiazem  90 mg Oral Daily   doxazosin  8 mg Oral QHS   enoxaparin (LOVENOX) injection  60 mg Subcutaneous Q24H   famotidine  20 mg Oral QHS   furosemide  20 mg Intravenous Daily   gabapentin  300 mg Oral BID   insulin aspart  0-15 Units Subcutaneous TID WC   insulin aspart  0-5 Units Subcutaneous QHS   lidocaine  3 patch Transdermal Q24H   linagliptin  5 mg Oral Daily   loratadine  10 mg Oral Daily   magnesium oxide  400 mg Oral Daily   meclizine  25 mg Oral Daily   mometasone-formoterol  2 puff Inhalation BID   pantoprazole  40  mg Oral Daily   polyethylene glycol  17 g Oral Daily   Continuous Infusions: PRN Meds:.acetaminophen **OR** acetaminophen, albuterol, diltiazem, diphenoxylate-atropine, hydrALAZINE, HYDROmorphone (DILAUDID) injection, ondansetron (ZOFRAN) IV, oxybutynin, polyvinyl alcohol      Subjective:   Shane Watson was seen and examined today.  No acute events overnight.  No fevers or chills.  CPAP on at the time of my encounter.  Patient appeared comfortable.   Patient denies dizziness, chest pain, shortness of breath, abdominal pain, N/V/D/C.   Objective:   Vitals:   05/27/19 1946 05/27/19 2218 05/28/19 0500 05/28/19 0514  BP:  (!) 153/60  (!) 127/54  Pulse:  76  83  Resp:  16  18  Temp:  98.1 F (36.7 C)  97.7 F (36.5 C)  TempSrc:  Oral  Oral  SpO2: 96% 94%  94%  Weight:   (!) 161.3 kg   Height:        Intake/Output Summary (Last 24 hours) at 05/28/2019 1332 Last data filed at 05/28/2019 0855 Gross per  24 hour  Intake 840 ml  Output 575 ml  Net 265 ml     Wt Readings from Last 3 Encounters:  05/28/19 (!) 161.3 kg  05/15/19 (!) 157.9 kg  05/11/19 (!) 154.2 kg   Physical Exam  General: Alert and oriented x 3, NAD  Eyes:   HEENT:  Atraumatic, normocephalic  Cardiovascular: S1 S2 clear, RRR. 1+ pedal edema b/l  Respiratory: CTAB, no wheezing, rales or rhonchi  Gastrointestinal: Soft, nontender, nondistended, NBS  Ext: 1+ pedal edema bilaterally  Neuro: no new deficits  Musculoskeletal: No cyanosis, clubbing  Skin: No rashes  Psych: Normal affect and demeanor, alert and oriented x3      Data Reviewed:  I have personally reviewed following labs and imaging studies  Micro Results Recent Results (from the past 240 hour(s))  SARS CORONAVIRUS 2 (TAT 6-24 HRS) Nasopharyngeal Nasopharyngeal Swab     Status: None   Collection Time: 05/25/19  1:09 AM   Specimen: Nasopharyngeal Swab  Result Value Ref Range Status   SARS Coronavirus 2 NEGATIVE NEGATIVE Final     Comment: (NOTE) SARS-CoV-2 target nucleic acids are NOT DETECTED. The SARS-CoV-2 RNA is generally detectable in upper and lower respiratory specimens during the acute phase of infection. Negative results do not preclude SARS-CoV-2 infection, do not rule out co-infections with other pathogens, and should not be used as the sole basis for treatment or other patient management decisions. Negative results must be combined with clinical observations, patient history, and epidemiological information. The expected result is Negative. Fact Sheet for Patients: SugarRoll.be Fact Sheet for Healthcare Providers: https://www.woods-mathews.com/ This test is not yet approved or cleared by the [place] FDA and  has been authorized for detection and/or diagnosis of SARS-CoV-2 by FDA under an Emergency Use Authorization (EUA). This EUA will remain  in effect (meaning this test can be used) for the duration of the COVID-19 declaration under Section 56 4(b)(1) of the Act, 21 U.S.C. section 360bbb-3(b)(1), unless the authorization is terminated or revoked sooner. Performed at [facility], [place] [address] [place], [place] [address]     Radiology Reports Ct Abdomen Pelvis W Contrast  Result Date: 05/01/2019 CLINICAL DATA:  2 week history of right upper quadrant pain. EXAM: CT ABDOMEN AND PELVIS WITH CONTRAST TECHNIQUE: Multidetector CT imaging of the abdomen and pelvis was performed using the standard protocol following bolus administration of intravenous contrast. CONTRAST:  157mL ISOVUE-300 IOPAMIDOL (ISOVUE-300) INJECTION 61% COMPARISON:  01/17/2018 FINDINGS: Lower chest: Tiny pulmonary nodules in the posterior right lung base measuring 6 mm on images 1 and 8 of series 3 are stable in the interval, likely benign. Atelectasis or scarring is noted in the lung bases bilaterally. Hepatobiliary: No suspicious focal abnormality within the liver parenchyma.  Gallbladder surgically absent. No intrahepatic or extrahepatic biliary dilation. Pancreas: No focal mass lesion. No dilatation of the main duct. No intraparenchymal cyst. No peripancreatic edema. Spleen: No splenomegaly. No focal mass lesion. Adrenals/Urinary Tract: No adrenal nodule or mass. Right kidney unremarkable. Similar appearance of a 6.5 cm interpolar left renal cyst. Smaller low-density 14 mm interpolar left renal lesion is also stable. No evidence for hydronephrosis. No evidence for hydroureter. Bladder is decompressed. Stomach/Bowel: Stomach is unremarkable. No gastric wall thickening. No evidence of outlet obstruction. Duodenum is normally positioned as is the ligament of Treitz. No small bowel wall thickening. No small bowel dilatation. The terminal ileum is normal. The appendix is not visualized, but there is no edema or inflammation in the region  of the cecum. No gross colonic mass. No colonic wall thickening. Vascular/Lymphatic: There is abdominal aortic atherosclerosis without aneurysm. Interval development of lymphadenopathy in the lower mediastinum and abdomen. 13 mm right retrocrural node seen on 12/02. 2.4 cm short axis portal caval node visible on 30/2. 2.3 cm short axis aortocaval node visible on 38/2. 2.8 cm left para-aortic node identified on 50/2. Small lymph nodes are seen in the common iliac chains bilaterally. 15 mm short axis left common femoral node visible on 77/2. Reproductive: Prostate gland is enlarged. Other: No intraperitoneal free fluid. Musculoskeletal: Right groin hernia contains only fat. Multiple new subtle lucent lesions are identified in the bony anatomy including a 13 mm lesion in the L1 vertebral body (32/2), 1.9 cm lesion in the L2 vertebral body (40/2), subtle cortical lesion in the right iliac crest (59/2) and 10 mm sacral lesion on 69/2. IMPRESSION: 1. Lower mediastinal and abdominal/pelvic lymphadenopathy. This could be related to lymphoma or metastatic disease,  but given the interval development of multiple new lucent bone lesions, metastatic disease is favored. No evidence for a primary lesion on the current exam. 2.  Aortic Atherosclerois (ICD10-170.0) 3. Tiny pulmonary nodules in the right lung base are stable since prior study, suggesting benign etiology. These results will be called to the ordering clinician or representative by the Radiologist Assistant, and communication documented in the PACS or zVision Dashboard. Electronically Signed   By: Misty Stanley M.D.   On: 05/01/2019 17:26   Nm Pet Image Initial (pi) Skull Base To Thigh  Result Date: 05/04/2019 CLINICAL DATA:  Initial staging, abdominal lymphadenopathy treatment strategy for abdominal lymphadenopathy. EXAM: NUCLEAR MEDICINE PET SKULL BASE TO THIGH TECHNIQUE: Sixteen mCi F-18 FDG was injected intravenously. Full-ring PET imaging was performed from the skull base to thigh after the radiotracer. CT data was obtained and used for attenuation correction and anatomic localization. Fasting blood glucose: 113 mg/dl COMPARISON:  None. FINDINGS: Mediastinal blood pool activity: SUV max 1.8 Liver activity: SUV max NA NECK: No hypermetabolic lymph nodes in the neck. Incidental CT findings: none CHEST: Cluster prevascular lymph nodes. Example lymph node measures 0.6 cm prevascular space with SUV max 10.7. Nodes extend to the high LEFT paratracheal nodal station but no clear supraclavicular hypermetabolic nodes. No hypermetabolic axillary nodes Incidental CT findings: Small RIGHT pleural effusion. Mild RIGHT basilar atelectasis. Nodule along the RIGHT pleural surface measures 8 mm (image 26/7 without metabolic activity. Second nodule along the posterior aspect of the LEFT upper lobe against the fissure measures 6 mm (image 57/4 also without metabolic activity. ABDOMEN/PELVIS: Cluster hypermetabolic lymph nodes in the retroperitoneum along the aorta from the renal veins to the iliac arteries. Example LEFT aorta SUV  max equal 6.2 measures 2 cm (image 143/4. Posterior the aorta SUV max equal 7.6. Noted within the mesentery on the SMA adjacent to the uncinate of the pancreas measures 2.2 cm SUV max 10.6. Smaller hypermetabolic nodes extend along the common iliac arteries. No hypermetabolic external iliac nodes or inguinal nodes. Spleen is normal metabolic activity normal volume. No abnormal activity in liver. Incidental CT findings: There is a lesion in the SKELETON: Along the posterior aspect of the T7 vertebral body there is a lytic lesion measuring 2 cm (image 80/4) which has mild metabolic activity (SUV max equal 6.0.) This lesion is new from CT September 23, 1243) Focal metabolic activity in the posterior LEFT iliac bone with SUV max equal 6.9. No clear CT abnormality at this level. Lytic lesion in the L1 vertebral  body with mild metabolic activity (SUV max equal 5.7. Incidental CT findings: none IMPRESSION: 1. Hypermetabolic mediastinal prevascular nodes and hypermetabolic retroperitoneal and mesenteric nodes consistent with lymphoma versus granulomatous disease (sarcoidosis). 2. Lytic lesion in the T7 vertebral body with moderate metabolic activity is concerning for skeletal metastasis versus sarcoid lesion. 3. Small pulmonary nodules do not have associated metabolic activity. Recommend attention on follow-up. 4. Unfortunately, there are no large hypermetabolic supraclavicular or inguinal/iliac nodes for biopsy. The hypermetabolic T7 lesion is represented well on CT. The hypermetabolic LEFT iliac bone lesion is not well depicted on CT. Electronically Signed   By: Suzy Bouchard M.D.   On: 05/04/2019 13:06   Ct Biopsy  Result Date: 05/11/2019 INDICATION: Retroperitoneal adenopathy, concern for lymphoproliferative disorder EXAM: CT-GUIDED BIOPSY LEFT RETROPERITONEAL BULKY ADENOPATHY MEDICATIONS: 1% lidocaine local ANESTHESIA/SEDATION: 0.5 mg IV Versed; 12.5 mcg IV Fentanyl Moderate Sedation Time:  13 minutes The patient  was continuously monitored during the procedure by the interventional radiology nurse under my direct supervision. PROCEDURE: The procedure, risks, benefits, and alternatives were explained to the patient. Questions regarding the procedure were encouraged and answered. The patient understands and consents to the procedure. Previous imaging reviewed. Patient positioned prone. Noncontrast localization CT performed. The left bulky retroperitoneal periaortic adenopathy was localized and marked. Under sterile conditions and local anesthesia, a 17 gauge 16.8 cm guide was advanced to the adenopathy. Needle position confirmed with CT. 18 gauge core biopsies obtained. Samples placed in saline. Needle removed. Postprocedure imaging demonstrates no hemorrhage or hematoma. Patient tolerated the procedure well without complication. Vital sign monitoring by nursing staff during the procedure will continue as patient is in the special procedures unit for post procedure observation. FINDINGS: The images document guide needle placement within the left retroperitoneal periaortic adenopathy. Post biopsy images demonstrate no hemorrhage or hematoma. COMPLICATIONS: None immediate. IMPRESSION: Successful CT-guided core biopsy of the left retroperitoneal periaortic adenopathy Electronically Signed   By: [name].  Shick M.D.   On: 05/11/2019 14:24   Ct Renal Stone Study  Result Date: 05/25/2019 CLINICAL DATA:  Low back pain, generalized abdominal pain EXAM: CT ABDOMEN AND PELVIS WITHOUT CONTRAST TECHNIQUE: Multidetector CT imaging of the abdomen and pelvis was performed following the standard protocol without IV contrast. COMPARISON:  08/31/2018 FINDINGS: Lower chest: small bilateral pleural effusions, new since prior study. Small nodules in the right lower lobe are stable. Bibasilar atelectasis. Heart is normal size. Mediastinal adenopathy partially imaged on the upper images as seen on prior study. Heart is normal size. Hepatobiliary: No  focal liver abnormality is seen. Status post cholecystectomy. No biliary dilatation. Pancreas: No focal abnormality or ductal dilatation. Spleen: Spleen mildly enlarged with a craniocaudal length of 13.9 cm. This is stable when compared to prior study. No focal splenic abnormality. Adrenals/Urinary Tract: Left renal cyst measures 6 cm. No hydronephrosis. Adrenal glands and urinary bladder unremarkable. Stomach/Bowel: Appendix normal. Stomach, large and small bowel grossly unremarkable. Vascular/Lymphatic: Aortic atherosclerosis. No aneurysm. Extensive retroperitoneal adenopathy. This continues into the pelvis/iliac chains. This is also noted in the retrocrural region. This is unchanged since prior study. Reproductive: Mild prostate prominence. Other: No free fluid or free air. There is extensive stranding/soft tissue density noted in the upper retroperitoneum around the adrenal glands. This has progressed significantly since prior study and is of unknown etiology. Musculoskeletal: Lytic lesion within the T7 vertebral body is stable. Lytic lesion within the L2 vertebral body also stable. IMPRESSION: Extensive retroperitoneal adenopathy in the abdomen and pelvis. Adenopathy in the visualized lower mediastinum.  This is unchanged since prior study and could reflect lymphoma or metastatic disease. Extensive stranding in the retroperitoneum in the upper abdomen around the adrenal glands. This is of unknown etiology, progressed since prior study. Small bilateral pleural effusions, new since prior study. Stable small lower lobe pulmonary nodules. Bibasilar atelectasis. Splenomegaly, stable. Aortic atherosclerosis. Stable lytic lesions in the T7 and L2 vertebral bodies. Electronically Signed   By: Rolm Baptise M.D.   On: 05/25/2019 00:04    Lab Data:  CBC: Recent Labs  Lab 05/31/2019 2057 05/25/19 0109 05/26/19 0327  WBC 6.9 7.8 6.2  HGB 11.9* 12.0* 11.6*  HCT 38.3* 38.4* 37.8*  MCV 91.4 92.1 91.7  PLT 150 153  532   Basic Metabolic Panel: Recent Labs  Lab 06/13/2019 2057 05/25/19 0109 05/26/19 0327 05/27/19 0409 05/28/19 0425  NA 131* 131* 130* 130* 129*  K 4.5 4.4 4.2 4.4 4.7  CL 92* 93* 93* 91* 90*  CO2 32 30 27 30 30   GLUCOSE 121* 129* 122* 129* 129*  BUN 30* 35* 29* 31* 34*  CREATININE 2.18* 2.19* 1.49* 1.34* 1.52*  CALCIUM 8.9 8.9 8.7* 8.9 9.0   GFR: Estimated Creatinine Clearance: 66.2 mL/min (A) (by C-G formula based on SCr of 1.52 mg/dL (H)). Liver Function Tests: Recent Labs  Lab 05/21/2019 2057 05/25/19 0109  AST 32 32  ALT 19 19  ALKPHOS 114 109  BILITOT 1.1 1.5*  PROT 5.6* 5.6*  ALBUMIN 3.1* 3.3*   Recent Labs  Lab 05/21/2019 2057  LIPASE 55*   No results for input(s): AMMONIA in the last 168 hours. Coagulation Profile: No results for input(s): INR, PROTIME in the last 168 hours. Cardiac Enzymes: No results for input(s): CKTOTAL, CKMB, CKMBINDEX, TROPONINI in the last 168 hours. BNP (last 3 results) No results for input(s): PROBNP in the last 8760 hours. HbA1C: Recent Labs    05/26/19 1112  HGBA1C 5.4   CBG: Recent Labs  Lab 05/27/19 1210 05/27/19 1748 05/27/19 2004 05/28/19 0755 05/28/19 1156  GLUCAP 149* 178* 161* 121* 160*   Lipid Profile: No results for input(s): CHOL, HDL, LDLCALC, TRIG, CHOLHDL, LDLDIRECT in the last 72 hours. Thyroid Function Tests: No results for input(s): TSH, T4TOTAL, FREET4, T3FREE, THYROIDAB in the last 72 hours. Anemia Panel: No results for input(s): VITAMINB12, FOLATE, FERRITIN, TIBC, IRON, RETICCTPCT in the last 72 hours. Urine analysis:    Component Value Date/Time   COLORURINE YELLOW 05/23/2019 2212   APPEARANCEUR HAZY (A) 06/16/2019 2212   LABSPEC 1.017 06/15/2019 2212   PHURINE 5.0 05/29/2019 2212   GLUCOSEU NEGATIVE 06/19/2019 2212   HGBUR NEGATIVE 06/03/2019 2212   BILIRUBINUR NEGATIVE 06/15/2019 2212   KETONESUR NEGATIVE 06/02/2019 2212   PROTEINUR NEGATIVE 05/31/2019 2212   UROBILINOGEN 1.0  02/20/2014 1809   NITRITE NEGATIVE 06/03/2019 2212   LEUKOCYTESUR NEGATIVE 06/03/2019 2212     Karalee Hauter M.D. [facility] 05/28/2019, 1:32 PM  Pager: [phone] Between 7am to 7pm - call Pager - [phone]  After 7pm go to [email or web address] - password [email or web address]  Call night coverage person covering after 7pm

## 2019-05-29 ENCOUNTER — Ambulatory Visit
Admit: 2019-05-29 | Discharge: 2019-05-29 | Disposition: A | Discharge status: Home / Self Care | Payer: Medicare Other | Attending: Radiation Oncology | Admitting: Radiation Oncology

## 2019-05-29 DIAGNOSIS — Z7189 Other specified counseling: Secondary | ICD-10-CM

## 2019-05-29 DIAGNOSIS — M5440 Lumbago with sciatica, unspecified side: Secondary | ICD-10-CM

## 2019-05-29 DIAGNOSIS — C7951 Secondary malignant neoplasm of bone: Secondary | ICD-10-CM | POA: Diagnosis not present

## 2019-05-29 DIAGNOSIS — J410 Simple chronic bronchitis: Secondary | ICD-10-CM

## 2019-05-29 DIAGNOSIS — C799 Secondary malignant neoplasm of unspecified site: Secondary | ICD-10-CM

## 2019-05-29 LAB — BASIC METABOLIC PANEL
Anion gap: 7 (ref 5–15)
BUN: 36 mg/dL — ABNORMAL HIGH (ref 8–23)
CO2: 31 mmol/L (ref 22–32)
Calcium: 9.1 mg/dL (ref 8.9–10.3)
Chloride: 90 mmol/L — ABNORMAL LOW (ref 98–111)
Creatinine, Ser: 1.52 mg/dL — ABNORMAL HIGH (ref 0.61–1.24)
GFR calc Af Amer: 52 mL/min — ABNORMAL LOW (ref >60)
GFR calc non Af Amer: 44 mL/min — ABNORMAL LOW (ref >60)
Glucose, Bld: 122 mg/dL — ABNORMAL HIGH (ref 70–99)
Potassium: 5.1 mmol/L (ref 3.5–5.1)
Sodium: 128 mmol/L — ABNORMAL LOW (ref 135–145)

## 2019-05-29 LAB — GLUCOSE, CAPILLARY
Glucose-Capillary: 122 mg/dL — ABNORMAL HIGH (ref 70–99)
Glucose-Capillary: 139 mg/dL — ABNORMAL HIGH (ref 70–99)
Glucose-Capillary: 126 mg/dL — ABNORMAL HIGH (ref 70–99)
Glucose-Capillary: 155 mg/dL — ABNORMAL HIGH (ref 70–99)

## 2019-05-29 MED ORDER — IPRATROPIUM-ALBUTEROL 0.5-2.5 (3) MG/3ML IN SOLN
3.0000 mL | RESPIRATORY_TRACT | Status: DC
  Administered 2019-05-29 (×2): 3 mL via RESPIRATORY_TRACT
  Filled 2019-05-29: qty 3

## 2019-05-29 MED ORDER — FUROSEMIDE 10 MG/ML IJ SOLN
40.0000 mg | Freq: Every day | INTRAMUSCULAR | Status: DC
  Administered 2019-05-29 – 2019-05-30 (×2): 40 mg via INTRAVENOUS
  Filled 2019-05-29 (×2): qty 4

## 2019-05-29 MED ORDER — FUROSEMIDE 40 MG PO TABS: 40.0000 mg | ORAL_TABLET | Freq: Every day | ORAL | Status: DC

## 2019-05-29 MED ORDER — NALOXONE HCL 0.4 MG/ML IJ SOLN: 0.4000 mg | INTRAMUSCULAR | Status: DC | PRN

## 2019-05-29 MED ORDER — HYDROMORPHONE HCL 1 MG/ML IJ SOLN: 0.5000 mg | INTRAMUSCULAR | Status: DC | PRN

## 2019-05-29 MED ORDER — IPRATROPIUM-ALBUTEROL 0.5-2.5 (3) MG/3ML IN SOLN
3.0000 mL | RESPIRATORY_TRACT | Status: DC
  Administered 2019-05-29 (×2): 3 mL via RESPIRATORY_TRACT
  Filled 2019-05-29 (×2): qty 3

## 2019-05-29 MED ORDER — HYDROMORPHONE HCL 1 MG/ML IJ SOLN
0.2000 mg | INTRAMUSCULAR | Status: DC | PRN
  Administered 2019-05-29: 22:00:00 0.2 mg via INTRAVENOUS
  Administered 2019-05-30 (×2): 0.5 mg via INTRAVENOUS
  Administered 2019-05-30: 0.3 mg via INTRAVENOUS
  Administered 2019-05-30 – 2019-06-04 (×15): 0.5 mg via INTRAVENOUS
  Filled 2019-05-29: qty 1
  Filled 2019-05-29 (×9): qty 0.5
  Filled 2019-05-29: qty 1
  Filled 2019-05-29 (×9): qty 0.5

## 2019-05-29 MED ORDER — NALOXONE HCL 0.4 MG/ML IJ SOLN
INTRAMUSCULAR | Status: AC
  Filled 2019-05-29: qty 1

## 2019-05-29 MED ORDER — IPRATROPIUM-ALBUTEROL 0.5-2.5 (3) MG/3ML IN SOLN
3.0000 mL | Freq: Four times a day (QID) | RESPIRATORY_TRACT | Status: DC
  Administered 2019-05-30: 3 mL via RESPIRATORY_TRACT
  Filled 2019-05-29: qty 3

## 2019-05-29 MED ORDER — ENOXAPARIN SODIUM 80 MG/0.8ML ~~LOC~~ SOLN
80.0000 mg | SUBCUTANEOUS | Status: DC
  Administered 2019-05-30 – 2019-06-05 (×7): 80 mg via SUBCUTANEOUS
  Filled 2019-05-29 (×6): qty 0.8

## 2019-05-29 NOTE — Progress Notes (Signed)
[facility]                                                                              Patient Demographics  Shane Watson, is a 73 y.o. male, DOB - June 23, 1946, GXQ:[id number]  Admit date - 06/18/2019   Admitting Physician Jani Gravel, MD  Outpatient Primary MD for the patient is Shirline Frees, MD  Outpatient specialists:   LOS - 4  days   Medical records reviewed and are as summarized below:    Chief Complaint  Patient presents with   Back Pain       Brief summary   73 year old male with history of hypertension, chronic back pain/neuropathy, BPH, CKD stage III, COPD, CAD, diabetes mellitus, GERD, obstructive sleep apnea on CPAP evaluated by PCP few weeks back for back pain/abdominal pain, underwent outpatient work upwith CT scan of the abdomen pelvis on 10/12showing lower mediastinal and abdominal/pelvic lymphadenopathy  related to lymphoma or metastatic disease as well as tiny pulmonary nodules in the right lung base which were stable since the prior study->PET scan performed on 05/04/2019. PET scan results showed hypermetabolic mediastinal prevascular nodes and hypermetabolic retroperitoneal and mesenteric nodes consistent with lymphoma versus granulomatous disease (sarcoidosis),lytic lesion in the T7 vertebral body with moderate metabolic activity concerning for skeletal metastasis versus sarcoid lesion, small pulmonary nodules do not have associated metabolic activity-->patient underwent CT-guided retroperitoneal lymph node biopsy by IR on 10/22 and postprocedure complained of pain radiating to his groin-NSAIDs were recommended. Pathology results indicate undifferentiated adenocarcinoma --> He was referred for oncology evaluation and was scheduled to see Dr Irene Limbo on 11/10.  Patient had worsening back pain and presented to Urgent care on 11/2 and prescribed 5mg  Oxycodone-1 tab was not enough and 2 tabs were making him too drowsy. Patient was also seen by his  primary pulmonologist recently as outpatient for chest/abdominal pain complaints- felt to have subdiaphragmatic pain/bloating and dietary changes were advised. Last colonoscopy was 3 years back.  He presented to the [facility] ED on 11/5 with complaints of persistentbackpain radiating to left anterior abdomen.He also reports worsening lower extremity edema.   Assessment & Plan    Principal Problem: Intractable back pain, lytic bony lesions secondary to metastatic adenocarcinoma with unknown primary -Recent PSA WNL. -Oncology, radiation oncology consulted for palliative XRT -Highly appreciate palliative medicine following for pain management and goals of care.  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  AFP CEA, hCG, PSA normal -Per oncology, Dr. Irene Limbo, this is incurable condition, options for treatment are all palliative.   -XRT treatments to begin on 11/9, radiation oncology recommending first 1 or 2 radiation treatments inpatient if possible and then can continue his XRT treatments outpatient -Sent message to Dr Irene Limbo about patient's son requesting meeting with oncology for options for care and GOC   Leg swelling likely due to hypoalbuminemia -CT also reported small bilateral pleural effusions -BNP 13.7.  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  Placed on IV Lasix, fluid restriction 1500 cc, will recheck sodium in a.m.  Code Status: Full code DVT Prophylaxis:  Lovenox  Family Communication: Discussed all imaging results, lab results, explained to the patient   Disposition Plan:   Patient wants to have his son discuss the options with oncology.  Time Spent in minutes 25 minutes  Procedures:  None  Consultants:   Oncology Radiation oncology Palliative medicine  Antimicrobials:   Anti-infectives (From admission, onward)   Start     Dose/Rate Route Frequency Ordered Stop   05/25/19 0045  aztreonam (AZACTAM) 2 g in sodium chloride 0.9 % 100 mL IVPB  Status:  Discontinued     2 g 200 mL/hr over 30 Minutes Intravenous Every 8 hours 05/25/19 0035 05/25/19 0522         Medications  Scheduled Meds:  aspirin EC  81 mg Oral Daily   atorvastatin  40 mg Oral Daily   colesevelam  625 mg Oral BID WC   diltiazem  90 mg Oral Daily   enoxaparin (LOVENOX) injection  60 mg Subcutaneous Q24H   famotidine  20 mg Oral QHS   furosemide  40 mg Intravenous Daily   gabapentin  300 mg Oral BID   insulin aspart  0-15 Units Subcutaneous TID WC   insulin aspart  0-5 Units Subcutaneous QHS   ipratropium-albuterol  3 mL Nebulization Q4H WA   lidocaine  3 patch Transdermal Q24H   linagliptin  5 mg Oral Daily   loratadine  10 mg Oral Daily   magnesium  oxide  400 mg Oral Daily   meclizine  25 mg Oral Daily   mometasone-formoterol  2 puff Inhalation BID   pantoprazole  40 mg Oral Daily   polyethylene glycol  17 g Oral Daily   Continuous Infusions: PRN Meds:.acetaminophen **OR** acetaminophen, albuterol, diphenoxylate-atropine, hydrALAZINE, HYDROmorphone (DILAUDID) injection, ondansetron (ZOFRAN) IV, oxybutynin, polyvinyl alcohol      Subjective:   Shane Watson was seen and examined today.  No acute issues, no acute events overnight.  No fevers or chills.  Sitting up in the chair.  Has significant peripheral edema, upper wheezing.    No nausea vomiting, abdominal pain. Objective:   Vitals:   05/29/19 0641 05/29/19 0927 05/29/19 0930 05/29/19 1222  BP: 93/64 (!) 160/53    Pulse: 76 76    Resp: 14 20    Temp: (!) 97.5 F (36.4 C)     TempSrc: Oral     SpO2: 93% 94% 94% 90%  Weight:      Height:        Intake/Output Summary (Last 24 hours) at 05/29/2019  1402 Last data filed at 05/29/2019 0958 Gross per 24 hour  Intake 260 ml  Output 300 ml  Net -40 ml     Wt Readings from Last 3 Encounters:  05/29/19 (!) 160.9 kg  05/15/19 (!) 157.9 kg  05/11/19 (!) 154.2 kg   Physical Exam  General: Alert and oriented x 3, NAD  Eyes:   HEENT:  Atraumatic, normocephalic  Cardiovascular: S1 S2 clear, no murmurs, RRR.  3+ pitting edema bilaterally, with fluid blisters  Respiratory: Upper airway wheezing  Gastrointestinal: Soft, nontender, nondistended, NBS  Ext: 3+ pitting edema bilaterally  Neuro: no new deficits  Musculoskeletal: No cyanosis, clubbing  Skin: No rashes  Psych: Normal affect and demeanor, alert and oriented x3       Data Reviewed:  I have personally reviewed following labs and imaging studies  Micro Results Recent Results (from the past 240 hour(s))  SARS CORONAVIRUS 2 (TAT 6-24 HRS) Nasopharyngeal Nasopharyngeal Swab     Status: None   Collection Time: 05/25/19  1:09 AM   Specimen:  Nasopharyngeal Swab  Result Value Ref Range Status   SARS Coronavirus 2 NEGATIVE NEGATIVE Final    Comment: (NOTE) SARS-CoV-2 target nucleic acids are NOT DETECTED. The SARS-CoV-2 RNA is generally detectable in upper and lower respiratory specimens during the acute phase of infection. Negative results do not preclude SARS-CoV-2 infection, do not rule out co-infections with other pathogens, and should not be used as the sole basis for treatment or other patient management decisions. Negative results must be combined with clinical observations, patient history, and epidemiological information. The expected result is Negative. Fact Sheet for Patients: SugarRoll.be Fact Sheet for Healthcare Providers: https://www.woods-mathews.com/ This test is not yet approved or cleared by the [place] FDA and  has been authorized for detection and/or diagnosis of SARS-CoV-2 by FDA under an Emergency Use Authorization (EUA). This EUA will remain  in effect (meaning this test can be used) for the duration of the COVID-19 declaration under Section 56 4(b)(1) of the Act, 21 U.S.C. section 360bbb-3(b)(1), unless the authorization is terminated or revoked sooner. Performed at [facility], [place] [address] [place], [place] [address]     Radiology Reports Ct Abdomen Pelvis W Contrast  Result Date: 05/01/2019 CLINICAL DATA:  2 week history of right upper quadrant pain. EXAM: CT ABDOMEN AND PELVIS WITH CONTRAST TECHNIQUE: Multidetector CT imaging of the abdomen and pelvis was performed using the standard protocol following bolus administration of intravenous contrast. CONTRAST:  154mL ISOVUE-300 IOPAMIDOL (ISOVUE-300) INJECTION 61% COMPARISON:  01/17/2018 FINDINGS: Lower chest: Tiny pulmonary nodules in the posterior right lung base measuring 6 mm on images 1 and 8 of series 3 are stable in the interval, likely benign. Atelectasis or scarring is noted in the  lung bases bilaterally. Hepatobiliary: No suspicious focal abnormality within the liver parenchyma. Gallbladder surgically absent. No intrahepatic or extrahepatic biliary dilation. Pancreas: No focal mass lesion. No dilatation of the main duct. No intraparenchymal cyst. No peripancreatic edema. Spleen: No splenomegaly. No focal mass lesion. Adrenals/Urinary Tract: No adrenal nodule or mass. Right kidney unremarkable. Similar appearance of a 6.5 cm interpolar left renal cyst. Smaller low-density 14 mm interpolar left renal lesion is also stable. No evidence for hydronephrosis. No evidence for hydroureter. Bladder is decompressed. Stomach/Bowel: Stomach is unremarkable. No gastric wall thickening. No evidence of outlet obstruction. Duodenum is normally positioned as is the ligament of Treitz. No small bowel wall thickening. No small bowel dilatation. The terminal ileum is normal. The appendix  is not visualized, but there is no edema or inflammation in the region of the cecum. No gross colonic mass. No colonic wall thickening. Vascular/Lymphatic: There is abdominal aortic atherosclerosis without aneurysm. Interval development of lymphadenopathy in the lower mediastinum and abdomen. 13 mm right retrocrural node seen on 12/02. 2.4 cm short axis portal caval node visible on 30/2. 2.3 cm short axis aortocaval node visible on 38/2. 2.8 cm left para-aortic node identified on 50/2. Small lymph nodes are seen in the common iliac chains bilaterally. 15 mm short axis left common femoral node visible on 77/2. Reproductive: Prostate gland is enlarged. Other: No intraperitoneal free fluid. Musculoskeletal: Right groin hernia contains only fat. Multiple new subtle lucent lesions are identified in the bony anatomy including a 13 mm lesion in the L1 vertebral body (32/2), 1.9 cm lesion in the L2 vertebral body (40/2), subtle cortical lesion in the right iliac crest (59/2) and 10 mm sacral lesion on 69/2. IMPRESSION: 1. Lower  mediastinal and abdominal/pelvic lymphadenopathy. This could be related to lymphoma or metastatic disease, but given the interval development of multiple new lucent bone lesions, metastatic disease is favored. No evidence for a primary lesion on the current exam. 2.  Aortic Atherosclerois (ICD10-170.0) 3. Tiny pulmonary nodules in the right lung base are stable since prior study, suggesting benign etiology. These results will be called to the ordering clinician or representative by the Radiologist Assistant, and communication documented in the PACS or zVision Dashboard. Electronically Signed   By: Misty Stanley M.D.   On: 05/01/2019 17:26   Nm Pet Image Initial (pi) Skull Base To Thigh  Result Date: 05/04/2019 CLINICAL DATA:  Initial staging, abdominal lymphadenopathy treatment strategy for abdominal lymphadenopathy. EXAM: NUCLEAR MEDICINE PET SKULL BASE TO THIGH TECHNIQUE: Sixteen mCi F-18 FDG was injected intravenously. Full-ring PET imaging was performed from the skull base to thigh after the radiotracer. CT data was obtained and used for attenuation correction and anatomic localization. Fasting blood glucose: 113 mg/dl COMPARISON:  None. FINDINGS: Mediastinal blood pool activity: SUV max 1.8 Liver activity: SUV max NA NECK: No hypermetabolic lymph nodes in the neck. Incidental CT findings: none CHEST: Cluster prevascular lymph nodes. Example lymph node measures 0.6 cm prevascular space with SUV max 10.7. Nodes extend to the high LEFT paratracheal nodal station but no clear supraclavicular hypermetabolic nodes. No hypermetabolic axillary nodes Incidental CT findings: Small RIGHT pleural effusion. Mild RIGHT basilar atelectasis. Nodule along the RIGHT pleural surface measures 8 mm (image 31/5 without metabolic activity. Second nodule along the posterior aspect of the LEFT upper lobe against the fissure measures 6 mm (image 57/4 also without metabolic activity. ABDOMEN/PELVIS: Cluster hypermetabolic lymph  nodes in the retroperitoneum along the aorta from the renal veins to the iliac arteries. Example LEFT aorta SUV max equal 6.2 measures 2 cm (image 143/4. Posterior the aorta SUV max equal 7.6. Noted within the mesentery on the SMA adjacent to the uncinate of the pancreas measures 2.2 cm SUV max 10.6. Smaller hypermetabolic nodes extend along the common iliac arteries. No hypermetabolic external iliac nodes or inguinal nodes. Spleen is normal metabolic activity normal volume. No abnormal activity in liver. Incidental CT findings: There is a lesion in the SKELETON: Along the posterior aspect of the T7 vertebral body there is a lytic lesion measuring 2 cm (image 80/4) which has mild metabolic activity (SUV max equal 6.0.) This lesion is new from CT September 23, 4006) Focal metabolic activity in the posterior LEFT iliac bone with SUV max equal 6.9.  No clear CT abnormality at this level. Lytic lesion in the L1 vertebral body with mild metabolic activity (SUV max equal 5.7. Incidental CT findings: none IMPRESSION: 1. Hypermetabolic mediastinal prevascular nodes and hypermetabolic retroperitoneal and mesenteric nodes consistent with lymphoma versus granulomatous disease (sarcoidosis). 2. Lytic lesion in the T7 vertebral body with moderate metabolic activity is concerning for skeletal metastasis versus sarcoid lesion. 3. Small pulmonary nodules do not have associated metabolic activity. Recommend attention on follow-up. 4. Unfortunately, there are no large hypermetabolic supraclavicular or inguinal/iliac nodes for biopsy. The hypermetabolic T7 lesion is represented well on CT. The hypermetabolic LEFT iliac bone lesion is not well depicted on CT. Electronically Signed   By: Suzy Bouchard M.D.   On: 05/04/2019 13:06   Ct Biopsy  Result Date: 05/11/2019 INDICATION: Retroperitoneal adenopathy, concern for lymphoproliferative disorder EXAM: CT-GUIDED BIOPSY LEFT RETROPERITONEAL BULKY ADENOPATHY MEDICATIONS: 1% lidocaine  local ANESTHESIA/SEDATION: 0.5 mg IV Versed; 12.5 mcg IV Fentanyl Moderate Sedation Time:  13 minutes The patient was continuously monitored during the procedure by the interventional radiology nurse under my direct supervision. PROCEDURE: The procedure, risks, benefits, and alternatives were explained to the patient. Questions regarding the procedure were encouraged and answered. The patient understands and consents to the procedure. Previous imaging reviewed. Patient positioned prone. Noncontrast localization CT performed. The left bulky retroperitoneal periaortic adenopathy was localized and marked. Under sterile conditions and local anesthesia, a 17 gauge 16.8 cm guide was advanced to the adenopathy. Needle position confirmed with CT. 18 gauge core biopsies obtained. Samples placed in saline. Needle removed. Postprocedure imaging demonstrates no hemorrhage or hematoma. Patient tolerated the procedure well without complication. Vital sign monitoring by nursing staff during the procedure will continue as patient is in the special procedures unit for post procedure observation. FINDINGS: The images document guide needle placement within the left retroperitoneal periaortic adenopathy. Post biopsy images demonstrate no hemorrhage or hematoma. COMPLICATIONS: None immediate. IMPRESSION: Successful CT-guided core biopsy of the left retroperitoneal periaortic adenopathy Electronically Signed   By: [name].  Shick M.D.   On: 05/11/2019 14:24   Ct Renal Stone Study  Result Date: 05/25/2019 CLINICAL DATA:  Low back pain, generalized abdominal pain EXAM: CT ABDOMEN AND PELVIS WITHOUT CONTRAST TECHNIQUE: Multidetector CT imaging of the abdomen and pelvis was performed following the standard protocol without IV contrast. COMPARISON:  08/31/2018 FINDINGS: Lower chest: small bilateral pleural effusions, new since prior study. Small nodules in the right lower lobe are stable. Bibasilar atelectasis. Heart is normal size. Mediastinal  adenopathy partially imaged on the upper images as seen on prior study. Heart is normal size. Hepatobiliary: No focal liver abnormality is seen. Status post cholecystectomy. No biliary dilatation. Pancreas: No focal abnormality or ductal dilatation. Spleen: Spleen mildly enlarged with a craniocaudal length of 13.9 cm. This is stable when compared to prior study. No focal splenic abnormality. Adrenals/Urinary Tract: Left renal cyst measures 6 cm. No hydronephrosis. Adrenal glands and urinary bladder unremarkable. Stomach/Bowel: Appendix normal. Stomach, large and small bowel grossly unremarkable. Vascular/Lymphatic: Aortic atherosclerosis. No aneurysm. Extensive retroperitoneal adenopathy. This continues into the pelvis/iliac chains. This is also noted in the retrocrural region. This is unchanged since prior study. Reproductive: Mild prostate prominence. Other: No free fluid or free air. There is extensive stranding/soft tissue density noted in the upper retroperitoneum around the adrenal glands. This has progressed significantly since prior study and is of unknown etiology. Musculoskeletal: Lytic lesion within the T7 vertebral body is stable. Lytic lesion within the L2 vertebral body also stable. IMPRESSION: Extensive  retroperitoneal adenopathy in the abdomen and pelvis. Adenopathy in the visualized lower mediastinum. This is unchanged since prior study and could reflect lymphoma or metastatic disease. Extensive stranding in the retroperitoneum in the upper abdomen around the adrenal glands. This is of unknown etiology, progressed since prior study. Small bilateral pleural effusions, new since prior study. Stable small lower lobe pulmonary nodules. Bibasilar atelectasis. Splenomegaly, stable. Aortic atherosclerosis. Stable lytic lesions in the T7 and L2 vertebral bodies. Electronically Signed   By: Rolm Baptise M.D.   On: 05/25/2019 00:04    Lab Data:  CBC: Recent Labs  Lab 06/02/2019 2057 05/25/19 0109  05/26/19 0327  WBC 6.9 7.8 6.2  HGB 11.9* 12.0* 11.6*  HCT 38.3* 38.4* 37.8*  MCV 91.4 92.1 91.7  PLT 150 153 599   Basic Metabolic Panel: Recent Labs  Lab 05/25/19 0109 05/26/19 0327 05/27/19 0409 05/28/19 0425 05/29/19 0335  NA 131* 130* 130* 129* 128*  K 4.4 4.2 4.4 4.7 5.1  CL 93* 93* 91* 90* 90*  CO2 30 27 30 30 31   GLUCOSE 129* 122* 129* 129* 122*  BUN 35* 29* 31* 34* 36*  CREATININE 2.19* 1.49* 1.34* 1.52* 1.52*  CALCIUM 8.9 8.7* 8.9 9.0 9.1   GFR: Estimated Creatinine Clearance: 66 mL/min (A) (by C-G formula based on SCr of 1.52 mg/dL (H)). Liver Function Tests: Recent Labs  Lab 06/18/2019 2057 05/25/19 0109  AST 32 32  ALT 19 19  ALKPHOS 114 109  BILITOT 1.1 1.5*  PROT 5.6* 5.6*  ALBUMIN 3.1* 3.3*   Recent Labs  Lab 05/26/2019 2057  LIPASE 55*   No results for input(s): AMMONIA in the last 168 hours. Coagulation Profile: No results for input(s): INR, PROTIME in the last 168 hours. Cardiac Enzymes: No results for input(s): CKTOTAL, CKMB, CKMBINDEX, TROPONINI in the last 168 hours. BNP (last 3 results) No results for input(s): PROBNP in the last 8760 hours. HbA1C: No results for input(s): HGBA1C in the last 72 hours. CBG: Recent Labs  Lab 05/28/19 1156 05/28/19 1621 05/28/19 1946 05/29/19 0748 05/29/19 1205  GLUCAP 160* 122* 153* 122* 139*   Lipid Profile: No results for input(s): CHOL, HDL, LDLCALC, TRIG, CHOLHDL, LDLDIRECT in the last 72 hours. Thyroid Function Tests: No results for input(s): TSH, T4TOTAL, FREET4, T3FREE, THYROIDAB in the last 72 hours. Anemia Panel: No results for input(s): VITAMINB12, FOLATE, FERRITIN, TIBC, IRON, RETICCTPCT in the last 72 hours. Urine analysis:    Component Value Date/Time   COLORURINE YELLOW 06/01/2019 2212   APPEARANCEUR HAZY (A) 06/13/2019 2212   LABSPEC 1.017 05/23/2019 2212   PHURINE 5.0 05/28/2019 2212   GLUCOSEU NEGATIVE 05/29/2019 2212   HGBUR NEGATIVE 06/18/2019 2212   BILIRUBINUR NEGATIVE  05/23/2019 2212   KETONESUR NEGATIVE 05/23/2019 2212   PROTEINUR NEGATIVE 06/04/2019 2212   UROBILINOGEN 1.0 02/20/2014 1809   NITRITE NEGATIVE 06/19/2019 2212   LEUKOCYTESUR NEGATIVE 06/10/2019 2212     Seraphine Gudiel M.D. [facility] 05/29/2019, 2:02 PM  Pager: [phone] Between 7am to 7pm - call Pager - [phone]  After 7pm go to [email or web address] - password [email or web address]  Call night coverage person covering after 7pm

## 2019-05-29 NOTE — Progress Notes (Signed)
Patient given Lasix 40 mg IV per MAR at 0919. Patient has only voided about 125cc of yellow urine since 7a. Bladder scan performed and only shows <50cc of urine in the bladder. Dr. Tana Coast paged to make aware of low urine output.    Patient has also been lethargic today after starting patient on BID OxyContin last night. Patient received 2 does thus far. Dr. Domingo Cocking made aware of this via in person conversation.  Will continue to monitor patient and carry out any new orders if written.   Shane Watson, [name] 05/29/2019 2:51 PM

## 2019-05-29 NOTE — Care Management Important Message (Signed)
Important Message  Patient Details IM Letter given to Cookie McGibboney RN to present to the Patient Name: Shane Watson MRN: [id number] Date of Birth: [birth year]   Medicare Important Message Given:  Yes     Kerin Salen 05/29/2019, 1:02 PM

## 2019-05-29 NOTE — Progress Notes (Signed)
Daily Progress Note   Patient Name: Shane Watson       Date: 05/29/2019 DOB: 1945/07/25  Age: 73 y.o. MRN#: [id number] Attending Physician: Mendel Corning, MD Primary Care Physician: Shirline Frees, MD Admit Date: 06/12/2019  Reason for Consultation/Follow-up: Establishing goals of care and Pain control  Subjective: I saw and examined Mr. Shane Watson today.  Dr. Irene Limbo present at the bedside on my arrival and reviewing care options with patient and his son.  Unfortunately, Mr. Shane Watson is not able to fully follow conversation today as he is confused/sleepy.  RN reports that this has been case throughout the day today.  Opioids adjusted and he was placed on CPAP with improvement but still not back to baseline.  Attempted to discuss care plan with patient and his son, but he was not really able to follow conversation.    Length of Stay: 4  Current Medications: Scheduled Meds:   aspirin EC  81 mg Oral Daily   atorvastatin  40 mg Oral Daily   colesevelam  625 mg Oral BID WC   diltiazem  90 mg Oral Daily   [START ON 05/30/2019] enoxaparin (LOVENOX) injection  80 mg Subcutaneous Q24H   famotidine  20 mg Oral QHS   furosemide  40 mg Intravenous Daily   gabapentin  300 mg Oral BID   insulin aspart  0-15 Units Subcutaneous TID WC   insulin aspart  0-5 Units Subcutaneous QHS   [START ON 05/30/2019] ipratropium-albuterol  3 mL Nebulization QID   lidocaine  3 patch Transdermal Q24H   linagliptin  5 mg Oral Daily   loratadine  10 mg Oral Daily   magnesium oxide  400 mg Oral Daily   meclizine  25 mg Oral Daily   mometasone-formoterol  2 puff Inhalation BID   pantoprazole  40 mg Oral Daily   polyethylene glycol  17 g Oral Daily    Continuous Infusions:   PRN  Meds: acetaminophen **OR** acetaminophen, albuterol, diphenoxylate-atropine, hydrALAZINE, HYDROmorphone (DILAUDID) injection, naLOXone (NARCAN)  injection, ondansetron (ZOFRAN) IV, oxybutynin, polyvinyl alcohol  Physical Exam         General: Alert, awake, in no acute distress. Confused and not able to follow conversation. HEENT: No bruits, no goiter, no JVD Heart: Regular rate and rhythm. No murmur appreciated. Lungs: Good air movement, clear Abdomen:  Soft, nontender, nondistended, positive bowel sounds.  Obese. Skin: Warm and dry  Vital Signs: BP (!) 141/51 (BP Location: Left Arm)    Pulse 70    Temp 98 F (36.7 C)    Resp 18    Ht _0  (1.803 m)    Wt (!) 160.9 kg    SpO2 97%    BMI 49.48 kg/m  SpO2: SpO2: 97 % O2 Device: O2 Device: CPAP O2 Flow Rate: O2 Flow Rate (L/min): 2 L/min  Intake/output summary:   Intake/Output Summary (Last 24 hours) at 05/29/2019 2325 Last data filed at 05/29/2019 2000 Gross per 24 hour  Intake 380 ml  Output 150 ml  Net 230 ml   LBM: Last BM Date: 05/28/19 Baseline Weight: Weight: (!) 157.9 kg Most recent weight: Weight: (!) 160.9 kg       Palliative Assessment/Data:      Patient Active Problem List   Diagnosis Date Noted   Adenocarcinoma, 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[place])    Bone metastases ([place])    Counseling regarding advance care planning and goals of care    ARF (acute renal failure) ([place]) 05/25/2019   Back pain 28/36/6294   Diastolic CHF ([place]) 76/54/6503   IBS (irritable bowel syndrome) 05/15/2019   Acute bronchitis with COPD ([place]) 07/27/2018   Abnormal CT scan of lung 03/30/2017   Morbid obesity ([place]) 10/08/2015   Lower extremity edema 10/08/2015   S/P shoulder replacement 10/08/2015   Fracture, humerus, proximal 10/07/2015   Coronary artery disease    Hypertensive heart disease    CKD (chronic kidney disease), stage III    Hyperlipidemia    COPD (chronic obstructive pulmonary disease) (H[facility])    Morbid obesity due to  excess calories ([place]) 02/25/2015   Dyspnea 02/04/2015   CKD (chronic kidney disease) 10/09/2014   Coronary artery disease due to lipid rich plaque 10/09/2014   Chronic respiratory failure with hypercapnia (HCC) 05/08/2014   Cellulitis of left lower extremity 02/20/2014   Leukocytosis, unspecified 02/20/2014   COPD exacerbation ([place]) 02/20/2014   Dyslipidemia 02/20/2014   CKD (chronic kidney disease) stage 4, GFR 15-29 ml/min (HCC) 02/20/2014   Multiple pulmonary nodules 11/01/2013   Asthmatic bronchitis , chronic (HCC)   FEV1/FVC nl p bronchodilators  05/03/2013   Obstructive sleep apnea 05/02/2013    Palliative Care Assessment & Plan   Patient Profile: 73 y.o. male  with past medical history of hypertension, hyperlipidemia, diabetes with peripheral neuropathy, CKD stage III, CAD, anemia, BPH admitted on 05/26/2019 with worsening back pain.  Earlier this month, patient had CT of abdomen pelvis that showed lower mediastinal and abdominal/pelvic adenopathy which could be related to lymphoma or metastatic disease.  There are also multiple lucent bone lesions concerning for metastatic disease.  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  Staining profile is nonspecific.  He is currently admitted for worsened back pain with plan to start radiation therapy on Monday.  Palliative consulted for pain management and goals of care.  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[place])    Bone metastases ([place])    Counseling regarding advance care planning and goals of care    ARF (acute renal failure) ([place]) 05/25/2019   Back pain 54/65/6812   Diastolic CHF ([place]) 75/17/0017   IBS (irritable bowel syndrome) 05/15/2019   Acute bronchitis with COPD ([place]) 07/27/2018   Abnormal CT scan of lung 03/30/2017  Morbid obesity  ([place]) 10/08/2015   Lower extremity edema 10/08/2015   S/P shoulder replacement 10/08/2015   Fracture, humerus, proximal 10/07/2015   Coronary artery disease    Hypertensive heart disease    CKD (chronic kidney disease), stage III    Hyperlipidemia    COPD (chronic obstructive pulmonary disease) ([place])    Morbid obesity due to excess calories ([place]) 02/25/2015   Dyspnea 02/04/2015   CKD (chronic kidney disease) 10/09/2014   Coronary artery disease due to lipid rich plaque 10/09/2014   Chronic respiratory failure with hypercapnia ([place]) 05/08/2014   Cellulitis of left lower extremity 02/20/2014   Leukocytosis, unspecified 02/20/2014   COPD exacerbation ([place]) 02/20/2014   Dyslipidemia 02/20/2014   CKD (chronic kidney disease) stage 4, GFR 15-29 ml/min (HCC) 02/20/2014   Multiple pulmonary nodules 11/01/2013   Asthmatic bronchitis , chronic (HCC)   FEV1/FVC nl p bronchodilators  05/03/2013   Obstructive sleep apnea 05/02/2013     Recommendations/Plan:  GOC: Patient met today with Dr. Irene Limbo.  Unfortunately, he is sleepy/confused and limited in ability to make decisions about goals this evening.  Attempted to discuss CODE STATUS with patient and son, but he was unable to follow conversation consistently to determine goals.  Discussed plan to revisit goals tomorrow when he is hopefully more awake.  I reviewed advanced directives, HCPOA, living will, and MOST form with son today.  I gave him a copy of [employer] Choices for [employer]oving [employer] to review.  Pain: Patient complaining of back pain currently.  Was sleepy throughout the day but more awake following CPAP.  He did not require Narcan. MAR reveals today that he had oxycontin 34m as well as one dose of 170mdilaudid.  He is awake on my exam but still confused and cannot follow conversation effectively to discuss [facility] Not currently concerned about his respiratory status as he is better following CPAP.  He is requesting pain medication  and states he is miserable.  Discussed with his son that we will need to determine goals and if pain management is primary goal moving forward.  Based on his improvement with CPAP and the fact that his total opioids today are actually lower than the past couple of days, I believe that he likely became sleepy following medication, did not wear his CPAP, and became hypercapnic.  I stopped his oxycontin.  At this time, I would not give further narcotics, but I discussed with bedside staff plan to decrease dilaudid dose (from dilaudid 0.5-61m38mV down to dilaudid 0.2-0.5mg57m).  If he is more awake and in pain, would only give narcotics as long as he is willing to wear CPAP for any sleep, including naps. He absolutely must wear CPAP to sleep if he receives any type of pain medication.    Goals of Care and Additional Recommendations:  Limitations on Scope of Treatment: Full Scope Treatment  Code Status:    Code Status Orders  (From admission, onward)         Start     Ordered   05/25/19 0036  Full code  Continuous     05/25/19 0037        Code Status History    Date Active Date Inactive Code Status Order ID Comments User Context   10/07/2015 1856 10/10/2015 1352 Full Code [id number] Speedatient   02/20/2014 2021 02/23/2014 2012 Full Code [id number] Blaze Inpatient   Advance Care Planning Activity  Prognosis:   Guarded  Discharge Planning:  To Be Determined  Care plan was discussed with patient,   Thank you for allowing the Palliative Medicine Team to assist in the care of this patient.   Time In: 1800 Time Out: 1850 Total Time 50 Prolonged Time Billed  No      Greater than 50%  of this time was spent counseling and coordinating care related to the above assessment and plan.  Micheline Rough, MD  Please contact Palliative Medicine Team phone at [phone] for questions and concerns.

## 2019-05-29 NOTE — Progress Notes (Signed)
Patient lethargic. Dr. Tana Coast made aware. Orders received to give Narcan 0.4mg  IV. Dr. Irene Limbo at bedside placed patient on CPAP for about 30 minutes. Dr. Irene Limbo asked to wait to give the Narcan to see if patient responds to CPAP. Patient did become more alert after wearing CPAP. Dr. Irene Limbo verbalized the patient needs to wear his CPAP any time he is sleeping. Dr. Domingo Cocking also at bedside and verbalized to RN that Narcan does not need to be given at this time. Patient is alert and breathing adequately at this time. Will place patient back on CPAP after eating dinner. Will advise night RN to continue to monitor patient and give Narcan if he becomes too lethargic.   Janell Keeling, [name] 05/29/2019 6:50 PM

## 2019-05-29 NOTE — Progress Notes (Signed)
I saw and examined Shane Watson last evening.  Plan for initiation of OxyContin 10 BID.  His son is in town now and they would like to discuss with Dr Irene Limbo prior to further [place] discussions.  Will f/u tomorrow.  Full consult to follow.  Micheline Rough, MD Palliative Care [phone]

## 2019-05-29 NOTE — Progress Notes (Signed)
Patient alert and more clear minded at this time. Patient able to eat dinner. Patient had an incontinent episode of urine. Patient cleaned and repositioned. Lidocaine patches applied for back pain. Patient placed on CPAP with the agreement that if he wears the CPAP for a while and is still more alert, he can receive PRN IV Dilaudid per discussion with Palliative MD, RN and patient. Patient agreeable to this plan. Night RN to continue to monitor.   Aylinn Rydberg, [name] 05/29/2019 8:04 PM

## 2019-05-29 NOTE — Consult Note (Signed)
Consultation Note Date: 05/29/2019   Patient Name: Shane Watson  DOB: 1946/05/18  MRN: [id number]  Age / Sex: 73 y.o., male  PCP: Shane Frees, MD Referring Physician: Mendel Corning, MD  Reason for Consultation: Establishing goals of care and Pain control  HPI/Patient Profile: 73 y.o. male  with past medical history of hypertension, hyperlipidemia, diabetes with peripheral neuropathy, CKD stage III, CAD, anemia, BPH admitted on 06/04/2019 with worsening back pain.  Earlier this month, patient had CT of abdomen pelvis that showed lower mediastinal and abdominal/pelvic adenopathy which could be related to lymphoma or metastatic disease.  There are also multiple lucent bone lesions concerning for metastatic disease.  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  Staining profile is nonspecific.  He is currently admitted for worsened back pain with plan to start radiation therapy on Monday.  Palliative consulted for pain management and goals of care.  Clinical Assessment and Goals of Care: Palliative care consult received.  Discussed case with Dr. Tana Watson.  Chart reviewed including personal review of pertinent labs and imaging.  I met today with Shane Watson, his brother, and his son.  I introduced palliative care as specialized medical care for people living with serious illness. It focuses on providing relief from the symptoms and stress of a serious illness. The goal is to improve quality of life for both the patient and the family.  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  He lives alone but has a significant other here in town where he spends a lot of his day.  He reports that his girlfriend also has 2 special needs children so she is largely tied up in caring for them.  He has a son who lives in [place] who  arrived in town this evening.  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  He reports that the doctors have been explaining to him that he has metastatic disease that he has cancer and that it will be "difficult to treat."  We discussed clinical course as well as wishes moving forward in regard to long-term goals of care as well as care plan this hospitalization.    We discussed surrogate decision making and before his son arrived, he stated that he thinks that he would want to name his significant other is his surrogate decision maker as she is in town and his son is not.  We also had a initial discussion regarding the need to further clarify code status and the aggressiveness of care that he would want moving forward.  We discussed difference between a aggressive medical intervention path and a palliative, comfort focused care path.    I reviewed Shane Watson note and discussed that the goal of any treatment will be palliative in nature and not curative intent.  I also discussed concerns regarding his poor functional status and how this would  play into consideration of systemic treatment.  Both patient and his son state that they are hopeful to be able to speak with Shane Watson to get further clarification of potential treatment options.   Next, we reviewed his medications and discussed pain management.  His pain is predominantly in his back region today at site of metastatic disease.  We discussed long-term plan for better pain management being the radiation therapy that is he is supposed to start tomorrow.  He has been using round-the-clock opioids and chart review reveals that he has had the oral morphine equivalent of 80 mg of oral morphine in the past 24 hours.  We discussed trial of low-dose of long-acting medication to see if he feels better and is able to to get more sleep at night rather than waking up with pain.  With his COPD and  sleep apnea we will start with lowest dose to ensure that he tolerates and I expressed to him need to ensure that he wears his CPAP for his sleep apnea when taking pain medications.  Questions and concerns addressed.   PMT will continue to support holistically.  SUMMARY OF RECOMMENDATIONS   Pain, cancer related-overall, I feel that completion of radiation therapy is the best way for [place] to work on gaining control of his symptoms, particularly while working to spare additional opioids as he is at risk for complications related to his obesity, sleep apnea, COPD, and O2 dependence.  Will start low-dose long-acting medication to see if we can gain better pain relief overall.  This will likely need to be titrated up if pain does not improve with radiation therapy. Goals of care: Initial discussion regarding long-term goals of care started today.  Overall, patient and his son report that they understand the severity of his illness but would like to discuss further with oncology prior to making any other changes to his long-term goals of care.  I did begin initial discussion with them regarding advanced care planning including naming of the healthcare power of attorney as well as addressing issues such as CODE STATUS and any limitations there should be on his care moving forward.  Code Status/Advance Care Planning:  Full code  Additional Recommendations (Limitations, Scope, Preferences):  Full Scope Treatment  Psycho-social/Spiritual: v  Desire for further Chaplaincy support: We will likely need involvement of chaplain in order to establish healthcare power of attorney.  I plan on discussing this further with him and his son tomorrow and will make referral if needed.  Additional Recommendations: Caregiving  Support/Resources  Prognosis:   Unable to determine  Discharge Planning: To Be Determined      Primary Diagnoses: Present on Admission: . ARF (acute renal failure) ([place]) . Back pain .  CKD (chronic kidney disease) stage 4, GFR 15-29 ml/min ([facility]) . Coronary artery disease due to lipid rich plaque . COPD (chronic obstructive pulmonary disease) ([place]) . Diastolic CHF ([place])   I have reviewed the medical record, interviewed the patient and family, and examined the patient. The following aspects are pertinent.  Past Medical History:  Diagnosis Date  . Allergy   . Anemia   . Arthritis   . BPH (benign prostatic hyperplasia)   . CKD (chronic kidney disease), stage III   . Complication of anesthesia   . COPD (chronic obstructive pulmonary disease) ([place])    a. Uses Home O2 w/ exertion.  . Coronary artery disease    a. 11/2010 Cath: LM nl, LAD 69m(3.5x22 and 3.5x12 Resolute DES'), LCX  nl, OM1/2/3 nl, RCA 20-30p, PDA nl.   . Diabetes mellitus without complication ([place])   . GERD (gastroesophageal reflux disease)   . H/O echocardiogram    a. per notes in 11/2010 - nl EF, basal inf wma.  . Heart murmur   . History of kidney stones   . Hyperlipidemia   . Hypertensive heart disease   . Hyperthyroidism   . Neuropathy   . Neuropathy   . OSA (obstructive sleep apnea)    a. Uses CPAP.  Marland Kitchen PONV (postoperative nausea and vomiting)   . Vertigo    Social History   Socioeconomic History  . Marital status: Single    Spouse name: Not on file  . Number of children: Not on file  . Years of education: Not on file  . Highest education level: Not on file  Occupational History  . Occupation: Retired    Comment: Musician  . Financial resource strain: Not on file  . Food insecurity    Worry: Not on file    Inability: Not on file  . Transportation needs    Medical: Not on file    Non-medical: Not on file  Tobacco Use  . Smoking status: Former Smoker    Packs/day: 1.50    Years: 15.00    Pack years: 22.50    Types: Cigarettes    Quit date: 04/29/1987    Years since quitting: 32.1  . Smokeless tobacco: Never Used  Substance and Sexual Activity  . Alcohol use:  Yes    Comment: Rare  . Drug use: No  . Sexual activity: Not on file  Lifestyle  . Physical activity    Days per week: Not on file    Minutes per session: Not on file  . Stress: Not on file  Relationships  . Social Herbalist on phone: Not on file    Gets together: Not on file    Attends religious service: Not on file    Active member of club or organization: Not on file    Attends meetings of clubs or organizations: Not on file    Relationship status: Not on file  Other Topics Concern  . Not on file  Social History Narrative  . Not on file   Family History  Problem Relation Age of Onset  . Diabetes Mother   . Heart disease Father   . Diabetes Father   . Heart disease Sister   . Diabetes Sister    Scheduled Meds: . aspirin EC  81 mg Oral Daily  . atorvastatin  40 mg Oral Daily  . colesevelam  625 mg Oral BID WC  . diltiazem  90 mg Oral Daily  . enoxaparin (LOVENOX) injection  60 mg Subcutaneous Q24H  . famotidine  20 mg Oral QHS  . furosemide  40 mg Intravenous Daily  . gabapentin  300 mg Oral BID  . insulin aspart  0-15 Units Subcutaneous TID WC  . insulin aspart  0-5 Units Subcutaneous QHS  . ipratropium-albuterol  3 mL Nebulization Q4H  . lidocaine  3 patch Transdermal Q24H  . linagliptin  5 mg Oral Daily  . loratadine  10 mg Oral Daily  . magnesium oxide  400 mg Oral Daily  . meclizine  25 mg Oral Daily  . mometasone-formoterol  2 puff Inhalation BID  . oxyCODONE  10 mg Oral Q12H  . pantoprazole  40 mg Oral Daily  . polyethylene glycol  17 g Oral  Daily   Continuous Infusions: PRN Meds:.acetaminophen **OR** acetaminophen, albuterol, diphenoxylate-atropine, hydrALAZINE, HYDROmorphone (DILAUDID) injection, ondansetron (ZOFRAN) IV, oxybutynin, polyvinyl alcohol Medications Prior to Admission:  Prior to Admission medications   Medication Sig Start Date End Date Taking? Authorizing Provider  acetaminophen (TYLENOL) 500 MG tablet Take 500 mg by mouth  every 8 (eight) hours as needed for moderate pain.    Yes [provider]  albuterol (PROAIR HFA) 108 (90 Base) MCG/ACT inhaler 2 puffs every 4 hours as needed only  if your can't catch your breath 07/26/18  Yes Fenton Foy, NP  albuterol (PROVENTIL) (5 MG/ML) 0.5% nebulizer solution Take 0.5 mLs (2.5 mg total) by nebulization every 4 (four) hours as needed for wheezing or shortness of breath (((PLAN B))). 05/16/19  Yes Tanda Rockers, MD  aspirin EC 81 MG tablet Take 81 mg by mouth daily.   Yes [provider]  atorvastatin (LIPITOR) 40 MG tablet TAKE 1 TABLET BY MOUTH AT  BEDTIME Patient taking differently: Take 40 mg by mouth daily.  07/21/18  Yes Jerline Pain, MD  budesonide-formoterol (SYMBICORT) 160-4.5 MCG/ACT inhaler INHALE 2 PUFFS BY MOUTH  FIRST THING IN THE MORNING  AND THEN ANOTHER 2 PUFFS  ABOUT 12 HOURS LATER Patient taking differently: Inhale 2 puffs into the lungs every morning. INHALE 2 PUFFS BY MOUTH  FIRST THING IN THE MORNING 04/04/19  Yes Tanda Rockers, MD  cetirizine (ZYRTEC) 10 MG tablet Take 10 mg by mouth daily as needed for allergies.    Yes [provider]  colesevelam (WELCHOL) 625 MG tablet Take 625 mg by mouth 2 (two) times daily.   Yes [provider]  diltiazem (CARDIZEM SR) 90 MG 12 hr capsule Take 90 mg by mouth daily. 02/17/19  Yes [provider]  diltiazem (CARDIZEM) 60 MG tablet Take 60 mg by mouth 4 (four) times daily as needed (tachycardia).   Yes [provider]  diphenoxylate-atropine (LOMOTIL) 2.5-0.025 MG tablet Take 1 tablet by mouth 4 (four) times daily as needed for diarrhea or loose stools.  07/25/18  Yes [provider]  doxazosin (CARDURA) 8 MG tablet Take 8 mg by mouth at bedtime.   Yes [provider]  famotidine (PEPCID) 20 MG tablet Take 20 mg by mouth at bedtime.   Yes [provider]  furosemide (LASIX) 20 MG tablet Take 2 tablets (40 mg total) by mouth daily.  Please make yearly appt with Dr. [name] for November for future refills. 1st attempt 03/02/19  Yes Jerline Pain, MD  gabapentin (NEURONTIN) 300 MG capsule Take 1 capsule by mouth 2 (two) times daily. 07/25/18  Yes [provider]  hydrochlorothiazide (HYDRODIURIL) 25 MG tablet Take 12.5 mg by mouth daily.   Yes [provider]  JANUVIA 100 MG tablet Take 100 mg by mouth daily. 04/13/16  Yes [provider]  losartan (COZAAR) 100 MG tablet Take 100 mg by mouth daily.   Yes [provider]  magnesium oxide (MAG-OX) 400 MG tablet Take 400 mg by mouth daily.   Yes [provider]  meclizine (ANTIVERT) 12.5 MG tablet Take 25 mg by mouth daily.  02/05/16  Yes [provider]  Multiple Vitamin (MULTIVITAMIN WITH MINERALS) TABS tablet Take 1 tablet by mouth at bedtime.    Yes [provider]  nitroGLYCERIN (NITROSTAT) 0.4 MG SL tablet Place 1 tablet (0.4 mg total) under the tongue every 5 (five) minutes as needed for chest pain. 11/14/18  Yes Candee Furbish  C, MD  oxybutynin (DITROPAN) 5 MG tablet Take 5 mg by mouth daily as needed for bladder spasms.   Yes [provider]  oxyCODONE-acetaminophen (PERCOCET/ROXICET) 5-325 MG tablet Take 2 tablets by mouth every 4 (four) hours as needed for severe pain. 05/22/19  Yes Robyn Haber, MD  pantoprazole (PROTONIX) 40 MG tablet TAKE 1 TABLET BY MOUTH  DAILY 12/09/18  Yes Tanda Rockers, MD  polyethylene glycol powder [facility]) 17 GM/SCOOP powder One capful in 8 oz water daily 05/22/19  Yes Robyn Haber, MD  potassium chloride SA (K-DUR,KLOR-CON) 20 MEQ tablet Take 1 tablet by mouth  daily 02/25/16  Yes Ria Bush, MD  Propylene Glycol (SYSTANE BALANCE) 0.6 % SOLN Place 2 drops into both eyes 2 (two) times daily.   Yes [provider]  [facility] VERIO test strip U UTD ONCE A DAY 07/21/18   [provider]  OXYGEN Inhale 2 L into the lungs at bedtime.    [provider]  Respiratory Therapy Supplies (FLUTTER) DEVI Use as directed 02/04/15   Tanda Rockers, MD  SPIRIVA RESPIMAT 2.5 MCG/ACT AERS USE 2 SPRAYS(INHALATIONS)   BY MOUTH EVERY MORNING Patient not taking: No sig reported 04/04/19   Tanda Rockers, MD  UNABLE TO FIND Med Name: CPAP with 2lpm o2 with sleep    [provider]   Allergies  Allergen Reactions  . Aspirin Swelling    Angioedema-must take EC coated form only  . Ibuprofen Swelling    Angioedema   . Penicillins Hives    Did it involve swelling of the face/tongue/throat, SOB, or low BP? No Did it involve sudden or severe rash/hives, skin peeling, or any reaction on the inside of your mouth or nose? Yes Did you need to seek medical attention at a hospital or doctor's office? Yes When did it last happen?childhood  If all above answers are "NO", may proceed with cephalosporin use.   . Sulfa Antibiotics Hives   Review of Systems  Constitutional: Positive for activity change, fatigue and unexpected weight change.  Respiratory: Positive for shortness of breath and wheezing.   Cardiovascular: Positive for leg swelling.  Neurological: Positive for weakness.  Psychiatric/Behavioral: Positive for sleep disturbance.   Physical Exam  General: Alert, awake, in no acute distress.  Heart: Regular rate and rhythm. No murmur appreciated. Lungs: Good air movement, clear Abdomen: Soft, nontender, nondistended, positive bowel sounds.  Ext: + edema Skin: Warm and dry Neuro: Grossly intact, nonfocal.  Vital Signs: BP (!) 160/53 (BP Location: Left Arm)   Pulse 76   Temp (!) 97.5 F (36.4 C) (Oral)   Resp 20   Ht '5\' 11"'$  (1.803 m)   Wt (!) 160.9 kg   SpO2 94%   BMI 49.48 kg/m  Pain Scale: 0-10 POSS *See Group Information*: 1-Acceptable,Awake and alert Pain Score: Asleep   SpO2: SpO2: 94 % O2 Device:SpO2: 94 % O2 Flow Rate: .O2 Flow Rate (L/min): 2 L/min  IO: Intake/output summary:   Intake/Output Summary (Last 24  hours) at 05/29/2019 1014 Last data filed at 05/29/2019 3846 Gross per 24 hour  Intake 0 ml  Output 450 ml  Net -450 ml    LBM: Last BM Date: 05/28/19 Baseline Weight: Weight: (!) 157.9 kg Most recent weight: Weight: (!) 160.9 kg     Palliative Assessment/Data:     Time In: 1810  Time Out: 1930 Time Total: 80 Greater than 50%  of this time was spent counseling and coordinating care related to  the above assessment and plan.  Signed by: Micheline Rough, MD   Please contact Palliative Medicine Team phone at [phone] for questions and concerns.  For individual provider: See Shea Evans

## 2019-05-30 ENCOUNTER — Ambulatory Visit
Admit: 2019-05-30 | Discharge: 2019-05-30 | Disposition: A | Discharge status: Home / Self Care | Payer: Medicare Other | Attending: Radiation Oncology | Admitting: Radiation Oncology

## 2019-05-30 ENCOUNTER — Inpatient Hospital Stay (HOSPITAL_COMMUNITY): Payer: Medicare Other

## 2019-05-30 ENCOUNTER — Inpatient Hospital Stay: Payer: Medicare Other | Attending: Hematology | Admitting: Hematology

## 2019-05-30 ENCOUNTER — Telehealth: Payer: Self-pay | Admitting: Internal Medicine

## 2019-05-30 DIAGNOSIS — R609 Edema, unspecified: Secondary | ICD-10-CM

## 2019-05-30 DIAGNOSIS — C7951 Secondary malignant neoplasm of bone: Secondary | ICD-10-CM | POA: Diagnosis not present

## 2019-05-30 LAB — UPEP/UIFE/LIGHT CHAINS/TP, 24-HR UR
% BETA, Urine: 33 %
ALPHA 1 URINE: 4.7 %
Albumin, U: 16.7 %
Alpha 2, Urine: 17 %
Free Kappa Lt Chains,Ur: 99.22 mg/L (ref 0.63–113.79)
Free Kappa/Lambda Ratio: 17.41 (ref 1.03–31.76)
Free Lambda Lt Chains,Ur: 5.7 mg/L (ref 0.47–11.77)
GAMMA GLOBULIN URINE: 28.7 %
Total Protein, Urine-Ur/day: 109 mg/24 hr (ref 30–150)
Total Protein, Urine: 8.4 mg/dL
Total Volume: 1300

## 2019-05-30 LAB — CBC
HCT: 36.3 % — ABNORMAL LOW (ref 39.0–52.0)
Hemoglobin: 11.5 g/dL — ABNORMAL LOW (ref 13.0–17.0)
MCH: 28.9 pg (ref 26.0–34.0)
MCHC: 31.7 g/dL (ref 30.0–36.0)
MCV: 91.2 fL (ref 80.0–100.0)
Platelets: 161 10*3/uL (ref 150–400)
RBC: 3.98 MIL/uL — ABNORMAL LOW (ref 4.22–5.81)
RDW: 16.4 % — ABNORMAL HIGH (ref 11.5–15.5)
WBC: 7.2 10*3/uL (ref 4.0–10.5)
nRBC: 0 % (ref 0.0–0.2)

## 2019-05-30 LAB — GLUCOSE, CAPILLARY
Glucose-Capillary: 136 mg/dL — ABNORMAL HIGH (ref 70–99)
Glucose-Capillary: 117 mg/dL — ABNORMAL HIGH (ref 70–99)
Glucose-Capillary: 152 mg/dL — ABNORMAL HIGH (ref 70–99)
Glucose-Capillary: 134 mg/dL — ABNORMAL HIGH (ref 70–99)

## 2019-05-30 LAB — BASIC METABOLIC PANEL
Anion gap: 8 (ref 5–15)
BUN: 39 mg/dL — ABNORMAL HIGH (ref 8–23)
CO2: 29 mmol/L (ref 22–32)
Calcium: 9.1 mg/dL (ref 8.9–10.3)
Chloride: 90 mmol/L — ABNORMAL LOW (ref 98–111)
Creatinine, Ser: 1.59 mg/dL — ABNORMAL HIGH (ref 0.61–1.24)
GFR calc Af Amer: 49 mL/min — ABNORMAL LOW (ref >60)
GFR calc non Af Amer: 42 mL/min — ABNORMAL LOW (ref >60)
Glucose, Bld: 125 mg/dL — ABNORMAL HIGH (ref 70–99)
Potassium: 5.5 mmol/L — ABNORMAL HIGH (ref 3.5–5.1)
Sodium: 127 mmol/L — ABNORMAL LOW (ref 135–145)

## 2019-05-30 MED ORDER — ALBUTEROL SULFATE (2.5 MG/3ML) 0.083% IN NEBU: 2.5000 mg | INHALATION_SOLUTION | RESPIRATORY_TRACT | 11 refills | Status: AC | PRN

## 2019-05-30 MED ORDER — SODIUM ZIRCONIUM CYCLOSILICATE 10 G PO PACK
10.0000 g | PACK | Freq: Once | ORAL | Status: AC
  Administered 2019-05-30: 10 g via ORAL
  Filled 2019-05-30: qty 1

## 2019-05-30 MED ORDER — IPRATROPIUM-ALBUTEROL 0.5-2.5 (3) MG/3ML IN SOLN
3.0000 mL | Freq: Three times a day (TID) | RESPIRATORY_TRACT | Status: DC
  Administered 2019-05-30 – 2019-05-31 (×3): 3 mL via RESPIRATORY_TRACT
  Filled 2019-05-30 (×3): qty 3

## 2019-05-30 NOTE — Plan of Care (Signed)
  Problem: Clinical Measurements: Goal: Ability to maintain clinical measurements within normal limits will improve Outcome: Progressing Goal: Will remain free from infection Outcome: Progressing Goal: Diagnostic test results will improve Outcome: Progressing Goal: Respiratory complications will improve Outcome: Progressing Goal: Cardiovascular complication will be avoided Outcome: Progressing   Problem: Activity: Goal: Risk for activity intolerance will decrease Outcome: Progressing   Problem: Nutrition: Goal: Adequate nutrition will be maintained Outcome: Progressing   Problem: Coping: Goal: Level of anxiety will decrease Outcome: Progressing   Problem: Elimination: Goal: Will not experience complications related to bowel motility Outcome: Progressing Goal: Will not experience complications related to urinary retention Outcome: Progressing   Problem: Pain Managment: Goal: General experience of comfort will improve Outcome: Progressing-Educated patient about pain medications may not take away all pain but goal is to make him more comfortable   Problem: Safety: Goal: Ability to remain free from injury will improve Outcome: Progressing   Problem: Skin Integrity: Goal: Risk for impaired skin integrity will decrease Outcome: Progressing

## 2019-05-30 NOTE — Progress Notes (Signed)
PT Cancellation Note  Patient Details Name: Shane Watson MRN: [id number] DOB: [birth year]   Cancelled Treatment:     PT orders received and chart reviewed.  Pt reports increased pain at this time and just received pain meds.  Request PT at a later time.  PT will f/u as able.  Pt did state that mornings are better.    Mikael Spray [place] Cantero 05/30/2019, 4:28 PM

## 2019-05-30 NOTE — Telephone Encounter (Signed)
Spoke with Shane Watson and notified of recs per MW  She verbalized understanding  I have sent rx to [facility] for the new albuterol dose

## 2019-05-30 NOTE — Telephone Encounter (Signed)
That's fine but be sure that translates to 2.5 mg q 4-6 h prn

## 2019-05-30 NOTE — Progress Notes (Signed)
Bilateral lower extremity venous duplex completed. Refer to "CV Proc" under chart review to view preliminary results.  05/30/2019 3:27 PM Kelby Aline., MHA, RVT, RDCS, RDMS

## 2019-05-30 NOTE — Progress Notes (Addendum)
[facility]                                                                              Patient Demographics  Shane Watson, is a 73 y.o. male, DOB - Aug 20, 1945, WLN:[id number]  Admit date - 06/08/2019   Admitting Physician Jani Gravel, MD  Outpatient Primary MD for the patient is Shirline Frees, MD  Outpatient specialists:   LOS - 5  days   Medical records reviewed and are as summarized below:    Chief Complaint  Patient presents with   Back Pain       Brief summary   73 year old male with history of hypertension, chronic back pain/neuropathy, BPH, CKD stage III, COPD, CAD, diabetes mellitus, GERD, obstructive sleep apnea on CPAP evaluated by PCP few weeks back for back pain/abdominal pain, underwent outpatient work upwith CT scan of the abdomen pelvis on 10/12showing lower mediastinal and abdominal/pelvic lymphadenopathy  related to lymphoma or metastatic disease as well as tiny pulmonary nodules in the right lung base which were stable since the prior study->PET scan performed on 05/04/2019. PET scan results showed hypermetabolic mediastinal prevascular nodes and hypermetabolic retroperitoneal and mesenteric nodes consistent with lymphoma versus granulomatous disease (sarcoidosis),lytic lesion in the T7 vertebral body with moderate metabolic activity concerning for skeletal metastasis versus sarcoid lesion, small pulmonary nodules do not have associated metabolic activity-->patient underwent CT-guided retroperitoneal lymph node biopsy by IR on 10/22 and postprocedure complained of pain radiating to his groin-NSAIDs were recommended. Pathology results indicate undifferentiated adenocarcinoma --> He was referred for oncology evaluation and was scheduled to see Dr Irene Limbo on 11/10.  Patient had worsening back pain and presented to Urgent care on 11/2 and prescribed 5mg  Oxycodone-1 tab was not enough and 2 tabs were making him too drowsy. Patient was also seen by his  primary pulmonologist recently as outpatient for chest/abdominal pain complaints- felt to have subdiaphragmatic pain/bloating and dietary changes were advised. Last colonoscopy was 3 years back.  He presented to the [facility] ED on 11/5 with complaints of persistentbackpain radiating to left anterior abdomen.He also reports worsening lower extremity edema.   Assessment & Plan    Principal Problem: Intractable back pain, lytic bony lesions secondary to metastatic adenocarcinoma with unknown primary -Recent PSA WNL. -Oncology, radiation oncology consulted for palliative XRT -Appreciate palliative medicine for pain management and GOC, was somnolent yesterday, OxyContin was discontinued.  Much more awake after CPAP.  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  AFP CEA, hCG, PSA normal -Per oncology, Dr. Irene Limbo, this is incurable condition, options for treatment are all palliative.   -Started XRT on 11/9, day #2 today.  Radiation oncology recommended first 1 or 2 XRT's inpatient and then continue outpatient.   -Appreciate oncology, Dr. Irene Limbo following, discussing with the patient and son about the options, currently not curable condition and patient does not have a good performance status, 2 approaches hospice versus additional testing and consideration of palliative chemotherapy.  Bilateral leg swelling likely due to hypoalbuminemia, likely diastolic CHF exac - CT reported small bilateral pleural effusions.  2D echo 11/6 showed EF of 50 to 55%, mildly increased LVH.   - follow venous Dopplers bilateral lower extremity.  Sodium trending down, hold Lasix.  AKI on CKD stage III -No hydronephrosis/renal calculi on CT, extensive stranding in the retroperitoneum and upper abdomen around adrenal glands -Creatinine function 2.1 at the time of admission, currently  plateaued at 1.5 -Continue to hold HCTZ, losartan.    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  -Placed on fluid restriction, IV Lasix, sodium continues to trend down.    Code Status: Full code DVT Prophylaxis:  Lovenox  Family Communication: Discussed all imaging results, lab results, explained to the patient   Disposition Plan: Much more alert and awake today, going for XRT.  Possible DC in 24 to 48 hours if pain controlled, sodium and creatinine improving.  Needs PT evaluation.  Time Spent in minutes 25 minutes  Procedures:  None  Consultants:   Oncology Radiation oncology Palliative medicine  Antimicrobials:   Anti-infectives (From admission, onward)   Start     Dose/Rate Route Frequency Ordered Stop   05/25/19 0045  aztreonam (AZACTAM) 2 g in sodium chloride 0.9 % 100 mL IVPB  Status:  Discontinued     2 g 200 mL/hr over 30 Minutes Intravenous Every 8 hours 05/25/19 0035 05/25/19 0522         Medications  Scheduled Meds:  aspirin EC  81 mg Oral Daily   atorvastatin  40 mg Oral Daily   colesevelam  625 mg Oral BID WC   diltiazem  90 mg Oral Daily   enoxaparin (LOVENOX) injection  80 mg Subcutaneous Q24H   famotidine  20 mg Oral QHS   furosemide  40 mg Intravenous Daily   gabapentin  300 mg Oral BID    insulin aspart  0-15 Units Subcutaneous TID WC   insulin aspart  0-5 Units Subcutaneous QHS   ipratropium-albuterol  3 mL Nebulization TID   lidocaine  3 patch Transdermal Q24H   linagliptin  5 mg Oral Daily   loratadine  10 mg Oral Daily   magnesium oxide  400 mg Oral Daily   meclizine  25 mg Oral Daily   mometasone-formoterol  2 puff Inhalation BID   pantoprazole  40 mg Oral Daily   polyethylene glycol  17 g Oral Daily   Continuous Infusions: PRN Meds:.acetaminophen **OR** acetaminophen, albuterol, diphenoxylate-atropine, hydrALAZINE, HYDROmorphone (DILAUDID) injection, naLOXone (NARCAN)  injection, ondansetron (ZOFRAN) IV, oxybutynin, polyvinyl alcohol      Subjective:   Nickolus Wadding was seen and examined today.  Much more alert and oriented today.  No acute events overnight.  No fevers or chills.  Wheezing better today.  No nausea or vomiting or abdominal pain.  Still has significant peripheral edema.    Objective:   Vitals:   05/29/19 2025 05/29/19 2134 05/30/19 0448 05/30/19 0838  BP:  (!) 141/51 (!) 140/50   Pulse: 72 70 76  Resp: 20 18 18    Temp:  98 F (36.7 C) 98.7 F (37.1 C)   TempSrc:   Oral   SpO2: 96% 97% 98% 90%  Weight:   (!) 162.5 kg   Height:        Intake/Output Summary (Last 24 hours) at 05/30/2019 1252 Last data filed at 05/30/2019 1130 Gross per 24 hour  Intake 240 ml  Output 360 ml  Net -120 ml     Wt Readings from Last 3 Encounters:  05/30/19 (!) 162.5 kg  05/15/19 (!) 157.9 kg  05/11/19 (!) 154.2 kg     Physical Exam  General: Alert and oriented x 3, NAD  Eyes:   HEENT:  Cardiovascular: S1 S2 clear, no murmurs, RRR.  2-3+ pitting edema  Respiratory: CTAB, no wheezing, rales or rhonchi  Gastrointestinal: Obese, soft, nontender, nondistended, NBS  Ext: 2-3+ pitting edema bilaterally  Neuro: no new deficits  Musculoskeletal: No cyanosis, clubbing  Skin: No rashes  Psych: Normal affect and demeanor, alert  and oriented x3     Data Reviewed:  I have personally reviewed following labs and imaging studies  Micro Results Recent Results (from the past 240 hour(s))  SARS CORONAVIRUS 2 (TAT 6-24 HRS) Nasopharyngeal Nasopharyngeal Swab     Status: None   Collection Time: 05/25/19  1:09 AM   Specimen: Nasopharyngeal Swab  Result Value Ref Range Status   SARS Coronavirus 2 NEGATIVE NEGATIVE Final    Comment: (NOTE) SARS-CoV-2 target nucleic acids are NOT DETECTED. The SARS-CoV-2 RNA is generally detectable in upper and lower respiratory specimens during the acute phase of infection. Negative results do not preclude SARS-CoV-2 infection, do not rule out co-infections with other pathogens, and should not be used as the sole basis for treatment or other patient management decisions. Negative results must be combined with clinical observations, patient history, and epidemiological information. The expected result is Negative. Fact Sheet for Patients: SugarRoll.be Fact Sheet for Healthcare Providers: https://www.woods-mathews.com/ This test is not yet approved or cleared by the [place] FDA and  has been authorized for detection and/or diagnosis of SARS-CoV-2 by FDA under an Emergency Use Authorization (EUA). This EUA will remain  in effect (meaning this test can be used) for the duration of the COVID-19 declaration under Section 56 4(b)(1) of the Act, 21 U.S.C. section 360bbb-3(b)(1), unless the authorization is terminated or revoked sooner. Performed at [facility], [place] [address], [place], [place] [address]     Radiology Reports Ct Abdomen Pelvis W Contrast  Result Date: 05/01/2019 CLINICAL DATA:  2 week history of right upper quadrant pain. EXAM: CT ABDOMEN AND PELVIS WITH CONTRAST TECHNIQUE: Multidetector CT imaging of the abdomen and pelvis was performed using the standard protocol following bolus administration of intravenous  contrast. CONTRAST:  173mL ISOVUE-300 IOPAMIDOL (ISOVUE-300) INJECTION 61% COMPARISON:  01/17/2018 FINDINGS: Lower chest: Tiny pulmonary nodules in the posterior right lung base measuring 6 mm on images 1 and 8 of series 3 are stable in the interval, likely benign. Atelectasis or scarring is noted in the lung bases bilaterally. Hepatobiliary: No suspicious focal abnormality within the liver parenchyma. Gallbladder surgically absent. No intrahepatic or extrahepatic biliary dilation. Pancreas: No focal mass lesion. No dilatation of the main duct. No intraparenchymal cyst. No peripancreatic edema. Spleen: No splenomegaly. No focal mass lesion. Adrenals/Urinary Tract: No adrenal nodule or mass. Right kidney unremarkable. Similar appearance of a 6.5 cm interpolar left renal cyst. Smaller low-density 14 mm interpolar left renal lesion is also stable.  No evidence for hydronephrosis. No evidence for hydroureter. Bladder is decompressed. Stomach/Bowel: Stomach is unremarkable. No gastric wall thickening. No evidence of outlet obstruction. Duodenum is normally positioned as is the ligament of Treitz. No small bowel wall thickening. No small bowel dilatation. The terminal ileum is normal. The appendix is not visualized, but there is no edema or inflammation in the region of the cecum. No gross colonic mass. No colonic wall thickening. Vascular/Lymphatic: There is abdominal aortic atherosclerosis without aneurysm. Interval development of lymphadenopathy in the lower mediastinum and abdomen. 13 mm right retrocrural node seen on 12/02. 2.4 cm short axis portal caval node visible on 30/2. 2.3 cm short axis aortocaval node visible on 38/2. 2.8 cm left para-aortic node identified on 50/2. Small lymph nodes are seen in the common iliac chains bilaterally. 15 mm short axis left common femoral node visible on 77/2. Reproductive: Prostate gland is enlarged. Other: No intraperitoneal free fluid. Musculoskeletal: Right groin hernia  contains only fat. Multiple new subtle lucent lesions are identified in the bony anatomy including a 13 mm lesion in the L1 vertebral body (32/2), 1.9 cm lesion in the L2 vertebral body (40/2), subtle cortical lesion in the right iliac crest (59/2) and 10 mm sacral lesion on 69/2. IMPRESSION: 1. Lower mediastinal and abdominal/pelvic lymphadenopathy. This could be related to lymphoma or metastatic disease, but given the interval development of multiple new lucent bone lesions, metastatic disease is favored. No evidence for a primary lesion on the current exam. 2.  Aortic Atherosclerois (ICD10-170.0) 3. Tiny pulmonary nodules in the right lung base are stable since prior study, suggesting benign etiology. These results will be called to the ordering clinician or representative by the Radiologist Assistant, and communication documented in the PACS or zVision Dashboard. Electronically Signed   By: Misty Stanley M.D.   On: 05/01/2019 17:26   Nm Pet Image Initial (pi) Skull Base To Thigh  Result Date: 05/04/2019 CLINICAL DATA:  Initial staging, abdominal lymphadenopathy treatment strategy for abdominal lymphadenopathy. EXAM: NUCLEAR MEDICINE PET SKULL BASE TO THIGH TECHNIQUE: Sixteen mCi F-18 FDG was injected intravenously. Full-ring PET imaging was performed from the skull base to thigh after the radiotracer. CT data was obtained and used for attenuation correction and anatomic localization. Fasting blood glucose: 113 mg/dl COMPARISON:  None. FINDINGS: Mediastinal blood pool activity: SUV max 1.8 Liver activity: SUV max NA NECK: No hypermetabolic lymph nodes in the neck. Incidental CT findings: none CHEST: Cluster prevascular lymph nodes. Example lymph node measures 0.6 cm prevascular space with SUV max 10.7. Nodes extend to the high LEFT paratracheal nodal station but no clear supraclavicular hypermetabolic nodes. No hypermetabolic axillary nodes Incidental CT findings: Small RIGHT pleural effusion. Mild RIGHT  basilar atelectasis. Nodule along the RIGHT pleural surface measures 8 mm (image 65/7 without metabolic activity. Second nodule along the posterior aspect of the LEFT upper lobe against the fissure measures 6 mm (image 57/4 also without metabolic activity. ABDOMEN/PELVIS: Cluster hypermetabolic lymph nodes in the retroperitoneum along the aorta from the renal veins to the iliac arteries. Example LEFT aorta SUV max equal 6.2 measures 2 cm (image 143/4. Posterior the aorta SUV max equal 7.6. Noted within the mesentery on the SMA adjacent to the uncinate of the pancreas measures 2.2 cm SUV max 10.6. Smaller hypermetabolic nodes extend along the common iliac arteries. No hypermetabolic external iliac nodes or inguinal nodes. Spleen is normal metabolic activity normal volume. No abnormal activity in liver. Incidental CT findings: There is a lesion in the SKELETON: Along  the posterior aspect of the T7 vertebral body there is a lytic lesion measuring 2 cm (image 80/4) which has mild metabolic activity (SUV max equal 6.0.) This lesion is new from CT September 23, 6438) Focal metabolic activity in the posterior LEFT iliac bone with SUV max equal 6.9. No clear CT abnormality at this level. Lytic lesion in the L1 vertebral body with mild metabolic activity (SUV max equal 5.7. Incidental CT findings: none IMPRESSION: 1. Hypermetabolic mediastinal prevascular nodes and hypermetabolic retroperitoneal and mesenteric nodes consistent with lymphoma versus granulomatous disease (sarcoidosis). 2. Lytic lesion in the T7 vertebral body with moderate metabolic activity is concerning for skeletal metastasis versus sarcoid lesion. 3. Small pulmonary nodules do not have associated metabolic activity. Recommend attention on follow-up. 4. Unfortunately, there are no large hypermetabolic supraclavicular or inguinal/iliac nodes for biopsy. The hypermetabolic T7 lesion is represented well on CT. The hypermetabolic LEFT iliac bone lesion is not well  depicted on CT. Electronically Signed   By: Suzy Bouchard M.D.   On: 05/04/2019 13:06   Ct Biopsy  Result Date: 05/11/2019 INDICATION: Retroperitoneal adenopathy, concern for lymphoproliferative disorder EXAM: CT-GUIDED BIOPSY LEFT RETROPERITONEAL BULKY ADENOPATHY MEDICATIONS: 1% lidocaine local ANESTHESIA/SEDATION: 0.5 mg IV Versed; 12.5 mcg IV Fentanyl Moderate Sedation Time:  13 minutes The patient was continuously monitored during the procedure by the interventional radiology nurse under my direct supervision. PROCEDURE: The procedure, risks, benefits, and alternatives were explained to the patient. Questions regarding the procedure were encouraged and answered. The patient understands and consents to the procedure. Previous imaging reviewed. Patient positioned prone. Noncontrast localization CT performed. The left bulky retroperitoneal periaortic adenopathy was localized and marked. Under sterile conditions and local anesthesia, a 17 gauge 16.8 cm guide was advanced to the adenopathy. Needle position confirmed with CT. 18 gauge core biopsies obtained. Samples placed in saline. Needle removed. Postprocedure imaging demonstrates no hemorrhage or hematoma. Patient tolerated the procedure well without complication. Vital sign monitoring by nursing staff during the procedure will continue as patient is in the special procedures unit for post procedure observation. FINDINGS: The images document guide needle placement within the left retroperitoneal periaortic adenopathy. Post biopsy images demonstrate no hemorrhage or hematoma. COMPLICATIONS: None immediate. IMPRESSION: Successful CT-guided core biopsy of the left retroperitoneal periaortic adenopathy Electronically Signed   By: [name].  Shick M.D.   On: 05/11/2019 14:24   Ct Renal Stone Study  Result Date: 05/25/2019 CLINICAL DATA:  Low back pain, generalized abdominal pain EXAM: CT ABDOMEN AND PELVIS WITHOUT CONTRAST TECHNIQUE: Multidetector CT imaging of  the abdomen and pelvis was performed following the standard protocol without IV contrast. COMPARISON:  08/31/2018 FINDINGS: Lower chest: small bilateral pleural effusions, new since prior study. Small nodules in the right lower lobe are stable. Bibasilar atelectasis. Heart is normal size. Mediastinal adenopathy partially imaged on the upper images as seen on prior study. Heart is normal size. Hepatobiliary: No focal liver abnormality is seen. Status post cholecystectomy. No biliary dilatation. Pancreas: No focal abnormality or ductal dilatation. Spleen: Spleen mildly enlarged with a craniocaudal length of 13.9 cm. This is stable when compared to prior study. No focal splenic abnormality. Adrenals/Urinary Tract: Left renal cyst measures 6 cm. No hydronephrosis. Adrenal glands and urinary bladder unremarkable. Stomach/Bowel: Appendix normal. Stomach, large and small bowel grossly unremarkable. Vascular/Lymphatic: Aortic atherosclerosis. No aneurysm. Extensive retroperitoneal adenopathy. This continues into the pelvis/iliac chains. This is also noted in the retrocrural region. This is unchanged since prior study. Reproductive: Mild prostate prominence. Other: No free fluid or  free air. There is extensive stranding/soft tissue density noted in the upper retroperitoneum around the adrenal glands. This has progressed significantly since prior study and is of unknown etiology. Musculoskeletal: Lytic lesion within the T7 vertebral body is stable. Lytic lesion within the L2 vertebral body also stable. IMPRESSION: Extensive retroperitoneal adenopathy in the abdomen and pelvis. Adenopathy in the visualized lower mediastinum. This is unchanged since prior study and could reflect lymphoma or metastatic disease. Extensive stranding in the retroperitoneum in the upper abdomen around the adrenal glands. This is of unknown etiology, progressed since prior study. Small bilateral pleural effusions, new since prior study. Stable small  lower lobe pulmonary nodules. Bibasilar atelectasis. Splenomegaly, stable. Aortic atherosclerosis. Stable lytic lesions in the T7 and L2 vertebral bodies. Electronically Signed   By: Rolm Baptise M.D.   On: 05/25/2019 00:04    Lab Data:  CBC: Recent Labs  Lab 05/22/2019 2057 05/25/19 0109 05/26/19 0327 05/30/19 0339  WBC 6.9 7.8 6.2 7.2  HGB 11.9* 12.0* 11.6* 11.5*  HCT 38.3* 38.4* 37.8* 36.3*  MCV 91.4 92.1 91.7 91.2  PLT 150 153 153 503   Basic Metabolic Panel: Recent Labs  Lab 05/26/19 0327 05/27/19 0409 05/28/19 0425 05/29/19 0335 05/30/19 0339  NA 130* 130* 129* 128* 127*  K 4.2 4.4 4.7 5.1 5.5*  CL 93* 91* 90* 90* 90*  CO2 27 30 30 31 29   GLUCOSE 122* 129* 129* 122* 125*  BUN 29* 31* 34* 36* 39*  CREATININE 1.49* 1.34* 1.52* 1.52* 1.59*  CALCIUM 8.7* 8.9 9.0 9.1 9.1   GFR: Estimated Creatinine Clearance: 63.5 mL/min (A) (by C-G formula based on SCr of 1.59 mg/dL (H)). Liver Function Tests: Recent Labs  Lab 06/07/2019 2057 05/25/19 0109  AST 32 32  ALT 19 19  ALKPHOS 114 109  BILITOT 1.1 1.5*  PROT 5.6* 5.6*  ALBUMIN 3.1* 3.3*   Recent Labs  Lab 06/08/2019 2057  LIPASE 55*   No results for input(s): AMMONIA in the last 168 hours. Coagulation Profile: No results for input(s): INR, PROTIME in the last 168 hours. Cardiac Enzymes: No results for input(s): CKTOTAL, CKMB, CKMBINDEX, TROPONINI in the last 168 hours. BNP (last 3 results) No results for input(s): PROBNP in the last 8760 hours. HbA1C: No results for input(s): HGBA1C in the last 72 hours. CBG: Recent Labs  Lab 05/29/19 1205 05/29/19 1716 05/29/19 2131 05/30/19 0731 05/30/19 1209  GLUCAP 139* 126* 155* 136* 117*   Lipid Profile: No results for input(s): CHOL, HDL, LDLCALC, TRIG, CHOLHDL, LDLDIRECT in the last 72 hours. Thyroid Function Tests: No results for input(s): TSH, T4TOTAL, FREET4, T3FREE, THYROIDAB in the last 72 hours. Anemia Panel: No results for input(s): VITAMINB12, FOLATE,  FERRITIN, TIBC, IRON, RETICCTPCT in the last 72 hours. Urine analysis:    Component Value Date/Time   COLORURINE YELLOW 06/15/2019 2212   APPEARANCEUR HAZY (A) 05/23/2019 2212   LABSPEC 1.017 06/19/2019 2212   PHURINE 5.0 06/05/2019 2212   GLUCOSEU NEGATIVE 06/09/2019 2212   HGBUR NEGATIVE 06/17/2019 2212   BILIRUBINUR NEGATIVE 06/18/2019 2212   KETONESUR NEGATIVE 06/19/2019 2212   PROTEINUR NEGATIVE 06/17/2019 2212   UROBILINOGEN 1.0 02/20/2014 1809   NITRITE NEGATIVE 06/01/2019 2212   LEUKOCYTESUR NEGATIVE 06/10/2019 2212     Necia Kamm M.D. [facility] 05/30/2019, 12:52 PM  Pager: [phone] Between 7am to 7pm - call Pager - [phone]  After 7pm go to [email or web address] - password [email or web address]  Call night coverage person covering after 7pm

## 2019-05-30 NOTE — Progress Notes (Signed)
Off unit for radiation

## 2019-05-30 NOTE — Progress Notes (Signed)
[facility] Therapy Treatment Record Phone [phone]   Radiation Therapy was administered to Shane Watson on: 05/30/2019  12:44 PM and was treatment # 2 out of a planned course of 10 treatments.  Radiation Treatment  1). Beam photons with 6-10 energy  2). Brachytherapy None  3). Stereotactic Radiosurgery None  4). Other Radiation None     Shane Watson A Chrstopher Malenfant, RT (T)

## 2019-05-30 NOTE — Telephone Encounter (Signed)
Call returned to Ragan with [facility]PS, she states the pharmacy does not have albuterol 0.5%. She states their pharmacy only fills the 0.083%. She is wanting to know if we can send in the albuterol 0.083% instead of the 0.5%.   MW is it okay to switch the albuterol 0.5% to 0.083% every 4-6 hours prn. Thanks.

## 2019-05-30 NOTE — Progress Notes (Signed)
Marland Kitchen   HEMATOLOGY/ONCOLOGY INPATIENT PROGRESS NOTE  Date of Service: 05/30/2019  Inpatient Attending: .Mendel Corning, MD   SUBJECTIVE  Mr. Shane Watson was seen in inpatient oncology consultation follow-up.  His son Shane Watson was at bedside.  Patient was initially noted to be very drowsy and somnolent after having received pain medications earlier in the day.  Took significant effort and putting him back on his CPAP machine to wake him up. Dr. Domingo Cocking from palliative care also arrived during our goals of care discussion. I filled in Mr. Holmer's son based on his consent and shared all his medical information and went over his labs CT findings and pathology results. 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 Patient currently has a performance status of 3-4 and is significantly limited by his back pain and by his multiple medical comorbidities that even prior to his diagnosis of current malignancy were limiting his functional status.  We discussed that this is not a curable condition and that the patient does not have a good performance status to be able to easily tolerate or derive significant benefit from palliative chemotherapy.  We discussed 2 broad approaches that we might take in this situation in general with best supportive cares through hospice being preferred approach versus considering additional testing including tissue for gene analysis and consideration of palliative chemotherapy.  However his performance status and significant burden of medical comorbidities would not make him the best candidate for palliative chemotherapy.  He has been seen by the radiation oncology team and has been offered palliative radiation for his spinal metastases for symptom control.  Dr. Domingo Cocking simultaneously also explained the role of  palliative/hospice services and help to try to elucidate his the goals of care further.  Patient seems to have significant difficulty in making this difficult decision.  All of the sounds multiple questions were answered in great details to his apparent satisfaction. Patient requested more time to digest all the information and come to a personal decision.  He and his son are aware that symptom management might be quite challenging in his case due to significant somnolence and difficulty managing narcotics in light of his significant sleep apnea. He was recommended to use his CPAP machine every time he decides to sleep.   OBJECTIVE:  No acute distress, fatigued appearing  PHYSICAL EXAMINATION: . Vitals:   05/29/19 2025 05/29/19 2134 05/30/19 0448 05/30/19 0838  BP:  (!) 141/51 (!) 140/50   Pulse: 72 70 76   Resp: 20 18 18    Temp:  98 F (36.7 C) 98.7 F (37.1 C)   TempSrc:   Oral   SpO2: 96% 97% 98% 90%  Weight:   (!) 358 lb 3.2 oz (162.5 kg)   Height:       Filed Weights   05/28/19 0500 05/29/19 0500 05/30/19 0448  Weight: (!) 355 lb 11.2 oz (161.3 kg) (!) 354 lb 12.8 oz (160.9 kg) (!) 358 lb 3.2 oz (162.5 kg)   .Body mass index is 49.96 kg/m.  GENERAL: Initially very somnolent but then awakened with significant effort and after using CPAP. SKIN: No acute rashes  OROPHARYNX:no exudate NECK: supple LYMPH:  no palpable lymphadenopathy in the cervical, axillary or inguinal LUNGS: clear to auscultation with normal respiratory effort HEART: regular rate & rhythm,  no murmurs and in bilateral 2+ lower extremity edema ABDOMEN: abdomen soft, non-tender, normoactive bowel sounds  Musculoskeletal: no cyanosis of digits and no clubbing  PSYCH: Initially very somnolent and difficult to arouse and then was awake and able to converse . NEURO: no focal motor/sensory deficits  MEDICAL HISTORY:  Past Medical History:  Diagnosis Date   Allergy    Anemia    Arthritis    BPH  (benign prostatic hyperplasia)    CKD (chronic kidney disease), stage III    Complication of anesthesia    COPD (chronic obstructive pulmonary disease) ([place])    a. Uses Home O2 w/ exertion.   Coronary artery disease    a. 11/2010 Cath: LM nl, LAD 42m (3.5x22 and 3.5x12 Resolute DES'), LCX nl, OM1/2/3 nl, RCA 20-30p, PDA nl.    Diabetes mellitus without complication (HCC)    GERD (gastroesophageal reflux disease)    H/O echocardiogram    a. per notes in 11/2010 - nl EF, basal inf wma.   Heart murmur    History of kidney stones    Hyperlipidemia    Hypertensive heart disease    Hyperthyroidism    Neuropathy    Neuropathy    OSA (obstructive sleep apnea)    a. Uses CPAP.   PONV (postoperative nausea and vomiting)    Vertigo     SURGICAL HISTORY: Past Surgical History:  Procedure Laterality Date   2 heart stents  12/18/10   CARDIAC CATHETERIZATION     CHOLECYSTECTOMY     COLONOSCOPY     REVERSE SHOULDER ARTHROPLASTY Right 10/08/2015   REVERSE SHOULDER ARTHROPLASTY Right 10/08/2015   Procedure: REVERSE SHOULDER ARTHROPLASTY;  Surgeon: Netta Cedars, MD;  Location: [facility];  Service: Orthopedics;  Laterality: Right;   right knee surgery Right 2008ish   "scraped it out"    SOCIAL HISTORY: Social History   Socioeconomic History   Marital status: Single    Spouse name: Not on file   Number of children: Not on file   Years of education: Not on file   Highest education level: Not on file  Occupational History   Occupation: Retired    Comment: Merchandiser, retail strain: Not on file   Food insecurity    Worry: Not on file    Inability: Not on file   Transportation needs    Medical: Not on file    Non-medical: Not on file  Tobacco Use   Smoking status: Former Smoker    Packs/day: 1.50    Years: 15.00    Pack years: 22.50    Types: Cigarettes    Quit date: 04/29/1987    Years since quitting: 32.1   Smokeless  tobacco: Never Used  Substance and Sexual Activity   Alcohol use: Yes    Comment: Rare   Drug use: No   Sexual activity: Not on file  Lifestyle   Physical activity    Days per week: Not on file    Minutes per session: Not on file   Stress: Not on file  Relationships   Social connections    Talks on phone: Not on file    Gets together: Not on file    Attends religious service: Not on file    Active member of club or organization: Not on file    Attends meetings of clubs or organizations: Not on file    Relationship status: Not on file   Intimate partner violence    Fear of current or ex partner: Not on file    Emotionally abused: Not on file  Physically abused: Not on file    Forced sexual activity: Not on file  Other Topics Concern   Not on file  Social History Narrative   Not on file    FAMILY HISTORY: Family History  Problem Relation Age of Onset   Diabetes Mother    Heart disease Father    Diabetes Father    Heart disease Sister    Diabetes Sister     ALLERGIES:  is allergic to aspirin; ibuprofen; penicillins; and sulfa antibiotics.  MEDICATIONS:  Scheduled Meds:  aspirin EC  81 mg Oral Daily   atorvastatin  40 mg Oral Daily   colesevelam  625 mg Oral BID WC   diltiazem  90 mg Oral Daily   enoxaparin (LOVENOX) injection  80 mg Subcutaneous Q24H   famotidine  20 mg Oral QHS   furosemide  40 mg Intravenous Daily   gabapentin  300 mg Oral BID   insulin aspart  0-15 Units Subcutaneous TID WC   insulin aspart  0-5 Units Subcutaneous QHS   ipratropium-albuterol  3 mL Nebulization TID   lidocaine  3 patch Transdermal Q24H   linagliptin  5 mg Oral Daily   loratadine  10 mg Oral Daily   magnesium oxide  400 mg Oral Daily   meclizine  25 mg Oral Daily   mometasone-formoterol  2 puff Inhalation BID   pantoprazole  40 mg Oral Daily   polyethylene glycol  17 g Oral Daily   Continuous Infusions: PRN Meds:.acetaminophen **OR**  acetaminophen, albuterol, diphenoxylate-atropine, hydrALAZINE, HYDROmorphone (DILAUDID) injection, naLOXone (NARCAN)  injection, ondansetron (ZOFRAN) IV, oxybutynin, polyvinyl alcohol  REVIEW OF SYSTEMS:    10 Point review of Systems was done is negative except as noted above.   LABORATORY DATA:  I have reviewed the data as listed  . CBC Latest Ref Rng & Units 05/30/2019 05/26/2019 05/25/2019  WBC 4.0 - 10.5 K/uL 7.2 6.2 7.8  Hemoglobin 13.0 - 17.0 g/dL 11.5(L) 11.6(L) 12.0(L)  Hematocrit 39.0 - 52.0 % 36.3(L) 37.8(L) 38.4(L)  Platelets 150 - 400 K/uL 161 153 153    . CMP Latest Ref Rng & Units 05/30/2019 05/29/2019 05/28/2019  Glucose 70 - 99 mg/dL 125(H) 122(H) 129(H)  BUN 8 - 23 mg/dL 39(H) 36(H) 34(H)  Creatinine 0.61 - 1.24 mg/dL 1.59(H) 1.52(H) 1.52(H)  Sodium 135 - 145 mmol/L 127(L) 128(L) 129(L)  Potassium 3.5 - 5.1 mmol/L 5.5(H) 5.1 4.7  Chloride 98 - 111 mmol/L 90(L) 90(L) 90(L)  CO2 22 - 32 mmol/L 29 31 30   Calcium 8.9 - 10.3 mg/dL 9.1 9.1 9.0  Total Protein 6.5 - 8.1 g/dL - - -  Total Bilirubin 0.3 - 1.2 mg/dL - - -  Alkaline Phos 38 - 126 U/L - - -  AST 15 - 41 U/L - - -  ALT 0 - 44 U/L - - -   Component     Latest Ref Rng & Units 05/25/2019 05/26/2019  LDH     98 - 192 U/L 209 (H)   CEA     0.0 - 4.7 ng/mL 1.9   Prostatic Specific Antigen     0.00 - 4.00 ng/mL 2.61   AFP, Serum, Tumor Marker     0.0 - 8.3 ng/mL  1.1  hCG Quant     0 - 3 mIU/mL  1   SURGICAL PATHOLOGY  CASE: [id number]  PATIENT: Melissa Noon  Surgical Pathology Report      Clinical History: concern for lymphoma (cm)  FINAL MICROSCOPIC DIAGNOSIS:   A. LYMPH NODE, LEFT RETROPERITONEAL, NEEDLE CORE BIOPSY:  - Poorly differentiated adenocarcinoma. See comment  - Lymphoid tissue is not identified    COMMENT:   Immunohistochemical stains show following pattern of staining   Positive: Pancytokeratin (AE1/AE3), CK20 (rare cells - possibly  nonspecific)  Negative: CK7,  CK5/6, TTF-1, CDX2, CD45, PSA, prostein, GATA3,  calretinin and WT1   Mucicarmine special stain is positive. This staining profile is  nonspecific and consistent with above interpretation. Dr. Saralyn Pilar and  Dr. Melina Copa have reviewed the case and concur with the above diagnosis.  Dr. Kenton Kingfisher was notified on 05/17/2019.    RADIOGRAPHIC STUDIES: I have personally reviewed the radiological images as listed and agreed with the findings in the report. Ct Abdomen Pelvis W Contrast  Result Date: 05/01/2019 CLINICAL DATA:  2 week history of right upper quadrant pain. EXAM: CT ABDOMEN AND PELVIS WITH CONTRAST TECHNIQUE: Multidetector CT imaging of the abdomen and pelvis was performed using the standard protocol following bolus administration of intravenous contrast. CONTRAST:  133mL ISOVUE-300 IOPAMIDOL (ISOVUE-300) INJECTION 61% COMPARISON:  01/17/2018 FINDINGS: Lower chest: Tiny pulmonary nodules in the posterior right lung base measuring 6 mm on images 1 and 8 of series 3 are stable in the interval, likely benign. Atelectasis or scarring is noted in the lung bases bilaterally. Hepatobiliary: No suspicious focal abnormality within the liver parenchyma. Gallbladder surgically absent. No intrahepatic or extrahepatic biliary dilation. Pancreas: No focal mass lesion. No dilatation of the main duct. No intraparenchymal cyst. No peripancreatic edema. Spleen: No splenomegaly. No focal mass lesion. Adrenals/Urinary Tract: No adrenal nodule or mass. Right kidney unremarkable. Similar appearance of a 6.5 cm interpolar left renal cyst. Smaller low-density 14 mm interpolar left renal lesion is also stable. No evidence for hydronephrosis. No evidence for hydroureter. Bladder is decompressed. Stomach/Bowel: Stomach is unremarkable. No gastric wall thickening. No evidence of outlet obstruction. Duodenum is normally positioned as is the ligament of Treitz. No small bowel wall thickening. No small bowel dilatation. The terminal  ileum is normal. The appendix is not visualized, but there is no edema or inflammation in the region of the cecum. No gross colonic mass. No colonic wall thickening. Vascular/Lymphatic: There is abdominal aortic atherosclerosis without aneurysm. Interval development of lymphadenopathy in the lower mediastinum and abdomen. 13 mm right retrocrural node seen on 12/02. 2.4 cm short axis portal caval node visible on 30/2. 2.3 cm short axis aortocaval node visible on 38/2. 2.8 cm left para-aortic node identified on 50/2. Small lymph nodes are seen in the common iliac chains bilaterally. 15 mm short axis left common femoral node visible on 77/2. Reproductive: Prostate gland is enlarged. Other: No intraperitoneal free fluid. Musculoskeletal: Right groin hernia contains only fat. Multiple new subtle lucent lesions are identified in the bony anatomy including a 13 mm lesion in the L1 vertebral body (32/2), 1.9 cm lesion in the L2 vertebral body (40/2), subtle cortical lesion in the right iliac crest (59/2) and 10 mm sacral lesion on 69/2. IMPRESSION: 1. Lower mediastinal and abdominal/pelvic lymphadenopathy. This could be related to lymphoma or metastatic disease, but given the interval development of multiple new lucent bone lesions, metastatic disease is favored. No evidence for a primary lesion on the current exam. 2.  Aortic Atherosclerois (ICD10-170.0) 3. Tiny pulmonary nodules in the right lung base are stable since prior study, suggesting benign etiology. These results will be called to the ordering clinician or representative by the Radiologist Assistant, and communication documented in the PACS  or zVision Dashboard. Electronically Signed   By: Misty Stanley M.D.   On: 05/01/2019 17:26   Nm Pet Image Initial (pi) Skull Base To Thigh  Result Date: 05/04/2019 CLINICAL DATA:  Initial staging, abdominal lymphadenopathy treatment strategy for abdominal lymphadenopathy. EXAM: NUCLEAR MEDICINE PET SKULL BASE TO THIGH  TECHNIQUE: Sixteen mCi F-18 FDG was injected intravenously. Full-ring PET imaging was performed from the skull base to thigh after the radiotracer. CT data was obtained and used for attenuation correction and anatomic localization. Fasting blood glucose: 113 mg/dl COMPARISON:  None. FINDINGS: Mediastinal blood pool activity: SUV max 1.8 Liver activity: SUV max NA NECK: No hypermetabolic lymph nodes in the neck. Incidental CT findings: none CHEST: Cluster prevascular lymph nodes. Example lymph node measures 0.6 cm prevascular space with SUV max 10.7. Nodes extend to the high LEFT paratracheal nodal station but no clear supraclavicular hypermetabolic nodes. No hypermetabolic axillary nodes Incidental CT findings: Small RIGHT pleural effusion. Mild RIGHT basilar atelectasis. Nodule along the RIGHT pleural surface measures 8 mm (image 99/8 without metabolic activity. Second nodule along the posterior aspect of the LEFT upper lobe against the fissure measures 6 mm (image 57/4 also without metabolic activity. ABDOMEN/PELVIS: Cluster hypermetabolic lymph nodes in the retroperitoneum along the aorta from the renal veins to the iliac arteries. Example LEFT aorta SUV max equal 6.2 measures 2 cm (image 143/4. Posterior the aorta SUV max equal 7.6. Noted within the mesentery on the SMA adjacent to the uncinate of the pancreas measures 2.2 cm SUV max 10.6. Smaller hypermetabolic nodes extend along the common iliac arteries. No hypermetabolic external iliac nodes or inguinal nodes. Spleen is normal metabolic activity normal volume. No abnormal activity in liver. Incidental CT findings: There is a lesion in the SKELETON: Along the posterior aspect of the T7 vertebral body there is a lytic lesion measuring 2 cm (image 80/4) which has mild metabolic activity (SUV max equal 6.0.) This lesion is new from CT September 22, 3380) Focal metabolic activity in the posterior LEFT iliac bone with SUV max equal 6.9. No clear CT abnormality at this  level. Lytic lesion in the L1 vertebral body with mild metabolic activity (SUV max equal 5.7. Incidental CT findings: none IMPRESSION: 1. Hypermetabolic mediastinal prevascular nodes and hypermetabolic retroperitoneal and mesenteric nodes consistent with lymphoma versus granulomatous disease (sarcoidosis). 2. Lytic lesion in the T7 vertebral body with moderate metabolic activity is concerning for skeletal metastasis versus sarcoid lesion. 3. Small pulmonary nodules do not have associated metabolic activity. Recommend attention on follow-up. 4. Unfortunately, there are no large hypermetabolic supraclavicular or inguinal/iliac nodes for biopsy. The hypermetabolic T7 lesion is represented well on CT. The hypermetabolic LEFT iliac bone lesion is not well depicted on CT. Electronically Signed   By: Suzy Bouchard M.D.   On: 05/04/2019 13:06   Ct Biopsy  Result Date: 05/11/2019 INDICATION: Retroperitoneal adenopathy, concern for lymphoproliferative disorder EXAM: CT-GUIDED BIOPSY LEFT RETROPERITONEAL BULKY ADENOPATHY MEDICATIONS: 1% lidocaine local ANESTHESIA/SEDATION: 0.5 mg IV Versed; 12.5 mcg IV Fentanyl Moderate Sedation Time:  13 minutes The patient was continuously monitored during the procedure by the interventional radiology nurse under my direct supervision. PROCEDURE: The procedure, risks, benefits, and alternatives were explained to the patient. Questions regarding the procedure were encouraged and answered. The patient understands and consents to the procedure. Previous imaging reviewed. Patient positioned prone. Noncontrast localization CT performed. The left bulky retroperitoneal periaortic adenopathy was localized and marked. Under sterile conditions and local anesthesia, a 17 gauge 16.8 cm guide was advanced  to the adenopathy. Needle position confirmed with CT. 18 gauge core biopsies obtained. Samples placed in saline. Needle removed. Postprocedure imaging demonstrates no hemorrhage or hematoma.  Patient tolerated the procedure well without complication. Vital sign monitoring by nursing staff during the procedure will continue as patient is in the special procedures unit for post procedure observation. FINDINGS: The images document guide needle placement within the left retroperitoneal periaortic adenopathy. Post biopsy images demonstrate no hemorrhage or hematoma. COMPLICATIONS: None immediate. IMPRESSION: Successful CT-guided core biopsy of the left retroperitoneal periaortic adenopathy Electronically Signed   By: [name].  Shick M.D.   On: 05/11/2019 14:24   Ct Renal Stone Study  Result Date: 05/25/2019 CLINICAL DATA:  Low back pain, generalized abdominal pain EXAM: CT ABDOMEN AND PELVIS WITHOUT CONTRAST TECHNIQUE: Multidetector CT imaging of the abdomen and pelvis was performed following the standard protocol without IV contrast. COMPARISON:  08/31/2018 FINDINGS: Lower chest: small bilateral pleural effusions, new since prior study. Small nodules in the right lower lobe are stable. Bibasilar atelectasis. Heart is normal size. Mediastinal adenopathy partially imaged on the upper images as seen on prior study. Heart is normal size. Hepatobiliary: No focal liver abnormality is seen. Status post cholecystectomy. No biliary dilatation. Pancreas: No focal abnormality or ductal dilatation. Spleen: Spleen mildly enlarged with a craniocaudal length of 13.9 cm. This is stable when compared to prior study. No focal splenic abnormality. Adrenals/Urinary Tract: Left renal cyst measures 6 cm. No hydronephrosis. Adrenal glands and urinary bladder unremarkable. Stomach/Bowel: Appendix normal. Stomach, large and small bowel grossly unremarkable. Vascular/Lymphatic: Aortic atherosclerosis. No aneurysm. Extensive retroperitoneal adenopathy. This continues into the pelvis/iliac chains. This is also noted in the retrocrural region. This is unchanged since prior study. Reproductive: Mild prostate prominence. Other: No free  fluid or free air. There is extensive stranding/soft tissue density noted in the upper retroperitoneum around the adrenal glands. This has progressed significantly since prior study and is of unknown etiology. Musculoskeletal: Lytic lesion within the T7 vertebral body is stable. Lytic lesion within the L2 vertebral body also stable. IMPRESSION: Extensive retroperitoneal adenopathy in the abdomen and pelvis. Adenopathy in the visualized lower mediastinum. This is unchanged since prior study and could reflect lymphoma or metastatic disease. Extensive stranding in the retroperitoneum in the upper abdomen around the adrenal glands. This is of unknown etiology, progressed since prior study. Small bilateral pleural effusions, new since prior study. Stable small lower lobe pulmonary nodules. Bibasilar atelectasis. Splenomegaly, stable. Aortic atherosclerosis. Stable lytic lesions in the T7 and L2 vertebral bodies. Electronically Signed   By: Rolm Baptise M.D.   On: 05/25/2019 00:04    ASSESSMENT & PLAN:   73 year old male with multiple medical comorbidities and an ECOG performance status of 3-4 with  1.  Metastatic poorly differentiated adenocarcinoma with unknown primary.  Discussed with pathology -- no definitive clues on morphology or IHC regarding site of primary tumor. Tumor is mucicarmine + --confirming adenocarcinoma.   PET/CT- no evident primary tumor. Has extensive disease and skeletal metastases. Patient with no overt focal symptoms to suggest a clear primary site of his tumor.  Most likely primary sites  GI tract vs urinary system   CEA WNL LDH - borderline +ve  2) Bone metastases Along the posterior aspect of the T7 vertebral body there is a lytic lesion measuring 2 cm (image 80/4) which has mild metabolic activity (SUV max equal 6.0.) This lesion is new from CT September 22, 2017)  PET scan on 05/04/2019 - 1. Hypermetabolic mediastinal prevascular nodes and  hypermetabolic retroperitoneal and mesenteric nodes consistent with lymphoma versus granulomatous disease (sarcoidosis). 2. Lytic lesion in the T7 vertebral body with moderate metabolic activity is concerning for skeletal metastasis versus sarcoid lesion. 3. Small pulmonary nodules do not have associated metabolic activity. Recommend attention on follow-up. 4. Unfortunately, there are no large hypermetabolic supraclavicular or inguinal/iliac nodes for biopsy. The hypermetabolic T7 lesion is represented well on CT. The hypermetabolic LEFT iliac bone lesion is not well depicted on CT.  3.  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 -I filled in Shane Watson's son based on his consent and shared all his medical information and went over his labs CT findings and pathology results. 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 Patient currently has a performance status of 3-4 and is significantly limited by his back pain and by his multiple medical comorbidities that even prior to his diagnosis of current malignancy were limiting his functional status.  We discussed that this is not a curable condition and that the patient does not have a good performance status to be able to easily tolerate or derive significant benefit from palliative chemotherapy.  We discussed 2 broad approaches that we might take in this situation in general with best supportive cares through hospice being preferred approach versus considering additional testing including tissue for gene analysis and consideration of palliative chemotherapy.  However his performance status and  significant burden of medical comorbidities would not make him a good candidate for palliative chemotherapy. He was made aware of the burden of care and f/u needed to pursue a palliative ctx approach.  He has been seen by the radiation oncology team and has been offered palliative radiation for his spinal metastases for symptom control.  Dr. Domingo Cocking simultaneously also explained the role of palliative/hospice services and help to try to elucidate his the goals of care further.  Patient seems to have significant difficulty in making this difficult decision.  All of the sounds multiple questions were answered in great details to his apparent satisfaction. Patient requested more time to digest all the information and come to a personal decision.  He and his son are aware that symptom management might be quite challenging in his case due to significant somnolence and difficulty managing narcotics in light of his significant sleep apnea. He was recommended to use his CPAP machine every time he decides to sleep.   3.  Acute on chronic CKD  -IV fluids per hospitalist. -Diuretics and ARB are currently on hold -Avoid nephrotoxic medications -Monitor renal function closely  4.  Diabetes with peripheral neuropathy   -On Tradjenta -CBGs are being monitored before meals and at bedtime with sliding scale insulin ordered  5.  CAD  -Currently on aspirin, Lipitor, WelChol, and diltiazem  6.  Hypertension  -Diuretics and ARB are currently on hold  7.  COPD  -On Symbicort and albuterol  8.  BPH  -On Cardura   I spent 40 minutes counseling the patient face to face. The total time spent in the appointment was 60 minutes and more than 50% was on counseling and direct patient cares and discussing goals of care    Sullivan Lone MD [place] AAHIVMS [facility] Hematology/Oncology Physician [place]  (Office):  [phone] (Work cell):  [phone] (Fax):            [phone]  05/30/2019 11:13 AM

## 2019-05-30 NOTE — Progress Notes (Signed)
Daily Progress Note   Patient Name: Shane Watson       Date: 05/30/2019 DOB: 11/25/1945  Age: 73 y.o. MRN#: [id number] Attending Physician: Mendel Corning, MD Primary Care Physician: Shirline Frees, MD Admit Date: 05/23/2019  Reason for Consultation/Follow-up: Establishing goals of care and Pain control  Subjective: I saw and examined Mr. Cavanaugh earlier this am. He was awake, alert. He took off his CPAP, he is asking for pain medication. He complains that his back and sides hurt. He recalls to some extent that an extensive conversation  was held yesterday evening with his son, med onc Dr. Irene Limbo and PMT MD Dr. Domingo Cocking. Patient's son is visiting him from [place] where he resides. Patient states that he continues to discuss with his son about next steps, based on options discussed yesterday.   He doesn't engage much today, states he just woke up and wants pain medication.   Med history noted, continue with IV Dilaudid low dose PRN for now, until further goals of care are clarified. Patient has been given 0.2 mg, 0.3 mg and one dose of 0.5 mg IV Dilaudid in the past 24 hours.    Length of Stay: 5  Current Medications: Scheduled Meds:  . aspirin EC  81 mg Oral Daily  . atorvastatin  40 mg Oral Daily  . colesevelam  625 mg Oral BID WC  . diltiazem  90 mg Oral Daily  . enoxaparin (LOVENOX) injection  80 mg Subcutaneous Q24H  . famotidine  20 mg Oral QHS  . furosemide  40 mg Intravenous Daily  . gabapentin  300 mg Oral BID  . insulin aspart  0-15 Units Subcutaneous TID WC  . insulin aspart  0-5 Units Subcutaneous QHS  . ipratropium-albuterol  3 mL Nebulization TID  . lidocaine  3 patch Transdermal Q24H  . linagliptin  5 mg Oral Daily  . loratadine  10 mg Oral Daily  . magnesium oxide   400 mg Oral Daily  . meclizine  25 mg Oral Daily  . mometasone-formoterol  2 puff Inhalation BID  . pantoprazole  40 mg Oral Daily  . polyethylene glycol  17 g Oral Daily    Continuous Infusions:   PRN Meds: acetaminophen **OR** acetaminophen, albuterol, diphenoxylate-atropine, hydrALAZINE, HYDROmorphone (DILAUDID) injection, naLOXone (NARCAN)  injection, ondansetron (ZOFRAN) IV, oxybutynin, polyvinyl  alcohol  Physical Exam         General: Alert, awake, in no acute distress.   HEENT: No bruits, no goiter, no JVD Heart: Regular rate and rhythm. No murmur appreciated. Lungs: Good air movement, clear Abdomen: Soft, nontender, nondistended, positive bowel sounds.  Obese. Skin: Warm and dry Has a lot of LE edema  Vital Signs: BP (!) 140/50 (BP Location: Left Arm)   Pulse 76   Temp 98.7 F (37.1 C) (Oral)   Resp 18   Ht 5\' 11"  (1.803 m)   Wt (!) 162.5 kg   SpO2 90%   BMI 49.96 kg/m  SpO2: SpO2: 90 % O2 Device: O2 Device: CPAP O2 Flow Rate: O2 Flow Rate (L/min): 2 L/min  Intake/output summary:   Intake/Output Summary (Last 24 hours) at 05/30/2019 1236 Last data filed at 05/30/2019 1130 Gross per 24 hour  Intake 240 ml  Output 360 ml  Net -120 ml   LBM: Last BM Date: 05/28/19 Baseline Weight: Weight: (!) 157.9 kg Most recent weight: Weight: (!) 162.5 kg       Palliative Assessment/Data:      Patient Active Problem List   Diagnosis Date Noted  . Adenocarcinoma, 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[place])   . Bone metastases ([place])   . Counseling regarding advance care planning and goals of care   . ARF (acute renal failure) ([place]) 05/25/2019  . Back pain 05/25/2019  . Diastolic CHF ([place]) 61/60/7371  . IBS (irritable bowel syndrome) 05/15/2019  . Acute bronchitis with COPD ([place]) 07/27/2018  . Abnormal CT scan of lung 03/30/2017  . Morbid obesity ([place]) 10/08/2015  . Lower extremity edema 10/08/2015  . S/P shoulder replacement 10/08/2015  . Fracture, humerus, proximal 10/07/2015  .  Coronary artery disease   . Hypertensive heart disease   . CKD (chronic kidney disease), stage III   . Hyperlipidemia   . COPD (chronic obstructive pulmonary disease) ([place])   . Morbid obesity due to excess calories ([place]) 02/25/2015  . Dyspnea 02/04/2015  . CKD (chronic kidney disease) 10/09/2014  . Coronary artery disease due to lipid rich plaque 10/09/2014  . Chronic respiratory failure with hypercapnia ([place]) 05/08/2014  . Cellulitis of left lower extremity 02/20/2014  . Leukocytosis, unspecified 02/20/2014  . COPD exacerbation ([place]) 02/20/2014  . Dyslipidemia 02/20/2014  . CKD (chronic kidney disease) stage 4, GFR 15-29 ml/min (HCC) 02/20/2014  . Multiple pulmonary nodules 11/01/2013  . Asthmatic bronchitis , chronic (HCC)   FEV1/FVC nl p bronchodilators  05/03/2013  . Obstructive sleep apnea 05/02/2013    Palliative Care Assessment & Plan   Patient Profile: 73 y.o. male  with past medical history of hypertension, hyperlipidemia, diabetes with peripheral neuropathy, CKD stage III, CAD, anemia, BPH admitted on 05/31/2019 with worsening back pain.  Earlier this month, patient had CT of abdomen pelvis that showed lower mediastinal and abdominal/pelvic adenopathy which could be related to lymphoma or metastatic disease.  There are also multiple lucent bone lesions concerning for metastatic disease.  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  Staining profile is nonspecific.  He is currently admitted for worsened back pain with plan to start radiation therapy on Monday.  Palliative consulted for pain management and goals of care.  Assessment: Patient Active Problem List   Diagnosis Date Noted  . 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 Bone metastases ([place])   . Counseling regarding advance care planning and goals of care   .  ARF (acute renal failure) ([place]) 05/25/2019  . Back  pain 05/25/2019  . Diastolic CHF ([place]) 32/35/5732  . IBS (irritable bowel syndrome) 05/15/2019  . Acute bronchitis with COPD ([place]) 07/27/2018  . Abnormal CT scan of lung 03/30/2017  . Morbid obesity ([place]) 10/08/2015  . Lower extremity edema 10/08/2015  . S/P shoulder replacement 10/08/2015  . Fracture, humerus, proximal 10/07/2015  . Coronary artery disease   . Hypertensive heart disease   . CKD (chronic kidney disease), stage III   . Hyperlipidemia   . COPD (chronic obstructive pulmonary disease) ([place])   . Morbid obesity due to excess calories ([place]) 02/25/2015  . Dyspnea 02/04/2015  . CKD (chronic kidney disease) 10/09/2014  . Coronary artery disease due to lipid rich plaque 10/09/2014  . Chronic respiratory failure with hypercapnia ([place]) 05/08/2014  . Cellulitis of left lower extremity 02/20/2014  . Leukocytosis, unspecified 02/20/2014  . COPD exacerbation ([place]) 02/20/2014  . Dyslipidemia 02/20/2014  . CKD (chronic kidney disease) stage 4, GFR 15-29 ml/min (HCC) 02/20/2014  . Multiple pulmonary nodules 11/01/2013  . Asthmatic bronchitis , chronic (HCC)   FEV1/FVC nl p bronchodilators  05/03/2013  . Obstructive sleep apnea 05/02/2013     Recommendations/Plan:  Continue IV Dilaudid PRN for now.  Patient wishes to discuss further with his son regarding goals of care. he has options of electing comfort care/hospice versus tissue diagnosis/palliative chemotherapy, how ever, options are limited due to low functional status and high symptom burden. Offered active listening and supportive presence, patient did not wish to engage much at the time of my visit.   Continue to recommend CPAP and supplemental O2.  Continue current mode of care.   Goals of Care and Additional Recommendations:  Limitations on Scope of Treatment: Full Scope Treatment  Code Status:    Code Status Orders  (From admission, onward)         Start     Ordered   05/25/19 0036  Full code  Continuous      05/25/19 0037        Code Status History    Date Active Date Inactive Code Status Order ID Comments User Context   10/07/2015 1856 10/10/2015 1352 Full Code [id number]  Kathrynn Speed Inpatient   02/20/2014 2021 02/23/2014 2012 Full Code [id number]  Theodis Blaze, MD Inpatient   Advance Care Planning Activity       Prognosis:   Guarded  Discharge Planning:  To Be Determined  Care plan was discussed with patient,   Thank you for allowing the Palliative Medicine Team to assist in the care of this patient.   Time In: 8 Time Out: 8.25 Total Time 25 Prolonged Time Billed  No      Greater than 50%  of this time was spent counseling and coordinating care related to the above assessment and plan.  Loistine Chance, MD  Please contact Palliative Medicine Team phone at [phone] for questions and concerns.

## 2019-05-31 ENCOUNTER — Ambulatory Visit
Admit: 2019-05-31 | Discharge: 2019-05-31 | Disposition: A | Discharge status: Home / Self Care | Payer: Medicare Other | Attending: Radiation Oncology | Admitting: Radiation Oncology

## 2019-05-31 ENCOUNTER — Inpatient Hospital Stay (HOSPITAL_COMMUNITY): Payer: Medicare Other

## 2019-05-31 DIAGNOSIS — C7951 Secondary malignant neoplasm of bone: Secondary | ICD-10-CM | POA: Diagnosis not present

## 2019-05-31 LAB — CBC
HCT: 39.4 % (ref 39.0–52.0)
Hemoglobin: 12.4 g/dL — ABNORMAL LOW (ref 13.0–17.0)
MCH: 28.8 pg (ref 26.0–34.0)
MCHC: 31.5 g/dL (ref 30.0–36.0)
MCV: 91.4 fL (ref 80.0–100.0)
Platelets: 164 10*3/uL (ref 150–400)
RBC: 4.31 MIL/uL (ref 4.22–5.81)
RDW: 16.7 % — ABNORMAL HIGH (ref 11.5–15.5)
WBC: 7.6 10*3/uL (ref 4.0–10.5)
nRBC: 0 % (ref 0.0–0.2)

## 2019-05-31 LAB — GLUCOSE, CAPILLARY
Glucose-Capillary: 168 mg/dL — ABNORMAL HIGH (ref 70–99)
Glucose-Capillary: 166 mg/dL — ABNORMAL HIGH (ref 70–99)
Glucose-Capillary: 131 mg/dL — ABNORMAL HIGH (ref 70–99)
Glucose-Capillary: 160 mg/dL — ABNORMAL HIGH (ref 70–99)

## 2019-05-31 LAB — BASIC METABOLIC PANEL
Anion gap: 10 (ref 5–15)
BUN: 44 mg/dL — ABNORMAL HIGH (ref 8–23)
CO2: 27 mmol/L (ref 22–32)
Calcium: 9.3 mg/dL (ref 8.9–10.3)
Chloride: 89 mmol/L — ABNORMAL LOW (ref 98–111)
Creatinine, Ser: 1.44 mg/dL — ABNORMAL HIGH (ref 0.61–1.24)
GFR calc Af Amer: 55 mL/min — ABNORMAL LOW (ref >60)
GFR calc non Af Amer: 47 mL/min — ABNORMAL LOW (ref >60)
Glucose, Bld: 123 mg/dL — ABNORMAL HIGH (ref 70–99)
Potassium: 5.4 mmol/L — ABNORMAL HIGH (ref 3.5–5.1)
Sodium: 126 mmol/L — ABNORMAL LOW (ref 135–145)

## 2019-05-31 MED ORDER — SODIUM CHLORIDE 0.9 % IV BOLUS
500.0000 mL | Freq: Once | INTRAVENOUS | Status: AC
  Administered 2019-05-31: 500 mL via INTRAVENOUS

## 2019-05-31 MED ORDER — IPRATROPIUM-ALBUTEROL 0.5-2.5 (3) MG/3ML IN SOLN
3.0000 mL | Freq: Four times a day (QID) | RESPIRATORY_TRACT | Status: DC
  Administered 2019-05-31 (×2): 3 mL via RESPIRATORY_TRACT
  Filled 2019-05-31 (×2): qty 3

## 2019-05-31 MED ORDER — OXYCODONE HCL 5 MG PO TABS
5.0000 mg | ORAL_TABLET | ORAL | Status: DC | PRN
  Administered 2019-06-01 – 2019-06-05 (×4): 5 mg via ORAL
  Filled 2019-05-31 (×5): qty 1

## 2019-05-31 MED ORDER — CHLORHEXIDINE GLUCONATE CLOTH 2 % EX PADS
6.0000 | MEDICATED_PAD | Freq: Every day | CUTANEOUS | Status: DC
  Administered 2019-05-31 – 2019-06-05 (×5): 6 via TOPICAL

## 2019-05-31 MED ORDER — FUROSEMIDE 10 MG/ML IJ SOLN
60.0000 mg | Freq: Once | INTRAMUSCULAR | Status: AC
  Administered 2019-05-31: 60 mg via INTRAVENOUS
  Filled 2019-05-31: qty 6

## 2019-05-31 MED ORDER — FUROSEMIDE 10 MG/ML IJ SOLN: 40.0000 mg | Freq: Two times a day (BID) | INTRAMUSCULAR | Status: DC

## 2019-05-31 NOTE — Progress Notes (Signed)
Daily Progress Note   Patient Name: Shane Watson       Date: 05/31/2019 DOB: 1946-05-07  Age: 73 y.o. MRN#: [id number] Attending Physician: Harold Hedge, MD Primary Care Physician: Shirline Frees, MD Admit Date: 06/10/2019  Reason for Consultation/Follow-up: Establishing goals of care and Pain control  Subjective: Mr. Shane Watson is awake and alert sitting in a chair.  He rested well overnight.  He has been requiring IV Dilaudid for the pain in his back and sides.  He is completely alert and oriented able to answer questions appropriately and participate in conversations.  He discussed with his son yesterday evening.  His son is visiting him from [place].  Patient states that he has thought about his options and has reached a decision.  He states he wants to go in peace and asked me if I understand what that means.  We discussed about full code versus DO NOT RESUSCITATE/DO NOT INTUBATE.  We discussed about various disposition options. See below.   Length of Stay: 6  Current Medications: Scheduled Meds:  . aspirin EC  81 mg Oral Daily  . atorvastatin  40 mg Oral Daily  . colesevelam  625 mg Oral BID WC  . diltiazem  90 mg Oral Daily  . enoxaparin (LOVENOX) injection  80 mg Subcutaneous Q24H  . famotidine  20 mg Oral QHS  . gabapentin  300 mg Oral BID  . insulin aspart  0-15 Units Subcutaneous TID WC  . insulin aspart  0-5 Units Subcutaneous QHS  . ipratropium-albuterol  3 mL Nebulization TID  . lidocaine  3 patch Transdermal Q24H  . linagliptin  5 mg Oral Daily  . loratadine  10 mg Oral Daily  . magnesium oxide  400 mg Oral Daily  . meclizine  25 mg Oral Daily  . mometasone-formoterol  2 puff Inhalation BID  . pantoprazole  40 mg Oral Daily  . polyethylene glycol  17 g Oral Daily    Continuous Infusions:   PRN Meds: acetaminophen **OR** acetaminophen, albuterol, diphenoxylate-atropine, hydrALAZINE, HYDROmorphone (DILAUDID) injection, naLOXone (NARCAN)  injection, ondansetron (ZOFRAN) IV, oxybutynin, polyvinyl alcohol  Physical Exam         General: Alert, awake, in no acute distress.  sitting up in chair this morning.  HEENT: No bruits, no goiter, no JVD Heart: Regular rate and  rhythm. No murmur appreciated. Lungs: Good air movement, clear Abdomen: Soft, nontender, nondistended, positive bowel sounds.  Obese. Skin: Warm and dry Has a lot of LE edema Awake alert oriented  Vital Signs: BP (!) 150/65 (BP Location: Right Arm)   Pulse 65   Temp 98.7 F (37.1 C) (Oral)   Resp 18   Ht 5\' 11"  (1.803 m)   Wt (!) 161 kg   SpO2 93%   BMI 49.50 kg/m  SpO2: SpO2: 93 % O2 Device: O2 Device: Nasal Cannula O2 Flow Rate: O2 Flow Rate (L/min): 3 L/min  Intake/output summary:   Intake/Output Summary (Last 24 hours) at 05/31/2019 1031 Last data filed at 05/31/2019 0951 Gross per 24 hour  Intake 480 ml  Output 500 ml  Net -20 ml   LBM: Last BM Date: 05/31/19 Baseline Weight: Weight: (!) 157.9 kg Most recent weight: Weight: (!) 161 kg       Palliative Assessment/Data:      Patient Active Problem List   Diagnosis Date Noted  . 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 Bone metastases ([place])   . Counseling regarding advance care planning and goals of care   . ARF (acute renal failure) ([place]) 05/25/2019  . Back pain 05/25/2019  . Diastolic CHF ([place]) 16/04/9603  . IBS (irritable bowel syndrome) 05/15/2019  . Acute bronchitis with COPD ([place]) 07/27/2018  . Abnormal CT scan of lung 03/30/2017  . Morbid obesity ([place]) 10/08/2015  . Lower extremity edema 10/08/2015  . S/P shoulder replacement 10/08/2015  . Fracture, humerus, proximal 10/07/2015  . Coronary artery disease   . Hypertensive heart disease   . CKD (chronic kidney disease), stage III   .  Hyperlipidemia   . COPD (chronic obstructive pulmonary disease) ([place])   . Morbid obesity due to excess calories ([place]) 02/25/2015  . Dyspnea 02/04/2015  . CKD (chronic kidney disease) 10/09/2014  . Coronary artery disease due to lipid rich plaque 10/09/2014  . Chronic respiratory failure with hypercapnia ([place]) 05/08/2014  . Cellulitis of left lower extremity 02/20/2014  . Leukocytosis, unspecified 02/20/2014  . COPD exacerbation ([place]) 02/20/2014  . Dyslipidemia 02/20/2014  . CKD (chronic kidney disease) stage 4, GFR 15-29 ml/min (HCC) 02/20/2014  . Multiple pulmonary nodules 11/01/2013  . Asthmatic bronchitis , chronic (HCC)   FEV1/FVC nl p bronchodilators  05/03/2013  . Obstructive sleep apnea 05/02/2013    Palliative Care Assessment & Plan   Patient Profile: 73 y.o. male  with past medical history of hypertension, hyperlipidemia, diabetes with peripheral neuropathy, CKD stage III, CAD, anemia, BPH admitted on 06/09/2019 with worsening back pain.  Earlier this month, patient had CT of abdomen pelvis that showed lower mediastinal and abdominal/pelvic adenopathy which could be related to lymphoma or metastatic disease.  There are also multiple lucent bone lesions concerning for metastatic disease.  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  Staining profile is nonspecific.  He is currently admitted for worsened back pain with plan to start radiation therapy on Monday.  Palliative consulted for pain management and goals of care.  Assessment: Patient Active Problem List   Diagnosis Date Noted  . 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 Bone metastases ([place])   . Counseling regarding advance care planning and goals of care   . ARF (acute renal failure) ([place]) 05/25/2019  . Back pain 05/25/2019  . Diastolic CHF ([place]) 54/03/8118  . IBS (irritable bowel syndrome) 05/15/2019  .  Acute  bronchitis with COPD ([place]) 07/27/2018  . Abnormal CT scan of lung 03/30/2017  . Morbid obesity ([place]) 10/08/2015  . Lower extremity edema 10/08/2015  . S/P shoulder replacement 10/08/2015  . Fracture, humerus, proximal 10/07/2015  . Coronary artery disease   . Hypertensive heart disease   . CKD (chronic kidney disease), stage III   . Hyperlipidemia   . COPD (chronic obstructive pulmonary disease) ([place])   . Morbid obesity due to excess calories ([place]) 02/25/2015  . Dyspnea 02/04/2015  . CKD (chronic kidney disease) 10/09/2014  . Coronary artery disease due to lipid rich plaque 10/09/2014  . Chronic respiratory failure with hypercapnia ([place]) 05/08/2014  . Cellulitis of left lower extremity 02/20/2014  . Leukocytosis, unspecified 02/20/2014  . COPD exacerbation ([place]) 02/20/2014  . Dyslipidemia 02/20/2014  . CKD (chronic kidney disease) stage 4, GFR 15-29 ml/min (HCC) 02/20/2014  . Multiple pulmonary nodules 11/01/2013  . Asthmatic bronchitis , chronic (HCC)   FEV1/FVC nl p bronchodilators  05/03/2013  . Obstructive sleep apnea 05/02/2013     Discussions/Recommendations/Plan:  Continue IV Dilaudid PRN for now. Will Add PRN PO Oxy IR and monitor. Would not start any long acting opioids yet.   PT consult  SNF rehab with palliative, complete radiation, and then likely home with hospice, if it can be arranged. Discussed with son on phone and also with patient in person in detail.   DNR DNI, also discussed in detail with both son and patient.   Patient elects to not undergo tissue diagnosis/palliative chemotherapy. He states that he wants to "go in peace" and asks if I understand what that means. Hence, we reviewed about how best to maintain/preserve his current function, manage symptoms and have a plan for safe disposition.   Call placed and discussed with son about all of the above.  He is hoping to get in touch with social work about questions pertaining to skilled nursing facility  rehabilitation attempt.  Patient has a significant other who lives close to him in [place], [place].  Patient wishes to discuss if after rehab attempt, it would be possible to arrange for home with hospice.  Currently patient was living alone in his apartment.  Patient wishes to complete his radiation treatments and understands that they are palliative in nature.  Recommend physical therapy consultation.  Discussed about pain management options with the patient's son as well over the phone.  All of his questions and concerns addressed to the best of my ability.    Goals of Care and Additional Recommendations:  Limitations on Scope of Treatment: Full Scope Treatment  Code Status:    Code Status Orders  (From admission, onward)         Start     Ordered   05/25/19 0036  Full code  Continuous     05/25/19 0037        Code Status History    Date Active Date Inactive Code Status Order ID Comments User Context   10/07/2015 1856 10/10/2015 1352 Full Code [id number]  Kathrynn Speed Inpatient   02/20/2014 2021 02/23/2014 2012 Full Code [id number]  Theodis Blaze, MD Inpatient   Advance Care Planning Activity       Prognosis:   Guarded  Discharge Planning:  To Be Determined  Care plan was discussed with patient, son on the phone.   Thank you for allowing the Palliative Medicine Team to assist in the care of this patient.   Time In: 10 Time Out:  10.35 Total Time 35 Prolonged Time Billed  No      Greater than 50%  of this time was spent counseling and coordinating care related to the above assessment and plan.  Loistine Chance, MD  Please contact Palliative Medicine Team phone at [phone] for questions and concerns.

## 2019-05-31 NOTE — Progress Notes (Signed)
Off unit for radiation

## 2019-05-31 NOTE — Progress Notes (Signed)
[facility] Therapy Treatment Record Phone [phone]   Radiation Therapy was administered to Shane Watson on: 05/31/2019  12:26 PM and was treatment # 3 out of a planned course of 10 treatments.  Radiation Treatment  1). Beam photons with 6-10 energy  2). Brachytherapy None  3). Stereotactic Radiosurgery None  4). Other Radiation None     Finnegan Gatta A Clayten Allcock, RT (T)

## 2019-05-31 NOTE — Progress Notes (Signed)
PROGRESS NOTE    Shane Watson    Code Status: DNR  JME:[id number] DOB: 06-13-1946 DOA: 05/31/2019  PCP: Shirline Frees, MD    Hospital Summary   73 year old male with history of hypertension, chronic back pain/neuropathy, BPH, CKD stage III, drug-eluting stent 12/18/2010, COPD, CAD, diabetes mellitus, GERD, obstructive sleep apnea on CPAP evaluated by PCPfew weeks back forback pain/abdominal pain, underwent outpatient work upwithCT scan of the abdomen pelvis on 10/12showinglower mediastinal and abdominal/pelvic lymphadenopathy related to lymphoma or metastatic disease as well astiny pulmonary nodules in the right lung base which were stable since the prior study->PET scan performed on 05/04/2019. PET scan results showed hypermetabolic mediastinal prevascular nodes and hypermetabolic retroperitoneal and mesenteric nodes consistent with lymphoma versus granulomatous disease (sarcoidosis),lytic lesion in the T7 vertebral body with moderate metabolic activity concerning for skeletal metastasis versus sarcoid lesion, small pulmonary nodules do not have associated metabolic activity-->patient underwent CT-guided retroperitoneal lymph node biopsy by IR on 10/22and postprocedure complained of pain radiating to his groin-NSAIDs were recommended. Pathology results indicate undifferentiated adenocarcinoma-->He was referred for oncology evaluation and was scheduled to seeDr Irene Limbo on 11/10.  Patient had worsening back pain and presented to Urgent care on11/2and prescribed 5mg  Oxycodone-1 tab was not enough and 2 tabs were making him too drowsy. Patientwas alsoseen by his primary pulmonologistrecently as outpatient for chest/abdominal pain complaints- felt to havesubdiaphragmatic pain/bloating and dietary changes were advised. Last colonoscopy was 3 years back.  [facility] complaints of persistentbackpainradiating to left anterior abdomen.He also reports  worsening lower extremity edema.   He has since been evaluated by palliative care for goals of care pain management and has underwent 3/10 radiation treatments.  A & P   Principal Problem:   ARF (acute renal failure) (HCC) Active Problems:   CKD (chronic kidney disease) stage 4, GFR 15-29 ml/min (HCC)   Coronary artery disease due to lipid rich plaque   COPD (chronic obstructive pulmonary disease) (HCC)   Back pain   Diastolic CHF (HCC)   Adenocarcinoma, 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HCC)   Bone metastases ([place])   Counseling regarding advance care planning and goals of care   Intractable back pain secondary to pleuritic bony lesions metastatic adenocarcinoma with unknown primary -Per palliative: Continue IV Dilaudid as needed, add as needed OxyIR and would not start long-acting opiates yet.  SNF rehab with palliative, complete radiation and then likely home with hospice. 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  AFP, CEA, hCG, PSA normal. Per Dr. Irene Limbo, oncology, this is incurable and possible options are palliative.  Started XRT 11/9, currently day 3/10 -Continue XRT -Continue following with palliative care  Diastolic heart failure exacerbation EF 50 to 55%.  Chest x-ray today with mild pulmonary vascular congestion bibasilar opacities concerning for worsening edema.  Weights trended up since admission 157.9 ->161 kg today.  Bilateral lower extremity edema.   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  Chest x-ray as above -Lasix 60 mg IV x1 -Increased frequency of nebulizers -Consider steroids if no improvement tomorrow  COPD exacerbation likely secondary to heart failure exacerbation afebrile without leukocytosis no signs of  infection.  Wheezing on exam. -DuoNeb every 6 hours -Albuterol neb every 4 hours as needed -Dulera twice daily -Diuresis as above  Hyponatremia  SIADH versus heart failure.  Sodium continues to downtrend 131 (admission)-> 126 today.  Has been on fluid restriction -IV Lasix -Continue to hold HCTZ  Type 2 diabetes stable -Continue sliding scale, Tradjenta  AKI on CKD 3 creatinine has plateaued at 1.4-1.5, likely baseline.  Without hydronephrosis or renal calculi on CT with extensive stranding in the retroperitoneum and upper abdomen around adrenal glands -Hold HCTZ and losartan  History of palpitations started on Cardizem 2019.  Stable -Continue Cardizem  CAD status post DES 2012  -Continue aspirin, statin  Hyperlipidemia -Continue Lipitor  Morbid obesity BMI 70 -Was counseled on diet weight control  GERD stable -Continue current management   DVT prophylaxis: Lovenox Diet: Heart healthy Family Communication: Patient's son has been updated at bedside Disposition Plan: Pending clinical stability and SNF rehab with palliative, complete radiation and then likely home with hospice  Consultants  Palliative Oncology Radiation oncology  Procedures  XRT x3      Subjective   Patient seen and examined sitting upright in chair with son at bedside.  He continued to fall asleep throughout our exam as he just received pain meds but was able to respond appropriately to questioning when prompted.  Currently stated that he has had worsening shortness of breath.  Admits that his low back pain has improved with current pain management.  Denies chest pain, nausea, vomiting, or any other complaints  Objective   Vitals:   05/31/19 0554 05/31/19 0801 05/31/19 0804 05/31/19 1318  BP:    140/65  Pulse:    (!) 118  Resp:    20  Temp:    98.7 F (37.1 C)  TempSrc:    Oral  SpO2:  93% 93% 93%  Weight: (!) 161 kg     Height:        Intake/Output Summary (Last 24 hours) at 05/31/2019  1522 Last data filed at 05/31/2019 1318 Gross per 24 hour  Intake 651.31 ml  Output 525 ml  Net 126.31 ml   Filed Weights   05/29/19 0500 05/30/19 0448 05/31/19 0554  Weight: (!) 160.9 kg (!) 162.5 kg (!) 161 kg    Examination:  Physical Exam Vitals signs and nursing note reviewed.  Constitutional:      Appearance: He is obese.  HENT:     Head: Normocephalic and atraumatic.     Nose: Nose normal.     Comments: Nasal canula     Mouth/Throat:     Mouth: Mucous membranes are moist.  Eyes:     Extraocular Movements: Extraocular movements intact.  Neck:     Musculoskeletal: Normal range of motion. No neck rigidity.  Cardiovascular:     Rate and Rhythm: Normal rate and regular rhythm.  Pulmonary:     Effort: Pulmonary effort is normal.     Breath sounds: Normal breath sounds.  Abdominal:     General: Abdomen is flat.     Palpations: Abdomen is soft.  Musculoskeletal:     Comments: Bilateral lower extremity 2-3+ pitting edema  Neurological:     Mental Status: He is alert and oriented to person, place, and time.     Comments: somnolent  Psychiatric:        Mood and Affect: Mood normal.        Thought Content: Thought content normal.     Data Reviewed: I have personally reviewed following labs and imaging studies  CBC: Recent Labs  Lab 05/27/2019 2057 05/25/19 0109 05/26/19 0327 05/30/19 0339 05/31/19 0357  WBC  6.9 7.8 6.2 7.2 7.6  HGB 11.9* 12.0* 11.6* 11.5* 12.4*  HCT 38.3* 38.4* 37.8* 36.3* 39.4  MCV 91.4 92.1 91.7 91.2 91.4  PLT 150 153 153 161 324   Basic Metabolic Panel: Recent Labs  Lab 05/27/19 0409 05/28/19 0425 05/29/19 0335 05/30/19 0339 05/31/19 0357  NA 130* 129* 128* 127* 126*  K 4.4 4.7 5.1 5.5* 5.4*  CL 91* 90* 90* 90* 89*  CO2 30 30 31 29 27   GLUCOSE 129* 129* 122* 125* 123*  BUN 31* 34* 36* 39* 44*  CREATININE 1.34* 1.52* 1.52* 1.59* 1.44*  CALCIUM 8.9 9.0 9.1 9.1 9.3   GFR: Estimated Creatinine Clearance: 69.8 mL/min (A) (by C-G  formula based on SCr of 1.44 mg/dL (H)). Liver Function Tests: Recent Labs  Lab 05/21/2019 2057 05/25/19 0109  AST 32 32  ALT 19 19  ALKPHOS 114 109  BILITOT 1.1 1.5*  PROT 5.6* 5.6*  ALBUMIN 3.1* 3.3*   Recent Labs  Lab 06/04/2019 2057  LIPASE 55*   No results for input(s): AMMONIA in the last 168 hours. Coagulation Profile: No results for input(s): INR, PROTIME in the last 168 hours. Cardiac Enzymes: No results for input(s): CKTOTAL, CKMB, CKMBINDEX, TROPONINI in the last 168 hours. BNP (last 3 results) No results for input(s): PROBNP in the last 8760 hours. HbA1C: No results for input(s): HGBA1C in the last 72 hours. CBG: Recent Labs  Lab 05/30/19 1209 05/30/19 1729 05/30/19 2001 05/31/19 0817 05/31/19 1256  GLUCAP 117* 152* 134* 168* 166*   Lipid Profile: No results for input(s): CHOL, HDL, LDLCALC, TRIG, CHOLHDL, LDLDIRECT in the last 72 hours. Thyroid Function Tests: No results for input(s): TSH, T4TOTAL, FREET4, T3FREE, THYROIDAB in the last 72 hours. Anemia Panel: No results for input(s): VITAMINB12, FOLATE, FERRITIN, TIBC, IRON, RETICCTPCT in the last 72 hours. Sepsis Labs: No results for input(s): PROCALCITON, LATICACIDVEN in the last 168 hours.  Recent Results (from the past 240 hour(s))  SARS CORONAVIRUS 2 (TAT 6-24 HRS) Nasopharyngeal Nasopharyngeal Swab     Status: None   Collection Time: 05/25/19  1:09 AM   Specimen: Nasopharyngeal Swab  Result Value Ref Range Status   SARS Coronavirus 2 NEGATIVE NEGATIVE Final    Comment: (NOTE) SARS-CoV-2 target nucleic acids are NOT DETECTED. The SARS-CoV-2 RNA is generally detectable in upper and lower respiratory specimens during the acute phase of infection. Negative results do not preclude SARS-CoV-2 infection, do not rule out co-infections with other pathogens, and should not be used as the sole basis for treatment or other patient management decisions. Negative results must be combined with clinical  observations, patient history, and epidemiological information. The expected result is Negative. Fact Sheet for Patients: SugarRoll.be Fact Sheet for Healthcare Providers: https://www.woods-mathews.com/ This test is not yet approved or cleared by the [place] FDA and  has been authorized for detection and/or diagnosis of SARS-CoV-2 by FDA under an Emergency Use Authorization (EUA). This EUA will remain  in effect (meaning this test can be used) for the duration of the COVID-19 declaration under Section 56 4(b)(1) of the Act, 21 U.S.C. section 360bbb-3(b)(1), unless the authorization is terminated or revoked sooner. Performed at [facility], [place] [address], [place], [place] [address]          Radiology Studies: Dg Chest Port 1 View  Result Date: 05/31/2019 CLINICAL DATA:  Dyspnea. EXAM: PORTABLE CHEST 1 VIEW COMPARISON:  July 28, 2017. FINDINGS: Stable cardiomediastinal silhouette. Mild central pulmonary vascular congestion is noted. No pneumothorax  is noted. Increased bibasilar opacities are noted concerning for worsening atelectasis or edema. Small pleural effusions are noted. Bony thorax is unremarkable. IMPRESSION: Mild central pulmonary vascular congestion is noted. Increased bibasilar opacities are noted concerning for worsening edema or atelectasis with small pleural effusions. Electronically Signed   By: Marijo Conception M.D.   On: 05/31/2019 13:12   Vas [place] Lower Extremity Venous (dvt)  Result Date: 05/30/2019  Lower Venous Study Indications: Edema.  Limitations: Body habitus and poor ultrasound/tissue interface. Comparison Study: No prior study Performing Technologist: Maudry Mayhew MHA, RDMS, RVT, RDCS  Examination Guidelines: A complete evaluation includes B-mode imaging, spectral Doppler, color Doppler, and power Doppler as needed of all accessible portions of each vessel. Bilateral testing is considered an integral  part of a complete examination. Limited examinations for reoccurring indications may be performed as noted.  +---------+---------------+---------+-----------+----------+--------------+ RIGHT    CompressibilityPhasicitySpontaneityPropertiesThrombus Aging +---------+---------------+---------+-----------+----------+--------------+ CFV      Full           Yes      Yes                                 +---------+---------------+---------+-----------+----------+--------------+ SFJ                                                   Not visualized +---------+---------------+---------+-----------+----------+--------------+ FV Prox  Full                                                        +---------+---------------+---------+-----------+----------+--------------+ FV Mid   Full                                                        +---------+---------------+---------+-----------+----------+--------------+ FV Distal                        Yes                                 +---------+---------------+---------+-----------+----------+--------------+ PFV      Full                                                        +---------+---------------+---------+-----------+----------+--------------+ POP      Full           Yes      Yes                                 +---------+---------------+---------+-----------+----------+--------------+ PTV                              Yes                                 +---------+---------------+---------+-----------+----------+--------------+  PERO                             Yes                                 +---------+---------------+---------+-----------+----------+--------------+ Calf veins not adequately visualized.  +---------+---------------+---------+-----------+----------+--------------+ LEFT     CompressibilityPhasicitySpontaneityPropertiesThrombus Aging  +---------+---------------+---------+-----------+----------+--------------+ CFV      Full           Yes      Yes                                 +---------+---------------+---------+-----------+----------+--------------+ SFJ                                                   Not visualized +---------+---------------+---------+-----------+----------+--------------+ FV Prox  Full                                                        +---------+---------------+---------+-----------+----------+--------------+ FV Mid   Full                                                        +---------+---------------+---------+-----------+----------+--------------+ FV Distal                        Yes                                 +---------+---------------+---------+-----------+----------+--------------+ POP      Full           Yes      Yes                                 +---------+---------------+---------+-----------+----------+--------------+ PTV                              Yes                                 +---------+---------------+---------+-----------+----------+--------------+ PERO                             Yes                                 +---------+---------------+---------+-----------+----------+--------------+ Calf veins not adequately visualized.    Summary: Right: There is no evidence of deep vein thrombosis in the lower extremity. However, portions of this examination were limited- see technologist comments above. No cystic structure found in the popliteal fossa. Left: There is no evidence  of deep vein thrombosis in the lower extremity. However, portions of this examination were limited- see technologist comments above. No cystic structure found in the popliteal fossa.  *See table(s) above for measurements and observations. Electronically signed by Harold Barban MD on 05/30/2019 at 5:51:16 PM.    Final         Scheduled Meds: . aspirin EC  81  mg Oral Daily  . atorvastatin  40 mg Oral Daily  . colesevelam  625 mg Oral BID WC  . diltiazem  90 mg Oral Daily  . enoxaparin (LOVENOX) injection  80 mg Subcutaneous Q24H  . famotidine  20 mg Oral QHS  . furosemide  40 mg Intravenous BID  . gabapentin  300 mg Oral BID  . insulin aspart  0-15 Units Subcutaneous TID WC  . insulin aspart  0-5 Units Subcutaneous QHS  . ipratropium-albuterol  3 mL Nebulization Q6H  . lidocaine  3 patch Transdermal Q24H  . linagliptin  5 mg Oral Daily  . loratadine  10 mg Oral Daily  . magnesium oxide  400 mg Oral Daily  . meclizine  25 mg Oral Daily  . mometasone-formoterol  2 puff Inhalation BID  . pantoprazole  40 mg Oral Daily  . polyethylene glycol  17 g Oral Daily   Continuous Infusions:   LOS: 6 days    Time spent: 40 minutes with over 50% of the time coordinating the patient's care    Harold Hedge, DO [facility] Pager [phone]  If 7PM-7AM, please contact night-coverage [email or web address] Password [email or web address] 05/31/2019, 3:22 PM

## 2019-05-31 NOTE — Evaluation (Addendum)
Physical Therapy Evaluation Patient Details Name: Shane Watson MRN: [id number] DOB: Jun 21, 1946 Today's Date: 05/31/2019   History of Present Illness  Pt is48 year old male with history of hypertension, chronic back pain/neuropathy, BPH, CKD stage III, COPD, CAD, diabetes mellitus, GERD, obstructive sleep apnea on CPAP.  Pt admitted with persistant back pain with lytic bony lesions secondary to metastatic adenocarcinoma.  Pt had doppler U/S of bil LE that was negative for DVT.  Clinical Impression   Pt admitted with above diagnosis.  Pt currently with functional limitations due to the deficits listed below (see PT Problem List). Pt will benefit from skilled PT to increase their independence and safety with mobility to allow discharge to the venue listed below.  He was able to ambulate 40'x2 with RW and min A and was min A with transfers.  Pt lives alone and is unsafe to return home from PT perspective -recommend SNF.  Additionally, pt with difficulty with ADLs -recommend OT.  Pt rehab potential limited due to pain and medical comorbidities/diagnosis.      Follow Up Recommendations SNF    Equipment Recommendations  Rolling walker with 5" wheels    Recommendations for Other Services OT consult     Precautions / Restrictions Precautions Precautions: Fall Restrictions Weight Bearing Restrictions: No      Mobility  Bed Mobility Overal bed mobility: Needs Assistance Bed Mobility: Supine to Sit;Sit to Supine     Supine to sit: Min assist Sit to supine: Min assist   General bed mobility comments: Per pt report: he was up in chair at arrival, nursing had assisted, pt reports assist of 1  Transfers Overall transfer level: Needs assistance Equipment used: Rolling walker (2 wheeled) Transfers: Sit to/from Stand Sit to Stand: Min assist         General transfer comment: Performed sit to stand x 3; cued for safe hand placement  Ambulation/Gait Ambulation/Gait assistance: Min  assist Gait Distance (Feet): 40 Feet Assistive device: Rolling walker (2 wheeled) Gait Pattern/deviations: Wide base of support     General Gait Details: 40'x2; increased lateral weight shift, labored gait, chair follow with one seated rest break, cues for RW use  Stairs            Wheelchair Mobility    Modified Rankin (Stroke Patients Only)       Balance Overall balance assessment: Needs assistance Sitting-balance support: Bilateral upper extremity supported;Feet supported Sitting balance-Leahy Scale: Fair     Standing balance support: Bilateral upper extremity supported Standing balance-Leahy Scale: Fair                               Pertinent Vitals/Pain Pain Assessment: 0-10 Pain Score: 8  Pain Location: back Pain Descriptors / Indicators: Sore;Throbbing Pain Intervention(s): Limited activity within patient's tolerance;Premedicated before session;Repositioned    Home Living Family/patient expects to be discharged to:: Unsure Living Arrangements: Alone Available Help at Discharge: Other (Comment)(son lives out of town, looking into options for assist at home)   Home Access: Stairs to enter Entrance Stairs-Rails: None Entrance Stairs-Number of Steps: 1 Home Layout: One level Home Equipment: Grab bars - tub/shower;Cane - single point      Prior Function Level of Independence: Independent         Comments: Pt reports he could ambulate in community, drive, and perform ADL/IADLs prior to admission.     Hand Dominance        Extremity/Trunk Assessment  Upper Extremity Assessment Upper Extremity Assessment: Overall WFL for tasks assessed    Lower Extremity Assessment Lower Extremity Assessment: Overall WFL for tasks assessed(gentle MMT due to back pain; bil LE with significant pitting edema)    Cervical / Trunk Assessment Cervical / Trunk Assessment: Normal  Communication   Communication: No difficulties  Cognition  Arousal/Alertness: Awake/alert Behavior During Therapy: Flat affect Overall Cognitive Status: Within Functional Limits for tasks assessed                                 General Comments: son present; pt occasionally required increased time to responds (recently had pain meds)      General Comments General comments (skin integrity, edema, etc.): On 3 LPM O2 with sats 94-96% throughout treatment;  HR 85-95 bpm    Exercises     Assessment/Plan    PT Assessment Patient needs continued PT services  PT Problem List Decreased strength;Decreased mobility;Decreased safety awareness;Decreased activity tolerance;Decreased balance;Pain       PT Treatment Interventions DME instruction;Therapeutic exercise;Gait training;Balance training;Stair training;Functional mobility training;Therapeutic activities;Patient/family education    PT Goals (Current goals can be found in the Care Plan section)  Acute Rehab PT Goals Patient Stated Goal: agrees to short term rehab as an option while they figure out options for getting home PT Goal Formulation: With patient/family Time For Goal Achievement: 06/14/19 Potential to Achieve Goals: Fair    Frequency Min 2X/week   Barriers to discharge Decreased caregiver support      Co-evaluation               AM-PAC PT "6 Clicks" Mobility  Outcome Measure Help needed turning from your back to your side while in a flat bed without using bedrails?: A Little Help needed moving from lying on your back to sitting on the side of a flat bed without using bedrails?: A Little Help needed moving to and from a bed to a chair (including a wheelchair)?: A Little Help needed standing up from a chair using your arms (e.g., wheelchair or bedside chair)?: A Little Help needed to walk in hospital room?: A Little Help needed climbing 3-5 steps with a railing? : A Lot 6 Click Score: 17    End of Session Equipment Utilized During Treatment: Gait  belt Activity Tolerance: Patient limited by pain;Patient limited by fatigue Patient left: in chair;with chair alarm set;with family/visitor present(feet elevated) Nurse Communication: Mobility status PT Visit Diagnosis: Unsteadiness on feet (R26.81);Muscle weakness (generalized) (M62.81)    Time: 6256-3893 PT Time Calculation (min) (ACUTE ONLY): 30 min   Charges:     PT Treatments $Gait Training: 8-22 mins Eval Moderate        Maggie Font, PT Acute Rehab Services Pager [phone] [facility] [phone] [facility] [phone]   Karlton Lemon 05/31/2019, 11:49 AM

## 2019-05-31 NOTE — TOC Initial Note (Signed)
Transition of Care (TOC) - Initial/Assessment Note    Patient Details  Name: Shane Watson MRN: [id number] Date of Birth: 08/28/1945  Transition of Care (TOC) CM/SW Contact:     C , LCSW Phone Number: 05/31/2019, 5:04 PM  Clinical Narrative:    CSW met patient at bedside with son present and significant other on speaker phone. CSW went over discharge plans with family. CSW spoke to son in detail about the difference between SNF vs. Home with Hospice vs. Home with HH and palliative following.  Son stated that he is leaning more towards having patient return home to his significant other. CSW contacted jennifer from [facility] to go over palliative vs. Hospice in the home. Jennifer stated she will meet with son face to face at patients bedside tomorrow. CSW stated to family that she will assist them in ordering DMEs (hospital bed, rolling walker, bed side commode) for patient. Son stated that patient receives his oxygen from [facility]. Family wanted to have the night to solidify discharge plan for patient             Expected Discharge Plan: Home w Home Health Services Barriers to Discharge: Continued Medical Work up   Patient Goals and CMS Choice Patient states their goals for this hospitalization and ongoing recovery are:: to be home with family   Choice offered to / list presented to : NA  Expected Discharge Plan and Services Expected Discharge Plan: Home w Home Health Services In-house Referral: Clinical Social Work   Post Acute Care Choice: Home Health Living arrangements for the past 2 months: Apartment                                      Prior Living Arrangements/Services Living arrangements for the past 2 months: Apartment Lives with:: Self Patient language and need for interpreter reviewed:: Yes Do you feel safe going back to the place where you live?: Yes      Need for Family Participation in Patient Care: Yes (Comment) Care giver support system  in place?: Yes (comment)   Criminal Activity/Legal Involvement Pertinent to Current Situation/Hospitalization: No - Comment as needed  Activities of Daily Living Home Assistive Devices/Equipment: CPAP ADL Screening (condition at time of admission) Patient's cognitive ability adequate to safely complete daily activities?: Yes Is the patient deaf or have difficulty hearing?: No Does the patient have difficulty seeing, even when wearing glasses/contacts?: No Does the patient have difficulty concentrating, remembering, or making decisions?: No Patient able to express need for assistance with ADLs?: Yes Does the patient have difficulty dressing or bathing?: No Independently performs ADLs?: Yes (appropriate for developmental age) Does the patient have difficulty walking or climbing stairs?: No Weakness of Legs: None Weakness of Arms/Hands: None  Permission Sought/Granted Permission sought to share information with : Family Supports Permission granted to share information with : Yes, Verbal Permission Granted  Share Information with NAME: Robert Donson     Permission granted to share info w Relationship: [phone]  Permission granted to share info w Contact Information: son  Emotional Assessment Appearance:: Appears stated age Attitude/Demeanor/Rapport: (pt was in and out of sleep) Affect (typically observed): Accepting, Pleasant Orientation: : Oriented to Self, Oriented to Place, Oriented to  Time, Oriented to Situation Alcohol / Substance Use: Not Applicable Psych Involvement: No (comment)  Admission diagnosis:  Metastatic malignant neoplasm, unspecified site (HCC) [C79.9] Patient Active Problem List     Diagnosis Date Noted  . Adenocarcinoma, 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HCC)   . Bone metastases (HCC)   . Counseling regarding advance care planning and goals of care   . ARF (acute renal failure) (HCC) 05/25/2019  . Back pain 05/25/2019  . Diastolic CHF (HCC) 05/25/2019  . IBS (irritable bowel  syndrome) 05/15/2019  . Acute bronchitis with COPD (HCC) 07/27/2018  . Abnormal CT scan of lung 03/30/2017  . Morbid obesity (HCC) 10/08/2015  . Lower extremity edema 10/08/2015  . S/P shoulder replacement 10/08/2015  . Fracture, humerus, proximal 10/07/2015  . Coronary artery disease   . Hypertensive heart disease   . CKD (chronic kidney disease), stage III   . Hyperlipidemia   . COPD (chronic obstructive pulmonary disease) (HCC)   . Morbid obesity due to excess calories (HCC) 02/25/2015  . Dyspnea 02/04/2015  . CKD (chronic kidney disease) 10/09/2014  . Coronary artery disease due to lipid rich plaque 10/09/2014  . Chronic respiratory failure with hypercapnia (HCC) 05/08/2014  . Cellulitis of left lower extremity 02/20/2014  . Leukocytosis, unspecified 02/20/2014  . COPD exacerbation (HCC) 02/20/2014  . Dyslipidemia 02/20/2014  . CKD (chronic kidney disease) stage 4, GFR 15-29 ml/min (HCC) 02/20/2014  . Multiple pulmonary nodules 11/01/2013  . Asthmatic bronchitis , chronic (HCC)   FEV1/FVC nl p bronchodilators  05/03/2013  . Obstructive sleep apnea 05/02/2013   PCP:  Harris, William, MD Pharmacy:   [id number] - [place], [place] - [address] AT [address] [place] [address] Phone: [phone] Fax: [phone]  [facility] - [place], [place] - [address] [place] [address] Phone: [phone] Fax: [phone]  [facility] - [place], [place] - [address] [place] [address] Phone: [phone] Fax: [phone]     Social Determinants of Health (SDOH) Interventions    Readmission Risk Interventions No flowsheet data found.  

## 2019-06-01 ENCOUNTER — Ambulatory Visit
Admit: 2019-06-01 | Discharge: 2019-06-01 | Disposition: A | Discharge status: Home / Self Care | Payer: Medicare Other | Attending: Radiation Oncology | Admitting: Radiation Oncology

## 2019-06-01 DIAGNOSIS — C7951 Secondary malignant neoplasm of bone: Secondary | ICD-10-CM | POA: Diagnosis not present

## 2019-06-01 DIAGNOSIS — R52 Pain, unspecified: Secondary | ICD-10-CM

## 2019-06-01 LAB — CBC
HCT: 39.4 % (ref 39.0–52.0)
Hemoglobin: 12.4 g/dL — ABNORMAL LOW (ref 13.0–17.0)
MCH: 28.6 pg (ref 26.0–34.0)
MCHC: 31.5 g/dL (ref 30.0–36.0)
MCV: 90.8 fL (ref 80.0–100.0)
Platelets: 159 10*3/uL (ref 150–400)
RBC: 4.34 MIL/uL (ref 4.22–5.81)
RDW: 16.8 % — ABNORMAL HIGH (ref 11.5–15.5)
WBC: 7.8 10*3/uL (ref 4.0–10.5)
nRBC: 0 % (ref 0.0–0.2)

## 2019-06-01 LAB — BASIC METABOLIC PANEL
Anion gap: 9 (ref 5–15)
BUN: 43 mg/dL — ABNORMAL HIGH (ref 8–23)
CO2: 30 mmol/L (ref 22–32)
Calcium: 9.2 mg/dL (ref 8.9–10.3)
Chloride: 89 mmol/L — ABNORMAL LOW (ref 98–111)
Creatinine, Ser: 1.45 mg/dL — ABNORMAL HIGH (ref 0.61–1.24)
GFR calc Af Amer: 55 mL/min — ABNORMAL LOW (ref >60)
GFR calc non Af Amer: 47 mL/min — ABNORMAL LOW (ref >60)
Glucose, Bld: 127 mg/dL — ABNORMAL HIGH (ref 70–99)
Potassium: 5.5 mmol/L — ABNORMAL HIGH (ref 3.5–5.1)
Sodium: 128 mmol/L — ABNORMAL LOW (ref 135–145)

## 2019-06-01 LAB — GLUCOSE, CAPILLARY
Glucose-Capillary: 114 mg/dL — ABNORMAL HIGH (ref 70–99)
Glucose-Capillary: 159 mg/dL — ABNORMAL HIGH (ref 70–99)
Glucose-Capillary: 206 mg/dL — ABNORMAL HIGH (ref 70–99)
Glucose-Capillary: 147 mg/dL — ABNORMAL HIGH (ref 70–99)

## 2019-06-01 LAB — MAGNESIUM: Magnesium: 2.7 mg/dL — ABNORMAL HIGH (ref 1.7–2.4)

## 2019-06-01 MED ORDER — FUROSEMIDE 10 MG/ML IJ SOLN
80.0000 mg | Freq: Once | INTRAMUSCULAR | Status: AC
  Administered 2019-06-01: 17:00:00 80 mg via INTRAVENOUS
  Filled 2019-06-01: qty 8

## 2019-06-01 MED ORDER — FUROSEMIDE 10 MG/ML IJ SOLN
60.0000 mg | Freq: Once | INTRAMUSCULAR | Status: AC
  Administered 2019-06-01: 10:00:00 60 mg via INTRAVENOUS
  Filled 2019-06-01: qty 6

## 2019-06-01 MED ORDER — BELLADONNA ALKALOIDS-OPIUM 16.2-60 MG RE SUPP
1.0000 | Freq: Three times a day (TID) | RECTAL | Status: DC | PRN
  Administered 2019-06-01 – 2019-06-03 (×4): 1 via RECTAL
  Filled 2019-06-01 (×4): qty 1

## 2019-06-01 MED ORDER — IPRATROPIUM-ALBUTEROL 0.5-2.5 (3) MG/3ML IN SOLN
3.0000 mL | Freq: Four times a day (QID) | RESPIRATORY_TRACT | Status: DC
  Administered 2019-06-01 – 2019-06-05 (×17): 3 mL via RESPIRATORY_TRACT
  Filled 2019-06-01 (×18): qty 3

## 2019-06-01 NOTE — Progress Notes (Signed)
PROGRESS NOTE    Shane Watson    Code Status: DNR  FUX:[id number] DOB: 1945-10-30 DOA: 06/05/2019  PCP: Shirline Frees, MD    Hospital Summary   73 year old male with history of hypertension, chronic back pain/neuropathy, BPH, CKD stage III, drug-eluting stent 12/18/2010, COPD, CAD, diabetes mellitus, GERD, obstructive sleep apnea on CPAP evaluated by PCPfew weeks back forback pain/abdominal pain, underwent outpatient work upwithCT scan of the abdomen pelvis on 10/12showinglower mediastinal and abdominal/pelvic lymphadenopathy related to lymphoma or metastatic disease as well astiny pulmonary nodules in the right lung base which were stable since the prior study->PET scan performed on 05/04/2019. PET scan results showed hypermetabolic mediastinal prevascular nodes and hypermetabolic retroperitoneal and mesenteric nodes consistent with lymphoma versus granulomatous disease (sarcoidosis),lytic lesion in the T7 vertebral body with moderate metabolic activity concerning for skeletal metastasis versus sarcoid lesion, small pulmonary nodules do not have associated metabolic activity-->patient underwent CT-guided retroperitoneal lymph node biopsy by IR on 10/22and postprocedure complained of pain radiating to his groin-NSAIDs were recommended. Pathology results indicate undifferentiated adenocarcinoma-->He was referred for oncology evaluation and was scheduled to seeDr Irene Limbo on 11/10.  Patient had worsening back pain and presented to Urgent care on11/2and prescribed 5mg  Oxycodone-1 tab was not enough and 2 tabs were making him too drowsy. Patientwas alsoseen by his primary pulmonologistrecently as outpatient for chest/abdominal pain complaints- felt to havesubdiaphragmatic pain/bloating and dietary changes were advised. Last colonoscopy was 3 years back.  [facility] complaints of persistentbackpainradiating to left anterior abdomen.He also reports  worsening lower extremity edema.   He has since been evaluated by palliative care for goals of care pain management and has underwent 4/10 radiation treatments.  A & P   Principal Problem:   ARF (acute renal failure) (HCC) Active Problems:   CKD (chronic kidney disease) stage 4, GFR 15-29 ml/min (HCC)   Coronary artery disease due to lipid rich plaque   COPD (chronic obstructive pulmonary disease) (HCC)   Back pain   Diastolic CHF (HCC)   Adenocarcinoma, 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HCC)   Bone metastases ([place])   Counseling regarding advance care planning and goals of care   Intractable back pain secondary to pleuritic bony lesions metastatic adenocarcinoma with unknown primary -Per palliative: Continue IV Dilaudid PRN for now. Also on PRN PO Oxy IR and monitor. Would not start any long acting opioids yet.    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  AFP, CEA, hCG, PSA normal. Per Dr. Irene Limbo, oncology, this is incurable and possible options are palliative.  Started XRT 11/9, currently day 4/10 -At this point, family is considering home with hospice versus PT and skilled nursing.  Currently, hospice is unable to take the patient until completion of patient's palliative XRT which is scheduled to finish next Friday.  Family would like to see if another hospice will be willing to start services now. -Continue following with radiation oncology, palliative care, and case manager  Diastolic heart failure exacerbation EF 50 to 55%. Weights improving 157.9 ->161->160 kg today following Lasix 60 mg IV.  Still significantly volume overloaded and short of breath/orthopneic however with some improvement following Lasix received additional Lasix 60 mg IV this morning.   -Lasix 80 mg IV x1 this evening  Acute respiratory failure secondary to diastolic heart failure exacerbation, COPD exacerbation, likely with OSA and OHV  syndrome SPO2 93% on 3 L nasal cannula, does not wear oxygen at home.  -Lasix 80 mg  IV x1 -Continue nebulizer treatments  COPD exacerbation likely secondary to heart failure exacerbation afebrile without leukocytosis no signs of infection.  Still with wheezing on exam. -DuoNeb every 6 hours -Albuterol neb every 4 hours as needed -Dulera twice daily -Diuresis as above  Hyponatremia likely heart failure as improving with diuresis, currently 128 -IV Lasix -Continue to hold HCTZ  Hyperkalemia K5.5.  Asymptomatic -Continue Lasix and insulin  Type 2 diabetes stable -Continue sliding scale, Tradjenta  AKI on CKD 3 creatinine has plateaued at 1.4-1.5, likely baseline.  Without hydronephrosis or renal calculi on CT with extensive stranding in the retroperitoneum and upper abdomen around adrenal glands -Hold HCTZ and losartan  History of palpitations started on Cardizem 2019.  Stable -Continue Cardizem  CAD status post DES 2012  -Continue aspirin, statin  Hyperlipidemia -Continue Lipitor  Morbid obesity BMI 46 -Was counseled on diet weight control  GERD stable -Continue current management   DVT prophylaxis: Lovenox Diet: Heart healthy Family Communication: Son updated by phone Disposition Plan: Pending clinical stability and  family is considering home with hospice versus PT and skilled nursing.  Currently, hospice is unable to take the patient until completion of patient's palliative XRT which is scheduled to finish next Friday.  Family would like to see if another hospice will be willing to start services now.  Consultants  Palliative Oncology Radiation oncology  Procedures  XRT x4      Subjective   States his symptoms are improved from yesterday regarding his shortness of breath however not resolved.  Continues to have low back pain.  Otherwise denies chest pain, nausea, vomiting.  Remaining ROS negative  Objective   Vitals:   05/31/19 1954 05/31/19 2130  06/01/19 0517 06/01/19 1332  BP:  (!) 141/64 (!) 144/65 (!) 143/56  Pulse:  76 87 86  Resp:  15 18 18   Temp:  97.7 F (36.5 C) (!) 97.2 F (36.2 C) (!) 97.5 F (36.4 C)  TempSrc:  Axillary Axillary Oral  SpO2: 95% 95% 96% 93%  Weight:   (!) 160.8 kg   Height:        Intake/Output Summary (Last 24 hours) at 06/01/2019 1543 Last data filed at 06/01/2019 1056 Gross per 24 hour  Intake 580 ml  Output 1750 ml  Net -1170 ml   Filed Weights   05/30/19 0448 05/31/19 0554 06/01/19 0517  Weight: (!) 162.5 kg (!) 161 kg (!) 160.8 kg    Examination:  Physical Exam Vitals signs and nursing note reviewed.  Constitutional:      Appearance: He is obese. He is ill-appearing.  HENT:     Head: Normocephalic and atraumatic.     Nose:     Comments: Nasal cannula    Mouth/Throat:     Mouth: Mucous membranes are moist.  Eyes:     Extraocular Movements: Extraocular movements intact.  Cardiovascular:     Rate and Rhythm: Normal rate and regular rhythm.  Pulmonary:     Effort: Pulmonary effort is normal.     Comments: Diffuse wheezes Abdominal:     General: Bowel sounds are normal.     Palpations: Abdomen is soft.  Musculoskeletal:     Right lower leg: Edema present.     Left lower leg: Edema present.     Comments: Bilateral lower extremity 3+ pitting edema  Skin:    General: Skin is warm.  Neurological:     Mental Status: He is alert.     Comments: Still somnolent but improved  Response appropriately to questioning  Psychiatric:        Thought Content: Thought content normal.     Data Reviewed: I have personally reviewed following labs and imaging studies  CBC: Recent Labs  Lab 05/26/19 0327 05/30/19 0339 05/31/19 0357 06/01/19 0553  WBC 6.2 7.2 7.6 7.8  HGB 11.6* 11.5* 12.4* 12.4*  HCT 37.8* 36.3* 39.4 39.4  MCV 91.7 91.2 91.4 90.8  PLT 153 161 164 119   Basic Metabolic Panel: Recent Labs  Lab 05/28/19 0425 05/29/19 0335 05/30/19 0339 05/31/19 0357 06/01/19  0553  NA 129* 128* 127* 126* 128*  K 4.7 5.1 5.5* 5.4* 5.5*  CL 90* 90* 90* 89* 89*  CO2 30 31 29 27 30   GLUCOSE 129* 122* 125* 123* 127*  BUN 34* 36* 39* 44* 43*  CREATININE 1.52* 1.52* 1.59* 1.44* 1.45*  CALCIUM 9.0 9.1 9.1 9.3 9.2  MG  --   --   --   --  2.7*   GFR: Estimated Creatinine Clearance: 69.2 mL/min (A) (by C-G formula based on SCr of 1.45 mg/dL (H)). Liver Function Tests: No results for input(s): AST, ALT, ALKPHOS, BILITOT, PROT, ALBUMIN in the last 168 hours. No results for input(s): LIPASE, AMYLASE in the last 168 hours. No results for input(s): AMMONIA in the last 168 hours. Coagulation Profile: No results for input(s): INR, PROTIME in the last 168 hours. Cardiac Enzymes: No results for input(s): CKTOTAL, CKMB, CKMBINDEX, TROPONINI in the last 168 hours. BNP (last 3 results) No results for input(s): PROBNP in the last 8760 hours. HbA1C: No results for input(s): HGBA1C in the last 72 hours. CBG: Recent Labs  Lab 05/31/19 1256 05/31/19 1622 05/31/19 1958 06/01/19 0717 06/01/19 1155  GLUCAP 166* 131* 160* 114* 159*   Lipid Profile: No results for input(s): CHOL, HDL, LDLCALC, TRIG, CHOLHDL, LDLDIRECT in the last 72 hours. Thyroid Function Tests: No results for input(s): TSH, T4TOTAL, FREET4, T3FREE, THYROIDAB in the last 72 hours. Anemia Panel: No results for input(s): VITAMINB12, FOLATE, FERRITIN, TIBC, IRON, RETICCTPCT in the last 72 hours. Sepsis Labs: No results for input(s): PROCALCITON, LATICACIDVEN in the last 168 hours.  Recent Results (from the past 240 hour(s))  SARS CORONAVIRUS 2 (TAT 6-24 HRS) Nasopharyngeal Nasopharyngeal Swab     Status: None   Collection Time: 05/25/19  1:09 AM   Specimen: Nasopharyngeal Swab  Result Value Ref Range Status   SARS Coronavirus 2 NEGATIVE NEGATIVE Final    Comment: (NOTE) SARS-CoV-2 target nucleic acids are NOT DETECTED. The SARS-CoV-2 RNA is generally detectable in upper and lower respiratory specimens  during the acute phase of infection. Negative results do not preclude SARS-CoV-2 infection, do not rule out co-infections with other pathogens, and should not be used as the sole basis for treatment or other patient management decisions. Negative results must be combined with clinical observations, patient history, and epidemiological information. The expected result is Negative. Fact Sheet for Patients: SugarRoll.be Fact Sheet for Healthcare Providers: https://www.woods-mathews.com/ This test is not yet approved or cleared by the [place] FDA and  has been authorized for detection and/or diagnosis of SARS-CoV-2 by FDA under an Emergency Use Authorization (EUA). This EUA will remain  in effect (meaning this test can be used) for the duration of the COVID-19 declaration under Section 56 4(b)(1) of the Act, 21 U.S.C. section 360bbb-3(b)(1), unless the authorization is terminated or revoked sooner. Performed at [facility], [place] [address], [place], [place] [address]  Radiology Studies: Dg Chest Port 1 View  Result Date: 05/31/2019 CLINICAL DATA:  Dyspnea. EXAM: PORTABLE CHEST 1 VIEW COMPARISON:  July 28, 2017. FINDINGS: Stable cardiomediastinal silhouette. Mild central pulmonary vascular congestion is noted. No pneumothorax is noted. Increased bibasilar opacities are noted concerning for worsening atelectasis or edema. Small pleural effusions are noted. Bony thorax is unremarkable. IMPRESSION: Mild central pulmonary vascular congestion is noted. Increased bibasilar opacities are noted concerning for worsening edema or atelectasis with small pleural effusions. Electronically Signed   By: Marijo Conception M.D.   On: 05/31/2019 13:12        Scheduled Meds: . aspirin EC  81 mg Oral Daily  . atorvastatin  40 mg Oral Daily  . Chlorhexidine Gluconate Cloth  6 each Topical Daily  . colesevelam  625 mg Oral BID WC  .  diltiazem  90 mg Oral Daily  . enoxaparin (LOVENOX) injection  80 mg Subcutaneous Q24H  . famotidine  20 mg Oral QHS  . furosemide  80 mg Intravenous Once  . gabapentin  300 mg Oral BID  . insulin aspart  0-15 Units Subcutaneous TID WC  . insulin aspart  0-5 Units Subcutaneous QHS  . [START ON 06/02/2019] ipratropium-albuterol  3 mL Nebulization Q6H  . lidocaine  3 patch Transdermal Q24H  . linagliptin  5 mg Oral Daily  . loratadine  10 mg Oral Daily  . magnesium oxide  400 mg Oral Daily  . meclizine  25 mg Oral Daily  . mometasone-formoterol  2 puff Inhalation BID  . pantoprazole  40 mg Oral Daily  . polyethylene glycol  17 g Oral Daily   Continuous Infusions:   LOS: 7 days    Time spent: 30 minutes with over 50% of the time coordinating the patient's care    Harold Hedge, DO [facility] Pager [phone]  If 7PM-7AM, please contact night-coverage [email or web address] Password [facility] 06/01/2019, 3:43 PM

## 2019-06-01 NOTE — Progress Notes (Signed)
[facility] Therapy Treatment Record Phone [phone]   Radiation Therapy was administered to Shane Watson on: 06/01/2019  12:34 PM and was treatment # 4 out of a planned course of 10 treatments.  Radiation Treatment  1). Beam photons with 6-10 energy  2). Brachytherapy None  3). Stereotactic Radiosurgery None  4). Other Radiation None     Charlean Carneal A Myrlene Riera, RT (T)

## 2019-06-01 NOTE — Progress Notes (Signed)
Per pt request- removed from cpap to RA (pt states he only uses 2 lpm with cpap).

## 2019-06-01 NOTE — TOC Progression Note (Signed)
Transition of Care [facility]) - Progression Note    Patient Details  Name: Shane Watson MRN: [id number] Date of Birth: 07-24-1945  Transition of Care [facility]) CM/SW Contact  Purcell Mouton, RN Phone Number: 06/01/2019, 1:01 PM  Clinical Narrative:    Follow up on Anderson Malta, RN request to check with [facility] concerning Palliative Radiation treatment was made.   Expected Discharge Plan: [place] Barriers to Discharge: Continued Medical Work up  Expected Discharge Plan and Services Expected Discharge Plan: [place] In-house Referral: Clinical Social Work   Post Acute Care Choice: [place] arrangements for the past 2 months: Apartment                                       Social Determinants of Health (SDOH) Interventions    Readmission Risk Interventions No flowsheet data found.

## 2019-06-01 NOTE — TOC Progression Note (Signed)
Transition of Care [facility]) - Progression Note    Patient Details  Name: Shane Watson MRN: [id number] Date of Birth: 12-09-1945  Transition of Care [facility]) CM/SW Contact  Shane Mouton, RN Phone Number: 06/01/2019, 3:02 PM  Clinical Narrative:    Spoke with pt's son concerning [facility] [place] and he agreed to talk with Shane Cory, RN concerning Radiation Palliative treatment. Pt's son Shane Watson asked that PT come back to see pt again and work on pt getting OOB to chair and BSC. Explained to Shane Watson that pt may not get to where he can do that on his own in the hospital. Also explained that pt's go to a different setting SNF where pt would have five days a week of PT and longer than in the hospital.  Shane Watson will to speak to his family concerning which Hospice to select.    Expected Discharge Plan: [place] Barriers to Discharge: Continued Medical Work up  Expected Discharge Plan and Services Expected Discharge Plan: [place] In-house Referral: Clinical Social Work   Post Acute Care Choice: [place] arrangements for the past 2 months: Apartment                                       Social Determinants of Health (SDOH) Interventions    Readmission Risk Interventions No flowsheet data found.

## 2019-06-01 NOTE — Progress Notes (Signed)
Daily Progress Note   Patient Name: Shane Watson       Date: 06/01/2019 DOB: Oct 11, 1945  Age: 73 y.o. MRN#: [id number] Attending Physician: Harold Hedge, MD Primary Care Physician: Shirline Frees, MD Admit Date: 06/18/2019  Reason for Consultation/Follow-up: Establishing goals of care and Pain control  Subjective: [name] is awake and alert sitting in a chair.  He is asking for pain medicine.   Upon entering his room, I found his son [name] on the phone with hospice liaisons, trying to discuss about all options for safe discharge. I participated in the discussion.   See below.    Length of Stay: 7  Current Medications: Scheduled Meds:  . aspirin EC  81 mg Oral Daily  . atorvastatin  40 mg Oral Daily  . Chlorhexidine Gluconate Cloth  6 each Topical Daily  . colesevelam  625 mg Oral BID WC  . diltiazem  90 mg Oral Daily  . enoxaparin (LOVENOX) injection  80 mg Subcutaneous Q24H  . famotidine  20 mg Oral QHS  . furosemide  80 mg Intravenous Once  . gabapentin  300 mg Oral BID  . insulin aspart  0-15 Units Subcutaneous TID WC  . insulin aspart  0-5 Units Subcutaneous QHS  . [START ON 06/02/2019] ipratropium-albuterol  3 mL Nebulization Q6H  . lidocaine  3 patch Transdermal Q24H  . linagliptin  5 mg Oral Daily  . loratadine  10 mg Oral Daily  . magnesium oxide  400 mg Oral Daily  . meclizine  25 mg Oral Daily  . mometasone-formoterol  2 puff Inhalation BID  . pantoprazole  40 mg Oral Daily  . polyethylene glycol  17 g Oral Daily    Continuous Infusions:   PRN Meds: acetaminophen **OR** acetaminophen, albuterol, diphenoxylate-atropine, hydrALAZINE, HYDROmorphone (DILAUDID) injection, naLOXone (NARCAN)  injection, ondansetron (ZOFRAN) IV, opium-belladonna, oxybutynin,  oxyCODONE, polyvinyl alcohol  Physical Exam         General: Alert, awake, in no acute distress.  sitting up in chair this morning.  HEENT: No bruits, no goiter, no JVD Heart: Regular rate and rhythm. No murmur appreciated. Lungs: Good air movement, clear Abdomen: Soft, nontender, nondistended, positive bowel sounds.  Obese. Skin: Warm and dry Has a lot of LE edema Awake alert oriented  Vital Signs: BP (!) 144/65 (BP Location:  Left Arm)   Pulse 87   Temp (!) 97.2 F (36.2 C) (Axillary)   Resp 18   Ht 5\' 11"  (1.803 m)   Wt (!) 160.8 kg   SpO2 96%   BMI 49.46 kg/m  SpO2: SpO2: 96 % O2 Device: O2 Device: Room Air O2 Flow Rate: O2 Flow Rate (L/min): 96 L/min(just ofdf cpap and will place on 02 pt req)  Intake/output summary:   Intake/Output Summary (Last 24 hours) at 06/01/2019 1149 Last data filed at 06/01/2019 4010 Gross per 24 hour  Intake 1111.31 ml  Output 1125 ml  Net -13.69 ml   LBM: Last BM Date: 05/31/19 Baseline Weight: Weight: (!) 157.9 kg Most recent weight: Weight: (!) 160.8 kg       Palliative Assessment/Data:      Patient Active Problem List   Diagnosis Date Noted  . 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 Bone metastases ([place])   . Counseling regarding advance care planning and goals of care   . ARF (acute renal failure) ([place]) 05/25/2019  . Back pain 05/25/2019  . Diastolic CHF ([place]) 27/25/3664  . IBS (irritable bowel syndrome) 05/15/2019  . Acute bronchitis with COPD ([place]) 07/27/2018  . Abnormal CT scan of lung 03/30/2017  . Morbid obesity ([place]) 10/08/2015  . Lower extremity edema 10/08/2015  . S/P shoulder replacement 10/08/2015  . Fracture, humerus, proximal 10/07/2015  . Coronary artery disease   . Hypertensive heart disease   . CKD (chronic kidney disease), stage III   . Hyperlipidemia   . COPD (chronic obstructive pulmonary disease) ([place])   . Morbid obesity due to excess calories ([place]) 02/25/2015  . Dyspnea 02/04/2015  . CKD (chronic  kidney disease) 10/09/2014  . Coronary artery disease due to lipid rich plaque 10/09/2014  . Chronic respiratory failure with hypercapnia ([place]) 05/08/2014  . Cellulitis of left lower extremity 02/20/2014  . Leukocytosis, unspecified 02/20/2014  . COPD exacerbation ([place]) 02/20/2014  . Dyslipidemia 02/20/2014  . CKD (chronic kidney disease) stage 4, GFR 15-29 ml/min (HCC) 02/20/2014  . Multiple pulmonary nodules 11/01/2013  . Asthmatic bronchitis , chronic (HCC)   FEV1/FVC nl p bronchodilators  05/03/2013  . Obstructive sleep apnea 05/02/2013    Palliative Care Assessment & Plan   Patient Profile: 73 y.o. male  with past medical history of hypertension, hyperlipidemia, diabetes with peripheral neuropathy, CKD stage III, CAD, anemia, BPH admitted on 06/13/2019 with worsening back pain.  Earlier this month, patient had CT of abdomen pelvis that showed lower mediastinal and abdominal/pelvic adenopathy which could be related to lymphoma or metastatic disease.  There are also multiple lucent bone lesions concerning for metastatic disease.  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  Staining profile is nonspecific.  He is currently admitted for worsened back pain with plan to start radiation therapy on Monday.  Palliative consulted for pain management and goals of care.  Assessment: Patient Active Problem List   Diagnosis Date Noted  . 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 Bone metastases ([place])   . Counseling regarding advance care planning and goals of care   . ARF (acute renal failure) ([place]) 05/25/2019  . Back pain 05/25/2019  . Diastolic CHF ([place]) 40/34/7425  . IBS (irritable bowel syndrome) 05/15/2019  . Acute bronchitis with COPD ([place]) 07/27/2018  . Abnormal CT scan of lung 03/30/2017  . Morbid obesity ([place]) 10/08/2015  . Lower extremity edema 10/08/2015  . S/P  shoulder  replacement 10/08/2015  . Fracture, humerus, proximal 10/07/2015  . Coronary artery disease   . Hypertensive heart disease   . CKD (chronic kidney disease), stage III   . Hyperlipidemia   . COPD (chronic obstructive pulmonary disease) ([place])   . Morbid obesity due to excess calories ([place]) 02/25/2015  . Dyspnea 02/04/2015  . CKD (chronic kidney disease) 10/09/2014  . Coronary artery disease due to lipid rich plaque 10/09/2014  . Chronic respiratory failure with hypercapnia ([place]) 05/08/2014  . Cellulitis of left lower extremity 02/20/2014  . Leukocytosis, unspecified 02/20/2014  . COPD exacerbation ([place]) 02/20/2014  . Dyslipidemia 02/20/2014  . CKD (chronic kidney disease) stage 4, GFR 15-29 ml/min (HCC) 02/20/2014  . Multiple pulmonary nodules 11/01/2013  . Asthmatic bronchitis , chronic (HCC)   FEV1/FVC nl p bronchodilators  05/03/2013  . Obstructive sleep apnea 05/02/2013     Discussions/Recommendations/Plan:  Continue IV Dilaudid PRN for now. Also on PRN PO Oxy IR and monitor. Would not start any long acting opioids yet.   PT consult noted.   Patient has elected not to go to SNF for rehab. Instead, he will go to his significant other's home in [place], [place].   Discussions with son in person and hospice on the phone regarding difference between home with hospice versus home with home health care.   Patient still has some more radiation treatments left, which are palliative in nature.   Patient states he might still want to try PT, but also states he is very much confused with all of the above, would like to have pain medicine soon.        Code Status:    Code Status Orders  (From admission, onward)         Start     Ordered   05/25/19 0036  Full code  Continuous     05/25/19 0037        Code Status History    Date Active Date Inactive Code Status Order ID Comments User Context   10/07/2015 1856 10/10/2015 1352 Full Code [id number]  Kathrynn Speed Inpatient    02/20/2014 2021 02/23/2014 2012 Full Code [id number]  Theodis Blaze, MD Inpatient   Advance Care Planning Activity       Prognosis:   Guarded  Discharge Planning:  To Be Determined  Care plan was discussed with patient, son and hospice.   Thank you for allowing the Palliative Medicine Team to assist in the care of this patient.   Time In: 11 Time Out: 11.35 Total Time 35 Prolonged Time Billed  No      Greater than 50%  of this time was spent counseling and coordinating care related to the above assessment and plan.  Loistine Chance, MD  Please contact Palliative Medicine Team phone at [phone] for questions and concerns.

## 2019-06-01 NOTE — Progress Notes (Signed)
ATTEMPTED TO SEE PATIENT TIMES 2 ATTEMPTS. PATIENT REFUSED SECONDARY TO PAIN ON FIRST ATTEMPT AND THEN WAS LEAVING FOR RADIATION ON SECOND ATTEMPT.

## 2019-06-01 NOTE — Progress Notes (Addendum)
Manufacturing engineer [facility])  Spoke with Shane Watson, his son, SO Shane Watson yesterday and today to discuss options for hospice services at home.    Currently, the plan is to d/c home to his SO Shane Watson home for EOL care.  The family is discussing options of PT and skilled nursing vs hospice services at home.  The family would like frank answers about what to expect.  Offered input and provided support, acknowledging that the ultimate answer is what Shane Watson wants to do.  He speaks up and says he would like to try PT.  He currently has radiation planned to end next Friday.  Hospice services cannot be started until he completes his palliative radiation.    The family would like to see if another hospice would be willing to start services now.  Will update TOC manager, so other options can be explored.  ACC will continue to be available as needed.  Thank you, Venia Carbon RN, BSN, [facility] Liaison (in [place]) [phone]

## 2019-06-02 ENCOUNTER — Encounter: Payer: Self-pay | Admitting: General Practice

## 2019-06-02 ENCOUNTER — Ambulatory Visit
Admit: 2019-06-02 | Discharge: 2019-06-02 | Disposition: A | Discharge status: Home / Self Care | Payer: Medicare Other | Attending: Radiation Oncology | Admitting: Radiation Oncology

## 2019-06-02 DIAGNOSIS — C7951 Secondary malignant neoplasm of bone: Secondary | ICD-10-CM | POA: Diagnosis not present

## 2019-06-02 LAB — CBC
HCT: 39.3 % (ref 39.0–52.0)
Hemoglobin: 12.4 g/dL — ABNORMAL LOW (ref 13.0–17.0)
MCH: 28.7 pg (ref 26.0–34.0)
MCHC: 31.6 g/dL (ref 30.0–36.0)
MCV: 91 fL (ref 80.0–100.0)
Platelets: 179 10*3/uL (ref 150–400)
RBC: 4.32 MIL/uL (ref 4.22–5.81)
RDW: 16.7 % — ABNORMAL HIGH (ref 11.5–15.5)
WBC: 8.3 10*3/uL (ref 4.0–10.5)
nRBC: 0 % (ref 0.0–0.2)

## 2019-06-02 LAB — BASIC METABOLIC PANEL
Anion gap: 10 (ref 5–15)
BUN: 46 mg/dL — ABNORMAL HIGH (ref 8–23)
CO2: 29 mmol/L (ref 22–32)
Calcium: 9.2 mg/dL (ref 8.9–10.3)
Chloride: 89 mmol/L — ABNORMAL LOW (ref 98–111)
Creatinine, Ser: 1.45 mg/dL — ABNORMAL HIGH (ref 0.61–1.24)
GFR calc Af Amer: 55 mL/min — ABNORMAL LOW (ref >60)
GFR calc non Af Amer: 47 mL/min — ABNORMAL LOW (ref >60)
Glucose, Bld: 125 mg/dL — ABNORMAL HIGH (ref 70–99)
Potassium: 5.4 mmol/L — ABNORMAL HIGH (ref 3.5–5.1)
Sodium: 128 mmol/L — ABNORMAL LOW (ref 135–145)

## 2019-06-02 LAB — MAGNESIUM: Magnesium: 2.5 mg/dL — ABNORMAL HIGH (ref 1.7–2.4)

## 2019-06-02 LAB — GLUCOSE, CAPILLARY
Glucose-Capillary: 108 mg/dL — ABNORMAL HIGH (ref 70–99)
Glucose-Capillary: 190 mg/dL — ABNORMAL HIGH (ref 70–99)
Glucose-Capillary: 120 mg/dL — ABNORMAL HIGH (ref 70–99)
Glucose-Capillary: 209 mg/dL — ABNORMAL HIGH (ref 70–99)

## 2019-06-02 MED ORDER — FUROSEMIDE 10 MG/ML IJ SOLN
80.0000 mg | Freq: Once | INTRAMUSCULAR | Status: AC
  Administered 2019-06-02: 80 mg via INTRAVENOUS
  Filled 2019-06-02: qty 8

## 2019-06-02 MED ORDER — DOXAZOSIN MESYLATE 1 MG PO TABS
8.0000 mg | ORAL_TABLET | Freq: Every day | ORAL | Status: DC
  Administered 2019-06-02 – 2019-06-03 (×2): 8 mg via ORAL
  Filled 2019-06-02: qty 8
  Filled 2019-06-02: qty 2

## 2019-06-02 MED ORDER — LIDOCAINE HCL URETHRAL/MUCOSAL 2 % EX GEL
1.0000 "application " | Freq: Once | CUTANEOUS | Status: AC
  Administered 2019-06-02: 1 via URETHRAL
  Filled 2019-06-02: qty 5

## 2019-06-02 MED ORDER — SENNA 8.6 MG PO TABS
1.0000 | ORAL_TABLET | Freq: Every day | ORAL | Status: DC
  Administered 2019-06-02 – 2019-06-05 (×3): 8.6 mg via ORAL
  Filled 2019-06-02 (×3): qty 1

## 2019-06-02 NOTE — Evaluation (Signed)
Occupational Therapy Evaluation Patient Details Name: Shane Watson MRN: [id number] DOB: 1945-11-07 Today's Date: 06/02/2019    History of Present Illness Pt is63 year old male with history of hypertension, chronic back pain/neuropathy, BPH, CKD stage III, COPD, CAD, diabetes mellitus, GERD, obstructive sleep apnea on CPAP.  Pt admitted with persistant back pain with lytic bony lesions secondary to metastatic adenocarcinoma.  Pt had doppler U/S of bil LE that was negative for DVT.  Pts plan is to go home with hospice.   Clinical Impression   Pt seen for OT eval this date to assess pt safety and tolerance for performance of self care ADLs and ADL mobility/transfers. Pt lived at home I'ly prior to admit. Currently, pt requires MAX A with LB ADLs including toileting and dressing secondary to B LE swelling and decreased ROM required to complete dressing tasks/thorough peri care. Pt demos ability to complete transfers and fxl mobility with RW with MIN A. Overall, pt will require 24/7 supv upon d/c from hospital for safety. OT recommends SNF, but home with hospice is a potential disposition and is to hopefully be discussed between [name] and pt's son this date. Will continue to follow for skilled OT while pt in acute setting.    Follow Up Recommendations  SNF;Supervision/Assistance - 24 hour(SNF versus home with hospice, pt and son to discuss options with CSW this date. Pt needs 24/7 supervision regardless of setting.)    Equipment Recommendations  3 in 1 bedside commode;Hospital bed    Recommendations for Other Services       Precautions / Restrictions Precautions Precautions: Fall Restrictions Weight Bearing Restrictions: No      Mobility Bed Mobility Overal bed mobility: Needs Assistance Bed Mobility: Supine to Sit     Supine to sit: Min assist     General bed mobility comments: MIN A to manage LEs, HOB elevated  Transfers Overall transfer level: Needs assistance Equipment used:  Rolling walker (2 wheeled) Transfers: Sit to/from Stand Sit to Stand: Min assist         General transfer comment: Pt requires MIN/MOD verbal/tactile/visual cues for safe hand/foot palcement with RW. At one point, pt anxious to stand and "adjust himself" while OT attempting to move catheter bag and pt attempts to use OT's back to push to stand.    Balance Overall balance assessment: Needs assistance Sitting-balance support: Bilateral upper extremity supported;Feet supported Sitting balance-Leahy Scale: Good     Standing balance support: Bilateral upper extremity supported Standing balance-Leahy Scale: Fair                             ADL either performed or assessed with clinical judgement   ADL Overall ADL's : Needs assistance/impaired Eating/Feeding: Set up;Sitting   Grooming: Wash/dry hands;Wash/dry face;Oral care;Set up;Sitting   Upper Body Bathing: Minimal assistance;Sitting   Lower Body Bathing: Moderate assistance;Sit to/from stand   Upper Body Dressing : Minimal assistance;Sitting   Lower Body Dressing: Maximal assistance;Sit to/from stand Lower Body Dressing Details (indicate cue type and reason): pt demos decreased LE ROM tolerance for self care at this time d/t LE edema and c/o back pain. Toilet Transfer: Minimal Theatre stage manager Details (indicate cue type and reason): based on clinical observation Toileting- Clothing Manipulation and Hygiene: Moderate assistance;Sit to/from stand Toileting - Clothing Manipulation Details (indicate cue type and reason): based on clinical observation-pt with limited ROM and large abdominal circumference. Tub/ Shower Transfer: Minimal Scientist, research (physical sciences) Details (indicate cue  type and reason): based on clinical observation Functional mobility during ADLs: Min guard;Minimal assistance;Rolling walker(to take 5-6 short/shuffling steps from EOB to adjacent chair)       Vision  Baseline Vision/History: Wears glasses Wears Glasses: At all times Patient Visual Report: No change from baseline       Perception     Praxis      Pertinent Vitals/Pain Pain Assessment: 0-10(Simultaneous filing. User may not have seen previous data.) Pain Score: 7  Faces Pain Scale: Hurts even more Pain Location: back hurting during OT eval, pt also c/o discomfort near newly placed catheter site.(Simultaneous filing. User may not have seen previous data.) Pain Descriptors / Indicators: Sore;Grimacing Pain Intervention(s): Limited activity within patient's tolerance;Monitored during session;Repositioned(notified nursing about catheter discomfort. Simultaneous filing. User may not have seen previous data.)     Hand Dominance Right   Extremity/Trunk Assessment Upper Extremity Assessment Upper Extremity Assessment: Overall WFL for tasks assessed   Lower Extremity Assessment Lower Extremity Assessment: Overall WFL for tasks assessed(weeping skin, pitting edema of LEs. Not formally assessed.)   Cervical / Trunk Assessment Cervical / Trunk Assessment: Normal   Communication Communication Communication: No difficulties   Cognition Arousal/Alertness: Awake/alert Behavior During Therapy: Anxious Overall Cognitive Status: Within Functional Limits for tasks assessed                                 General Comments: increased processing time noted.      Exercises Other Exercises Other Exercises: OT facilitates education with pt re: d/c recommendations, safety/fall prevention considerations. Pt's son present in room, but on phone making arrangements for his father throughout session. Pt with confusion re: best course of action for d/c. Defers to his son x3 instances. Poor to moderate reception, would benefit from follow up education. Other Exercises: OT facilitates education re: PLB to regulate WOB during rest breaks with activity. Pt demos understanding, requires  reinforcement. OT attempts to engage pt in modified LB dressing ADL education as pt cannot I'ly don socks at start of session. However, limited attn from pt on this topic at time of eval. Would benefit greatly from f/u if home with hospice is ultimate d/c decision between pt and family to promote Independence.    Shoulder Instructions      Home Living Family/patient expects to be discharged to:: Unsure(SNF versus home with hospice potentially-son assisting pt in working out details for d/c.) Living Arrangements: Alone Available Help at Discharge: Other (Comment)   Home Access: Stairs to enter Entrance Stairs-Number of Steps: 1 Entrance Stairs-Rails: None Home Layout: One level     Bathroom Shower/Tub: Teacher, early years/pre: Standard     Home Equipment: Grab bars - tub/shower;Cane - single point          Prior Functioning/Environment Level of Independence: Independent        Comments: Pt reports he could ambulate in community, drive, and perform ADL/IADLs prior to admission. Did not use AD prior.        OT Problem List: Decreased strength;Decreased range of motion;Decreased activity tolerance;Impaired balance (sitting and/or standing);Decreased safety awareness;Decreased knowledge of use of DME or AE;Decreased knowledge of precautions;Obesity;Pain;Increased edema      OT Treatment/Interventions: Self-care/ADL training;Therapeutic exercise;Energy conservation;Therapeutic activities;Patient/family education;Balance training    OT Goals(Current goals can be found in the care plan section) Acute Rehab OT Goals Patient Stated Goal: To get to breathing and moving around better OT Goal Formulation:  With patient Time For Goal Achievement: 06/16/19 Potential to Achieve Goals: Good  OT Frequency: Min 2X/week   Barriers to D/C:            Co-evaluation              AM-PAC OT "6 Clicks" Daily Activity     Outcome Measure Help from another person eating  meals?: None Help from another person taking care of personal grooming?: None Help from another person toileting, which includes using toliet, bedpan, or urinal?: A Lot Help from another person bathing (including washing, rinsing, drying)?: A Lot Help from another person to put on and taking off regular upper body clothing?: A Little Help from another person to put on and taking off regular lower body clothing?: A Lot 6 Click Score: 17   End of Session Equipment Utilized During Treatment: Gait belt;Rolling walker Nurse Communication: (notified pt's catheter uncomfortable and notified CNA that pt needing bed change while seated up in chair.)  Activity Tolerance: Patient tolerated treatment well(some SOB throughout, 3 sit<>stands, requires 1-2 minutes sitting for RR to return to more normal rate. OT guides pt in PLB.) Patient left: in chair;with call bell/phone within reach;with chair alarm set;with family/visitor present  OT Visit Diagnosis: Unsteadiness on feet (R26.81);Muscle weakness (generalized) (M62.81)                Time: 3329-5188 OT Time Calculation (min): 38 min Charges:  OT General Charges $OT Visit: 1 Visit OT Evaluation $OT Eval Moderate Complexity: 1 Mod OT Treatments $Self Care/Home Management : 8-22 mins $Therapeutic Activity: 8-22 mins  Gerrianne Scale, MS, OTR/L Pager [phone] 06/02/19, 3:08 PM

## 2019-06-02 NOTE — Progress Notes (Signed)
[facility] Therapy Treatment Record Phone [phone]   Radiation Therapy was administered to Shane Watson on: 06/02/2019  3:08 PM and was treatment # **5* out of a planned course of 10 treatments.  Radiation Treatment  1). Beam photons with 6-10 energy  2). Brachytherapy None  3). Stereotactic Radiosurgery None  4). Other Radiation None     Lui Bellis F Joselle Deeds, RT (T)

## 2019-06-02 NOTE — TOC Progression Note (Signed)
Transition of Care [facility]) - Progression Note    Patient Details  Name: LAURIS SERVISS MRN: [id number] Date of Birth: Aug 12, 1945  Transition of [facility]) CM/SW Contact  Purcell Mouton, RN Phone Number: 06/02/2019, 1:36 PM  Clinical Narrative:    Pt will transport to [facility], [place], [place], [place], [place] [address].  Expected Discharge Plan: [place] Barriers to Discharge: Continued Medical Work up  Expected Discharge Plan and Services Expected Discharge Plan: [place] In-house Referral: Clinical Social Work   Post Acute Care Choice: [place] arrangements for the past 2 months: Apartment                                       Social Determinants of Health (SDOH) Interventions    Readmission Risk Interventions No flowsheet data found.

## 2019-06-02 NOTE — TOC Progression Note (Signed)
Transition of Care [facility]) - Progression Note    Patient Details  Name: Shane Watson MRN: [id number] Date of Birth: Oct 15, 1945  Transition of Care [facility]) CM/SW Contact  Purcell Mouton, RN Phone Number: 06/02/2019, 2:36 PM  Clinical Narrative:    Son Herbie [place] states that he has transportation for two days ans asked for information for other day for after pt is discharged. Spoke with Lelon Frohlich, [place] at [facility] to ask for information concerning transportation assistance after discharge. Lucy Antigua for calling transportation assist for pt.   Expected Discharge Plan: [place] Barriers to Discharge: Continued Medical Work up  Expected Discharge Plan and Services Expected Discharge Plan: [place] In-house Referral: Clinical Social Work   Post Acute Care Choice: [place] arrangements for the past 2 months: Apartment                                       Social Determinants of Health (SDOH) Interventions    Readmission Risk Interventions No flowsheet data found.

## 2019-06-02 NOTE — TOC Progression Note (Signed)
Transition of Care [facility]) - Progression Note    Patient Details  Name: Shane Watson MRN: [id number] Date of Birth: [birth year]  Transition of Care [facility]) CM/SW Contact  Purcell Mouton, RN Phone Number: 06/02/2019, 1:12 PM  Clinical Narrative:    Pt and son Herbie [place] at bedside selected [facility] [place]. Nunzio Cory, RN with [place] is aware. Plan to discharge in AM related to DME not being delivered until today. IM GIVEN and explained TO SON.    Expected Discharge Plan: [place] Barriers to Discharge: Continued Medical Work up  Expected Discharge Plan and Services Expected Discharge Plan: [place] In-house Referral: Clinical Social Work   Post Acute Care Choice: [place] arrangements for the past 2 months: Apartment                                       Social Determinants of Health (SDOH) Interventions    Readmission Risk Interventions No flowsheet data found.

## 2019-06-02 NOTE — Progress Notes (Signed)
[place] CSW Progress Notes  Call from inpatient care manager - Cookie McGibboney.  Per RN CM, patient's son wants to know if there is help for wheelchair transport for patient to upcoming radiation appointments at [facility].  CSW sent request to Transportation Coordinator - request will need be be reviewed. Patient lives by himself, but will be living w friend at discharge.  However, friend is unable to "physically help him" get into/out of car. PT evaluation states that he will use a rolling walker to ambulate.  Son lives out of state and will be unable to assist.  Per son, father cannot get into/out of car by himself.  There is a ramp at the home that can be used to get wheelchair into/out of home.  Patient will be moving to [place], [place].  Does not have Medicaid, only Medicare A and B.   Son is trying to arrange "a couple of the other days - most challenging days are Wednesday, Thursday.  Monday is already covered, son is working on Tuesday and Friday transport.  Stressed to son that someone needs to be at home to help patient get into/out of the home and into vehicle.  Son is working on [employer] to assist.  Reviewed request w Solicitor - patient must be able to walk from the wheelchair/hold their own bodyweight on two legs.  Drivers are only allowed to minimally assist patients into the car.    Spoke w Alphonzo Grieve, Engineer, building services at [facility].  For safety reasons, appears that wheelchair transport in wheelchair equipped [facility]i is safest option for transport needs.  She is agreeable to this cost.  Di Kindle, Solicitor, is calling son to confirm arrangements.  Stresses there must be someone at the home at pick up and drop off in order to get patient to the wheelchair van.  Edwyna Shell, LCSW Clinical Social Worker Phone:  [phone]

## 2019-06-02 NOTE — Progress Notes (Addendum)
Physical Therapy Treatment Patient Details Name: Shane Watson MRN: [id number] DOB: [birth year] Today's Date: 06/02/2019    History of Present Illness Pt is35 year old male with history of hypertension, chronic back pain/neuropathy, BPH, CKD stage III, COPD, CAD, diabetes mellitus, GERD, obstructive sleep apnea on CPAP.  Pt admitted with persistant back pain with lytic bony lesions secondary to metastatic adenocarcinoma.  Pt had doppler U/S of bil LE that was negative for DVT.  Pts plan is to go home with hospice.    PT Comments    Pt expressing anxiety about going home tomorrow.  Spoke with pt and family regarding mobility suggestions/tips for at home.  Pt able to demonstrate OOB transfers with supervision, but fatigues easily and limited by pain.  Will cont to benefit from PT while hospitalized.     Follow Up Recommendations  SNF -plan is to now take pt home with hospice (recommend 24 hr supv as able)     Equipment Recommendations  Rolling walker with 5" wheels    Recommendations for Other Services       Precautions / Restrictions Precautions Precautions: Fall Restrictions Weight Bearing Restrictions: No    Mobility  Bed Mobility Overal bed mobility: Needs Assistance Bed Mobility: Sit to Supine     Supine to sit: Min assist     General bed mobility comments: min A for bil LE  Transfers Overall transfer level: Needs assistance Equipment used: Rolling walker (2 wheeled) Transfers: Sit to/from Stand Sit to Stand: Supervision         General transfer comment: sit to stand x 4 with supervision, pt pushed up without cues;  did require max A for toielting ADLs (pt reports back to painful to twist to wipe)  Ambulation/Gait   Gait Distance (Feet): 6 Feet Assistive device: Rolling walker (2 wheeled) Gait Pattern/deviations: Wide base of support;Antalgic     General Gait Details: ambulated 6'x2 (to bsc and to bed); pt fatigued after sit to stands and 15 mins on  BSC, unable to ambulate further   Stairs             Wheelchair Mobility    Modified Rankin (Stroke Patients Only)       Balance Overall balance assessment: Needs assistance Sitting-balance support: Bilateral upper extremity supported;Feet supported Sitting balance-Leahy Scale: Good     Standing balance support: Bilateral upper extremity supported Standing balance-Leahy Scale: Fair                              Cognition Arousal/Alertness: Awake/alert Behavior During Therapy: Anxious Overall Cognitive Status: Within Functional Limits for tasks assessed                                 General Comments: increased processing time noted.      Exercises      General Comments General comments (skin integrity, edema, etc.): Spoke with pt, son, and other family regarding d/c disposition/home concerns.  Pt is getting lift chair, ramp, w/c, hospital, bed, bsc, and RW.  He will have hospice.  Family looking into private care to additionally assist, but pt will have some time alone.  Discussed ideally 24 hr supervision would be best.  Their biggest concern was how pt could get to bathroom and back if alone for a few hours.  Discussed pt able to stand and take a few steps to bsc independently (supervision  in hospital).  Recommended putting bsc directly beside recliner and pt in recliner if alone.  Pt did have difficulty with toielting ADLs - showed family wiping aide online that could be ordered as needed.  Pt is getting transportation home, but has a couple appointments next week for which he does not have transportation - discussed talking to case manager about options.   From PT perspective, pt would need w/c transportation to appointments for safety.  He would have difficulty getting into/out of car due to decreased mobility and back pain.  Additionally, unsafe to ambulate down a ramp, steps, or short community distances as he fatigues easily.         Pertinent Vitals/Pain Pain Assessment: 0-10(Simultaneous filing. User may not have seen previous data.) Pain Score: 7  Faces Pain Scale: Hurts even more Pain Location: back hurting during OT eval, pt also c/o discomfort near newly placed catheter site.(Simultaneous filing. User may not have seen previous data.) Pain Descriptors / Indicators: Sore;Grimacing Pain Intervention(s): Limited activity within patient's tolerance;Monitored during session;Repositioned(notified nursing about catheter discomfort. Simultaneous filing. User may not have seen previous data.)    Home Living Family/patient expects to be discharged to:: Unsure(SNF versus home with hospice potentially-son assisting pt in working out details for d/c.) Living Arrangements: Alone Available Help at Discharge: Other (Comment)   Home Access: Stairs to enter Entrance Stairs-Rails: None Home Layout: One level Home Equipment: Grab bars - tub/shower;Cane - single point      Prior Function Level of Independence: Independent      Comments: Pt reports he could ambulate in community, drive, and perform ADL/IADLs prior to admission. Did not use AD prior.   PT Goals (current goals can now be found in the care plan section) Progress towards PT goals: Progressing toward goals    Frequency    Min 2X/week      PT Plan Discharge plan needs to be updated(pt going home with hospice)    Co-evaluation              AM-PAC PT "6 Clicks" Mobility   Outcome Measure  Help needed turning from your back to your side while in a flat bed without using bedrails?: A Little Help needed moving from lying on your back to sitting on the side of a flat bed without using bedrails?: A Little Help needed moving to and from a bed to a chair (including a wheelchair)?: None Help needed standing up from a chair using your arms (e.g., wheelchair or bedside chair)?: None Help needed to walk in hospital room?: None Help needed climbing 3-5 steps  with a railing? : A Lot 6 Click Score: 20    End of Session Equipment Utilized During Treatment: Gait belt Activity Tolerance: Patient limited by pain;Patient limited by fatigue Patient left: in bed;with bed alarm set;with call bell/phone within reach Nurse Communication: Mobility status PT Visit Diagnosis: Unsteadiness on feet (R26.81);Muscle weakness (generalized) (M62.81)     Time: 1312-1400 PT Time Calculation (min) (ACUTE ONLY): 48 min  Charges:  $Gait Training: 8-22 mins((some non-skilled therapy time waiting on pt on bsc)) $Therapeutic Activity: 8-22 mins                     Maggie Font, PT Acute Rehab Services Pager [phone] [facility] [place]    Karlton Lemon 06/02/2019, 2:23 PM

## 2019-06-02 NOTE — Progress Notes (Addendum)
PROGRESS NOTE    Shane Watson    Code Status: DNR  VHQ:[id number] DOB: 1945-09-23 DOA: 06/12/2019  PCP: Shirline Frees, MD    Hospital Summary   73 year old male with history of hypertension, chronic back pain/neuropathy, BPH, CKD stage III, drug-eluting stent 12/18/2010, COPD, CAD, diabetes mellitus, GERD, obstructive sleep apnea on CPAP evaluated by PCPfew weeks back forback pain/abdominal pain, underwent outpatient work upwithCT scan of the abdomen pelvis on 10/12showinglower mediastinal and abdominal/pelvic lymphadenopathy related to lymphoma or metastatic disease as well astiny pulmonary nodules in the right lung base which were stable since the prior study->PET scan performed on 05/04/2019. PET scan results showed hypermetabolic mediastinal prevascular nodes and hypermetabolic retroperitoneal and mesenteric nodes consistent with lymphoma versus granulomatous disease (sarcoidosis),lytic lesion in the T7 vertebral body with moderate metabolic activity concerning for skeletal metastasis versus sarcoid lesion, small pulmonary nodules do not have associated metabolic activity-->patient underwent CT-guided retroperitoneal lymph node biopsy by IR on 10/22and postprocedure complained of pain radiating to his groin-NSAIDs were recommended. Pathology results indicate undifferentiated adenocarcinoma-->He was referred for oncology evaluation and was scheduled to seeDr Irene Limbo on 11/10.  Patient had worsening back pain and presented to Urgent care on11/2and prescribed 5mg  Oxycodone-1 tab was not enough and 2 tabs were making him too drowsy. Patientwas alsoseen by his primary pulmonologistrecently as outpatient for chest/abdominal pain complaints- felt to havesubdiaphragmatic pain/bloating and dietary changes were advised. Last colonoscopy was 3 years back.  [facility] complaints of persistentbackpainradiating to left anterior abdomen.He also reports  worsening lower extremity edema.   He has since been evaluated by palliative care for goals of care pain management and has underwent 5/10 radiation treatments.  A & P   Principal Problem:   ARF (acute renal failure) (HCC) Active Problems:   CKD (chronic kidney disease) stage 4, GFR 15-29 ml/min (HCC)   Coronary artery disease due to lipid rich plaque   COPD (chronic obstructive pulmonary disease) (HCC)   Back pain   Diastolic CHF (HCC)   Adenocarcinoma, 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HCC)   Bone metastases ([place])   Counseling regarding advance care planning and goals of care   Intractable back pain secondary to pleuritic bony lesions metastatic adenocarcinoma with unknown primary -Patient to likely go home over the weekend with hospice of the [place], intends to complete palliative radiation treatments.  Palliative recommended continuation of as needed p.o. OxyIR.  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  AFP, CEA, hCG, PSA normal. Per Dr. Irene Limbo, oncology, this is incurable and possible options are palliative.  Started XRT 11/9, currently day 5/10 -Plan as above -Continue following with radiation oncology, palliative care, and case manager  Diastolic heart failure exacerbation EF 50 to 55%. Weights improving 157.9 ->161->160 ->159kg today following diuresis.  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  -BP Lasix 80 mg IV x1 -Continue nebulizer treatments  Abdominal pain secondary to  urinary retention from post obstruction had Foley placed the other day but stated he had significant pain.  Since he was removed patient has had minimal  urine output.  Bladder scan today inaccurate.  Coud catheter placed with significant urine output and improvement in abdominal pain almost immediately. -Continue coud catheter for comfort.  Likely will be discharged with catheter -Lidocaine gel for pain to urethra  COPD exacerbation likely secondary to heart failure exacerbation afebrile without leukocytosis no signs of infection.  Improved on exam however still dyspneic -DuoNeb every 6 hours -Albuterol neb every 4 hours as needed -Dulera twice daily -Diuresis as above  Hyponatremia likely heart failure as improving with diuresis, stable at 128 -IV Lasix -Continue to hold HCTZ  Hyperkalemia K5.5->5.4.  Asymptomatic -Continue Lasix and insulin  Type 2 diabetes stable -Continue sliding scale, Tradjenta  AKI on CKD 3 creatinine has plateaued at 1.4-1.5, this is likely baseline.  Without hydronephrosis or renal calculi on CT with extensive stranding in the retroperitoneum and upper abdomen around adrenal glands -Hold HCTZ and losartan -Continue diuresis  History of palpitations started on Cardizem 2019.  Stable -Continue Cardizem  CAD status post DES 2012  -Continue aspirin, statin  Hyperlipidemia -Continue Lipitor  Morbid obesity BMI 60 -Was counseled on diet weight control  GERD stable -Continue current management   DVT prophylaxis: Lovenox Diet: Heart healthy Family Communication: Family updated at bedside Disposition Plan: Likely discharge over the weekend to home with hospice of the [place] to significant other's house.  Patient is to continue with radiation therapy treatments.  Consultants  Palliative Oncology Radiation oncology  Procedures  XRT x5/10      Subjective   Patient resting comfortably in bed.  States his shortness of breath is significantly  improved over the past several days but still feels somewhat short of breath.  Admits to persistent back pain which improves with pain medication.  Admitted to severe abdominal pain and bloating which started today.  Stated that with the Foley catheter and the other day he has significant pain and has not had much urine output since removal of the Foley catheter was confirmed by nursing.  Nurse stated that the bladder scans seemed inaccurate. denies any chest pain, nausea, vomiting.  Remaining review of systems negative Objective   Vitals:   06/02/19 0900 06/02/19 1104 06/02/19 1416 06/02/19 1719  BP: (!) 172/90 (!) 156/53  (!) 178/62  Pulse: 89   80  Resp: 18   18  Temp: 98.4 F (36.9 C)   97.6 F (36.4 C)  TempSrc: Oral   Oral  SpO2: 96%  92% 93%  Weight:      Height:        Intake/Output Summary (Last 24 hours) at 06/02/2019 1808 Last data filed at 06/02/2019 1400 Gross per 24 hour  Intake 600 ml  Output 275 ml  Net 325 ml   Filed Weights   05/31/19 0554 06/01/19 0517 06/02/19 0530  Weight: (!) 161 kg (!) 160.8 kg (!) 159.8 kg    Examination:  Physical Exam Vitals signs and nursing note reviewed.  Constitutional:      Comments: Somnolent but responds appropriately to questioning  HENT:     Head: Normocephalic and atraumatic.     Nose: Nose normal.     Mouth/Throat:     Mouth: Mucous membranes are dry.  Eyes:     Extraocular Movements: Extraocular movements intact.  Neck:     Musculoskeletal: Normal range of motion.  Cardiovascular:     Rate and Rhythm: Normal rate and regular rhythm.  Pulmonary:     Effort: Pulmonary effort is normal.     Comments: Wheezes  diffusely Abdominal:     General: Bowel sounds are normal. There is distension.  Musculoskeletal:     Comments: 2+ bilateral lower extremity pitting edema  Neurological:     Mental Status: Mental status is at baseline.     Comments: Somnolent  Psychiatric:        Mood and Affect: Mood normal.         Thought Content: Thought content normal.     Data Reviewed: I have personally reviewed following labs and imaging studies  CBC: Recent Labs  Lab 05/30/19 0339 05/31/19 0357 06/01/19 0553 06/02/19 0426  WBC 7.2 7.6 7.8 8.3  HGB 11.5* 12.4* 12.4* 12.4*  HCT 36.3* 39.4 39.4 39.3  MCV 91.2 91.4 90.8 91.0  PLT 161 164 159 778   Basic Metabolic Panel: Recent Labs  Lab 05/29/19 0335 05/30/19 0339 05/31/19 0357 06/01/19 0553 06/02/19 0426  NA 128* 127* 126* 128* 128*  K 5.1 5.5* 5.4* 5.5* 5.4*  CL 90* 90* 89* 89* 89*  CO2 31 29 27 30 29   GLUCOSE 122* 125* 123* 127* 125*  BUN 36* 39* 44* 43* 46*  CREATININE 1.52* 1.59* 1.44* 1.45* 1.45*  CALCIUM 9.1 9.1 9.3 9.2 9.2  MG  --   --   --  2.7* 2.5*   GFR: Estimated Creatinine Clearance: 69 mL/min (A) (by C-G formula based on SCr of 1.45 mg/dL (H)). Liver Function Tests: No results for input(s): AST, ALT, ALKPHOS, BILITOT, PROT, ALBUMIN in the last 168 hours. No results for input(s): LIPASE, AMYLASE in the last 168 hours. No results for input(s): AMMONIA in the last 168 hours. Coagulation Profile: No results for input(s): INR, PROTIME in the last 168 hours. Cardiac Enzymes: No results for input(s): CKTOTAL, CKMB, CKMBINDEX, TROPONINI in the last 168 hours. BNP (last 3 results) No results for input(s): PROBNP in the last 8760 hours. HbA1C: No results for input(s): HGBA1C in the last 72 hours. CBG: Recent Labs  Lab 06/01/19 1608 06/01/19 2130 06/02/19 0754 06/02/19 1154 06/02/19 1711  GLUCAP 206* 147* 108* 190* 120*   Lipid Profile: No results for input(s): CHOL, HDL, LDLCALC, TRIG, CHOLHDL, LDLDIRECT in the last 72 hours. Thyroid Function Tests: No results for input(s): TSH, T4TOTAL, FREET4, T3FREE, THYROIDAB in the last 72 hours. Anemia Panel: No results for input(s): VITAMINB12, FOLATE, FERRITIN, TIBC, IRON, RETICCTPCT in the last 72 hours. Sepsis Labs: No results for input(s): PROCALCITON, LATICACIDVEN in the  last 168 hours.  Recent Results (from the past 240 hour(s))  SARS CORONAVIRUS 2 (TAT 6-24 HRS) Nasopharyngeal Nasopharyngeal Swab     Status: None   Collection Time: 05/25/19  1:09 AM   Specimen: Nasopharyngeal Swab  Result Value Ref Range Status   SARS Coronavirus 2 NEGATIVE NEGATIVE Final    Comment: (NOTE) SARS-CoV-2 target nucleic acids are NOT DETECTED. The SARS-CoV-2 RNA is generally detectable in upper and lower respiratory specimens during the acute phase of infection. Negative results do not preclude SARS-CoV-2 infection, do not rule out co-infections with other pathogens, and should not be used as the sole basis for treatment or other patient management decisions. Negative results must be combined with clinical observations, patient history, and epidemiological information. The expected result is Negative. Fact Sheet for Patients: SugarRoll.be Fact Sheet for Healthcare Providers: https://www.woods-mathews.com/ This test is not yet approved or cleared by the [place] FDA and  has been authorized for detection and/or diagnosis of SARS-CoV-2 by FDA under an Emergency Use Authorization (EUA). This EUA will remain  in effect (  meaning this test can be used) for the duration of the COVID-19 declaration under Section 56 4(b)(1) of the Act, 21 U.S.C. section 360bbb-3(b)(1), unless the authorization is terminated or revoked sooner. Performed at [facility], [place] [address] [place], [place] [address]          Radiology Studies: No results found.      Scheduled Meds: . aspirin EC  81 mg Oral Daily  . atorvastatin  40 mg Oral Daily  . Chlorhexidine Gluconate Cloth  6 each Topical Daily  . colesevelam  625 mg Oral BID WC  . diltiazem  90 mg Oral Daily  . doxazosin  8 mg Oral QHS  . enoxaparin (LOVENOX) injection  80 mg Subcutaneous Q24H  . famotidine  20 mg Oral QHS  . furosemide  80 mg Intravenous Once  .  gabapentin  300 mg Oral BID  . insulin aspart  0-15 Units Subcutaneous TID WC  . insulin aspart  0-5 Units Subcutaneous QHS  . ipratropium-albuterol  3 mL Nebulization Q6H  . lidocaine  3 patch Transdermal Q24H  . lidocaine  1 application Urethral Once  . linagliptin  5 mg Oral Daily  . loratadine  10 mg Oral Daily  . magnesium oxide  400 mg Oral Daily  . meclizine  25 mg Oral Daily  . mometasone-formoterol  2 puff Inhalation BID  . pantoprazole  40 mg Oral Daily  . polyethylene glycol  17 g Oral Daily  . senna  1 tablet Oral Daily   Continuous Infusions:   LOS: 8 days    Time spent: 40 minutes with over 50% of the time coordinating the patient's care    Harold Hedge, DO [facility] Pager [phone]  If 7PM-7AM, please contact night-coverage [email or web address] Password [facility] 06/02/2019, 6:08 PM

## 2019-06-02 NOTE — Care Management Important Message (Signed)
Important Message  Patient Details  Name: Shane Watson MRN: [id number] Date of Birth: 12-Feb-1946   Medicare Important Message Given:  Yes(Given to pt's son Herbie [place].)     Purcell Mouton, RN 06/02/2019, 1:33 PM

## 2019-06-02 NOTE — Progress Notes (Signed)
PMT no charge note  Followed up with [facility] Ms Matador regarding the patient's disposition. Patient to go home over the weekend with hospice of the [place], intends to complete   palliative radiation treatments. Recommend continuation of PRN PO OxyIR. Patient has CHF, COPD, likely also with OSA/OHS and tenuous respiratory status. Will likely need opioids up titrated once radiation is completed and he is enrolled in full hospice care.   No charge Loistine Chance MD [facility] health palliative [phone]

## 2019-06-03 ENCOUNTER — Inpatient Hospital Stay (HOSPITAL_COMMUNITY): Payer: Medicare Other

## 2019-06-03 DIAGNOSIS — E871 Hypo-osmolality and hyponatremia: Secondary | ICD-10-CM

## 2019-06-03 DIAGNOSIS — E875 Hyperkalemia: Secondary | ICD-10-CM

## 2019-06-03 LAB — BASIC METABOLIC PANEL
Anion gap: 11 (ref 5–15)
Anion gap: 7 (ref 5–15)
Anion gap: 9 (ref 5–15)
Anion gap: 9 (ref 5–15)
Anion gap: 7 (ref 5–15)
BUN: 5 mg/dL — ABNORMAL LOW (ref 8–23)
BUN: 53 mg/dL — ABNORMAL HIGH (ref 8–23)
BUN: 54 mg/dL — ABNORMAL HIGH (ref 8–23)
BUN: 57 mg/dL — ABNORMAL HIGH (ref 8–23)
BUN: 54 mg/dL — ABNORMAL HIGH (ref 8–23)
CO2: 16 mmol/L — ABNORMAL LOW (ref 22–32)
CO2: 30 mmol/L (ref 22–32)
CO2: 30 mmol/L (ref 22–32)
CO2: 28 mmol/L (ref 22–32)
CO2: 31 mmol/L (ref 22–32)
Calcium: 7.7 mg/dL — ABNORMAL LOW (ref 8.9–10.3)
Calcium: 9.3 mg/dL (ref 8.9–10.3)
Calcium: 9.1 mg/dL (ref 8.9–10.3)
Calcium: 9 mg/dL (ref 8.9–10.3)
Calcium: 9.2 mg/dL (ref 8.9–10.3)
Chloride: 111 mmol/L (ref 98–111)
Chloride: 88 mmol/L — ABNORMAL LOW (ref 98–111)
Chloride: 89 mmol/L — ABNORMAL LOW (ref 98–111)
Chloride: 89 mmol/L — ABNORMAL LOW (ref 98–111)
Chloride: 88 mmol/L — ABNORMAL LOW (ref 98–111)
Creatinine, Ser: 0.64 mg/dL (ref 0.61–1.24)
Creatinine, Ser: 1.59 mg/dL — ABNORMAL HIGH (ref 0.61–1.24)
Creatinine, Ser: 1.62 mg/dL — ABNORMAL HIGH (ref 0.61–1.24)
Creatinine, Ser: 1.63 mg/dL — ABNORMAL HIGH (ref 0.61–1.24)
Creatinine, Ser: 1.61 mg/dL — ABNORMAL HIGH (ref 0.61–1.24)
GFR calc Af Amer: 49 mL/min — ABNORMAL LOW (ref >60)
GFR calc Af Amer: >60 mL/min (ref >60)
GFR calc Af Amer: 48 mL/min — ABNORMAL LOW (ref >60)
GFR calc Af Amer: 47 mL/min — ABNORMAL LOW (ref >60)
GFR calc Af Amer: 48 mL/min — ABNORMAL LOW (ref >60)
GFR calc non Af Amer: 42 mL/min — ABNORMAL LOW (ref >60)
GFR calc non Af Amer: >60 mL/min (ref >60)
GFR calc non Af Amer: 41 mL/min — ABNORMAL LOW (ref >60)
GFR calc non Af Amer: 41 mL/min — ABNORMAL LOW (ref >60)
GFR calc non Af Amer: 42 mL/min — ABNORMAL LOW (ref >60)
Glucose, Bld: 142 mg/dL — ABNORMAL HIGH (ref 70–99)
Glucose, Bld: 77 mg/dL (ref 70–99)
Glucose, Bld: 139 mg/dL — ABNORMAL HIGH (ref 70–99)
Glucose, Bld: 177 mg/dL — ABNORMAL HIGH (ref 70–99)
Glucose, Bld: 196 mg/dL — ABNORMAL HIGH (ref 70–99)
Potassium: 3.3 mmol/L — ABNORMAL LOW (ref 3.5–5.1)
Potassium: 6 mmol/L — ABNORMAL HIGH (ref 3.5–5.1)
Potassium: 5.9 mmol/L — ABNORMAL HIGH (ref 3.5–5.1)
Potassium: 7 mmol/L (ref 3.5–5.1)
Potassium: 6.1 mmol/L — ABNORMAL HIGH (ref 3.5–5.1)
Sodium: 129 mmol/L — ABNORMAL LOW (ref 135–145)
Sodium: 134 mmol/L — ABNORMAL LOW (ref 135–145)
Sodium: 128 mmol/L — ABNORMAL LOW (ref 135–145)
Sodium: 126 mmol/L — ABNORMAL LOW (ref 135–145)
Sodium: 126 mmol/L — ABNORMAL LOW (ref 135–145)

## 2019-06-03 LAB — URINALYSIS, ROUTINE W REFLEX MICROSCOPIC
Bilirubin Urine: NEGATIVE
Glucose, UA: NEGATIVE mg/dL
Ketones, ur: NEGATIVE mg/dL
Nitrite: NEGATIVE
Protein, ur: 30 mg/dL — AB
RBC / HPF: >50 RBC/hpf — ABNORMAL HIGH (ref 0–5)
Specific Gravity, Urine: 1.015 (ref 1.005–1.030)
WBC, UA: >50 WBC/hpf — ABNORMAL HIGH (ref 0–5)
pH: 5 (ref 5.0–8.0)

## 2019-06-03 LAB — CREATININE, URINE, RANDOM: Creatinine, Urine: 210.48 mg/dL

## 2019-06-03 LAB — GLUCOSE, CAPILLARY
Glucose-Capillary: 125 mg/dL — ABNORMAL HIGH (ref 70–99)
Glucose-Capillary: 164 mg/dL — ABNORMAL HIGH (ref 70–99)
Glucose-Capillary: 171 mg/dL — ABNORMAL HIGH (ref 70–99)
Glucose-Capillary: 156 mg/dL — ABNORMAL HIGH (ref 70–99)
Glucose-Capillary: 118 mg/dL — ABNORMAL HIGH (ref 70–99)

## 2019-06-03 LAB — POTASSIUM
Potassium: 6 mmol/L — ABNORMAL HIGH (ref 3.5–5.1)
Potassium: 6.7 mmol/L (ref 3.5–5.1)

## 2019-06-03 LAB — SODIUM, URINE, RANDOM: Sodium, Ur: <10 mmol/L

## 2019-06-03 LAB — OSMOLALITY, URINE: Osmolality, Ur: 406 mOsm/kg (ref 300–900)

## 2019-06-03 LAB — NA AND K (SODIUM & POTASSIUM), RAND UR
Potassium Urine: 37 mmol/L
Potassium Urine: 28 mmol/L
Sodium, Ur: <10 mmol/L
Sodium, Ur: <10 mmol/L

## 2019-06-03 MED ORDER — ALBUTEROL SULFATE (2.5 MG/3ML) 0.083% IN NEBU: 2.5000 mg | INHALATION_SOLUTION | Freq: Once | RESPIRATORY_TRACT | Status: DC

## 2019-06-03 MED ORDER — DEXTROSE 50 % IV SOLN
1.0000 | Freq: Once | INTRAVENOUS | Status: AC
  Administered 2019-06-03: 50 mL via INTRAVENOUS
  Filled 2019-06-03: qty 50

## 2019-06-03 MED ORDER — GABAPENTIN 100 MG PO CAPS
100.0000 mg | ORAL_CAPSULE | Freq: Three times a day (TID) | ORAL | Status: DC
  Administered 2019-06-03: 100 mg via ORAL
  Filled 2019-06-03 (×2): qty 1

## 2019-06-03 MED ORDER — CALCIUM GLUCONATE-NACL 1-0.675 GM/50ML-% IV SOLN
1.0000 g | Freq: Once | INTRAVENOUS | Status: AC
  Administered 2019-06-03: 1000 mg via INTRAVENOUS
  Filled 2019-06-03: qty 50

## 2019-06-03 MED ORDER — METOPROLOL TARTRATE 5 MG/5ML IV SOLN
5.0000 mg | Freq: Two times a day (BID) | INTRAVENOUS | Status: DC | PRN
  Administered 2019-06-03 – 2019-06-04 (×2): 5 mg via INTRAVENOUS
  Filled 2019-06-03 (×2): qty 5

## 2019-06-03 MED ORDER — FUROSEMIDE 10 MG/ML IJ SOLN
40.0000 mg | Freq: Once | INTRAMUSCULAR | Status: AC
  Administered 2019-06-03: 40 mg via INTRAVENOUS
  Filled 2019-06-03: qty 4

## 2019-06-03 MED ORDER — SODIUM ZIRCONIUM CYCLOSILICATE 10 G PO PACK
10.0000 g | PACK | Freq: Three times a day (TID) | ORAL | Status: DC
  Administered 2019-06-04 – 2019-06-05 (×5): 10 g via ORAL
  Filled 2019-06-03 (×7): qty 1

## 2019-06-03 MED ORDER — ORAL CARE MOUTH RINSE
15.0000 mL | Freq: Two times a day (BID) | OROMUCOSAL | Status: DC
  Administered 2019-06-03 – 2019-06-05 (×4): 15 mL via OROMUCOSAL

## 2019-06-03 MED ORDER — INSULIN ASPART 100 UNIT/ML IV SOLN
10.0000 [IU] | Freq: Once | INTRAVENOUS | Status: AC
  Administered 2019-06-03: 10 [IU] via INTRAVENOUS

## 2019-06-03 MED ORDER — FUROSEMIDE 10 MG/ML IJ SOLN
120.0000 mg | Freq: Two times a day (BID) | INTRAVENOUS | Status: DC
  Administered 2019-06-04: 120 mg via INTRAVENOUS
  Filled 2019-06-03: qty 2
  Filled 2019-06-03: qty 12

## 2019-06-03 NOTE — Progress Notes (Signed)
Per MD only give1/2 ampule Dextrose 50% given, CBG 171

## 2019-06-03 NOTE — Progress Notes (Addendum)
PROGRESS NOTE    HAZAIAH EDGECOMBE    Code Status: DNR  PYP:[id number] DOB: [birth year] DOA: 06/17/2019  PCP: Shirline Frees, MD    Hospital Summary   73 year old male with history of hypertension, chronic back pain/neuropathy, BPH, CKD stage III, drug-eluting stent 12/18/2010, COPD, CAD, diabetes mellitus, GERD, obstructive sleep apnea on CPAP evaluated by PCPfew weeks back forback pain/abdominal pain, underwent outpatient work upwithCT scan of the abdomen pelvis on 10/12showinglower mediastinal and abdominal/pelvic lymphadenopathy related to lymphoma or metastatic disease as well astiny pulmonary nodules in the right lung base which were stable since the prior study->PET scan performed on 05/04/2019. PET scan results showed hypermetabolic mediastinal prevascular nodes and hypermetabolic retroperitoneal and mesenteric nodes consistent with lymphoma versus granulomatous disease (sarcoidosis),lytic lesion in the T7 vertebral body with moderate metabolic activity concerning for skeletal metastasis versus sarcoid lesion, small pulmonary nodules do not have associated metabolic activity-->patient underwent CT-guided retroperitoneal lymph node biopsy by IR on 10/22and postprocedure complained of pain radiating to his groin-NSAIDs were recommended. Pathology results indicate undifferentiated adenocarcinoma-->He was referred for oncology evaluation and was scheduled to seeDr Irene Limbo on 11/10.  Patient had worsening back pain and presented to Urgent care on11/2and prescribed 5mg  Oxycodone-1 tab was not enough and 2 tabs were making him too drowsy. Patientwas alsoseen by his primary pulmonologistrecently as outpatient for chest/abdominal pain complaints- felt to havesubdiaphragmatic pain/bloating and dietary changes were advised. Last colonoscopy was 3 years back.  [facility] of persistentbackpainradiating to left anterior abdomen.He also reports  worsening lower extremity edema.   He has since been evaluated by palliative care for goals of care pain management and has underwent 5/10 radiation treatments.  11/14: Plan was for discharge home with hospice today however patient looked more toxic and on lab work patient had hyperkalemia of 6.0.  Did not respond with Lasix.  Albuterol was ordered but was not given.  Repeat lab work showed hyperkalemia of 7.0.  EKG with peaked T waves in anterior leads and normal QT.  Given calcium gluconate, insulin 10 units with dextrose.  Nephrology consulted.  Patient transferred to stepdown unit  A & P   Principal Problem:   ARF (acute renal failure) (HCC) Active Problems:   CKD (chronic kidney disease) stage 4, GFR 15-29 ml/min (HCC)   Coronary artery disease due to lipid rich plaque   COPD (chronic obstructive pulmonary disease) (HCC)   Back pain   Diastolic CHF (HCC)   Adenocarcinoma, 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HCC)   Bone metastases ([place])   Counseling regarding advance care planning and goals of care  Hyperkalemia BMP obtained at 2:45 AM x2 conflicting and on repeat BMP K6.0, first BMP inaccurate.  Albuterol ordered (was not given) with Lasix and repeat BMP this afternoon showing K7.0.  EKG with peaked T waves in V1 through V3.  - calcium gluconate - insulin 10 units with dextrose - Neb treatment - Nephrology consulted - Transfer to step down unit - Telemetry - Hyperkalemia protocol - Consider readdressing GOC discussion  Hyponatremia initially improved with diuresis and peaked at 129-> 126 today -Appreciate nephrology recommendations  Diastolic heart failure exacerbation EF 50 to 55%. Weights initially improved and now plateaued at 159.7 kg after inadequate diuresis, patient with significant lower extremity edema in setting of heart failure exacerbation and hypoalbuminemia.  Foley catheter in place -Appreciate nephro recommendations on volume management -HF protocol  AKI on CKD 3 creatinine 1.63,  baseline around 1.3. -Hold HCTZ and losartan -Hold gabapentin -UA -Nephro consulted  Opiate induced somnolence  -Hold gabapentin in setting of AKI to prevent worsening mentation -Hold oxybutynin to prevent worsening of mentation in setting of AKI  Intractable back pain secondary to pleuritic bony lesions metastatic adenocarcinoma with unknown primary Plan was initially to go home with hospice of the [place] today with intention to complete palliative radiation treatments outpatient however given acute electrolyte abnormalities as above as well as family wishing to pursue treatment, discharge is held off today. - Palliative recommended continuation of as needed p.o. OxyIR.  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  AFP, CEA, hCG, PSA normal. Per Dr. Irene Limbo, oncology, this is incurable and possible options are palliative.  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  -Chest x-ray ordered -Increase to 5L [place]  Abdominal pain secondary to urinary retention from post obstruction Coud catheter placed 11/13 initially with significant urine output and improvement in abdominal pain almost immediately.  Now with minimal urine output -Continue Foley  COPD exacerbation likely secondary to heart failure exacerbation afebrile without leukocytosis no signs of infection.  Improved on exam however still dyspneic -DuoNeb every 6 hours -Albuterol neb every 4 hours as needed -Dulera twice daily -Diuresis as above  Type 2 diabetes stable.  Monitor with insulin and  dextrose as above -Continue sliding scale, Tradjenta  History of palpitations started on Cardizem 2019.   -Discontinue Cardizem in setting of worsening heart failure -Lopressor 5 mg IV PRN for HR >110  CAD status post DES 2012  -Continue aspirin, statin  Hyperlipidemia -Continue Lipitor  Morbid obesity BMI 3 -Was counseled on diet weight control  GERD stable -Continue current management   DVT prophylaxis: Lovenox Diet: Heart healthy Family Communication: Family updated at bedside Disposition Plan: Resources available for patient to be discharged home with hospice however patient with acute electrolyte abnormalities. family's goal to medically treat which is reasonable.  will hold off on discharge.  Patient to transfer to stepdown unit in setting of hyperkalemia  Consultants  Palliative Oncology Radiation oncology  Procedures  XRT x5/10      Subjective   Patient seen and examined at bedside this morning stating that he feels worse today. more lethargic and short of breath.  Also states he has less of an appetite today.  K this a.m. 6.0, albuterol and Lasix ordered.  Albuterol was not given as DuoNeb was given 2 hours prior.  Repeat BMP showing K increased to 7.0.  Treatment plan as above.  Patient's family at bedside.  Objective   Vitals:   06/03/19 0736 06/03/19 0804 06/03/19 1015 06/03/19 1419  BP:   (!) 135/53 (!) 133/51  Pulse:   83 87  Resp:   (!) 21 (!) 21  Temp:   97.7 F (36.5 C) 97.6 F (36.4 C)  TempSrc:   Oral Oral  SpO2: 93% 93% (!) 88% 90%  Weight:      Height:        Intake/Output Summary (Last 24 hours) at 06/03/2019 1430 Last data filed at 06/03/2019 0545 Gross per 24 hour  Intake 30 ml  Output 700 ml  Net -670 ml   Filed  Weights   06/01/19 0517 06/02/19 0530 06/03/19 0500  Weight: (!) 160.8 kg (!) 159.8 kg (!) 159.7 kg    Examination:  Physical Exam Vitals signs and nursing note reviewed. Exam conducted with a chaperone present.    Constitutional:      Appearance: He is diaphoretic.     Comments: Awake, appears uncomfortable.  HENT:     Nose:     Comments: Nasal cannula    Mouth/Throat:     Mouth: Mucous membranes are moist.  Eyes:     Extraocular Movements: Extraocular movements intact.  Cardiovascular:     Rate and Rhythm: Normal rate and regular rhythm.  Pulmonary:     Comments: SPO2 87% on 4 L nasal cannula, increased to low 90s with 5 L nasal cannula  Lung sounds difficult to auscultate given body habitus and patient difficulty to move for proper positioning.  Minimal wheeze auscultated Abdominal:     General: Abdomen is flat.     Palpations: Abdomen is soft.  Genitourinary:    Comments: Foley catheter with cloudy pink-tinged urine Musculoskeletal:     Comments: 3-4+ bilateral lower extremity pitting edema, somewhat improved from yesterday  Neurological:     Comments: Somnolent but conversant  Psychiatric:        Mood and Affect: Mood normal.        Behavior: Behavior normal.     Data Reviewed: I have personally reviewed following labs and imaging studies  CBC: Recent Labs  Lab 05/30/19 0339 05/31/19 0357 06/01/19 0553 06/02/19 0426  WBC 7.2 7.6 7.8 8.3  HGB 11.5* 12.4* 12.4* 12.4*  HCT 36.3* 39.4 39.4 39.3  MCV 91.2 91.4 90.8 91.0  PLT 161 164 159 102   Basic Metabolic Panel: Recent Labs  Lab 06/01/19 0553 06/02/19 0426 06/03/19 0506 06/03/19 0735 06/03/19 1243  NA 128* 128* 129*   134* 128* 126*  K 5.5* 5.4* 6.0*   3.3* 5.9* 7.0*  CL 89* 89* 88*   111 89* 89*  CO2 30 29 30    16* 30 28  GLUCOSE 127* 125* 142*   77 139* 177*  BUN 43* 46* 53*   5* 54* 57*  CREATININE 1.45* 1.45* 1.59*   0.64 1.62* 1.63*  CALCIUM 9.2 9.2 9.3   7.7* 9.1 9.0  MG 2.7* 2.5*  --   --   --    GFR: Estimated Creatinine Clearance: 61.4 mL/min (A) (by C-G formula based on SCr of 1.63 mg/dL (H)). Liver Function Tests: No results for input(s): AST, ALT, ALKPHOS, BILITOT, PROT, ALBUMIN in the last 168  hours. No results for input(s): LIPASE, AMYLASE in the last 168 hours. No results for input(s): AMMONIA in the last 168 hours. Coagulation Profile: No results for input(s): INR, PROTIME in the last 168 hours. Cardiac Enzymes: No results for input(s): CKTOTAL, CKMB, CKMBINDEX, TROPONINI in the last 168 hours. BNP (last 3 results) No results for input(s): PROBNP in the last 8760 hours. HbA1C: No results for input(s): HGBA1C in the last 72 hours. CBG: Recent Labs  Lab 06/02/19 1154 06/02/19 1711 06/02/19 2150 06/03/19 0737 06/03/19 1240  GLUCAP 190* 120* 209* 125* 164*   Lipid Profile: No results for input(s): CHOL, HDL, LDLCALC, TRIG, CHOLHDL, LDLDIRECT in the last 72 hours. Thyroid Function Tests: No results for input(s): TSH, T4TOTAL, FREET4, T3FREE, THYROIDAB in the last 72 hours. Anemia Panel: No results for input(s): VITAMINB12, FOLATE, FERRITIN, TIBC, IRON, RETICCTPCT in the last 72 hours. Sepsis Labs: No results for input(s): PROCALCITON,  LATICACIDVEN in the last 168 hours.  Recent Results (from the past 240 hour(s))  SARS CORONAVIRUS 2 (TAT 6-24 HRS) Nasopharyngeal Nasopharyngeal Swab     Status: None   Collection Time: 05/25/19  1:09 AM   Specimen: Nasopharyngeal Swab  Result Value Ref Range Status   SARS Coronavirus 2 NEGATIVE NEGATIVE Final    Comment: (NOTE) SARS-CoV-2 target nucleic acids are NOT DETECTED. The SARS-CoV-2 RNA is generally detectable in upper and lower respiratory specimens during the acute phase of infection. Negative results do not preclude SARS-CoV-2 infection, do not rule out co-infections with other pathogens, and should not be used as the sole basis for treatment or other patient management decisions. Negative results must be combined with clinical observations, patient history, and epidemiological information. The expected result is Negative. Fact Sheet for Patients: SugarRoll.be Fact Sheet for Healthcare  Providers: https://www.woods-mathews.com/ This test is not yet approved or cleared by the [place] FDA and  has been authorized for detection and/or diagnosis of SARS-CoV-2 by FDA under an Emergency Use Authorization (EUA). This EUA will remain  in effect (meaning this test can be used) for the duration of the COVID-19 declaration under Section 56 4(b)(1) of the Act, 21 U.S.C. section 360bbb-3(b)(1), unless the authorization is terminated or revoked sooner. Performed at [facility], [place] [address], [place], [place] [address]          Radiology Studies: No results found.      Scheduled Meds:  albuterol  2.5 mg Nebulization Once   aspirin EC  81 mg Oral Daily   atorvastatin  40 mg Oral Daily   Chlorhexidine Gluconate Cloth  6 each Topical Daily   colesevelam  625 mg Oral BID WC   insulin aspart  10 Units Intravenous Once   And   dextrose  1 ampule Intravenous Once   diltiazem  90 mg Oral Daily   doxazosin  8 mg Oral QHS   enoxaparin (LOVENOX) injection  80 mg Subcutaneous Q24H   famotidine  20 mg Oral QHS   gabapentin  100 mg Oral TID   insulin aspart  0-15 Units Subcutaneous TID WC   insulin aspart  0-5 Units Subcutaneous QHS   ipratropium-albuterol  3 mL Nebulization Q6H   lidocaine  3 patch Transdermal Q24H   linagliptin  5 mg Oral Daily   loratadine  10 mg Oral Daily   magnesium oxide  400 mg Oral Daily   meclizine  25 mg Oral Daily   mometasone-formoterol  2 puff Inhalation BID   pantoprazole  40 mg Oral Daily   polyethylene glycol  17 g Oral Daily   senna  1 tablet Oral Daily   Continuous Infusions:  calcium gluconate       LOS: 9 days    Time spent: 65 minutes with over 50% of the time coordinating the patient's care    Harold Hedge, DO [facility] Pager [phone]  If 7PM-7AM, please contact night-coverage [email or web address] Password [email or web address] 06/03/2019, 2:30 PM

## 2019-06-03 NOTE — Progress Notes (Signed)
CRITICAL VALUE ALERT  Critical Value:  k 6.7  Date & Time Notied: 06/03/19 23:20   Provider Notified: Dr. Jonnie Finner  Orders Received/Actions taken: Lokelma 10g TID.  Patient already on a renal diet.

## 2019-06-03 NOTE — Progress Notes (Signed)
Patient being transferred to room 1235, report given to [facility]

## 2019-06-03 NOTE — Progress Notes (Signed)
Patient complaining of bladder spasms.  PRN bella donna given.  Pt became more dyspneic with oxygen saturations in the upper 70's after repositioning.  RT at the bedside.  CPAP applied and oxygen increased to 15L.  Will continue to monitor.

## 2019-06-03 NOTE — Progress Notes (Signed)
CRITICAL VALUE ALERT  Critical Value:  Potassium 7.0  Date & Time Notied:  06/03/19  1340  Provider Notified: Dr Neysa Bonito via text page  Orders Received/Actions taken: pending

## 2019-06-03 NOTE — Consult Note (Addendum)
Renal Service Consult Note [facility]  Shane Watson 06/03/2019 Sol Blazing Requesting Physician:  Dr Neysa Bonito  Reason for Consult:  Hyperkalemia, AKI HPI: The patient is a 73 y.o. 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 Had bx on 05/11/19. Admitted now w/ back on 11/4 and seen by ONC who noted that this cancer was incurable and that palliative Rx's might not be well tolerated due to his comoribidities. Iin the meantime patient has been getting XRT. Also dx'd with diast HF , EF 50-55% w/ sig edema and he was starting on IV lasix at 20mg  gradually ^'d up to 80mg  the last few days. Creat was 2.1 on admit, improved to 1.52 then went up to 1.61 today w/ K+ jump to 7.0 today.  Breathing is worse, CXR today shows worsening CHF.  Asked to see for AKI and ^K+.    Pt is confused and poor historian.  Admit wt was 158kg, today is 161kg.  Total I/O's are 5.7 L in and 6.6 L out.   Daily UOP has ranged fom 335 to 1125 recently. Renal US today showed no hydro of either kidney but poor windows. The kidneys on CT earlier were visualized w/o hydro.       ROS  denies CP  no joint pain   no HA  no blurry vision  no rash  no diarrhea  no nausea/ vomiting    Past Medical History  Past Medical History:  Diagnosis Date  . Allergy   . Anemia   . Arthritis   . BPH (benign prostatic hyperplasia)   . CKD (chronic kidney disease), stage III   . Complication of anesthesia   . COPD (chronic obstructive pulmonary disease) ([place])    a. Uses Home O2 w/ exertion.  . Coronary artery disease    a. 11/2010 Cath: LM nl, LAD 53m (3.5x22 and 3.5x12 Resolute DES'), LCX nl, OM1/2/3 nl, RCA 20-30p, PDA nl.   . Diabetes mellitus without complication ([place])   . GERD (gastroesophageal reflux disease)   . H/O echocardiogram    a. per notes in 11/2010 - nl EF, basal inf wma.  .  Heart murmur   . History of kidney stones   . Hyperlipidemia   . Hypertensive heart disease   . Hyperthyroidism   . Neuropathy   . Neuropathy   . OSA (obstructive sleep apnea)    a. Uses CPAP.  Marland Kitchen PONV (postoperative nausea and vomiting)   . Vertigo    Past Surgical History  Past Surgical History:  Procedure Laterality Date  . 2 heart stents  12/18/10  . CARDIAC CATHETERIZATION    . CHOLECYSTECTOMY    . COLONOSCOPY    . REVERSE SHOULDER ARTHROPLASTY Right 10/08/2015  . REVERSE SHOULDER ARTHROPLASTY Right 10/08/2015   Procedure: REVERSE SHOULDER ARTHROPLASTY;  Surgeon: Netta Cedars, MD;  Location: [facility];  Service: Orthopedics;  Laterality: Right;  . right knee surgery Right 2008ish   "scraped it out"   Family History  Family History  Problem Relation Age of Onset  . Diabetes Mother   . Heart disease Father   . Diabetes Father   . Heart disease Sister   . Diabetes Sister    Social History  reports that he quit smoking about 32 years ago. His smoking use included cigarettes. He has a 22.50 pack-year smoking history. He  has never used smokeless tobacco. He reports current alcohol use. He reports that he does not use drugs. Allergies  Allergies  Allergen Reactions  . Aspirin Swelling    Angioedema-must take EC coated form only  . Ibuprofen Swelling    Angioedema   . Penicillins Hives    Did it involve swelling of the face/tongue/throat, SOB, or low BP? No Did it involve sudden or severe rash/hives, skin peeling, or any reaction on the inside of your mouth or nose? Yes Did you need to seek medical attention at a hospital or doctor's office? Yes When did it last happen?childhood  If all above answers are "NO", may proceed with cephalosporin use.   . Sulfa Antibiotics Hives   Home medications Prior to Admission medications   Medication Sig Start Date End Date Taking? Authorizing Provider  acetaminophen (TYLENOL) 500 MG tablet Take 500 mg by mouth every 8 (eight) hours  as needed for moderate pain.    Yes [provider]  albuterol (PROAIR HFA) 108 (90 Base) MCG/ACT inhaler 2 puffs every 4 hours as needed only  if your can't catch your breath 07/26/18  Yes Fenton Foy, NP  albuterol (PROVENTIL) (5 MG/ML) 0.5% nebulizer solution Take 0.5 mLs (2.5 mg total) by nebulization every 4 (four) hours as needed for wheezing or shortness of breath (((PLAN B))). 05/16/19  Yes Tanda Rockers, MD  aspirin EC 81 MG tablet Take 81 mg by mouth daily.   Yes [provider]  budesonide-formoterol (SYMBICORT) 160-4.5 MCG/ACT inhaler INHALE 2 PUFFS BY MOUTH  FIRST THING IN THE MORNING  AND THEN ANOTHER 2 PUFFS  ABOUT 12 HOURS LATER Patient taking differently: Inhale 2 puffs into the lungs every morning. INHALE 2 PUFFS BY MOUTH  FIRST THING IN THE MORNING 04/04/19  Yes Tanda Rockers, MD  cetirizine (ZYRTEC) 10 MG tablet Take 10 mg by mouth daily as needed for allergies.    Yes [provider]  colesevelam (WELCHOL) 625 MG tablet Take 625 mg by mouth 2 (two) times daily.   Yes [provider]  diltiazem (CARDIZEM SR) 90 MG 12 hr capsule Take 90 mg by mouth daily. 02/17/19  Yes [provider]  diltiazem (CARDIZEM) 60 MG tablet Take 60 mg by mouth 4 (four) times daily as needed (tachycardia).   Yes [provider]  diphenoxylate-atropine (LOMOTIL) 2.5-0.025 MG tablet Take 1 tablet by mouth 4 (four) times daily as needed for diarrhea or loose stools.  07/25/18  Yes [provider]  doxazosin (CARDURA) 8 MG tablet Take 8 mg by mouth at bedtime.   Yes [provider]  famotidine (PEPCID) 20 MG tablet Take 20 mg by mouth at bedtime.   Yes [provider]  gabapentin (NEURONTIN) 300 MG capsule Take 1 capsule by mouth 2 (two) times daily. 07/25/18  Yes [provider]  hydrochlorothiazide (HYDRODIURIL) 25 MG tablet Take 12.5 mg by mouth daily.   Yes [provider]  JANUVIA 100 MG tablet Take 100 mg  by mouth daily. 04/13/16  Yes [provider]  losartan (COZAAR) 100 MG tablet Take 100 mg by mouth daily.   Yes [provider]  magnesium oxide (MAG-OX) 400 MG tablet Take 400 mg by mouth daily.   Yes [provider]  meclizine (ANTIVERT) 12.5 MG tablet Take 25 mg by mouth daily.  02/05/16  Yes [provider]  Multiple Vitamin (MULTIVITAMIN WITH MINERALS) TABS tablet Take 1 tablet by mouth at bedtime.  Yes [provider]  nitroGLYCERIN (NITROSTAT) 0.4 MG SL tablet Place 1 tablet (0.4 mg total) under the tongue every 5 (five) minutes as needed for chest pain. 11/14/18  Yes Jerline Pain, MD  oxybutynin (DITROPAN) 5 MG tablet Take 5 mg by mouth daily as needed for bladder spasms.   Yes [provider]  oxyCODONE-acetaminophen (PERCOCET/ROXICET) 5-325 MG tablet Take 2 tablets by mouth every 4 (four) hours as needed for severe pain. 05/22/19  Yes Robyn Haber, MD  polyethylene glycol powder [facility]) 17 GM/SCOOP powder One capful in 8 oz water daily 05/22/19  Yes Robyn Haber, MD  potassium chloride SA (K-DUR,KLOR-CON) 20 MEQ tablet Take 1 tablet by mouth  daily 02/25/16  Yes Ria Bush, MD  Propylene Glycol (SYSTANE BALANCE) 0.6 % SOLN Place 2 drops into both eyes 2 (two) times daily.   Yes [provider]  albuterol (PROVENTIL) (2.5 MG/3ML) 0.083% nebulizer solution Take 3 mLs (2.5 mg total) by nebulization every 4 (four) hours as needed for wheezing or shortness of breath. 05/30/19   Tanda Rockers, MD  atorvastatin (LIPITOR) 40 MG tablet TAKE 1 TABLET BY MOUTH AT  BEDTIME please schedule an appt for further refills 1st attempt 05/29/19   Jerline Pain, MD  furosemide (LASIX) 20 MG tablet Take 2 tablets (40 mg total) by mouth daily. Please schedule an appt for further refills, 1st attempt 05/29/19   Jerline Pain, MD  [facility] VERIO test strip U UTD ONCE A DAY 07/21/18   [provider]  OXYGEN Inhale 2 L into the  lungs at bedtime.    [provider]  pantoprazole (PROTONIX) 40 MG tablet TAKE 1 TABLET BY MOUTH  DAILY 05/29/19   Tanda Rockers, MD  Respiratory Therapy Supplies (FLUTTER) DEVI Use as directed 02/04/15   Tanda Rockers, MD  SPIRIVA RESPIMAT 2.5 MCG/ACT AERS USE 2 SPRAYS(INHALATIONS)   BY MOUTH EVERY MORNING Patient not taking: No sig reported 04/04/19   Tanda Rockers, MD  UNABLE TO FIND Med Name: CPAP with 2lpm o2 with sleep    [provider]   Liver Function Tests No results for input(s): AST, ALT, ALKPHOS, BILITOT, PROT, ALBUMIN in the last 168 hours. No results for input(s): LIPASE, AMYLASE in the last 168 hours. CBC Recent Labs  Lab 05/31/19 0357 06/01/19 0553 06/02/19 0426  WBC 7.6 7.8 8.3  HGB 12.4* 12.4* 12.4*  HCT 39.4 39.4 39.3  MCV 91.4 90.8 91.0  PLT 164 159 086   Basic Metabolic Panel Recent Labs  Lab 05/31/19 0357 06/01/19 0553 06/02/19 0426 06/03/19 0506 06/03/19 0735 06/03/19 1243 06/03/19 1448 06/03/19 1841  NA 126* 128* 128* 129*  134* 128* 126* 126*  --   K 5.4* 5.5* 5.4* 6.0*  3.3* 5.9* 7.0* 6.1* 6.0*  CL 89* 89* 89* 88*  111 89* 89* 88*  --   CO2 27 30 29 30   16* 30 28 31   --   GLUCOSE 123* 127* 125* 142*  77 139* 177* 196*  --   BUN 44* 43* 46* 53*  5* 54* 57* 54*  --   CREATININE 1.44* 1.45* 1.45* 1.59*  0.64 1.62* 1.63* 1.61*  --   CALCIUM 9.3 9.2 9.2 9.3  7.7* 9.1 9.0 9.2  --    Iron/TIBC/Ferritin/ %Sat No results found for: IRON, TIBC, FERRITIN, IRONPCTSAT  Vitals:   06/03/19 1700 06/03/19 1900 06/03/19 2000 06/03/19 2005  BP: (!) 152/43 (!) 152/35 (!) 154/54   Pulse: 89  75 83   Resp: (!) 23 (!) 24 20   Temp:    98 F (36.7 C)  TempSrc:    Oral  SpO2: (!) 87% 91% 91%   Weight:      Height:        Exam Gen elderly obese WM , confused, mumbling , resp effort is not the greatest, has some mild intermiittent myoclonic jerking or the UE's  No rash, cyanosis or gangrene Sclera anicteric, throat clear  No  jvd or bruits Chest bilat rales, 1/3 up post RRR no MRG Abd soft ntnd no mass or ascites +bs, markedly obese GU normal male  MS no joint effusions or deformity Ext severe bilat LE edema from the feet to the hips 3-4+ Neuro is groggy, falls asleep off and on, mumbling hard to understand, follows simple commands    Home meds:  - furosemide 40 qd/ diltiazem sr 90/ doxazosin 8 hs/ hctz 12.5/ losartan 100  - home O2 2L/ spiriva respimat/ budesonide-formoterol bid  - aspirin 81/ colesevelam 625 bid/ sl ntg prn  - oxycodone -aceta qid prn/ gabapentin 300 bid  - linagliptin 5 qd  - famotidine 20 hs  - prn's/ vitamins/ supplements    UA 11/14 cloudy >50 wbc, >50 rbc, 30 protein   Una <10, UCreat 210    CXR 11/11 - vasc congestion, ? New basilar densities    CXR 11/14 - worsening edema IS   Assessment/ Plan: 1. AKI on CKD 3b - baseline creat 1.3- 1.7.  Creat here 1.6 today.  Cardiorenal situation w/ severe edema and decomp diast CHF and pulm edema worsening on today's CXR. Exam w/ rales and severe diffuse edema.  Has been getting IV lasix but will need a higher dose, wt's are up since admission.  Not sure that patient has the strength to tolerate a course of diuresis. He looks bad tonight w/ confusion /jerking, marginal resp effort.  High risk of decompensation. I explained to son. IV lasix ordered. DC gabapentin. Pt is DNR. If gets worse would consider comfort care (I.e., MSO4 gtt).  2. AdenoCa of unknown primary - w/ vertebral mets and diffuse abdominal LAN 3. HTN - bp's are good 4. Chron resp failure / COPD - on nasal O2 5. DM2 - on oral agents      Kelly Splinter  MD 06/03/2019, 8:37 PM

## 2019-06-04 ENCOUNTER — Other Ambulatory Visit: Payer: Self-pay

## 2019-06-04 DIAGNOSIS — B309 Viral conjunctivitis, unspecified: Secondary | ICD-10-CM

## 2019-06-04 DIAGNOSIS — G9341 Metabolic encephalopathy: Secondary | ICD-10-CM

## 2019-06-04 DIAGNOSIS — R0602 Shortness of breath: Secondary | ICD-10-CM

## 2019-06-04 DIAGNOSIS — J9601 Acute respiratory failure with hypoxia: Secondary | ICD-10-CM

## 2019-06-04 DIAGNOSIS — C7951 Secondary malignant neoplasm of bone: Secondary | ICD-10-CM | POA: Diagnosis not present

## 2019-06-04 LAB — CBC
HCT: 36.7 % — ABNORMAL LOW (ref 39.0–52.0)
Hemoglobin: 11.8 g/dL — ABNORMAL LOW (ref 13.0–17.0)
MCH: 29.1 pg (ref 26.0–34.0)
MCHC: 32.2 g/dL (ref 30.0–36.0)
MCV: 90.4 fL (ref 80.0–100.0)
Platelets: 149 10*3/uL — ABNORMAL LOW (ref 150–400)
RBC: 4.06 MIL/uL — ABNORMAL LOW (ref 4.22–5.81)
RDW: 16.4 % — ABNORMAL HIGH (ref 11.5–15.5)
WBC: 6.6 10*3/uL (ref 4.0–10.5)
nRBC: 0 % (ref 0.0–0.2)

## 2019-06-04 LAB — BASIC METABOLIC PANEL
Anion gap: 9 (ref 5–15)
BUN: 60 mg/dL — ABNORMAL HIGH (ref 8–23)
CO2: 31 mmol/L (ref 22–32)
Calcium: 9.4 mg/dL (ref 8.9–10.3)
Chloride: 88 mmol/L — ABNORMAL LOW (ref 98–111)
Creatinine, Ser: 1.57 mg/dL — ABNORMAL HIGH (ref 0.61–1.24)
GFR calc Af Amer: 50 mL/min — ABNORMAL LOW (ref >60)
GFR calc non Af Amer: 43 mL/min — ABNORMAL LOW (ref >60)
Glucose, Bld: 118 mg/dL — ABNORMAL HIGH (ref 70–99)
Potassium: 6.1 mmol/L — ABNORMAL HIGH (ref 3.5–5.1)
Sodium: 128 mmol/L — ABNORMAL LOW (ref 135–145)

## 2019-06-04 LAB — GLUCOSE, CAPILLARY
Glucose-Capillary: 101 mg/dL — ABNORMAL HIGH (ref 70–99)
Glucose-Capillary: 133 mg/dL — ABNORMAL HIGH (ref 70–99)
Glucose-Capillary: 154 mg/dL — ABNORMAL HIGH (ref 70–99)
Glucose-Capillary: 130 mg/dL — ABNORMAL HIGH (ref 70–99)

## 2019-06-04 LAB — POTASSIUM
Potassium: 6 mmol/L — ABNORMAL HIGH (ref 3.5–5.1)
Potassium: 6 mmol/L — ABNORMAL HIGH (ref 3.5–5.1)
Potassium: 6.2 mmol/L — ABNORMAL HIGH (ref 3.5–5.1)
Potassium: 5.7 mmol/L — ABNORMAL HIGH (ref 3.5–5.1)
Potassium: 5.6 mmol/L — ABNORMAL HIGH (ref 3.5–5.1)

## 2019-06-04 MED ORDER — LORAZEPAM 2 MG/ML IJ SOLN
1.0000 mg | Freq: Once | INTRAMUSCULAR | Status: AC
  Administered 2019-06-04: 1 mg via INTRAVENOUS
  Filled 2019-06-04: qty 1

## 2019-06-04 MED ORDER — HYDROMORPHONE HCL 1 MG/ML IJ SOLN
0.2000 mg | Freq: Four times a day (QID) | INTRAMUSCULAR | Status: DC | PRN
  Administered 2019-06-04 – 2019-06-05 (×3): 0.5 mg via INTRAVENOUS
  Filled 2019-06-04 (×3): qty 1

## 2019-06-04 MED ORDER — FUROSEMIDE 10 MG/ML IJ SOLN
120.0000 mg | Freq: Three times a day (TID) | INTRAVENOUS | Status: DC
  Administered 2019-06-04 – 2019-06-05 (×3): 120 mg via INTRAVENOUS
  Filled 2019-06-04: qty 10
  Filled 2019-06-04 (×3): qty 12

## 2019-06-04 NOTE — Progress Notes (Signed)
Pt was awake and lying in bed when I arrived. His SO was bedside; I spoke briefly to his son in the hallway. Pt was having his meal and said he did not need anything at this time. Please page if additional assistance is needed. [place], MDiv   06/04/19 1500  Clinical Encounter Type  Visited With Patient and family together

## 2019-06-04 NOTE — Progress Notes (Signed)
PROGRESS NOTE    Shane Watson    Code Status: DNR  VVO:[id number] DOB: July 23, 1945 DOA: 06/05/2019  PCP: Shirline Frees, MD    Hospital Summary   73 year old male with history of hypertension, chronic back pain/neuropathy, BPH, CKD stage III, drug-eluting stent 12/18/2010, COPD, CAD, diabetes mellitus, GERD, obstructive sleep apnea on CPAP evaluated by PCPfew weeks back forback pain/abdominal pain, underwent outpatient work upwithCT scan of the abdomen pelvis on 10/12showinglower mediastinal and abdominal/pelvic lymphadenopathy related to lymphoma or metastatic disease as well astiny pulmonary nodules in the right lung base which were stable since the prior study->PET scan performed on 05/04/2019. PET scan results showed hypermetabolic mediastinal prevascular nodes and hypermetabolic retroperitoneal and mesenteric nodes consistent with lymphoma versus granulomatous disease (sarcoidosis),lytic lesion in the T7 vertebral body with moderate metabolic activity concerning for skeletal metastasis versus sarcoid lesion, small pulmonary nodules do not have associated metabolic activity-->patient underwent CT-guided retroperitoneal lymph node biopsy by IR on 10/22and postprocedure complained of pain radiating to his groin-NSAIDs were recommended. Pathology results indicate undifferentiated adenocarcinoma-->He was referred for oncology evaluation and was scheduled to seeDr Irene Limbo on 11/10.  Patient had worsening back pain and presented to Urgent care on11/2and prescribed 5mg  Oxycodone-1 tab was not enough and 2 tabs were making him too drowsy. Patientwas alsoseen by his primary pulmonologistrecently as outpatient for chest/abdominal pain complaints- felt to havesubdiaphragmatic pain/bloating and dietary changes were advised. Last colonoscopy was 3 years back.  [facility] complaints of persistentbackpainradiating to left anterior abdomen.He also reports  worsening lower extremity edema.   He has since been evaluated by palliative care for goals of care pain management and has underwent 5/10 radiation treatments.  11/14: Plan was for discharge home with hospice today however patient looked more toxic and on lab work patient had hyperkalemia of 6.0.  Did not respond with Lasix.  Albuterol was ordered but was not given.  Repeat lab work showed hyperkalemia of 7.0.  EKG with peaked T waves in anterior leads and normal QT.  Given calcium gluconate, insulin 10 units with dextrose.  Nephrology consulted.  Patient transferred to stepdown unit  11/15: Difficult event increased work of breathing with desaturation despite 10 L high flow nasal cannula and low urine output despite high doses of Lasix.  Patient placed on BiPAP given Ativan and Dr. Rowe Pavy made aware patient's declining status.  Goals of care discussion with son family decided to pursue BiPAP for the next 24 hours however unfortunately the patient only tolerated the mask for a couple hours and was placed back on high flow nasal cannula.  A & P   Principal Problem:   ARF (acute renal failure) (HCC) Active Problems:   CKD (chronic kidney disease) stage 4, GFR 15-29 ml/min (HCC)   Coronary artery disease due to lipid rich plaque   COPD (chronic obstructive pulmonary disease) (HCC)   Back pain   Diastolic CHF (HCC)   Adenocarcinoma, 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HCC)   Bone metastases ([place])   Counseling regarding advance care planning and goals of care   Acute respiratory failure secondary to diastolic heart failure exacerbation, COPD exacerbation, likely with OSA and OHV syndrome clinically deteriorating as below, not tolerating BiPAP -Continue on high flow  Acute metabolic encephalopathy multifactorial: Suspected hypercapnia, medication induced, heart failure not tolerating BiPAP, DNR.  Gabapentin discontinued, Dilaudid frequency decreased.  Received Ativan 1 mg x 1 this morning -unfortunately patient has a  poor prognosis given his issues as described below which was medicated with patient's family.  Family wishes to pursue continued observation over the next 24 hours.  Will readdress comfort measures in a.m. -Hold off on ABG at this time to prevent further discomfort results are unlikely to change management as he is not tolerating BiPAP and is DNR/DNI  Hyperkalemia significant event as above on 11/14.  Nephrology started patient on high-dose Lasix and Lokelma 3 times daily with minimal improvement in K overnight. -Patient has high risk of clinical deterioration and continues to require step down unit.  Hyponatremia in setting of heart failure.  Minimal improvement.  Currently 128 -Nephro on board.   Diastolic heart failure exacerbation EF 50 to 55%.  Volume status continues to deteriorate this patient has minimal urine output. -Continue HF protocol  AKI on CKD 3 secondary to heart failure.  And stable at 1.57 -Hold HCTZ and losartan -Hold gabapentin -Nephro consulted  Intractable back pain secondary to pleuritic bony lesions metastatic adenocarcinoma with unknown primary Plan was initially to go home with [facility] 11/14 with intention to complete palliative radiation treatments outpatient however given acute changes patient remains inpatient. 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  AFP, CEA, hCG, PSA normal. Per Dr. Irene Limbo, oncology, this is incurable and possible options are palliative.  Started XRT 11/9, received 5/10 patient treatment so far -Plan as above -will rediscuss comfort measures in a.m. as above  Abdominal pain secondary to urinary retention from post obstruction Coud catheter placed 11/13 initially with significant urine output and improvement in abdominal pain almost immediately.  Now with minimal urine output -Continue catheter for comfort  COPD  exacerbation likely secondary to heart failure exacerbation  -Continue current management  Type 2 diabetes stable -Continue sliding scale, Tradjenta  History of palpitations started on Cardizem 2019.   -Discontinue Cardizem in setting of worsening heart failure -Lopressor 5 mg IV PRN for HR >110, given today x1  CAD status post DES 2012  -Continue aspirin, statin  Hyperlipidemia -Continue Lipitor  Morbid obesity BMI 23 -Was counseled on diet weight control  Bilateral viral conjunctivitis -Eye care  GERD stable -Continue current management   DVT prophylaxis: Lovenox Diet: Heart healthy Family Communication: Family updated at bedside Disposition Plan: Patient continues to require stepdown unit and has high risk of clinical deterioration as described above.  Poor prognosis going forward and unfortunately not much else in terms of treatment options.  We will readdress comfort measures in a.m.  Consultants  Palliative Oncology Radiation oncology Nephrology  Procedures  XRT x5/10      Subjective   Significant events this morning as patient had an episode of increased work of breathing and hypoxia requiring high flow followed by BiPAP.  I had a long discussion with the patient's son today as described in previous note this morning.  Patient is becoming more encephalopathic and is unable to provide history at this time.  I was later notified the patient was taken off BiPAP as he was pulling it off his face and had a sudden increase in heart rate to 150s requiring metoprolol.  He had improvement in his heart rate and stabilized.  Placed him on high flow increased to 15 L  I returned later in the afternoon to follow-up on the patient and family to see how they are doing.  Patient is more encephalopathic this afternoon as he has been off BiPAP.  Significant other and son at bedside.  All her questions were  answered.  I told him to reach out if they have any further questions  this evening.   Objective   Vitals:   06/04/19 1154 06/04/19 1200 06/04/19 1332 06/04/19 1500  BP: (!) 150/59 (!) 124/53  (!) 127/58  Pulse: (!) 105 (!) 103 98 (!) 103  Resp: (!) 22 19 20  (!) 23  Temp:  98.7 F (37.1 C)    TempSrc:      SpO2: 93% 92% 92% 93%  Weight:      Height:        Intake/Output Summary (Last 24 hours) at 06/04/2019 1602 Last data filed at 06/04/2019 1548 Gross per 24 hour  Intake 612 ml  Output -  Net 612 ml   Filed Weights   06/03/19 0500 06/03/19 1630 06/04/19 0500  Weight: (!) 159.7 kg (!) 161.2 kg (!) 165.8 kg    Examination:  Physical Exam Vitals signs and nursing note reviewed. Exam conducted with a chaperone present.  Constitutional:      General: He is in acute distress.     Appearance: He is toxic-appearing and diaphoretic.     Comments: Abdominal breathing  HENT:     Mouth/Throat:     Mouth: Mucous membranes are dry.     Comments: Mouth breathing Eyes:     General:        Right eye: Discharge present.        Left eye: Discharge present. Neck:     Musculoskeletal: No neck rigidity.  Cardiovascular:     Rate and Rhythm: Regular rhythm. Tachycardia present.  Pulmonary:     Effort: Respiratory distress present.     Breath sounds: No wheezing.     Comments: Audible gurgling Abdominal:     Tenderness: There is no abdominal tenderness. There is no guarding.  Musculoskeletal:     Right lower leg: Edema present.     Left lower leg: Edema present.  Neurological:     Mental Status: He is disoriented.     Data Reviewed: I have personally reviewed following labs and imaging studies  CBC: Recent Labs  Lab 05/30/19 0339 05/31/19 0357 06/01/19 0553 06/02/19 0426 06/04/19 0556  WBC 7.2 7.6 7.8 8.3 6.6  HGB 11.5* 12.4* 12.4* 12.4* 11.8*  HCT 36.3* 39.4 39.4 39.3 36.7*  MCV 91.2 91.4 90.8 91.0 90.4  PLT 161 164 159 179 096*   Basic Metabolic Panel: Recent Labs  Lab 06/01/19 0553 06/02/19 0426 06/03/19 0506 06/03/19  0735 06/03/19 1243 06/03/19 1448  06/03/19 2202 06/04/19 0223 06/04/19 0556 06/04/19 0958 06/04/19 1414  NA 128* 128* 129*  134* 128* 126* 126*  --   --   --  128*  --   --   K 5.5* 5.4* 6.0*  3.3* 5.9* 7.0* 6.1*   < > 6.7* 6.0* 6.1* 6.0* 6.2*  CL 89* 89* 88*  111 89* 89* 88*  --   --   --  88*  --   --   CO2 30 29 30   16* 30 28 31   --   --   --  31  --   --   GLUCOSE 127* 125* 142*  77 139* 177* 196*  --   --   --  118*  --   --   BUN 43* 46* 53*  5* 54* 57* 54*  --   --   --  60*  --   --   CREATININE 1.45* 1.45* 1.59*  0.64 1.62* 1.63* 1.61*  --   --   --  1.57*  --   --   CALCIUM 9.2 9.2 9.3  7.7* 9.1 9.0 9.2  --   --   --  9.4  --   --   MG 2.7* 2.5*  --   --   --   --   --   --   --   --   --   --    < > = values in this interval not displayed.   GFR: Estimated Creatinine Clearance: 65.1 mL/min (A) (by C-G formula based on SCr of 1.57 mg/dL (H)). Liver Function Tests: No results for input(s): AST, ALT, ALKPHOS, BILITOT, PROT, ALBUMIN in the last 168 hours. No results for input(s): LIPASE, AMYLASE in the last 168 hours. No results for input(s): AMMONIA in the last 168 hours. Coagulation Profile: No results for input(s): INR, PROTIME in the last 168 hours. Cardiac Enzymes: No results for input(s): CKTOTAL, CKMB, CKMBINDEX, TROPONINI in the last 168 hours. BNP (last 3 results) No results for input(s): PROBNP in the last 8760 hours. HbA1C: No results for input(s): HGBA1C in the last 72 hours. CBG: Recent Labs  Lab 06/03/19 1455 06/03/19 1703 06/03/19 2122 06/04/19 0743 06/04/19 1257  GLUCAP 171* 156* 118* 101* 133*   Lipid Profile: No results for input(s): CHOL, HDL, LDLCALC, TRIG, CHOLHDL, LDLDIRECT in the last 72 hours. Thyroid Function Tests: No results for input(s): TSH, T4TOTAL, FREET4, T3FREE, THYROIDAB in the last 72 hours. Anemia Panel: No results for input(s): VITAMINB12, FOLATE, FERRITIN, TIBC, IRON, RETICCTPCT in the last 72 hours. Sepsis Labs:  No results for input(s): PROCALCITON, LATICACIDVEN in the last 168 hours.  No results found for this or any previous visit (from the past 240 hour(s)).       Radiology Studies: US Renal  Result Date: 06/03/2019 CLINICAL DATA:  Acute kidney injury. EXAM: RENAL / URINARY TRACT ULTRASOUND COMPLETE COMPARISON:  Abdomen and pelvis CT dated 06/10/2019 FINDINGS: Right Kidney: Renal measurements: Difficult to obtain due to poor visualization due to the patient's body habitus, measuring approximately 13.3 x 6.4 x 5.6 cm = volume: Approximately 249 mL. Difficult to determine with an appearance more echogenic superiorly and inferiorly and poorly defined. This is better visualized in the midportion of the kidney, with normal echogenicity. No hydronephrosis. Left Kidney: Renal measurements: Difficult to obtain due to poor visualization due to the patient's body habitus, measuring approximately 12.0 x 6.2 x 5.6 cm. = volume: Approximately 214 mL. Echogenicity difficult to determine due to the patient's body habitus, appearing mildly echogenic in some areas and better visualized and normal in echotexture in others. 6.3 cm grossly simple cyst. Otherwise, no mass or hydronephrosis visualized. Bladder: Not visualized with a Foley catheter in place. Other: None. IMPRESSION: 1. Very limited examination due to the patient's body habitus and a very poor visualization of the kidneys. 2. No gross abnormality seen. Electronically Signed   By: Claudie Revering M.D.   On: 06/03/2019 17:35   Dg Chest Port 1 View  Result Date: 06/03/2019 CLINICAL DATA:  Cough EXAM: PORTABLE CHEST 1 VIEW COMPARISON:  05/31/2019 FINDINGS: No significant change in AP portable examination with diffuse interstitial pulmonary opacity and probable small layering pleural effusions. No new airspace opacity. Cardiomegaly. IMPRESSION: 1. No significant change in AP portable examination with diffuse interstitial pulmonary opacity and probable small layering  pleural effusions. 2.  No new airspace opacity. Cardiomegaly. Electronically Signed   By: Eddie Candle M.D.   On: 06/03/2019 14:32        Scheduled  Meds: . albuterol  2.5 mg Nebulization Once  . aspirin EC  81 mg Oral Daily  . atorvastatin  40 mg Oral Daily  . Chlorhexidine Gluconate Cloth  6 each Topical Daily  . colesevelam  625 mg Oral BID WC  . doxazosin  8 mg Oral QHS  . enoxaparin (LOVENOX) injection  80 mg Subcutaneous Q24H  . famotidine  20 mg Oral QHS  . insulin aspart  0-15 Units Subcutaneous TID WC  . insulin aspart  0-5 Units Subcutaneous QHS  . ipratropium-albuterol  3 mL Nebulization Q6H  . lidocaine  3 patch Transdermal Q24H  . linagliptin  5 mg Oral Daily  . loratadine  10 mg Oral Daily  . magnesium oxide  400 mg Oral Daily  . meclizine  25 mg Oral Daily  . mouth rinse  15 mL Mouth Rinse BID  . mometasone-formoterol  2 puff Inhalation BID  . pantoprazole  40 mg Oral Daily  . polyethylene glycol  17 g Oral Daily  . senna  1 tablet Oral Daily  . sodium zirconium cyclosilicate  10 g Oral TID   Continuous Infusions: . furosemide       LOS: 10 days    Time spent: 75 minutes with over 50% of the time coordinating the patient's care    Harold Hedge, DO [facility] Pager [phone]  If 7PM-7AM, please contact night-coverage [email or web address] Password [email or web address] 06/04/2019, 4:02 PM

## 2019-06-04 NOTE — Significant Event (Signed)
Notified this morning regarding patients increased work of breathing and desaturations requiring 10 L HF [place] and low urine output.  At bedside, patient with increased work of breathing. Son at bedside.   Son and I went to the family room and had a Goals of Care discussion regarding patient's recent acute change in clinical status and broached the idea of comfort measures. He was understandably tearful but understanding and appreciative of the conversation.   -Patient placed on Bipap 12/5 for increased work of breathing, likely heart failure induced -Ativan 1 mg IV x 1 for anxiety -Discussed with Dr. Rowe Pavy, palliative. Likely to consider comfort measures today  Full note to follow  Marva Panda, DO

## 2019-06-04 NOTE — Progress Notes (Signed)
[facility] Progress Note  Subjective: 700 cc uop yest, 200 so far today.   Vitals:   06/04/19 1034 06/04/19 1154 06/04/19 1200 06/04/19 1332  BP:  (!) 150/59 (!) 124/53   Pulse: 99 (!) 105 (!) 103 98  Resp: 19 (!) 22 19 20   Temp:   98.7 F (37.1 C)   TempSrc:      SpO2: 93% 93% 92% 92%  Weight:      Height:        Inpatient medications: . albuterol  2.5 mg Nebulization Once  . aspirin EC  81 mg Oral Daily  . atorvastatin  40 mg Oral Daily  . Chlorhexidine Gluconate Cloth  6 each Topical Daily  . colesevelam  625 mg Oral BID WC  . doxazosin  8 mg Oral QHS  . enoxaparin (LOVENOX) injection  80 mg Subcutaneous Q24H  . famotidine  20 mg Oral QHS  . insulin aspart  0-15 Units Subcutaneous TID WC  . insulin aspart  0-5 Units Subcutaneous QHS  . ipratropium-albuterol  3 mL Nebulization Q6H  . lidocaine  3 patch Transdermal Q24H  . linagliptin  5 mg Oral Daily  . loratadine  10 mg Oral Daily  . magnesium oxide  400 mg Oral Daily  . meclizine  25 mg Oral Daily  . mouth rinse  15 mL Mouth Rinse BID  . mometasone-formoterol  2 puff Inhalation BID  . pantoprazole  40 mg Oral Daily  . polyethylene glycol  17 g Oral Daily  . senna  1 tablet Oral Daily  . sodium zirconium cyclosilicate  10 g Oral TID   . furosemide Stopped (06/04/19 1058)   acetaminophen **OR** acetaminophen, albuterol, diphenoxylate-atropine, hydrALAZINE, HYDROmorphone (DILAUDID) injection, metoprolol tartrate, naLOXone (NARCAN)  injection, ondansetron (ZOFRAN) IV, opium-belladonna, oxyCODONE, polyvinyl alcohol    Exam: Gen elderly obese WM , confused, mumbling , resp effort is not the greatest, has some mild intermiittent myoclonic jerking or the UE's  No rash, cyanosis or gangrene Sclera anicteric, throat clear  No jvd or bruits Chest bilat rales, 1/3 up post RRR no MRG Abd soft ntnd no mass or ascites +bs, markedly obese GU normal male  MS no joint effusions or deformity Ext severe bilat  LE edema from the feet to the hips 3-4+ Neuro is groggy, falls asleep off and on, mumbling hard to understand, follows simple commands    Home meds:  - furosemide 40 qd/ diltiazem sr 90/ doxazosin 8 hs/ hctz 12.5/ losartan 100  - home O2 2L/ spiriva respimat/ budesonide-formoterol bid  - aspirin 81/ colesevelam 625 bid/ sl ntg prn  - oxycodone -aceta qid prn/ gabapentin 300 bid  - linagliptin 5 qd  - famotidine 20 hs  - prn's/ vitamins/ supplements    UA 11/14 cloudy >50 wbc, >50 rbc, 30 protein   Una <10, UCreat 210    CXR 11/11 - vasc congestion, ? New basilar densities    CXR 11/14 - worsening edema IS   Assessment/ Plan: 1. AKI on CKD 3b - baseline creat 1.3- 1.7.  Creat here 1.6 today.  Cardiorenal issue w/ severe peripheral edema and pulm edema. Acute decline last 49 hrs, raised IV lasix yest to 120mg  but UOP not great overnight and today only 200 cc so far. Put on BIPAP this am for resp difficutly, HR's high in 130's. No new suggestions, poor outlook,  Pall care is seeing and working w/ family/ patient. Pt is DNR.  2. Hyperkalemia - renal diet, lokelma  tid  3. [place] of unknown primary - w/ vertebral mets and diffuse abdominal LAN 4. HTN - bp's okay  5. Chron resp failure / COPD - on nasal O2 6. DM2 - on oral agents     Rob Tarsha Blando 06/04/2019, 2:07 PM  Iron/TIBC/Ferritin/ %Sat No results found for: IRON, TIBC, FERRITIN, IRONPCTSAT Recent Labs  Lab 06/04/19 0556 06/04/19 0958  NA 128*  --   K 6.1* 6.0*  CL 88*  --   CO2 31  --   GLUCOSE 118*  --   BUN 60*  --   CREATININE 1.57*  --   CALCIUM 9.4  --    No results for input(s): AST, ALT, ALKPHOS, BILITOT, PROT in the last 168 hours. Recent Labs  Lab 06/04/19 0556  WBC 6.6  HGB 11.8*  HCT 36.7*  PLT 149*

## 2019-06-04 NOTE — Progress Notes (Signed)
Daily Progress Note   Patient Name: Shane Watson       Date: 06/04/2019 DOB: Nov 29, 1945  Age: 73 y.o. MRN#: [id number] Attending Physician: Harold Hedge, MD Primary Care Physician: Shirline Frees, MD Admit Date: 06/16/2019  Reason for Consultation/Follow-up: Establishing goals of care and Pain control  Subjective: Events over the course of the past 24 hours noted.  Patient's discharge was held due to the patient appearing more weak as well as blood work reflecting hyperkalemia.  Patient transferred to stepdown unit.  Patient seen and evaluated by renal services. Son currently at bedside.  Patient has been started on BiPAP.  See below.    Length of Stay: 10  Current Medications: Scheduled Meds:  . albuterol  2.5 mg Nebulization Once  . aspirin EC  81 mg Oral Daily  . atorvastatin  40 mg Oral Daily  . Chlorhexidine Gluconate Cloth  6 each Topical Daily  . colesevelam  625 mg Oral BID WC  . doxazosin  8 mg Oral QHS  . enoxaparin (LOVENOX) injection  80 mg Subcutaneous Q24H  . famotidine  20 mg Oral QHS  . insulin aspart  0-15 Units Subcutaneous TID WC  . insulin aspart  0-5 Units Subcutaneous QHS  . ipratropium-albuterol  3 mL Nebulization Q6H  . lidocaine  3 patch Transdermal Q24H  . linagliptin  5 mg Oral Daily  . loratadine  10 mg Oral Daily  . magnesium oxide  400 mg Oral Daily  . meclizine  25 mg Oral Daily  . mouth rinse  15 mL Mouth Rinse BID  . mometasone-formoterol  2 puff Inhalation BID  . pantoprazole  40 mg Oral Daily  . polyethylene glycol  17 g Oral Daily  . senna  1 tablet Oral Daily  . sodium zirconium cyclosilicate  10 g Oral TID    Continuous Infusions: . furosemide Stopped (06/04/19 1058)    PRN Meds: acetaminophen **OR** acetaminophen, albuterol,  diphenoxylate-atropine, hydrALAZINE, HYDROmorphone (DILAUDID) injection, metoprolol tartrate, naLOXone (NARCAN)  injection, ondansetron (ZOFRAN) IV, opium-belladonna, oxyCODONE, polyvinyl alcohol  Physical Exam         General: Currently on BiPAP. HEENT: No bruits, no goiter, no JVD Heart: Regular rate and rhythm. No murmur appreciated. Lungs: Diminished breath sounds Abdomen: Soft, nontender, nondistended, positive bowel sounds.  Obese. Skin: Warm and dry  Has a lot of LE edema Awake and reasonably alert on BIPAP, able to use bedside suction.   Vital Signs: BP (!) 150/59   Pulse (!) 105   Temp 98.7 F (37.1 C)   Resp (!) 22   Ht 5\' 11"  (1.803 m)   Wt (!) 165.8 kg   SpO2 93%   BMI 50.98 kg/m  SpO2: SpO2: 93 % O2 Device: O2 Device: High Flow Nasal Cannula O2 Flow Rate: O2 Flow Rate (L/min): 10 L/min  Intake/output summary:   Intake/Output Summary (Last 24 hours) at 06/04/2019 1257 Last data filed at 06/04/2019 0600 Gross per 24 hour  Intake 550 ml  Output 200 ml  Net 350 ml   LBM: Last BM Date: 06/01/19 Baseline Weight: Weight: (!) 157.9 kg Most recent weight: Weight: (!) 165.8 kg       Palliative Assessment/Data:      Patient Active Problem List   Diagnosis Date Noted  . Adenocarcinoma, 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[place])   . Bone metastases ([place])   . Counseling regarding advance care planning and goals of care   . ARF (acute renal failure) ([place]) 05/25/2019  . Back pain 05/25/2019  . Diastolic CHF ([place]) 81/82/9937  . IBS (irritable bowel syndrome) 05/15/2019  . Acute bronchitis with COPD ([place]) 07/27/2018  . Abnormal CT scan of lung 03/30/2017  . Morbid obesity ([place]) 10/08/2015  . Lower extremity edema 10/08/2015  . S/P shoulder replacement 10/08/2015  . Fracture, humerus, proximal 10/07/2015  . Coronary artery disease   . Hypertensive heart disease   . CKD (chronic kidney disease), stage III   . Hyperlipidemia   . COPD (chronic obstructive pulmonary disease) ([place])   .  Morbid obesity due to excess calories ([place]) 02/25/2015  . Dyspnea 02/04/2015  . CKD (chronic kidney disease) 10/09/2014  . Coronary artery disease due to lipid rich plaque 10/09/2014  . Chronic respiratory failure with hypercapnia ([place]) 05/08/2014  . Cellulitis of left lower extremity 02/20/2014  . Leukocytosis, unspecified 02/20/2014  . COPD exacerbation ([place]) 02/20/2014  . Dyslipidemia 02/20/2014  . CKD (chronic kidney disease) stage 4, GFR 15-29 ml/min (HCC) 02/20/2014  . Multiple pulmonary nodules 11/01/2013  . Asthmatic bronchitis , chronic (HCC)   FEV1/FVC nl p bronchodilators  05/03/2013  . Obstructive sleep apnea 05/02/2013    Palliative Care Assessment & Plan   Patient Profile: 73 y.o. male  with past medical history of hypertension, hyperlipidemia, diabetes with peripheral neuropathy, CKD stage III, CAD, anemia, BPH admitted on 05/30/2019 with worsening back pain.  Earlier this month, patient had CT of abdomen pelvis that showed lower mediastinal and abdominal/pelvic adenopathy which could be related to lymphoma or metastatic disease.  There are also multiple lucent bone lesions concerning for metastatic disease.  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  Staining profile is nonspecific.  He is currently admitted for worsened back pain with plan to start radiation therapy on Monday.  Palliative consulted for pain management and goals of care.  Assessment: Patient Active Problem List   Diagnosis Date Noted  . 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 Bone metastases ([place])   . Counseling regarding advance care planning and goals of care   . ARF (acute renal failure) ([place]) 05/25/2019  . Back pain 05/25/2019  . Diastolic CHF ([place]) 16/96/7893  . IBS (irritable bowel syndrome) 05/15/2019  . Acute bronchitis with COPD ([place]) 07/27/2018  . Abnormal CT scan of lung 03/30/2017   .  Morbid obesity ([place]) 10/08/2015  . Lower extremity edema 10/08/2015  . S/P shoulder replacement 10/08/2015  . Fracture, humerus, proximal 10/07/2015  . Coronary artery disease   . Hypertensive heart disease   . CKD (chronic kidney disease), stage III   . Hyperlipidemia   . COPD (chronic obstructive pulmonary disease) ([place])   . Morbid obesity due to excess calories ([place]) 02/25/2015  . Dyspnea 02/04/2015  . CKD (chronic kidney disease) 10/09/2014  . Coronary artery disease due to lipid rich plaque 10/09/2014  . Chronic respiratory failure with hypercapnia ([place]) 05/08/2014  . Cellulitis of left lower extremity 02/20/2014  . Leukocytosis, unspecified 02/20/2014  . COPD exacerbation ([place]) 02/20/2014  . Dyslipidemia 02/20/2014  . CKD (chronic kidney disease) stage 4, GFR 15-29 ml/min (HCC) 02/20/2014  . Multiple pulmonary nodules 11/01/2013  . Asthmatic bronchitis , chronic (HCC)   FEV1/FVC nl p bronchodilators  05/03/2013  . Obstructive sleep apnea 05/02/2013     Discussions/Recommendations/Plan:  Family meeting with patient and son at bedside today: I reviewed with the patient's son about current situation.  Discussed that the patient has recent diagnosis of adenocarcinoma of unknown primary.  Patient with known diffuse abdominal/pelvic lymph nodes and a lytic lesions.  Patient was on palliative radiation.  We discussed about scope of palliative radiation.  Discussed with him in detail about high suspicion for cardiorenal syndrome and what that entails.  Current medical management explained in detail.  All of his questions addressed to the best of my ability.  I mode of care that focuses exclusively on comfort, transition to hospice services possibly in a residential hospice facility setting discussed and explained in detail.  Patient's son is very tearful.  For now, he hopes to see if the patient will have some improvement in his overall condition with the BiPAP trial and diuretic  trial.  He knows that all reasonable medical efforts are being made, however, the patient is up against a lot of serious illnesses and has a high likelihood of not doing well and has a high likelihood of ongoing decline and decompensation.  Plan: Time-limited trial of current interventions: BiPAP, IV diuretics, tracking patient's renal indices for at least the next 24 hours or so.  Will likely have to shift to comfort measures after that if there is no meaningful or significant improvement.  Doubt that the patient will be able to tolerate or participate in further palliative radiation attempts.  This has been discussed with the patient's son.  Patient's son is trying to set his affairs in order back in [place] so that he can continue to be present for his father.  Provided active listening and supportive care and compassionate presence.  All of his questions addressed to the best of my ability.  Discussed frankly but compassionately that patient's condition remains tenuous and prognosis remains guarded.       Code Status:    Code Status Orders  (From admission, onward)         Start     Ordered   05/25/19 0036  Full code  Continuous     05/25/19 0037        Code Status History    Date Active Date Inactive Code Status Order ID Comments User Context   10/07/2015 1856 10/10/2015 1352 Full Code [id number]  Kathrynn Speed Inpatient   02/20/2014 2021 02/23/2014 2012 Full Code [id number]  Theodis Blaze, MD Inpatient   Advance Care Planning Activity      DNR.  Prognosis:   Guarded  Discharge Planning:  To Be Determined  Care plan was discussed with patient, son and TRH MD.   Thank you for allowing the Palliative Medicine Team to assist in the care of this patient.   Time In: 11 Time Out: 11.35 Total Time 35 Prolonged Time Billed  No      Greater than 50%  of this time was spent counseling and coordinating care related to the above assessment and plan.  Loistine Chance, MD   Please contact Palliative Medicine Team phone at [phone] for questions and concerns.

## 2019-06-05 ENCOUNTER — Ambulatory Visit
Admit: 2019-06-05 | Discharge: 2019-06-05 | Disposition: A | Discharge status: Home / Self Care | Payer: Medicare Other | Attending: Radiation Oncology | Admitting: Radiation Oncology

## 2019-06-05 DIAGNOSIS — R531 Weakness: Secondary | ICD-10-CM

## 2019-06-05 DIAGNOSIS — C7951 Secondary malignant neoplasm of bone: Secondary | ICD-10-CM | POA: Diagnosis not present

## 2019-06-05 DIAGNOSIS — I1310 Hypertensive heart and chronic kidney disease without heart failure, with stage 1 through stage 4 chronic kidney disease, or unspecified chronic kidney disease: Secondary | ICD-10-CM

## 2019-06-05 LAB — BASIC METABOLIC PANEL
Anion gap: 10 (ref 5–15)
BUN: 68 mg/dL — ABNORMAL HIGH (ref 8–23)
CO2: 32 mmol/L (ref 22–32)
Calcium: 9 mg/dL (ref 8.9–10.3)
Chloride: 88 mmol/L — ABNORMAL LOW (ref 98–111)
Creatinine, Ser: 1.62 mg/dL — ABNORMAL HIGH (ref 0.61–1.24)
GFR calc Af Amer: 48 mL/min — ABNORMAL LOW (ref >60)
GFR calc non Af Amer: 41 mL/min — ABNORMAL LOW (ref >60)
Glucose, Bld: 149 mg/dL — ABNORMAL HIGH (ref 70–99)
Potassium: 5.4 mmol/L — ABNORMAL HIGH (ref 3.5–5.1)
Sodium: 130 mmol/L — ABNORMAL LOW (ref 135–145)

## 2019-06-05 LAB — GLUCOSE, CAPILLARY
Glucose-Capillary: 143 mg/dL — ABNORMAL HIGH (ref 70–99)
Glucose-Capillary: 150 mg/dL — ABNORMAL HIGH (ref 70–99)
Glucose-Capillary: 141 mg/dL — ABNORMAL HIGH (ref 70–99)
Glucose-Capillary: 131 mg/dL — ABNORMAL HIGH (ref 70–99)

## 2019-06-05 LAB — POTASSIUM
Potassium: 5.5 mmol/L — ABNORMAL HIGH (ref 3.5–5.1)
Potassium: 5.3 mmol/L — ABNORMAL HIGH (ref 3.5–5.1)

## 2019-06-05 MED ORDER — HALOPERIDOL LACTATE 2 MG/ML PO CONC
0.5000 mg | ORAL | Status: DC | PRN
  Filled 2019-06-05: qty 0.3

## 2019-06-05 MED ORDER — OXYBUTYNIN CHLORIDE 5 MG PO TABS: 2.5000 mg | ORAL_TABLET | Freq: Four times a day (QID) | ORAL | Status: DC | PRN

## 2019-06-05 MED ORDER — ONDANSETRON 4 MG PO TBDP: 4.0000 mg | ORAL_TABLET | Freq: Four times a day (QID) | ORAL | Status: DC | PRN

## 2019-06-05 MED ORDER — ALBUTEROL SULFATE (2.5 MG/3ML) 0.083% IN NEBU: 3.0000 mL | INHALATION_SOLUTION | Freq: Four times a day (QID) | RESPIRATORY_TRACT | Status: DC

## 2019-06-05 MED ORDER — HALOPERIDOL LACTATE 5 MG/ML IJ SOLN
0.5000 mg | INTRAMUSCULAR | Status: DC | PRN
  Administered 2019-06-05: 0.5 mg via INTRAVENOUS
  Filled 2019-06-05: qty 1

## 2019-06-05 MED ORDER — HALOPERIDOL 1 MG PO TABS: 0.5000 mg | ORAL_TABLET | ORAL | Status: DC | PRN

## 2019-06-05 MED ORDER — QUETIAPINE FUMARATE 50 MG PO TABS: 25.0000 mg | ORAL_TABLET | Freq: Every day | ORAL | Status: DC

## 2019-06-05 MED ORDER — HYDROMORPHONE BOLUS VIA INFUSION
0.5000 mg | INTRAVENOUS | Status: DC | PRN
  Administered 2019-06-05 (×3): 0.5 mg via INTRAVENOUS
  Filled 2019-06-05: qty 1

## 2019-06-05 MED ORDER — SENNA 8.6 MG PO TABS: 1.0000 | ORAL_TABLET | Freq: Every evening | ORAL | Status: DC | PRN

## 2019-06-05 MED ORDER — GLYCOPYRROLATE 0.2 MG/ML IJ SOLN: 0.2000 mg | INTRAMUSCULAR | Status: DC | PRN

## 2019-06-05 MED ORDER — DIPHENHYDRAMINE HCL 50 MG/ML IJ SOLN: 12.5000 mg | INTRAMUSCULAR | Status: DC | PRN

## 2019-06-05 MED ORDER — BIOTENE DRY MOUTH MT LIQD: 15.0000 mL | OROMUCOSAL | Status: DC | PRN

## 2019-06-05 MED ORDER — ALBUTEROL SULFATE (2.5 MG/3ML) 0.083% IN NEBU: 2.5000 mg | INHALATION_SOLUTION | RESPIRATORY_TRACT | Status: DC | PRN

## 2019-06-05 MED ORDER — GLYCOPYRROLATE 1 MG PO TABS: 1.0000 mg | ORAL_TABLET | ORAL | Status: DC | PRN

## 2019-06-05 MED ORDER — DOCUSATE SODIUM 100 MG PO CAPS: 100.0000 mg | ORAL_CAPSULE | Freq: Two times a day (BID) | ORAL | Status: DC

## 2019-06-05 MED ORDER — ACETAMINOPHEN 325 MG PO TABS: 650.0000 mg | ORAL_TABLET | Freq: Four times a day (QID) | ORAL | Status: DC | PRN

## 2019-06-05 MED ORDER — SODIUM CHLORIDE 0.9 % IV SOLN
0.5000 mg/h | INTRAVENOUS | Status: DC
  Administered 2019-06-05: 18:00:00 0.5 mg/h via INTRAVENOUS
  Filled 2019-06-05: qty 5

## 2019-06-05 MED ORDER — LORAZEPAM 1 MG PO TABS: 1.0000 mg | ORAL_TABLET | ORAL | Status: DC | PRN

## 2019-06-05 MED ORDER — ACETAMINOPHEN 650 MG RE SUPP: 650.0000 mg | Freq: Four times a day (QID) | RECTAL | Status: DC | PRN

## 2019-06-05 MED ORDER — FLEET ENEMA 7-19 GM/118ML RE ENEM: 1.0000 | ENEMA | Freq: Every day | RECTAL | Status: DC | PRN

## 2019-06-05 MED ORDER — BISACODYL 10 MG RE SUPP: 10.0000 mg | Freq: Every day | RECTAL | Status: DC | PRN

## 2019-06-05 MED ORDER — ONDANSETRON HCL 4 MG/2ML IJ SOLN: 4.0000 mg | Freq: Four times a day (QID) | INTRAMUSCULAR | Status: DC | PRN

## 2019-06-05 MED ORDER — LORAZEPAM 2 MG/ML IJ SOLN: 1.0000 mg | INTRAMUSCULAR | Status: DC | PRN

## 2019-06-05 MED ORDER — CHLORPROMAZINE HCL 25 MG PO TABS
25.0000 mg | ORAL_TABLET | Freq: Four times a day (QID) | ORAL | Status: DC | PRN
  Filled 2019-06-05: qty 1

## 2019-06-05 MED ORDER — LORAZEPAM 2 MG/ML PO CONC: 1.0000 mg | ORAL | Status: DC | PRN

## 2019-06-05 MED ORDER — PANTOPRAZOLE SODIUM 40 MG PO TBEC
40.0000 mg | DELAYED_RELEASE_TABLET | Freq: Every day | ORAL | Status: DC
  Filled 2019-06-05: qty 1

## 2019-06-05 MED ORDER — POLYVINYL ALCOHOL 1.4 % OP SOLN: 1.0000 [drp] | Freq: Four times a day (QID) | OPHTHALMIC | Status: DC | PRN

## 2019-06-05 NOTE — Significant Event (Signed)
I had a long discussion with the patient's son today regarding goals of care. After discussing the patient's current poor prognosis and limited options moving forward, and addressing any concerns, family has decided to move ahead with comfort measures.    I have notified palliative care of the change  Comfort measures initiated  Full note to follow  Marva Panda, DO

## 2019-06-05 NOTE — Progress Notes (Signed)
PROGRESS NOTE    Shane Watson    Code Status: DNR  VFI:[id number] DOB: 01-Jun-1946 DOA: 06/18/2019  PCP: Shirline Frees, MD    Hospital Summary   73 year old male with history of hypertension, chronic back pain/neuropathy, BPH, CKD stage III, drug-eluting stent 12/18/2010, COPD, CAD, diabetes mellitus, GERD, obstructive sleep apnea on CPAP evaluated by PCPfew weeks back forback pain/abdominal pain, underwent outpatient work upwithCT scan of the abdomen pelvis on 10/12showinglower mediastinal and abdominal/pelvic lymphadenopathy related to lymphoma or metastatic disease as well astiny pulmonary nodules in the right lung base which were stable since the prior study->PET scan performed on 05/04/2019. PET scan results showed hypermetabolic mediastinal prevascular nodes and hypermetabolic retroperitoneal and mesenteric nodes consistent with lymphoma versus granulomatous disease (sarcoidosis),lytic lesion in the T7 vertebral body with moderate metabolic activity concerning for skeletal metastasis versus sarcoid lesion, small pulmonary nodules do not have associated metabolic activity-->patient underwent CT-guided retroperitoneal lymph node biopsy by IR on 10/22and postprocedure complained of pain radiating to his groin-NSAIDs were recommended. Pathology results indicate undifferentiated adenocarcinoma-->He was referred for oncology evaluation and was scheduled to seeDr Irene Limbo on 11/10.  Patient had worsening back pain and presented to Urgent care on11/2and prescribed 5mg  Oxycodone-1 tab was not enough and 2 tabs were making him too drowsy. Patientwas alsoseen by his primary pulmonologistrecently as outpatient for chest/abdominal pain complaints- felt to havesubdiaphragmatic pain/bloating and dietary changes were advised. Last colonoscopy was 3 years back.  [facility] complaints of persistentbackpainradiating to left anterior abdomen.He also reports  worsening lower extremity edema.   He has since been evaluated by palliative care for goals of care pain management and has underwent 5/10 radiation treatments.  He was placed on heart failure protocol for heart failure exacerbation.  EF 55 to 60%, but unfortunately did not respond well to Lasix and had worsening cardiorenal syndrome.  11/14: Plan was for discharge home with hospice today however patient looked more toxic and on lab work patient had hyperkalemia of 6.0.  Did not respond with Lasix.  Albuterol was ordered but was not given.  Repeat lab work showed hyperkalemia of 7.0.  EKG with peaked T waves in anterior leads and normal QT.  Given calcium gluconate, insulin 10 units with dextrose.  Nephrology consulted.  Patient transferred to stepdown unit  11/15: Difficult event increased work of breathing with desaturation despite 10 L high flow nasal cannula and low urine output despite high doses of Lasix.  Patient placed on BiPAP given Ativan and Dr. Rowe Pavy made aware patient's declining status.  Goals of care discussion with son family decided to pursue BiPAP for the next 24 hours however unfortunately the patient only tolerated the mask for a couple hours and was placed back on high flow nasal cannula.  11/16: comfort measures initiated  A & P   Principal Problem:   ARF (acute renal failure) (HCC) Active Problems:   CKD (chronic kidney disease) stage 4, GFR 15-29 ml/min (HCC)   Coronary artery disease due to lipid rich plaque   COPD (chronic obstructive pulmonary disease) (HCC)   Back pain   Diastolic CHF (HCC)   Adenocarcinoma, 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HCC)   Bone metastases ([place])   Counseling regarding advance care planning and goals of care   1. Acute respiratory failure secondary to diastolic heart failure exacerbation, COPD exacerbation, likely with OSA and OHV syndrome  2. Acute metabolic encephalopathy multifactorial: Suspected hypercapnia, medication induced, heart failure  3.  Hyperkalemia improved with Lokelma. 4. Hyponatremia  5. Diastolic  heart failure exacerbation 6. AKI on CKD 3, cardiorenal 7. Intractable back pain secondary to pleuritic bony lesions metastatic adenocarcinoma with unknown primary 8. Metastatic poorly differentiated adenocarcinoma of unknown primary with bony mets completed 5/10 radiation treatments 9. Abdominal pain secondary to urinary retention from post obstruction foley in place 10. COPD exacerbation likely secondary to heart failure exacerbation  11. Type 2 diabetes  12. History of palpitations  13. CAD status post DES 2012  14. Hyperlipidemia 15. Morbid obesity BMI 49 16. Bilateral viral conjunctivitis 17. GERD   Comfort measures initiated today after discussion with patient's son at the bedside. -Titrate oxygen to comfort -Haldol every 4 hours as needed for agitation or delirium -Continue Foley catheter for comfort -Dilaudid infusion with bolus every 15 minute as needed for uncontrolled pain -Bowel regimen for comfort -Robinul for secretions -Oral care -Oxybutynin for bladder spasms   DVT prophylaxis: None Diet: Comfort Family Communication: Family updated at bedside Disposition Plan: Comfort measures  Consultants  Palliative Oncology Radiation oncology Nephrology  Procedures  XRT x5/10      Subjective  After discussion with the patient at bedside he stated that he was having some increased work of breathing and was not feeling well today.  Was upset about the BiPAP mask last night and having to use restraints.  Also states he has not had a bowel movement.  I had a discussion with the patient's son outside the room regarding goals of care.  After addressing all of his concerns and answering questions he may have had, he decided to proceed with comfort measures.  I agreed that this was the best option for his father and expressed understanding and had difficult this may be.  Objective   Vitals:   06/05/19 1400  06/05/19 1531 06/05/19 1600 06/05/19 1700  BP: (!) 130/51  (!) 131/45 (!) 146/46  Pulse: (!) 104  98 (!) 106  Resp: (!) 26  17 (!) 24  Temp:      TempSrc:      SpO2: 92% 94% 93% 92%  Weight:      Height:        Intake/Output Summary (Last 24 hours) at 06/05/2019 1729 Last data filed at 06/05/2019 1700 Gross per 24 hour  Intake 324.07 ml  Output 1700 ml  Net -1375.93 ml   Filed Weights   06/03/19 1630 06/04/19 0500 06/05/19 0500  Weight: (!) 161.2 kg (!) 165.8 kg (!) 165.8 kg    Examination:  Physical Exam Vitals signs and nursing note reviewed. Exam conducted with a chaperone present.  Constitutional:      Appearance: He is ill-appearing and diaphoretic.     Comments: Awake, somnolent  HENT:     Mouth/Throat:     Comments: Secretions Neck:     Musculoskeletal: Normal range of motion.  Cardiovascular:     Rate and Rhythm: Regular rhythm. Tachycardia present.  Pulmonary:     Breath sounds: Wheezing and rales present.     Comments: Abdominal breathing and accessory muscle use Abdominal:     General: Bowel sounds are normal.     Palpations: Abdomen is soft.  Musculoskeletal:        General: No tenderness.     Comments: 4+ bilateral lower extremity pitting edema  Skin:    Coloration: Skin is not jaundiced.  Neurological:     Mental Status: He is disoriented.     Comments: Somnolent but responds appropriately to questioning  Psychiatric:        Behavior:  Behavior is not agitated. Behavior is cooperative.     Data Reviewed: I have personally reviewed following labs and imaging studies  CBC: Recent Labs  Lab 05/30/19 0339 05/31/19 0357 06/01/19 0553 06/02/19 0426 06/04/19 0556  WBC 7.2 7.6 7.8 8.3 6.6  HGB 11.5* 12.4* 12.4* 12.4* 11.8*  HCT 36.3* 39.4 39.4 39.3 36.7*  MCV 91.2 91.4 90.8 91.0 90.4  PLT 161 164 159 179 161*   Basic Metabolic Panel: Recent Labs  Lab 06/01/19 0553 06/02/19 0426  06/03/19 0735 06/03/19 1243 06/03/19 1448  06/04/19  0556  06/04/19 1847 06/04/19 2209 06/05/19 0200 06/05/19 0222 06/05/19 1424  NA 128* 128*   < > 128* 126* 126*  --  128*  --   --   --   --  130*  --   K 5.5* 5.4*   < > 5.9* 7.0* 6.1*   < > 6.1*   < > 5.7* 5.6* 5.5* 5.4* 5.3*  CL 89* 89*   < > 89* 89* 88*  --  88*  --   --   --   --  88*  --   CO2 30 29   < > 30 28 31   --  31  --   --   --   --  32  --   GLUCOSE 127* 125*   < > 139* 177* 196*  --  118*  --   --   --   --  149*  --   BUN 43* 46*   < > 54* 57* 54*  --  60*  --   --   --   --  68*  --   CREATININE 1.45* 1.45*   < > 1.62* 1.63* 1.61*  --  1.57*  --   --   --   --  1.62*  --   CALCIUM 9.2 9.2   < > 9.1 9.0 9.2  --  9.4  --   --   --   --  9.0  --   MG 2.7* 2.5*  --   --   --   --   --   --   --   --   --   --   --   --    < > = values in this interval not displayed.   GFR: Estimated Creatinine Clearance: 63.1 mL/min (A) (by C-G formula based on SCr of 1.62 mg/dL (H)). Liver Function Tests: No results for input(s): AST, ALT, ALKPHOS, BILITOT, PROT, ALBUMIN in the last 168 hours. No results for input(s): LIPASE, AMYLASE in the last 168 hours. No results for input(s): AMMONIA in the last 168 hours. Coagulation Profile: No results for input(s): INR, PROTIME in the last 168 hours. Cardiac Enzymes: No results for input(s): CKTOTAL, CKMB, CKMBINDEX, TROPONINI in the last 168 hours. BNP (last 3 results) No results for input(s): PROBNP in the last 8760 hours. HbA1C: No results for input(s): HGBA1C in the last 72 hours. CBG: Recent Labs  Lab 06/04/19 1632 06/04/19 2137 06/05/19 0743 06/05/19 1155 06/05/19 1633  GLUCAP 154* 130* 143* 150* 141*   Lipid Profile: No results for input(s): CHOL, HDL, LDLCALC, TRIG, CHOLHDL, LDLDIRECT in the last 72 hours. Thyroid Function Tests: No results for input(s): TSH, T4TOTAL, FREET4, T3FREE, THYROIDAB in the last 72 hours. Anemia Panel: No results for input(s): VITAMINB12, FOLATE, FERRITIN, TIBC, IRON, RETICCTPCT in the last 72  hours. Sepsis Labs: No results for input(s): PROCALCITON, LATICACIDVEN in the last 168  hours.  No results found for this or any previous visit (from the past 240 hour(s)).       Radiology Studies: No results found.      Scheduled Meds: . Chlorhexidine Gluconate Cloth  6 each Topical Daily  . lidocaine  3 patch Transdermal Q24H  . pantoprazole  40 mg Oral Daily  . polyethylene glycol  17 g Oral Daily   Continuous Infusions: . HYDROmorphone       LOS: 11 days    Time spent: 50 minutes with over 50% of the time coordinating the patient's care    Harold Hedge, DO [facility] Pager [phone]  If 7PM-7AM, please contact night-coverage [email or web address] Password [email or web address] 06/05/2019, 5:29 PM

## 2019-06-05 NOTE — Progress Notes (Signed)
Daily Progress Note   Patient Name: Shane Watson       Date: 06/05/2019 DOB: [birth year]  Age: 73 y.o. MRN#: [id number] Attending Physician: Shane Hedge, MD Primary Care Physician: Shane Frees, MD Admit Date: 06/07/2019  Reason for Consultation/Follow-up: Establishing goals of care and Pain control  Subjective: Patient currently off BIPAP, he is on O2 [place]. Son at bedside. Patient with cough/increasing sputum, he is requiring assistance with suction.   Awaiting going for radiation today, re discussed patient's current blood work and renal indices with son at bedside.   See below.    See below.    Length of Stay: 11  Current Medications: Scheduled Meds:  . albuterol  2.5 mg Nebulization Once  . aspirin EC  81 mg Oral Daily  . atorvastatin  40 mg Oral Daily  . Chlorhexidine Gluconate Cloth  6 each Topical Daily  . colesevelam  625 mg Oral BID WC  . doxazosin  8 mg Oral QHS  . enoxaparin (LOVENOX) injection  80 mg Subcutaneous Q24H  . famotidine  20 mg Oral QHS  . insulin aspart  0-15 Units Subcutaneous TID WC  . insulin aspart  0-5 Units Subcutaneous QHS  . ipratropium-albuterol  3 mL Nebulization Q6H  . lidocaine  3 patch Transdermal Q24H  . linagliptin  5 mg Oral Daily  . loratadine  10 mg Oral Daily  . magnesium oxide  400 mg Oral Daily  . meclizine  25 mg Oral Daily  . mouth rinse  15 mL Mouth Rinse BID  . mometasone-formoterol  2 puff Inhalation BID  . pantoprazole  40 mg Oral Daily  . polyethylene glycol  17 g Oral Daily  . senna  1 tablet Oral Daily  . sodium zirconium cyclosilicate  10 g Oral TID    Continuous Infusions: . furosemide Stopped (06/05/19 0641)    PRN Meds: acetaminophen **OR** acetaminophen, albuterol, diphenoxylate-atropine, hydrALAZINE,  HYDROmorphone (DILAUDID) injection, metoprolol tartrate, naLOXone (NARCAN)  injection, ondansetron (ZOFRAN) IV, opium-belladonna, oxyCODONE, polyvinyl alcohol  Physical Exam         General: Currently on [name]. Appears more weaker, and "tired" HEENT: No bruits, no goiter, no JVD Heart: Regular rate and rhythm. No murmur appreciated. Lungs: Diminished breath sounds Abdomen: Soft, nontender, nondistended, positive bowel sounds.  Obese. Skin: Warm and dry Has  a lot of LE edema Awake and reasonably alert, not as much as on 06-04-2019.    Vital Signs: BP (!) 120/58   Pulse (!) 101   Temp 98.1 F (36.7 C) (Oral)   Resp 17   Ht 5\' 11"  (1.803 m)   Wt (!) 165.8 kg   SpO2 95%   BMI 50.98 kg/m  SpO2: SpO2: 95 % O2 Device: O2 Device: Nasal Cannula O2 Flow Rate: O2 Flow Rate (L/min): 12 L/min  Intake/output summary:   Intake/Output Summary (Last 24 hours) at 06/05/2019 1215 Last data filed at 06/05/2019 5621 Gross per 24 hour  Intake 324.07 ml  Output 1700 ml  Net -1375.93 ml   LBM: Last BM Date: 06/01/19 Baseline Weight: Weight: (!) 157.9 kg Most recent weight: Weight: (!) 165.8 kg       Palliative Assessment/Data:      Patient Active Problem List   Diagnosis Date Noted  . Adenocarcinoma, 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[place])   . Bone metastases ([place])   . Counseling regarding advance care planning and goals of care   . ARF (acute renal failure) ([place]) 05/25/2019  . Back pain 05/25/2019  . Diastolic CHF ([place]) 30/86/5784  . IBS (irritable bowel syndrome) 05/15/2019  . Acute bronchitis with COPD ([place]) 07/27/2018  . Abnormal CT scan of lung 03/30/2017  . Morbid obesity ([place]) 10/08/2015  . Lower extremity edema 10/08/2015  . S/P shoulder replacement 10/08/2015  . Fracture, humerus, proximal 10/07/2015  . Coronary artery disease   . Hypertensive heart disease   . CKD (chronic kidney disease), stage III   . Hyperlipidemia   . COPD (chronic obstructive pulmonary disease) ([place])   . Morbid obesity  due to excess calories ([place]) 02/25/2015  . Dyspnea 02/04/2015  . CKD (chronic kidney disease) 10/09/2014  . Coronary artery disease due to lipid rich plaque 10/09/2014  . Chronic respiratory failure with hypercapnia ([place]) 05/08/2014  . Cellulitis of left lower extremity 02/20/2014  . Leukocytosis, unspecified 02/20/2014  . COPD exacerbation ([place]) 02/20/2014  . Dyslipidemia 02/20/2014  . CKD (chronic kidney disease) stage 4, GFR 15-29 ml/min (HCC) 02/20/2014  . Multiple pulmonary nodules 11/01/2013  . Asthmatic bronchitis , chronic (HCC)   FEV1/FVC nl p bronchodilators  05/03/2013  . Obstructive sleep apnea 05/02/2013    Palliative Care Assessment & Plan   Patient Profile: 73 y.o. male  with past medical history of hypertension, hyperlipidemia, diabetes with peripheral neuropathy, CKD stage III, CAD, anemia, BPH admitted on 06/01/2019 with worsening back pain.  Earlier this month, patient had CT of abdomen pelvis that showed lower mediastinal and abdominal/pelvic adenopathy which could be related to lymphoma or metastatic disease.  There are also multiple lucent bone lesions concerning for metastatic disease.  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  Staining profile is nonspecific.  He is currently admitted for worsened back pain with plan to start radiation therapy on Monday.  Palliative consulted for pain management and goals of care.  Assessment: Patient Active Problem List   Diagnosis Date Noted  . 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 Bone metastases ([place])   . Counseling regarding advance care planning and goals of care   . ARF (acute renal failure) ([place]) 05/25/2019  . Back pain 05/25/2019  . Diastolic CHF ([place]) 69/62/9528  . IBS (irritable bowel syndrome) 05/15/2019  . Acute bronchitis with COPD ([place]) 07/27/2018  . Abnormal CT scan of lung 03/30/2017  . Morbid  obesity ([place]) 10/08/2015  . Lower extremity edema 10/08/2015  . S/P shoulder replacement 10/08/2015  . Fracture, humerus, proximal 10/07/2015  . Coronary artery disease   . Hypertensive heart disease   . CKD (chronic kidney disease), stage III   . Hyperlipidemia   . COPD (chronic obstructive pulmonary disease) ([place])   . Morbid obesity due to excess calories ([place]) 02/25/2015  . Dyspnea 02/04/2015  . CKD (chronic kidney disease) 10/09/2014  . Coronary artery disease due to lipid rich plaque 10/09/2014  . Chronic respiratory failure with hypercapnia ([place]) 05/08/2014  . Cellulitis of left lower extremity 02/20/2014  . Leukocytosis, unspecified 02/20/2014  . COPD exacerbation ([place]) 02/20/2014  . Dyslipidemia 02/20/2014  . CKD (chronic kidney disease) stage 4, GFR 15-29 ml/min (HCC) 02/20/2014  . Multiple pulmonary nodules 11/01/2013  . Asthmatic bronchitis , chronic (HCC)   FEV1/FVC nl p bronchodilators  05/03/2013  . Obstructive sleep apnea 05/02/2013     Discussions/Recommendations/Plan:  Family meeting with patient and son at bedside today: I reviewed with the patient's son about current situation.  Discussed that the patient has recent diagnosis of adenocarcinoma of unknown primary.  Patient with known diffuse abdominal/pelvic lymph nodes and a lytic lesions.  Patient was on palliative radiation.  We discussed about scope of palliative radiation.  Discussed with him in detail about high suspicion for cardiorenal syndrome and what that entails.  Current medical management explained in detail.  All of his questions addressed to the best of my ability.  Patient states he is feeling weaker. I discussed with his son about the patient's ongoing decline, and how his physical appearance is more reflective of ongoing decline and weakness, diminishing PO intake, even as some blood work might be hinting at reasonable improvement, as far as hyperkalemia is concerned.   Encouraged him to think  about overall condition and best possible disposition options for the patient.   We then reviewed his pain medications use, discussed about appropriate symptom management options.   PMT to continue to follow. No change in goals of care for today, as per my discussions with both patient and son.         Code Status:    Code Status Orders  (From admission, onward)         Start     Ordered   05/25/19 0036  Full code  Continuous     05/25/19 0037        Code Status History    Date Active Date Inactive Code Status Order ID Comments User Context   10/07/2015 1856 10/10/2015 1352 Full Code [id number]  Kathrynn Speed Inpatient   02/20/2014 2021 02/23/2014 2012 Full Code [id number]  Theodis Blaze, MD Inpatient   Advance Care Planning Activity      DNR.   Prognosis:   Guarded  Discharge Planning:  To Be Determined  Care plan was discussed with patient, son    Thank you for allowing the Palliative Medicine Team to assist in the care of this patient.   Time In: 11 Time Out: 11.35 Total Time 35 Prolonged Time Billed  No      Greater than 50%  of this time was spent counseling and coordinating care related to the above assessment and plan.  Loistine Chance, MD  Please contact Palliative Medicine Team phone at [phone] for questions and concerns.

## 2019-06-05 NOTE — Progress Notes (Signed)
[facility] Therapy Treatment Record Phone [phone]   Radiation Therapy was administered to Shane Watson on: 06/05/2019  1:37 PM and was treatment # 6 out of a planned course of 10 treatments.  Radiation Treatment  1). Beam photons with 6-10 energy and Photons 10-19 MeV  2). Brachytherapy None  3). Stereotactic Radiosurgery None  4). Other Radiation None     Marylene Masek, Nikitta Sobiech, RT (T)

## 2019-06-05 NOTE — Progress Notes (Signed)
PT Cancellation Note  Patient Details Name: Shane Watson MRN: [id number] DOB: [birth year]   Cancelled Treatment:     pt unable to tolerate due to medical status.     Rica Koyanagi  PTA Acute  Rehabilitation Services Pager      [phone] Office      [phone]

## 2019-06-05 NOTE — Progress Notes (Signed)
[facility] Progress Note  Subjective: 1700 of UOP yest- renal function labs pretty stable-  K is better at 5.4-  He seems more alert than what was described. Trying to have BM-  For radiation today ?    Vitals:   06/05/19 0500 06/05/19 0600 06/05/19 0753 06/05/19 0823  BP: (!) 143/55 (!) 120/58    Pulse: (!) 103 (!) 101    Resp: 19 17    Temp:    98.1 F (36.7 C)  TempSrc:    Oral  SpO2: 97% 97% 95%   Weight: (!) 165.8 kg     Height:        Inpatient medications: . albuterol  2.5 mg Nebulization Once  . aspirin EC  81 mg Oral Daily  . atorvastatin  40 mg Oral Daily  . Chlorhexidine Gluconate Cloth  6 each Topical Daily  . colesevelam  625 mg Oral BID WC  . doxazosin  8 mg Oral QHS  . enoxaparin (LOVENOX) injection  80 mg Subcutaneous Q24H  . famotidine  20 mg Oral QHS  . insulin aspart  0-15 Units Subcutaneous TID WC  . insulin aspart  0-5 Units Subcutaneous QHS  . ipratropium-albuterol  3 mL Nebulization Q6H  . lidocaine  3 patch Transdermal Q24H  . linagliptin  5 mg Oral Daily  . loratadine  10 mg Oral Daily  . magnesium oxide  400 mg Oral Daily  . meclizine  25 mg Oral Daily  . mouth rinse  15 mL Mouth Rinse BID  . mometasone-formoterol  2 puff Inhalation BID  . pantoprazole  40 mg Oral Daily  . polyethylene glycol  17 g Oral Daily  . senna  1 tablet Oral Daily  . sodium zirconium cyclosilicate  10 g Oral TID   . furosemide Stopped (06/05/19 0641)   acetaminophen **OR** acetaminophen, albuterol, diphenoxylate-atropine, hydrALAZINE, HYDROmorphone (DILAUDID) injection, metoprolol tartrate, naLOXone (NARCAN)  injection, ondansetron (ZOFRAN) IV, opium-belladonna, oxyCODONE, polyvinyl alcohol    Exam: Gen elderly obese WM , falls asleep but MS seems better than previously described No rash, cyanosis or gangrene Sclera anicteric, throat clear  No jvd or bruits Chest bilat rales, 1/3 up post RRR no MRG Abd soft ntnd no mass or ascites +bs, markedly  obese GU normal male  MS no joint effusions or deformity Ext severe bilat LE edema from the feet to the hips 3-4+ Neuro is groggy, falls asleep off and on,  follows simple commands    Home meds:  - furosemide 40 qd/ diltiazem sr 90/ doxazosin 8 hs/ hctz 12.5/ losartan 100  - home O2 2L/ spiriva respimat/ budesonide-formoterol bid  - aspirin 81/ colesevelam 625 bid/ sl ntg prn  - oxycodone -aceta qid prn/ gabapentin 300 bid  - linagliptin 5 qd  - famotidine 20 hs  - prn's/ vitamins/ supplements    UA 11/14 cloudy >50 wbc, >50 rbc, 30 protein   Una <10, UCreat 210    CXR 11/11 - vasc congestion, ? New basilar densities    CXR 11/14 - worsening edema IS   Assessment/ Plan: 1. AKI on CKD 3b - baseline creat 1.3- 1.7.  Creat here 1.6 today.  Cardiorenal issue w/ severe peripheral edema and pulm edema. Acute decline previously , raised IV lasix yest to 120mg  q 8 hours and is seeming to respond so far.  Kidney function stable.   Patient would not be candidate for dialysis therapy/  Pall care is seeing and working w/ family/ patient. Pt is  DNR.  2. Hyperkalemia - renal diet, lokelma tid and lasix-  K is best it has been   3. AdenoCa of unknown primary - w/ vertebral mets and diffuse abdominal LAN 4. HTN - bp's okay.  Volume overloaded-  On lasix 120 TID with results noted 5. Chron resp failure / COPD - on nasal O2 6. DM2 - on oral agents  Dispo-  From a renal standpoint is stable-  K and volume controllable for the moment with IV lasix and lokelma.  Not a candidate for dialysis.  Not sure what end point is however.  I suspect still the most advantageous outcome for patient and family would be to transition to comfort care.  Renal will sign off-  Can cont to titrate lasix and lokelma to desired results     Louis Meckel  06/05/2019, 12:17 PM  Iron/TIBC/Ferritin/ %Sat No results found for: IRON, TIBC, FERRITIN, IRONPCTSAT Recent Labs  Lab 06/05/19 0222  NA 130*  K  5.4*  CL 88*  CO2 32  GLUCOSE 149*  BUN 68*  CREATININE 1.62*  CALCIUM 9.0   No results for input(s): AST, ALT, ALKPHOS, BILITOT, PROT in the last 168 hours. Recent Labs  Lab 06/04/19 0556  WBC 6.6  HGB 11.8*  HCT 36.7*  PLT 149*

## 2019-06-05 NOTE — Progress Notes (Signed)
Per conversation with ICU nurse following physician rounding patient can receive radiation treatment today. Explained the patient is scheduled for 2 pm today and our transporter will be up to assist at 1345. Nurse verbalized understanding. Informed L3 therapist of this finding. They too verbalized understanding.

## 2019-06-05 NOTE — Progress Notes (Signed)
Pt taken off BIPAP at 0755 and placed on 15L [place]

## 2019-06-06 ENCOUNTER — Encounter: Payer: Self-pay | Admitting: Radiation Oncology

## 2019-06-06 ENCOUNTER — Ambulatory Visit: Payer: Medicare Other

## 2019-06-07 ENCOUNTER — Ambulatory Visit: Payer: Medicare Other

## 2019-06-08 ENCOUNTER — Ambulatory Visit: Payer: Medicare Other

## 2019-06-09 ENCOUNTER — Ambulatory Visit: Payer: Medicare Other

## 2019-06-20 NOTE — Progress Notes (Signed)
This nurse wasted 75 mL of HYDROmorphone (DILAUDID) 50 mg in sodium chloride 0.9 % 100 mL (0.5 mg/mL) infusion in the steri container. The waste was verified and witnessed by Gloris Ham, RN.

## 2019-06-20 NOTE — Progress Notes (Signed)
Patient has expired at Jul 03, 2019 05:20 am. Patient's heart and lungs were auscultated for full two minutes. Family, MD and [facility] notified. Nursing staff at bedside.

## 2019-06-20 NOTE — Progress Notes (Signed)
Patient has passed away. Family at bedside. Will transport to [facility] after family leaves.

## 2019-06-20 NOTE — Progress Notes (Signed)
Patient's systolic has went from 397Q to 100s. Patient is comfort care. Will continue to make comfortable as possible.

## 2019-06-20 NOTE — Progress Notes (Signed)
This RN witnessed Editor, commissioning, RN waste 75 mL of HYDROmorphone (DILAUDID) 50 mg in sodium chloride 0.9% 100 mL infusion in the steri cycle.

## 2019-06-20 NOTE — Progress Notes (Signed)
  Radiation Oncology         [phone] ________________________________  Name: Shane Watson MRN: [id number]  Date: 07/06/2019  DOB: 1946/06/29  End of Treatment Note  Diagnosis:   73 yo man with painful bony metastases at T7 and L2 secondary to newly diagnosed metastatic poorly differentiated adenocarcinoma of unknown primary.  Indication for treatment:  Palliative       Radiation treatment dates:   05/29/2019 - 06/05/2019  Site/dose:    1. L1-L3 / received 18 Gy in 6 fractions out of a planned 30 Gy in 10 fractions 2. T5-T9 / received 18 Gy in 6 fractions out of a planned 30 Gy in 10 fractions  Beams/energy:    1. 3D, photons / 15X 2. 3D, photons / 15X, 10X  Narrative: The patient tolerated radiation treatment relatively well, but he continued to decline.  Plan: The patient unfortunately passed away on July 06, 2019. ________________________________  Sheral Apley. Tammi Klippel, M.D.   This document serves as a record of services personally performed by Tyler Pita, MD. It was created on his behalf by Wilburn Mylar, a trained medical scribe. The creation of this record is based on the scribe's personal observations and the provider's statements to them. This document has been checked and approved by the attending provider.

## 2019-06-20 NOTE — Death Summary Note (Signed)
DEATH SUMMARY   Patient Details  Name: Shane Watson MRN: [id number] DOB: Feb 13, 1946  Admission/Discharge Information   Admit Date:  06/16/2019  Date of Death: Date of Death: 29-Jun-2019  Time of Death: Time of Death: 0520  Length of Stay: 10/22/2022  Referring Physician: Shirline Frees, MD   Reason(s) for Hospitalization  Intractable back pain due to metastatic cancer with acute hypoxic respiratory failure secondary to diastolic heart failure and COPD exacerbation with cardiorenal syndrome  Diagnoses  Preliminary cause of death: Death due to metastatic cancer [facility]) Secondary Diagnoses (including complications and co-morbidities):  Principal Problem:   ARF (acute renal failure) ([place]) Active Problems:   CKD (chronic kidney disease) stage 4, GFR 15-29 ml/min ([facility])   Coronary artery disease due to lipid rich plaque   COPD (chronic obstructive pulmonary disease) ([facility])   Back pain   Diastolic CHF ([place])   Adenocarcinoma, 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[place])   Bone metastases ([place])   Counseling regarding advance care planning and goals of care   Brief Hospital Course (including significant findings, care, treatment, and services provided and events leading to death)  73 year old male with history of hypertension, chronic back pain/neuropathy, BPH, CKD stage III, drug-eluting stent 12/18/2010, COPD, CAD, diabetes mellitus, GERD, obstructive sleep apnea on CPAP evaluated by PCPfew weeks back forback pain/abdominal pain, underwent outpatient work upwithCT scan of the abdomen pelvis on 10/12showinglower mediastinal and abdominal/pelvic lymphadenopathy related to lymphoma or metastatic disease as well astiny pulmonary nodules in the right lung base which were stable since the prior study->PET scan performed on 05/04/2019. PET scan results showed hypermetabolic mediastinal prevascular nodes and hypermetabolic retroperitoneal and mesenteric nodes consistent with lymphoma versus granulomatous disease  (sarcoidosis),lytic lesion in the T7 vertebral body with moderate metabolic activity concerning for skeletal metastasis versus sarcoid lesion, small pulmonary nodules do not have associated metabolic activity-->patient underwent CT-guided retroperitoneal lymph node biopsy by IR on 10/22and postprocedure complained of pain radiating to his groin-NSAIDs were recommended. Pathology results indicate undifferentiated adenocarcinoma-->He was referred for oncology evaluation and was scheduled to seeDr Irene Limbo on 11/10. Patient had worsening back pain and presented to Urgent care on11/2and prescribed 5mg  Oxycodone-1 tab was not enough and 2 tabs were making him too drowsy. Patientwas alsoseen by his primary pulmonologistrecently as outpatient for chest/abdominal pain complaints- felt to havesubdiaphragmatic pain/bloating and dietary changes were advised. Last colonoscopy was 3 years back.  [facility] complaints of persistentbackpainradiating to left anterior abdomen.He also reports worsening lower extremity edema.   He has since been evaluated by palliative care for goals of care pain management and has underwent 5/10 radiation treatments.  He was placed on heart failure protocol for heart failure exacerbation.  EF 55 to 60%, but unfortunately did not respond well to Lasix and had worsening cardiorenal syndrome.  11/14: Plan was for discharge home with hospice today however patient looked more toxic and on lab work patient had hyperkalemia of 6.0.  Did not respond with Lasix.  Albuterol was ordered but was not given.  Repeat lab work showed hyperkalemia of 7.0.  EKG with peaked T waves in anterior leads and normal QT.  Given calcium gluconate, insulin 10 units with dextrose.  Nephrology consulted.  Patient transferred to stepdown unit  11/15: Difficult event increased work of breathing with desaturation despite 10 L high flow nasal cannula and low urine output despite  high doses of Lasix.  Patient placed on BiPAP given Ativan and Dr. Rowe Pavy made aware patient's declining status.  Goals of care discussion with  son family decided to pursue BiPAP for the next 24 hours however unfortunately the patient only tolerated the mask for a couple hours and was placed back on high flow nasal cannula.  11/16: comfort measures initiated  23-Jun-2023: Patient pronounced deceased at 5:20 AM  Physical Exam Vitals signs and nursing note reviewed.  Constitutional:      Comments: No spontaneous breathing movements   Cardiovascular:     Comments: asystolic Musculoskeletal:     Comments: No movement  Skin:    Coloration: Skin is pale.  Neurological:     Comments: No neurologic response      Pertinent Labs and Studies  Significant Diagnostic Studies US Renal  Result Date: 06/03/2019 CLINICAL DATA:  Acute kidney injury. EXAM: RENAL / URINARY TRACT ULTRASOUND COMPLETE COMPARISON:  Abdomen and pelvis CT dated 05/26/2019 FINDINGS: Right Kidney: Renal measurements: Difficult to obtain due to poor visualization due to the patient's body habitus, measuring approximately 13.3 x 6.4 x 5.6 cm = volume: Approximately 249 mL. Difficult to determine with an appearance more echogenic superiorly and inferiorly and poorly defined. This is better visualized in the midportion of the kidney, with normal echogenicity. No hydronephrosis. Left Kidney: Renal measurements: Difficult to obtain due to poor visualization due to the patient's body habitus, measuring approximately 12.0 x 6.2 x 5.6 cm. = volume: Approximately 214 mL. Echogenicity difficult to determine due to the patient's body habitus, appearing mildly echogenic in some areas and better visualized and normal in echotexture in others. 6.3 cm grossly simple cyst. Otherwise, no mass or hydronephrosis visualized. Bladder: Not visualized with a Foley catheter in place. Other: None. IMPRESSION: 1. Very limited examination due to the patient's body  habitus and a very poor visualization of the kidneys. 2. No gross abnormality seen. Electronically Signed   By: Claudie Revering M.D.   On: 06/03/2019 17:35   Ct Biopsy  Result Date: 05/11/2019 INDICATION: Retroperitoneal adenopathy, concern for lymphoproliferative disorder EXAM: CT-GUIDED BIOPSY LEFT RETROPERITONEAL BULKY ADENOPATHY MEDICATIONS: 1% lidocaine local ANESTHESIA/SEDATION: 0.5 mg IV Versed; 12.5 mcg IV Fentanyl Moderate Sedation Time:  13 minutes The patient was continuously monitored during the procedure by the interventional radiology nurse under my direct supervision. PROCEDURE: The procedure, risks, benefits, and alternatives were explained to the patient. Questions regarding the procedure were encouraged and answered. The patient understands and consents to the procedure. Previous imaging reviewed. Patient positioned prone. Noncontrast localization CT performed. The left bulky retroperitoneal periaortic adenopathy was localized and marked. Under sterile conditions and local anesthesia, a 17 gauge 16.8 cm guide was advanced to the adenopathy. Needle position confirmed with CT. 18 gauge core biopsies obtained. Samples placed in saline. Needle removed. Postprocedure imaging demonstrates no hemorrhage or hematoma. Patient tolerated the procedure well without complication. Vital sign monitoring by nursing staff during the procedure will continue as patient is in the special procedures unit for post procedure observation. FINDINGS: The images document guide needle placement within the left retroperitoneal periaortic adenopathy. Post biopsy images demonstrate no hemorrhage or hematoma. COMPLICATIONS: None immediate. IMPRESSION: Successful CT-guided core biopsy of the left retroperitoneal periaortic adenopathy Electronically Signed   By: [name].  Shick M.D.   On: 05/11/2019 14:24   Dg Chest Port 1 View  Result Date: 06/03/2019 CLINICAL DATA:  Cough EXAM: PORTABLE CHEST 1 VIEW COMPARISON:  05/31/2019  FINDINGS: No significant change in AP portable examination with diffuse interstitial pulmonary opacity and probable small layering pleural effusions. No new airspace opacity. Cardiomegaly. IMPRESSION: 1. No significant change in AP portable examination  with diffuse interstitial pulmonary opacity and probable small layering pleural effusions. 2.  No new airspace opacity. Cardiomegaly. Electronically Signed   By: Eddie Candle M.D.   On: 06/03/2019 14:32   Dg Chest Port 1 View  Result Date: 05/31/2019 CLINICAL DATA:  Dyspnea. EXAM: PORTABLE CHEST 1 VIEW COMPARISON:  July 28, 2017. FINDINGS: Stable cardiomediastinal silhouette. Mild central pulmonary vascular congestion is noted. No pneumothorax is noted. Increased bibasilar opacities are noted concerning for worsening atelectasis or edema. Small pleural effusions are noted. Bony thorax is unremarkable. IMPRESSION: Mild central pulmonary vascular congestion is noted. Increased bibasilar opacities are noted concerning for worsening edema or atelectasis with small pleural effusions. Electronically Signed   By: Marijo Conception M.D.   On: 05/31/2019 13:12   Ct Renal Stone Study  Result Date: 05/25/2019 CLINICAL DATA:  Low back pain, generalized abdominal pain EXAM: CT ABDOMEN AND PELVIS WITHOUT CONTRAST TECHNIQUE: Multidetector CT imaging of the abdomen and pelvis was performed following the standard protocol without IV contrast. COMPARISON:  08/31/2018 FINDINGS: Lower chest: small bilateral pleural effusions, new since prior study. Small nodules in the right lower lobe are stable. Bibasilar atelectasis. Heart is normal size. Mediastinal adenopathy partially imaged on the upper images as seen on prior study. Heart is normal size. Hepatobiliary: No focal liver abnormality is seen. Status post cholecystectomy. No biliary dilatation. Pancreas: No focal abnormality or ductal dilatation. Spleen: Spleen mildly enlarged with a craniocaudal length of 13.9 cm. This is  stable when compared to prior study. No focal splenic abnormality. Adrenals/Urinary Tract: Left renal cyst measures 6 cm. No hydronephrosis. Adrenal glands and urinary bladder unremarkable. Stomach/Bowel: Appendix normal. Stomach, large and small bowel grossly unremarkable. Vascular/Lymphatic: Aortic atherosclerosis. No aneurysm. Extensive retroperitoneal adenopathy. This continues into the pelvis/iliac chains. This is also noted in the retrocrural region. This is unchanged since prior study. Reproductive: Mild prostate prominence. Other: No free fluid or free air. There is extensive stranding/soft tissue density noted in the upper retroperitoneum around the adrenal glands. This has progressed significantly since prior study and is of unknown etiology. Musculoskeletal: Lytic lesion within the T7 vertebral body is stable. Lytic lesion within the L2 vertebral body also stable. IMPRESSION: Extensive retroperitoneal adenopathy in the abdomen and pelvis. Adenopathy in the visualized lower mediastinum. This is unchanged since prior study and could reflect lymphoma or metastatic disease. Extensive stranding in the retroperitoneum in the upper abdomen around the adrenal glands. This is of unknown etiology, progressed since prior study. Small bilateral pleural effusions, new since prior study. Stable small lower lobe pulmonary nodules. Bibasilar atelectasis. Splenomegaly, stable. Aortic atherosclerosis. Stable lytic lesions in the T7 and L2 vertebral bodies. Electronically Signed   By: Rolm Baptise M.D.   On: 05/25/2019 00:04   Vas [place] Lower Extremity Venous (dvt)  Result Date: 05/30/2019  Lower Venous Study Indications: Edema.  Limitations: Body habitus and poor ultrasound/tissue interface. Comparison Study: No prior study Performing Technologist: Maudry Mayhew MHA, RDMS, RVT, RDCS  Examination Guidelines: A complete evaluation includes B-mode imaging, spectral Doppler, color Doppler, and power Doppler as needed of  all accessible portions of each vessel. Bilateral testing is considered an integral part of a complete examination. Limited examinations for reoccurring indications may be performed as noted.  +---------+---------------+---------+-----------+----------+--------------+ RIGHT    CompressibilityPhasicitySpontaneityPropertiesThrombus Aging +---------+---------------+---------+-----------+----------+--------------+ CFV      Full           Yes      Yes                                 +---------+---------------+---------+-----------+----------+--------------+  SFJ                                                   Not visualized +---------+---------------+---------+-----------+----------+--------------+ FV Prox  Full                                                        +---------+---------------+---------+-----------+----------+--------------+ FV Mid   Full                                                        +---------+---------------+---------+-----------+----------+--------------+ FV Distal                        Yes                                 +---------+---------------+---------+-----------+----------+--------------+ PFV      Full                                                        +---------+---------------+---------+-----------+----------+--------------+ POP      Full           Yes      Yes                                 +---------+---------------+---------+-----------+----------+--------------+ PTV                              Yes                                 +---------+---------------+---------+-----------+----------+--------------+ PERO                             Yes                                 +---------+---------------+---------+-----------+----------+--------------+ Calf veins not adequately visualized.  +---------+---------------+---------+-----------+----------+--------------+ LEFT      CompressibilityPhasicitySpontaneityPropertiesThrombus Aging +---------+---------------+---------+-----------+----------+--------------+ CFV      Full           Yes      Yes                                 +---------+---------------+---------+-----------+----------+--------------+ SFJ  Not visualized +---------+---------------+---------+-----------+----------+--------------+ FV Prox  Full                                                        +---------+---------------+---------+-----------+----------+--------------+ FV Mid   Full                                                        +---------+---------------+---------+-----------+----------+--------------+ FV Distal                        Yes                                 +---------+---------------+---------+-----------+----------+--------------+ POP      Full           Yes      Yes                                 +---------+---------------+---------+-----------+----------+--------------+ PTV                              Yes                                 +---------+---------------+---------+-----------+----------+--------------+ PERO                             Yes                                 +---------+---------------+---------+-----------+----------+--------------+ Calf veins not adequately visualized.    Summary: Right: There is no evidence of deep vein thrombosis in the lower extremity. However, portions of this examination were limited- see technologist comments above. No cystic structure found in the popliteal fossa. Left: There is no evidence of deep vein thrombosis in the lower extremity. However, portions of this examination were limited- see technologist comments above. No cystic structure found in the popliteal fossa.  *See table(s) above for measurements and observations. Electronically signed by Harold Barban MD on 05/30/2019 at  5:51:16 PM.    Final     Microbiology No results found for this or any previous visit (from the past 240 hour(s)).  Lab Basic Metabolic Panel: Recent Labs  Lab 06/01/19 0553 06/02/19 0426  06/03/19 0735 06/03/19 1243 06/03/19 1448  06/04/19 0556  06/04/19 1847 06/04/19 2209 06/05/19 0200 06/05/19 0222 06/05/19 1424  NA 128* 128*   < > 128* 126* 126*  --  128*  --   --   --   --  130*  --   K 5.5* 5.4*   < > 5.9* 7.0* 6.1*   < > 6.1*   < > 5.7* 5.6* 5.5* 5.4* 5.3*  CL 89* 89*   < > 89* 89* 88*  --  88*  --   --   --   --  88*  --   CO2 30 29   < > 30 28 31   --  31  --   --   --   --  32  --   GLUCOSE 127* 125*   < > 139* 177* 196*  --  118*  --   --   --   --  149*  --   BUN 43* 46*   < > 54* 57* 54*  --  60*  --   --   --   --  68*  --   CREATININE 1.45* 1.45*   < > 1.62* 1.63* 1.61*  --  1.57*  --   --   --   --  1.62*  --   CALCIUM 9.2 9.2   < > 9.1 9.0 9.2  --  9.4  --   --   --   --  9.0  --   MG 2.7* 2.5*  --   --   --   --   --   --   --   --   --   --   --   --    < > = values in this interval not displayed.   Liver Function Tests: No results for input(s): AST, ALT, ALKPHOS, BILITOT, PROT, ALBUMIN in the last 168 hours. No results for input(s): LIPASE, AMYLASE in the last 168 hours. No results for input(s): AMMONIA in the last 168 hours. CBC: Recent Labs  Lab 05/31/19 0357 06/01/19 0553 06/02/19 0426 06/04/19 0556  WBC 7.6 7.8 8.3 6.6  HGB 12.4* 12.4* 12.4* 11.8*  HCT 39.4 39.4 39.3 36.7*  MCV 91.4 90.8 91.0 90.4  PLT 164 159 179 149*   Cardiac Enzymes: No results for input(s): CKTOTAL, CKMB, CKMBINDEX, TROPONINI in the last 168 hours. Sepsis Labs: Recent Labs  Lab 05/31/19 0357 06/01/19 0553 06/02/19 0426 06/04/19 0556  WBC 7.6 7.8 8.3 6.6    Procedures/Operations  Completed 5 out of 10 palliative radiation treatments to spine    I called the patient's son to express my condolences  Harold Hedge 2019/06/13, 8:44 AM

## 2019-06-20 DEATH — deceased

## 2019-08-14 ENCOUNTER — Ambulatory Visit: Payer: Medicare Other | Admitting: Internal Medicine
